# Patient Record
Sex: Female | Born: 1940 | Race: White | Hispanic: No | Marital: Married | Smoking: Former smoker
Health system: Southern US, Community
[De-identification: ages and names within clinical notes are randomized; demographics above are authoritative.]

## PROBLEM LIST (undated history)

## (undated) DIAGNOSIS — I509 Heart failure, unspecified: Secondary | ICD-10-CM

## (undated) DIAGNOSIS — M791 Myalgia, unspecified site: Secondary | ICD-10-CM

## (undated) DIAGNOSIS — D649 Anemia, unspecified: Secondary | ICD-10-CM

## (undated) DIAGNOSIS — I499 Cardiac arrhythmia, unspecified: Secondary | ICD-10-CM

## (undated) DIAGNOSIS — F329 Major depressive disorder, single episode, unspecified: Secondary | ICD-10-CM

## (undated) DIAGNOSIS — R609 Edema, unspecified: Secondary | ICD-10-CM

## (undated) DIAGNOSIS — N189 Chronic kidney disease, unspecified: Secondary | ICD-10-CM

## (undated) DIAGNOSIS — F32A Depression, unspecified: Secondary | ICD-10-CM

## (undated) DIAGNOSIS — Z87448 Personal history of other diseases of urinary system: Secondary | ICD-10-CM

## (undated) DIAGNOSIS — IMO0001 Reserved for inherently not codable concepts without codable children: Secondary | ICD-10-CM

## (undated) DIAGNOSIS — M810 Age-related osteoporosis without current pathological fracture: Secondary | ICD-10-CM

## (undated) DIAGNOSIS — K219 Gastro-esophageal reflux disease without esophagitis: Secondary | ICD-10-CM

## (undated) DIAGNOSIS — I10 Essential (primary) hypertension: Secondary | ICD-10-CM

## (undated) DIAGNOSIS — R413 Other amnesia: Secondary | ICD-10-CM

## (undated) DIAGNOSIS — R0981 Nasal congestion: Secondary | ICD-10-CM

## (undated) HISTORY — DX: Anemia, unspecified: D64.9

## (undated) HISTORY — DX: Nasal congestion: R09.81

## (undated) HISTORY — DX: Cardiac arrhythmia, unspecified: I49.9

## (undated) HISTORY — PX: OTHER SURGICAL HISTORY: SHX169

## (undated) HISTORY — DX: Depression, unspecified: F32.A

## (undated) HISTORY — DX: Major depressive disorder, single episode, unspecified: F32.9

## (undated) HISTORY — DX: Reserved for inherently not codable concepts without codable children: IMO0001

## (undated) HISTORY — DX: Personal history of other diseases of urinary system: Z87.448

## (undated) HISTORY — DX: Gastro-esophageal reflux disease without esophagitis: K21.9

## (undated) HISTORY — DX: Heart failure, unspecified: I50.9

## (undated) HISTORY — DX: Age-related osteoporosis without current pathological fracture: M81.0

## (undated) HISTORY — DX: Essential (primary) hypertension: I10

## (undated) HISTORY — DX: Myalgia, unspecified site: M79.10

## (undated) HISTORY — DX: Chronic kidney disease, unspecified: N18.9

## (undated) HISTORY — DX: Other amnesia: R41.3

## (undated) HISTORY — DX: Edema, unspecified: R60.9

## (undated) HISTORY — PX: KNEE SURGERY: SHX244

---

## 2008-05-29 ENCOUNTER — Encounter: Admission: RE | Admit: 2008-05-29 | Discharge: 2008-05-29 | Payer: Self-pay | Admitting: Otolaryngology

## 2008-05-30 ENCOUNTER — Other Ambulatory Visit: Admission: RE | Admit: 2008-05-30 | Discharge: 2008-05-30 | Payer: Self-pay | Admitting: Otolaryngology

## 2008-08-08 ENCOUNTER — Encounter (INDEPENDENT_AMBULATORY_CARE_PROVIDER_SITE_OTHER): Payer: Self-pay | Admitting: Otolaryngology

## 2008-08-08 ENCOUNTER — Ambulatory Visit (HOSPITAL_COMMUNITY): Admission: RE | Admit: 2008-08-08 | Discharge: 2008-08-09 | Payer: Self-pay | Admitting: Otolaryngology

## 2010-06-17 LAB — APTT: aPTT: 41 seconds — ABNORMAL HIGH (ref 24–37)

## 2010-06-17 LAB — PROTIME-INR: Prothrombin Time: 12.9 seconds (ref 11.6–15.2)

## 2010-06-17 LAB — CBC
Platelets: 250 10*3/uL (ref 150–400)
RDW: 13.2 % (ref 11.5–15.5)
WBC: 8.1 10*3/uL (ref 4.0–10.5)

## 2010-06-17 LAB — BASIC METABOLIC PANEL
BUN: 14 mg/dL (ref 6–23)
Calcium: 9.7 mg/dL (ref 8.4–10.5)
Creatinine, Ser: 0.77 mg/dL (ref 0.4–1.2)
GFR calc non Af Amer: >60 mL/min (ref >60)

## 2010-07-22 NOTE — Op Note (Signed)
NAME:  Rebecca Gilmore, Rebecca Gilmore NO.:  [id number]   MEDICAL RECORD NO.:  [id number]          PATIENT TYPE:  OIB   LOCATION:  4128                         FACILITY:  [facility]   PHYSICIAN:  Berneice Heinrich, M.D., F.A.C.S.DATE OF BIRTH:  [birth year]   DATE OF PROCEDURE:  08/08/2008  DATE OF DISCHARGE:  08/09/2008                               OPERATIVE REPORT   PREOPERATIVE DIAGNOSIS:  Right parotid mass, ICD-9 is 784.2.   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   PROCEDURE:  Right superficial parotidectomy with facial nerve dissection  and nerve integrity monitoring, 424.15.   SURGEON:  Silvestre Moment, MD   ANESTHESIA:  General endotracheal anesthesia.   ESTIMATED BLOOD LOSS:  Less than 50 mL.   SPECIMENS:  Right superficial parotid gland with frozen section.   COMPLICATIONS:  None.   INDICATIONS:  This patient is a 70 year old female with a 17-year  history of a progressively enlarging right parotid tail mass.  Fine  needle aspiration performed on May 30, 2008, was consistent with a  pleomorphic adenoma.  The patient also underwent a CT scan with contrast  on May 29, 2008.  This revealed findings of a large right parotid  tumor with primarily superficial by involvement, but some possible deep  lobe involvement as well.  The mass was approximately 3 cm.  No  lymphadenopathy is associated.  No signs of malignancy.  Facial nerve  function is intact.  Moderate pain is associated and there is some  facial enlargement which is causing asymmetry which concerns the patient  as well.  For these reasons, right parotidectomy is performed today.   FINDINGS:  The patient had friable large tumor overlying primarily the  inferior trunk of the facial nerve.  The facial nerve did split the  tumor.  There was some dissection on the inferior surface of the facial  nerve.  There was a small amount of the parotid that required dissection  of superior trunk as  well.  All branches stimulated at the end of the  case was 0.5 mA of current.  The frozen section was returned as  pleomorphic adenoma.   PROCEDURE:  The patient was taken to the operating room and placed on  the table in the supine position.  She was then placed under general  endotracheal anesthesia and the table rotated counterclockwise 90  degrees.  The nerve integrity monitor was applied as per standard  fashion.  Resistances were within expected levels.  The right side of  face was prepped and draped in the usual sterile fashion.  Lidocaine 1%  with 1:100,000 epinephrine was then used to inject the planned area of  incision.  After allowing time for vasoconstrictive effect, right  modified Blair incision was made with a small 2-cm limb following  natural skin crease approximately 2 fingerbreadths inferior to the lower  border of the mandible.  At this point, #15 blade was then used to  follow the planned incision.  SMAS flap was then elevated using Bovie  cautery and thyroid scissors.  The anterior flap was then secured in a  simple interrupted fashion using 0 silk suture.  Parotid gland was then  dissected free off of the tragal and ear cartilage using Bovie cautery  until approaching the facial nerve.  The ear and ear lobe and skin was  then again retracted posteriorly and sutured with 0 silk suture.  The  facial nerve was then carefully identified at the tympanomastoid suture  line using this as a landmark.  Bipolar cautery was used to cauterize  vessels in this area.  The McCabe dissecting forceps were then used to  elevate all of the facial nerve and carefully tract the inferior and  superior main trunk with bipolar cautery used to divide the parotid  gland superficial to this.  Successive division of parotid gland was  performed with all branches of the facial nerve being elevated in this  manner and protected in entirety.  There were a few fiber branches of  the nerve that  could not be dissected without damaging.  All other nerve  branches were protected in entirety.  The Stensen duct was identified  and tied off using 0 silk suture.  The majority of the tumor was along  the marginal mandibular nerve which did split the tumor.  It was  carefully elevated from an inferior to superior manner and dissected off  and taken off in its entirety from the lower deep lobe of the parotid  gland.  The main trunk was then stimulated using 0.5 mA of current with  all branches stimulating equally.  Please note that during the  dissection prior to the identification of the main trunk, the  sternocleidomastoid muscle was divided from the attachments of the  inferior border of the parotid gland, then continuing in that manner,  the digastric muscle was identified and dissected from the inferior  border of the parotid gland as well.  This then opened up a large plane  of dissection for safely dissecting out the facial nerve trunk.  The  nerve integrity monitor was used to ensure careful dissection in the  small branches particularly in the marginal mandibular nerve region.  Otherwise, the main trunk was easily visible and the stimulator was used  at the final prior to closure of the skin flap.  A #7 drain was placed  deep in the parotid bed and brought out through a separate stab incision  in the posterior skin.  This was tied using a pursestring fashion with 3-  0 silk suture.  The subcutaneous tissues were reapproximated in a simple  interrupted buried fashion using 3-0 Vicryl.  The skin was closed in a  running subcuticular fashion using 4-0 Vicryl.  Bacitracin ointment was  applied.  The drain was connected to suction.  The table was rotated  clockwise 90 degrees to its original position.  The patient was awakened  from anesthesia and taken to the postanesthesia care unit in stable  condition.  There were no complications.   Note, prior to closure of the wound, frozen  section was returned as  pleomorphic adenoma, and for this reason, no dissection of the deep lobe  was required and the wound was then closed.  Please note the copious  irrigation of the dissected bed was performed prior to closure.      Berneice Heinrich, M.D., F.A.C.S.  Electronically Signed     SJ/MEDQ  D:  08/19/2008  T:  08/20/2008  Job:  [id number]   cc:   Daiva Eves, MD  Berneice Heinrich, M.D., F.A.C.S.

## 2012-05-11 ENCOUNTER — Ambulatory Visit: Payer: Self-pay | Admitting: Ophthalmology

## 2012-05-11 LAB — POTASSIUM: Potassium: 3.8 mmol/L (ref 3.5–5.1)

## 2012-05-17 ENCOUNTER — Ambulatory Visit: Payer: Self-pay | Admitting: Ophthalmology

## 2012-06-06 ENCOUNTER — Ambulatory Visit: Payer: Self-pay | Admitting: Ophthalmology

## 2012-06-06 LAB — POTASSIUM: Potassium: 3.8 mmol/L (ref 3.5–5.1)

## 2012-06-15 ENCOUNTER — Ambulatory Visit: Payer: Self-pay | Admitting: Ophthalmology

## 2012-12-06 ENCOUNTER — Encounter: Payer: Self-pay | Admitting: *Deleted

## 2012-12-08 ENCOUNTER — Encounter: Payer: Self-pay | Admitting: *Deleted

## 2012-12-12 ENCOUNTER — Encounter: Payer: Self-pay | Admitting: Podiatry

## 2012-12-12 ENCOUNTER — Ambulatory Visit (INDEPENDENT_AMBULATORY_CARE_PROVIDER_SITE_OTHER): Payer: Medicare Other | Admitting: Podiatry

## 2012-12-12 VITALS — BP 115/58 | HR 66 | Temp 98.0°F | Resp 16 | Ht 64.0 in | Wt 239.2 lb

## 2012-12-12 DIAGNOSIS — M722 Plantar fascial fibromatosis: Secondary | ICD-10-CM | POA: Insufficient documentation

## 2012-12-12 MED ORDER — DEXAMETHASONE SOD PHOSPHATE PF 10 MG/ML IJ SOLN: 2.0000 mg | Freq: Once | INTRAMUSCULAR | Status: DC

## 2012-12-12 MED ORDER — TRIAMCINOLONE ACETONIDE 40 MG/ML IJ SUSP: 20.0000 mg | Freq: Once | INTRAMUSCULAR | Status: DC

## 2012-12-12 NOTE — Patient Instructions (Addendum)
Plantar Fasciitis (Heel Spur Syndrome) with Rehab The plantar fascia is a fibrous, ligament-like, soft-tissue structure that spans the bottom of the foot. Plantar fasciitis is a condition that causes pain in the foot due to inflammation of the tissue. SYMPTOMS   Pain and tenderness on the underneath side of the foot.  Pain that worsens with standing or walking. CAUSES  Plantar fasciitis is caused by irritation and injury to the plantar fascia on the underneath side of the foot. Common mechanisms of injury include:  Direct trauma to bottom of the foot.  Damage to a small nerve that runs under the foot where the main fascia attaches to the heel bone.  Stress placed on the plantar fascia due to bone spurs. RISK INCREASES WITH:   Activities that place stress on the plantar fascia (running, jumping, pivoting, or cutting).  Poor strength and flexibility.  Improperly fitted shoes.  Tight calf muscles.  Flat feet.  Failure to warm-up properly before activity.  Obesity. PREVENTION  Warm up and stretch properly before activity.  Allow for adequate recovery between workouts.  Maintain physical fitness:  Strength, flexibility, and endurance.  Cardiovascular fitness.  Maintain a health body weight.  Avoid stress on the plantar fascia.  Wear properly fitted shoes, including arch supports for individuals who have flat feet. PROGNOSIS  If treated properly, then the symptoms of plantar fasciitis usually resolve without surgery. However, occasionally surgery is necessary. RELATED COMPLICATIONS   Recurrent symptoms that may result in a chronic condition.  Problems of the lower back that are caused by compensating for the injury, such as limping.  Pain or weakness of the foot during push-off following surgery.  Chronic inflammation, scarring, and partial or complete fascia tear, occurring more often from repeated injections. TREATMENT  Treatment initially involves the use of  ice and medication to help reduce pain and inflammation. The use of strengthening and stretching exercises may help reduce pain with activity, especially stretches of the Achilles tendon. These exercises may be performed at home or with a therapist. Your caregiver may recommend that you use heel cups of arch supports to help reduce stress on the plantar fascia. Occasionally, corticosteroid injections are given to reduce inflammation. If symptoms persist for greater than 6 months despite non-surgical (conservative), then surgery may be recommended.  MEDICATION   If pain medication is necessary, then nonsteroidal anti-inflammatory medications, such as aspirin and ibuprofen, or other minor pain relievers, such as acetaminophen, are often recommended.  Do not take pain medication within 7 days before surgery.  Prescription pain relievers may be given if deemed necessary by your caregiver. Use only as directed and only as much as you need.  Corticosteroid injections may be given by your caregiver. These injections should be reserved for the most serious cases, because they may only be given a certain number of times. HEAT AND COLD  Cold treatment (icing) relieves pain and reduces inflammation. Cold treatment should be applied for 10 to 15 minutes every 2 to 3 hours for inflammation and pain and immediately after any activity that aggravates your symptoms. Use ice packs or massage the area with a piece of ice (ice massage).  Heat treatment may be used prior to performing the stretching and strengthening activities prescribed by your caregiver, physical therapist, or athletic trainer. Use a heat pack or soak the injury in warm water. SEEK IMMEDIATE MEDICAL CARE IF:  Treatment seems to offer no benefit, or the condition worsens.  Any medications produce adverse side effects. EXERCISES RANGE  OF MOTION (ROM) AND STRETCHING EXERCISES - Plantar Fasciitis (Heel Spur Syndrome) These exercises may help you  when beginning to rehabilitate your injury. Your symptoms may resolve with or without further involvement from your physician, physical therapist or athletic trainer. While completing these exercises, remember:   Restoring tissue flexibility helps normal motion to return to the joints. This allows healthier, less painful movement and activity.  An effective stretch should be held for at least 30 seconds.  A stretch should never be painful. You should only feel a gentle lengthening or release in the stretched tissue. RANGE OF MOTION - Toe Extension, Flexion  Sit with your right / left leg crossed over your opposite knee.  Grasp your toes and gently pull them back toward the top of your foot. You should feel a stretch on the bottom of your toes and/or foot.  Hold this stretch for __________ seconds.  Now, gently pull your toes toward the bottom of your foot. You should feel a stretch on the top of your toes and or foot.  Hold this stretch for __________ seconds. Repeat __________ times. Complete this stretch __________ times per day.  RANGE OF MOTION - Ankle Dorsiflexion, Active Assisted  Remove shoes and sit on a chair that is preferably not on a carpeted surface.  Place right / left foot under knee. Extend your opposite leg for support.  Keeping your heel down, slide your right / left foot back toward the chair until you feel a stretch at your ankle or calf. If you do not feel a stretch, slide your bottom forward to the edge of the chair, while still keeping your heel down.  Hold this stretch for __________ seconds. Repeat __________ times. Complete this stretch __________ times per day.  STRETCH  Gastroc, Standing  Place hands on wall.  Extend right / left leg, keeping the front knee somewhat bent.  Slightly point your toes inward on your back foot.  Keeping your right / left heel on the floor and your knee straight, shift your weight toward the wall, not allowing your back to  arch.  You should feel a gentle stretch in the right / left calf. Hold this position for __________ seconds. Repeat __________ times. Complete this stretch __________ times per day. STRETCH  Soleus, Standing  Place hands on wall.  Extend right / left leg, keeping the other knee somewhat bent.  Slightly point your toes inward on your back foot.  Keep your right / left heel on the floor, bend your back knee, and slightly shift your weight over the back leg so that you feel a gentle stretch deep in your back calf.  Hold this position for __________ seconds. Repeat __________ times. Complete this stretch __________ times per day. STRETCH  Gastrocsoleus, Standing  Note: This exercise can place a lot of stress on your foot and ankle. Please complete this exercise only if specifically instructed by your caregiver.   Place the ball of your right / left foot on a step, keeping your other foot firmly on the same step.  Hold on to the wall or a rail for balance.  Slowly lift your other foot, allowing your body weight to press your heel down over the edge of the step.  You should feel a stretch in your right / left calf.  Hold this position for __________ seconds.  Repeat this exercise with a slight bend in your right / left knee. Repeat __________ times. Complete this stretch __________ times per day.  STRENGTHENING EXERCISES - Plantar Fasciitis (Heel Spur Syndrome)  These exercises may help you when beginning to rehabilitate your injury. They may resolve your symptoms with or without further involvement from your physician, physical therapist or athletic trainer. While completing these exercises, remember:   Muscles can gain both the endurance and the strength needed for everyday activities through controlled exercises.  Complete these exercises as instructed by your physician, physical therapist or athletic trainer. Progress the resistance and repetitions only as guided. STRENGTH - Towel  Curls  Sit in a chair positioned on a non-carpeted surface.  Place your foot on a towel, keeping your heel on the floor.  Pull the towel toward your heel by only curling your toes. Keep your heel on the floor.  If instructed by your physician, physical therapist or athletic trainer, add ____________________ at the end of the towel. Repeat __________ times. Complete this exercise __________ times per day. STRENGTH - Ankle Inversion  Secure one end of a rubber exercise band/tubing to a fixed object (table, pole). Loop the other end around your foot just before your toes.  Place your fists between your knees. This will focus your strengthening at your ankle.  Slowly, pull your big toe up and in, making sure the band/tubing is positioned to resist the entire motion.  Hold this position for __________ seconds.  Have your muscles resist the band/tubing as it slowly pulls your foot back to the starting position. Repeat __________ times. Complete this exercises __________ times per day.  Document Released: 02/23/2005 Document Revised: 05/18/2011 Document Reviewed: 06/07/2008 [facility] Patient Information 2014 [place], [place]. Plantar Fasciitis Plantar fasciitis is a common condition that causes foot pain. It is soreness (inflammation) of the band of tough fibrous tissue on the bottom of the foot that runs from the heel bone (calcaneus) to the ball of the foot. The cause of this soreness may be from excessive standing, poor fitting shoes, running on hard surfaces, being overweight, having an abnormal walk, or overuse (this is common in runners) of the painful foot or feet. It is also common in aerobic exercise dancers and ballet dancers. SYMPTOMS  Most people with plantar fasciitis complain of:  Severe pain in the morning on the bottom of their foot especially when taking the first steps out of bed. This pain recedes after a few minutes of walking.  Severe pain is experienced also during walking  following a long period of inactivity.  Pain is worse when walking barefoot or up stairs DIAGNOSIS   Your caregiver will diagnose this condition by examining and feeling your foot.  Special tests such as X-rays of your foot, are usually not needed. PREVENTION   Consult a sports medicine professional before beginning a new exercise program.  Walking programs offer a good workout. With walking there is a lower chance of overuse injuries common to runners. There is less impact and less jarring of the joints.  Begin all new exercise programs slowly. If problems or pain develop, decrease the amount of time or distance until you are at a comfortable level.  Wear good shoes and replace them regularly.  Stretch your foot and the heel cords at the back of the ankle (Achilles tendon) both before and after exercise.  Run or exercise on even surfaces that are not hard. For example, asphalt is better than pavement.  Do not run barefoot on hard surfaces.  If using a treadmill, vary the incline.  Do not continue to workout if you have foot or joint  problems. Seek professional help if they do not improve. HOME CARE INSTRUCTIONS   Avoid activities that cause you pain until you recover.  Use ice or cold packs on the problem or painful areas after working out.  Only take over-the-counter or prescription medicines for pain, discomfort, or fever as directed by your caregiver.  Soft shoe inserts or athletic shoes with air or gel sole cushions may be helpful.  If problems continue or become more severe, consult a sports medicine caregiver or your own health care provider. Cortisone is a potent anti-inflammatory medication that may be injected into the painful area. You can discuss this treatment with your caregiver. MAKE SURE YOU:   Understand these instructions.  Will watch your condition.  Will get help right away if you are not doing well or get worse. Document Released: 11/18/2000 Document  Revised: 05/18/2011 Document Reviewed: 01/18/2008 [facility] Patient Information 2014 [place], [place].

## 2012-12-12 NOTE — Progress Notes (Signed)
Rebecca Gilmore presents today with a chief complaint of pain to her forefoot right and her lateral foot right. States that she is also recently noted some pain in her right heel. Care recall how long it's been going on. States it is painful when she stands up first thing in the mornings. She is also complaining of painful area beneath the fourth metatarsal of her right foot.  Objective: I reviewed her past history medications allergies. Her pulses are strongly palpable to the bilateral lower extremity with mild edema. She has a plantar flexed fourth metatarsal of the right foot resulting in her metatarsalgia. A lateral metatarsalgia is more than likely compensatory to plantar fasciitis. He does have pain on palpation to the medial calcaneal tubercle of the right heel.  Assessment: Plantar fasciitis with lateral compensatory syndrome. She also has a fourth plantarflexed metatarsal.  Plan: At this point I injected her right heel with me milligrams of Kenalog and 2 mg of dexamethasone to the point of maximal tenderness right heel. I will followup with her in 2 months.

## 2013-02-13 ENCOUNTER — Ambulatory Visit (INDEPENDENT_AMBULATORY_CARE_PROVIDER_SITE_OTHER): Payer: Medicare Other | Admitting: Podiatry

## 2013-02-13 ENCOUNTER — Encounter: Payer: Self-pay | Admitting: Podiatry

## 2013-02-13 VITALS — BP 134/51 | HR 61 | Resp 16

## 2013-02-13 DIAGNOSIS — M7750 Other enthesopathy of unspecified foot: Secondary | ICD-10-CM

## 2013-02-13 NOTE — Progress Notes (Signed)
Rebecca Gilmore presents today with a chief complaint of a painful subtalar joint of the right foot she states this been hurting everyday until today. She states has been staying swollen and painful but she refuses to wear her compression hose.  Objective: Vital signs are stable she is alert and oriented x3. Pulses are strong bilateral. She has pain on range of motion of the subtalar joint of the right foot. Pain on palpation of the sinus tarsi right foot.  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  Plan: Injected the subtalar joint today with 20 mg of local anesthetic and Kenalog.

## 2013-04-17 ENCOUNTER — Ambulatory Visit: Payer: Medicare Other | Admitting: Podiatry

## 2014-01-24 ENCOUNTER — Ambulatory Visit (INDEPENDENT_AMBULATORY_CARE_PROVIDER_SITE_OTHER): Payer: Medicare Other | Admitting: Podiatry

## 2014-01-24 VITALS — BP 134/66 | HR 71 | Resp 16

## 2014-01-24 DIAGNOSIS — M7752 Other enthesopathy of left foot: Secondary | ICD-10-CM

## 2014-01-24 DIAGNOSIS — M779 Enthesopathy, unspecified: Principal | ICD-10-CM

## 2014-01-24 DIAGNOSIS — M7751 Other enthesopathy of right foot: Secondary | ICD-10-CM

## 2014-01-24 DIAGNOSIS — M778 Other enthesopathies, not elsewhere classified: Secondary | ICD-10-CM

## 2014-01-24 NOTE — Progress Notes (Signed)
She presents today complaining of painful subtalar joints bilateral.  Objective: She has pain on palpation sinus tarsi and subtalar joint range of motion bilateral foot.  Assessment: Subtalar joint capsulitis bilateral.  Plan: Injected Kenalog and local anesthetic to the bilateral subtalar joints.

## 2014-02-05 ENCOUNTER — Ambulatory Visit: Payer: Medicare Other | Admitting: Podiatry

## 2014-05-08 DIAGNOSIS — M25551 Pain in right hip: Secondary | ICD-10-CM | POA: Diagnosis not present

## 2014-05-08 DIAGNOSIS — M1711 Unilateral primary osteoarthritis, right knee: Secondary | ICD-10-CM | POA: Diagnosis not present

## 2014-06-29 NOTE — Op Note (Signed)
PATIENT NAME:  Rebecca Gilmore, SAVANNAH MR#:  [id number] DATE OF BIRTH:  [birth year]  DATE OF PROCEDURE:  05/17/2012  PREOPERATIVE DIAGNOSIS: Visually significant cataract of the right eye.   POSTOPERATIVE DIAGNOSIS: Visually significant cataract of the right eye.   OPERATIVE PROCEDURE: Cataract extraction by phacoemulsification with implant of intraocular lens to right eye.   SURGEON: Birder Robson, MD.   ANESTHESIA:  1. Managed anesthesia care.  2. Topical tetracaine drops followed by 2% Xylocaine jelly applied in the preoperative holding area.   COMPLICATIONS: None.   TECHNIQUE:  Stop and chop.   DESCRIPTION OF PROCEDURE: The patient was examined and consented in the preoperative holding area where the aforementioned topical anesthesia was applied to the right eye and then brought back to the Operating Room where the right eye was prepped and draped in the usual sterile ophthalmic fashion and a lid speculum was placed. A paracentesis was created with the side port blade and the anterior chamber was filled with viscoelastic. A near clear corneal incision was performed with the steel keratome. A continuous curvilinear capsulorrhexis was performed with a cystotome followed by the capsulorrhexis forceps. Hydrodissection and hydrodelineation were carried out with BSS on a blunt cannula. The lens was removed in a stop and chop technique and the remaining cortical material was removed with the irrigation-aspiration handpiece. The capsular bag was inflated with viscoelastic and the Tecnis ZCB00 23.5 -diopter lens, serial #[id number] was placed in the capsular bag without complication. The remaining viscoelastic was removed from the eye with the irrigation-aspiration handpiece. The wounds were hydrated. The anterior chamber was flushed with Miostat and the eye was inflated to physiologic pressure. 0.1 mL of cefuroxime concentration 10 mg/mL was placed in the anterior chamber. The wounds were found to be water  tight. The eye was dressed with Vigamox. The patient was given protective glasses to wear throughout the day and a shield with which to sleep tonight. The patient was also given drops with which to begin a drop regimen today and will follow-up with me in one day.      ____________________________ Livingston Diones. Porfilio, MD wlp:cc D: 05/17/2012 21:53:06 ET T: 05/17/2012 23:24:02 ET JOB#: [id number]  cc: William L. Porfilio, MD, <Dictator> [name] MD ELECTRONICALLY SIGNED 05/20/2012 17:10

## 2014-06-29 NOTE — Op Note (Signed)
PATIENT NAME:  Rebecca Gilmore, Rebecca Gilmore MR#:  [id number] DATE OF BIRTH:  April 06, 1940  DATE OF PROCEDURE:  06/15/2012  PREOPERATIVE DIAGNOSIS: Visually significant cataract of the left eye.   POSTOPERATIVE DIAGNOSIS: Visually significant cataract of the left eye.   OPERATIVE PROCEDURE: Cataract extraction by phacoemulsification with implant of intraocular lens to left eye.   SURGEON: Birder Robson, MD.   ANESTHESIA:  1. Managed anesthesia care.  2. Topical tetracaine drops followed by 2% Xylocaine jelly applied in the preoperative holding area.   COMPLICATIONS: None.   TECHNIQUE:  Stop and chop.   DESCRIPTION OF PROCEDURE: The patient was examined and consented in the preoperative holding area where the aforementioned topical anesthesia was applied to the left eye and then brought back to the Operating Room where the left eye was prepped and draped in the usual sterile ophthalmic fashion and a lid speculum was placed. A paracentesis was created with the side port blade and the anterior chamber was filled with viscoelastic. A near clear corneal incision was performed with the steel keratome. A continuous curvilinear capsulorrhexis was performed with a cystotome followed by the capsulorrhexis forceps. Hydrodissection and hydrodelineation were carried out with BSS on a blunt cannula. The lens was removed in a stop and chop technique and the remaining cortical material was removed with the irrigation-aspiration handpiece. The capsular bag was inflated with viscoelastic and the Tecnis ZCB00 24.5-diopter lens, serial number [id number] was placed in the capsular bag without complication. The remaining viscoelastic was removed from the eye with the irrigation-aspiration handpiece. The wounds were hydrated. The anterior chamber was flushed with Miostat and the eye was inflated to physiologic pressure. 0.1 mL of cefuroxime concentration 10 mg/mL was placed in the anterior chamber. The wounds were found to be water  tight. The eye was dressed with Vigamox and Combigan. The patient was given protective glasses to wear throughout the day and a shield with which to sleep tonight. The patient was also given drops with which to begin a drop regimen today and will follow-up with me in one day.    ____________________________ Livingston Diones. Kwesi Sangha, MD wlp:sb D: 06/15/2012 11:10:27 ET T: 06/15/2012 11:20:37 ET JOB#: [id number]  cc: Blanche Gallien L. Graclynn Vanantwerp, MD, <Dictator> Livingston Diones Dennies Coate MD ELECTRONICALLY SIGNED 06/21/2012 12:51

## 2014-07-10 DIAGNOSIS — Z1389 Encounter for screening for other disorder: Secondary | ICD-10-CM | POA: Diagnosis not present

## 2014-07-10 DIAGNOSIS — E782 Mixed hyperlipidemia: Secondary | ICD-10-CM | POA: Diagnosis not present

## 2014-07-10 DIAGNOSIS — I10 Essential (primary) hypertension: Secondary | ICD-10-CM | POA: Diagnosis not present

## 2014-07-10 DIAGNOSIS — E559 Vitamin D deficiency, unspecified: Secondary | ICD-10-CM | POA: Diagnosis not present

## 2014-07-10 DIAGNOSIS — N183 Chronic kidney disease, stage 3 (moderate): Secondary | ICD-10-CM | POA: Diagnosis not present

## 2014-07-10 DIAGNOSIS — R7309 Other abnormal glucose: Secondary | ICD-10-CM | POA: Diagnosis not present

## 2014-07-10 DIAGNOSIS — Z9181 History of falling: Secondary | ICD-10-CM | POA: Diagnosis not present

## 2014-08-14 DIAGNOSIS — S83206A Unspecified tear of unspecified meniscus, current injury, right knee, initial encounter: Secondary | ICD-10-CM | POA: Diagnosis not present

## 2014-08-14 DIAGNOSIS — M25561 Pain in right knee: Secondary | ICD-10-CM | POA: Diagnosis not present

## 2014-08-15 ENCOUNTER — Other Ambulatory Visit: Payer: Self-pay | Admitting: Orthopedic Surgery

## 2014-08-15 DIAGNOSIS — M25561 Pain in right knee: Secondary | ICD-10-CM

## 2014-08-26 ENCOUNTER — Ambulatory Visit
Admission: RE | Admit: 2014-08-26 | Discharge: 2014-08-26 | Disposition: A | Discharge status: Home / Self Care | Payer: Medicare Other | Source: Ambulatory Visit | Attending: Orthopedic Surgery | Admitting: Orthopedic Surgery

## 2014-08-26 DIAGNOSIS — M25561 Pain in right knee: Secondary | ICD-10-CM

## 2014-08-26 DIAGNOSIS — M1711 Unilateral primary osteoarthritis, right knee: Secondary | ICD-10-CM | POA: Diagnosis not present

## 2014-08-26 DIAGNOSIS — M23341 Other meniscus derangements, anterior horn of lateral meniscus, right knee: Secondary | ICD-10-CM | POA: Diagnosis not present

## 2014-08-26 DIAGNOSIS — M23321 Other meniscus derangements, posterior horn of medial meniscus, right knee: Secondary | ICD-10-CM | POA: Diagnosis not present

## 2014-09-26 DIAGNOSIS — S83231A Complex tear of medial meniscus, current injury, right knee, initial encounter: Secondary | ICD-10-CM | POA: Diagnosis not present

## 2014-09-26 DIAGNOSIS — M23341 Other meniscus derangements, anterior horn of lateral meniscus, right knee: Secondary | ICD-10-CM | POA: Diagnosis not present

## 2014-09-26 DIAGNOSIS — G8918 Other acute postprocedural pain: Secondary | ICD-10-CM | POA: Diagnosis not present

## 2014-09-26 DIAGNOSIS — M94261 Chondromalacia, right knee: Secondary | ICD-10-CM | POA: Diagnosis not present

## 2014-09-26 DIAGNOSIS — M6751 Plica syndrome, right knee: Secondary | ICD-10-CM | POA: Diagnosis not present

## 2014-09-26 DIAGNOSIS — M2241 Chondromalacia patellae, right knee: Secondary | ICD-10-CM | POA: Diagnosis not present

## 2014-09-26 DIAGNOSIS — S83251A Bucket-handle tear of lateral meniscus, current injury, right knee, initial encounter: Secondary | ICD-10-CM | POA: Diagnosis not present

## 2014-09-26 DIAGNOSIS — Y929 Unspecified place or not applicable: Secondary | ICD-10-CM | POA: Diagnosis not present

## 2014-09-26 DIAGNOSIS — M23321 Other meniscus derangements, posterior horn of medial meniscus, right knee: Secondary | ICD-10-CM | POA: Diagnosis not present

## 2014-09-26 DIAGNOSIS — X58XXXA Exposure to other specified factors, initial encounter: Secondary | ICD-10-CM | POA: Diagnosis not present

## 2014-10-02 DIAGNOSIS — R262 Difficulty in walking, not elsewhere classified: Secondary | ICD-10-CM | POA: Diagnosis not present

## 2014-10-02 DIAGNOSIS — M25661 Stiffness of right knee, not elsewhere classified: Secondary | ICD-10-CM | POA: Diagnosis not present

## 2014-10-02 DIAGNOSIS — M25561 Pain in right knee: Secondary | ICD-10-CM | POA: Diagnosis not present

## 2014-10-15 DIAGNOSIS — Z9889 Other specified postprocedural states: Secondary | ICD-10-CM | POA: Diagnosis not present

## 2014-11-01 DIAGNOSIS — M1711 Unilateral primary osteoarthritis, right knee: Secondary | ICD-10-CM | POA: Diagnosis not present

## 2014-11-22 DIAGNOSIS — M1711 Unilateral primary osteoarthritis, right knee: Secondary | ICD-10-CM | POA: Diagnosis not present

## 2015-01-08 DIAGNOSIS — N183 Chronic kidney disease, stage 3 (moderate): Secondary | ICD-10-CM | POA: Diagnosis not present

## 2015-01-08 DIAGNOSIS — Z1389 Encounter for screening for other disorder: Secondary | ICD-10-CM | POA: Diagnosis not present

## 2015-01-08 DIAGNOSIS — E559 Vitamin D deficiency, unspecified: Secondary | ICD-10-CM | POA: Diagnosis not present

## 2015-01-08 DIAGNOSIS — E782 Mixed hyperlipidemia: Secondary | ICD-10-CM | POA: Diagnosis not present

## 2015-01-08 DIAGNOSIS — I10 Essential (primary) hypertension: Secondary | ICD-10-CM | POA: Diagnosis not present

## 2015-01-08 DIAGNOSIS — R7303 Prediabetes: Secondary | ICD-10-CM | POA: Diagnosis not present

## 2015-02-14 DIAGNOSIS — M1711 Unilateral primary osteoarthritis, right knee: Secondary | ICD-10-CM | POA: Diagnosis not present

## 2015-02-14 DIAGNOSIS — M7521 Bicipital tendinitis, right shoulder: Secondary | ICD-10-CM | POA: Diagnosis not present

## 2015-05-06 DIAGNOSIS — Z0000 Encounter for general adult medical examination without abnormal findings: Secondary | ICD-10-CM | POA: Diagnosis not present

## 2015-05-06 DIAGNOSIS — K5909 Other constipation: Secondary | ICD-10-CM | POA: Diagnosis not present

## 2015-05-06 DIAGNOSIS — E669 Obesity, unspecified: Secondary | ICD-10-CM | POA: Diagnosis not present

## 2015-05-06 DIAGNOSIS — Z9181 History of falling: Secondary | ICD-10-CM | POA: Diagnosis not present

## 2015-05-14 DIAGNOSIS — M25561 Pain in right knee: Secondary | ICD-10-CM | POA: Diagnosis not present

## 2015-05-21 DIAGNOSIS — I83893 Varicose veins of bilateral lower extremities with other complications: Secondary | ICD-10-CM | POA: Diagnosis not present

## 2015-05-21 DIAGNOSIS — I8311 Varicose veins of right lower extremity with inflammation: Secondary | ICD-10-CM | POA: Diagnosis not present

## 2015-05-21 DIAGNOSIS — I8312 Varicose veins of left lower extremity with inflammation: Secondary | ICD-10-CM | POA: Diagnosis not present

## 2015-05-21 DIAGNOSIS — I83813 Varicose veins of bilateral lower extremities with pain: Secondary | ICD-10-CM | POA: Diagnosis not present

## 2015-05-27 DIAGNOSIS — I83813 Varicose veins of bilateral lower extremities with pain: Secondary | ICD-10-CM | POA: Diagnosis not present

## 2015-05-27 DIAGNOSIS — I8311 Varicose veins of right lower extremity with inflammation: Secondary | ICD-10-CM | POA: Diagnosis not present

## 2015-05-27 DIAGNOSIS — I8312 Varicose veins of left lower extremity with inflammation: Secondary | ICD-10-CM | POA: Diagnosis not present

## 2015-05-27 DIAGNOSIS — I83893 Varicose veins of bilateral lower extremities with other complications: Secondary | ICD-10-CM | POA: Diagnosis not present

## 2015-05-28 DIAGNOSIS — I8312 Varicose veins of left lower extremity with inflammation: Secondary | ICD-10-CM | POA: Diagnosis not present

## 2015-05-28 DIAGNOSIS — I83893 Varicose veins of bilateral lower extremities with other complications: Secondary | ICD-10-CM | POA: Diagnosis not present

## 2015-05-28 DIAGNOSIS — I8311 Varicose veins of right lower extremity with inflammation: Secondary | ICD-10-CM | POA: Diagnosis not present

## 2015-07-01 ENCOUNTER — Ambulatory Visit (INDEPENDENT_AMBULATORY_CARE_PROVIDER_SITE_OTHER): Payer: Medicare Other | Admitting: Podiatry

## 2015-07-01 ENCOUNTER — Ambulatory Visit (INDEPENDENT_AMBULATORY_CARE_PROVIDER_SITE_OTHER): Payer: Medicare Other

## 2015-07-01 ENCOUNTER — Encounter: Payer: Self-pay | Admitting: Podiatry

## 2015-07-01 VITALS — BP 119/68 | HR 68 | Resp 16

## 2015-07-01 DIAGNOSIS — M7750 Other enthesopathy of unspecified foot: Secondary | ICD-10-CM | POA: Diagnosis not present

## 2015-07-02 NOTE — Progress Notes (Signed)
She presents today with chief complaint of painful subtalar joints bilateral. She states that she has had a knee problem recently and has been unable to come for injections.  Objective: Vital signs are stable alert and oriented 3. Pulses are strongly palpable bilateral. She has pain on range of motion of subtalar joint with osteoarthritic changes.  Assessment: Capsulitis of subtalar joint and osteoarthritis.  Plan: Intra-articular injections of subtalar joint today with Kenalog and local anesthetic a total 20 mg was injected.

## 2015-07-10 DIAGNOSIS — Z79899 Other long term (current) drug therapy: Secondary | ICD-10-CM | POA: Diagnosis not present

## 2015-07-10 DIAGNOSIS — N183 Chronic kidney disease, stage 3 (moderate): Secondary | ICD-10-CM | POA: Diagnosis not present

## 2015-07-10 DIAGNOSIS — E782 Mixed hyperlipidemia: Secondary | ICD-10-CM | POA: Diagnosis not present

## 2015-07-10 DIAGNOSIS — I10 Essential (primary) hypertension: Secondary | ICD-10-CM | POA: Diagnosis not present

## 2015-07-10 DIAGNOSIS — Z139 Encounter for screening, unspecified: Secondary | ICD-10-CM | POA: Diagnosis not present

## 2015-07-10 DIAGNOSIS — R7303 Prediabetes: Secondary | ICD-10-CM | POA: Diagnosis not present

## 2015-07-10 DIAGNOSIS — E559 Vitamin D deficiency, unspecified: Secondary | ICD-10-CM | POA: Diagnosis not present

## 2015-09-03 DIAGNOSIS — M25512 Pain in left shoulder: Secondary | ICD-10-CM | POA: Diagnosis not present

## 2015-10-14 ENCOUNTER — Encounter: Payer: Self-pay | Admitting: Podiatry

## 2015-10-14 ENCOUNTER — Ambulatory Visit (INDEPENDENT_AMBULATORY_CARE_PROVIDER_SITE_OTHER): Payer: Medicare Other | Admitting: Podiatry

## 2015-10-14 VITALS — BP 111/68 | HR 72 | Resp 16

## 2015-10-14 DIAGNOSIS — M778 Other enthesopathies, not elsewhere classified: Secondary | ICD-10-CM

## 2015-10-14 DIAGNOSIS — M7751 Other enthesopathy of right foot: Secondary | ICD-10-CM

## 2015-10-14 DIAGNOSIS — M7752 Other enthesopathy of left foot: Secondary | ICD-10-CM | POA: Diagnosis not present

## 2015-10-14 DIAGNOSIS — M7750 Other enthesopathy of unspecified foot: Secondary | ICD-10-CM | POA: Diagnosis not present

## 2015-10-14 DIAGNOSIS — M779 Enthesopathy, unspecified: Secondary | ICD-10-CM

## 2015-10-14 NOTE — Progress Notes (Signed)
She presents today for a chief complaint of painful subtalar joint capsulitis. She like another injection.  Objective: Vital signs are stable she is alert and oriented 3. Pulses are palpable. There is no erythema cellulitis drainage or odor. She has pain on palpation and on an range of motion of the sinus tarsi and subtalar joints bilaterally.  Assessment: Capsulitis subtalar joint left foot. Subtalar joint capsulitis right foot.  Plan: Injected Kenalog 20 mg each sinus tarsi today with local anesthetic after sterile Betadine skin prep was achieved. Follow up with her in 3-4 months.

## 2015-10-16 ENCOUNTER — Ambulatory Visit: Payer: Medicare Other | Admitting: Podiatry

## 2016-01-20 ENCOUNTER — Encounter: Payer: Medicare Other | Admitting: Podiatry

## 2016-01-20 NOTE — Progress Notes (Signed)
   This encounter was created in error - please disregard.

## 2016-01-22 ENCOUNTER — Encounter: Payer: Self-pay | Admitting: Podiatry

## 2016-01-22 ENCOUNTER — Ambulatory Visit (INDEPENDENT_AMBULATORY_CARE_PROVIDER_SITE_OTHER): Payer: Medicare Other | Admitting: Podiatry

## 2016-01-22 DIAGNOSIS — M7750 Other enthesopathy of unspecified foot: Secondary | ICD-10-CM | POA: Diagnosis not present

## 2016-01-22 NOTE — Progress Notes (Signed)
She presents today for follow-up of pain to the sinus tarsi of the bilateral foot.  Objective: Vital signs are stable similar 3 simvastatin palpation sinus tarsi and on an range of motion of the subtalar joint bilaterally right limb.  Assessment: Subtalar joint abscess capsulitis and osteoarthritis.  Plan: Inject these areas today with 10 mg of Celestone Soluspan and local anesthetic.

## 2016-01-28 ENCOUNTER — Telehealth: Payer: Self-pay | Admitting: *Deleted

## 2016-01-28 MED ORDER — CELECOXIB 200 MG PO CAPS: 200.0000 mg | ORAL_CAPSULE | Freq: Two times a day (BID) | ORAL | 0 refills | Status: DC

## 2016-01-28 NOTE — Telephone Encounter (Addendum)
Pt states the medication Dr. Milinda Pointer changed her to last is not working as good as the other medication. I spoke to pt and she states the last injection didn't work as well and she is hurting in other places as well. Informed pt of Dr. Stephenie Acres orders.

## 2016-01-28 NOTE — Telephone Encounter (Signed)
Put on celebrex 254m bid #60

## 2016-01-29 ENCOUNTER — Telehealth: Payer: Self-pay | Admitting: Podiatry

## 2016-01-29 NOTE — Telephone Encounter (Signed)
Pt states she feels like jello inside, back and kidney pain and take all over pain. Pt states she still feels the new medicine was injected with is causing this. I told pt she may have a illness, and needs to go to her PCP. Pt states she didn't get the Celebrex she's had it before and it didn't help. Pt states she has nausea too. I told pt it sounded like she needed to be seen by her PCP she may have a problem not related to the injection or other medications. Pt agreed.

## 2016-01-29 NOTE — Telephone Encounter (Signed)
Pt called stating she can not take the med. That Dr. Milinda Pointer gave her.

## 2016-02-24 ENCOUNTER — Other Ambulatory Visit: Payer: Self-pay | Admitting: Podiatry

## 2016-02-25 NOTE — Telephone Encounter (Signed)
Pt needs an appt prior to future refills if continuing to have problems.

## 2016-03-25 ENCOUNTER — Ambulatory Visit: Payer: Medicare Other | Admitting: Podiatry

## 2016-04-06 ENCOUNTER — Ambulatory Visit (INDEPENDENT_AMBULATORY_CARE_PROVIDER_SITE_OTHER): Payer: Medicare Other | Admitting: Podiatry

## 2016-04-06 DIAGNOSIS — B351 Tinea unguium: Secondary | ICD-10-CM | POA: Diagnosis not present

## 2016-04-06 DIAGNOSIS — M7750 Other enthesopathy of unspecified foot: Secondary | ICD-10-CM | POA: Diagnosis not present

## 2016-04-06 DIAGNOSIS — M79676 Pain in unspecified toe(s): Secondary | ICD-10-CM | POA: Diagnosis not present

## 2016-04-06 MED ORDER — DICLOFENAC SODIUM 1 % TD GEL: 4.0000 g | Freq: Four times a day (QID) | TRANSDERMAL | 3 refills | Status: DC

## 2016-04-06 NOTE — Progress Notes (Signed)
She presents today with chief complaint of pain to the sinus tarsi of the bilateral foot. She states the last injection really did help at all. She is also complaining about a painful elongated toenails.  Objective: Vital signs are stable she is alert and oriented 3. Pulses are palpable. Still has severe pain in range of motion of subtalar joint bilateral. Toenails are long thick yellow dystrophic with mycotic and painful palpation as well as debridement.  Assessment: Sinus tarsitis subtalar joint capsulitis bilateral. Pain lives in her onychomycosis.  Plan: I started triamcinolone injections once again. Also wrote a prescription for diclofenac gel. Debridement toenails 1 through 5 bilateral.

## 2016-04-08 ENCOUNTER — Telehealth: Payer: Self-pay | Admitting: *Deleted

## 2016-04-08 NOTE — Telephone Encounter (Addendum)
Pt states the medication Dr. Milinda Pointer prescribed is a tier III and her insurance will not cover, will need to order tier I or II. 04/13/2016-Informed pt of Dr. Stephenie Acres orders and explained [place] [phone] process. Orders faxed to [facility].

## 2016-04-13 ENCOUNTER — Telehealth: Payer: Self-pay | Admitting: *Deleted

## 2016-04-13 MED ORDER — NONFORMULARY OR COMPOUNDED ITEM: 2 refills | Status: DC

## 2016-04-13 NOTE — Telephone Encounter (Signed)
Val please write antiinflammatory cream from [facility] for her.

## 2016-04-27 ENCOUNTER — Ambulatory Visit: Payer: Medicare Other | Admitting: Podiatry

## 2016-07-06 ENCOUNTER — Encounter: Payer: Self-pay | Admitting: Podiatry

## 2016-07-06 ENCOUNTER — Ambulatory Visit (INDEPENDENT_AMBULATORY_CARE_PROVIDER_SITE_OTHER): Payer: Medicare Other | Admitting: Podiatry

## 2016-07-06 DIAGNOSIS — M7750 Other enthesopathy of unspecified foot: Secondary | ICD-10-CM

## 2016-07-06 NOTE — Progress Notes (Signed)
She presents today with a chief complaint of painful capsulitis subtalar joints bilaterally. She states the last 2 injections really didn't do too much to help with the pain. She states that she's had to suffer through until she can see [place] today.  Objective: Vital signs are stable alert and oriented 3. Pulses are palpable. She has pain on end range of motion of the subtalar joints bilateral with edema to the right foot and leg. No cellulitis no open lesions or wounds are noted.  Assessment: Capsulitis of the subtalar joints bilateral.  Plan: Injected the subtalar joint today with Kenalog and local anesthetic 20 mg is injected into the subtalar joint. I will follow-up with her in a couple of months

## 2016-10-05 ENCOUNTER — Encounter: Payer: Self-pay | Admitting: Podiatry

## 2016-10-05 ENCOUNTER — Ambulatory Visit (INDEPENDENT_AMBULATORY_CARE_PROVIDER_SITE_OTHER): Payer: Medicare Other | Admitting: Podiatry

## 2016-10-05 DIAGNOSIS — M7750 Other enthesopathy of unspecified foot: Secondary | ICD-10-CM

## 2016-10-05 NOTE — Progress Notes (Signed)
She presents today to complaint of painful subtalar joint capsulitis.  Objective: Pulses are palpable. She has pain on end range of motion subtalar joint bilateral. She has pain on direct palpation of the sinus tarsi.  Assessment: Capsulitis subtalar joint bilateral.  Plan: Injected the area today with Kenalog and local anesthetic.

## 2016-11-23 ENCOUNTER — Other Ambulatory Visit (HOSPITAL_COMMUNITY): Payer: Self-pay | Admitting: Family Medicine

## 2016-11-23 ENCOUNTER — Other Ambulatory Visit: Payer: Self-pay | Admitting: Obstetrics and Gynecology

## 2016-11-23 ENCOUNTER — Other Ambulatory Visit: Payer: Self-pay | Admitting: Family Medicine

## 2016-11-23 DIAGNOSIS — G4453 Primary thunderclap headache: Secondary | ICD-10-CM

## 2016-11-24 ENCOUNTER — Other Ambulatory Visit: Payer: Self-pay | Admitting: Family Medicine

## 2016-11-24 DIAGNOSIS — G4453 Primary thunderclap headache: Secondary | ICD-10-CM

## 2016-11-26 ENCOUNTER — Encounter: Payer: Self-pay | Admitting: Vascular Surgery

## 2016-11-27 ENCOUNTER — Other Ambulatory Visit: Payer: Self-pay | Admitting: *Deleted

## 2016-11-27 ENCOUNTER — Encounter: Payer: Self-pay | Admitting: Vascular Surgery

## 2016-11-27 ENCOUNTER — Ambulatory Visit (INDEPENDENT_AMBULATORY_CARE_PROVIDER_SITE_OTHER): Payer: Medicare Other | Admitting: Vascular Surgery

## 2016-11-27 ENCOUNTER — Encounter: Payer: Self-pay | Admitting: *Deleted

## 2016-11-27 VITALS — BP 180/100 | HR 62 | Temp 97.1°F | Resp 18 | Ht 64.0 in | Wt 242.0 lb

## 2016-11-27 DIAGNOSIS — G441 Vascular headache, not elsewhere classified: Secondary | ICD-10-CM

## 2016-11-27 NOTE — Telephone Encounter (Signed)
Entered in error

## 2016-11-27 NOTE — Progress Notes (Signed)
  Requested by:  Hamrick, Maura L, MD [address] [place], [place] [address]  Reason for consultation: temporal arteritis    History of Present Illness   Rebecca Gilmore is a 76 y.o. (02/20/1941) female who presents with cc: headache.  Reportedly woke with "whole head" headache with severe temporal pain on 11/17/16.  She was seen in the ED and management as a migraine.  The patient denies any visual sx during this episode.  She denies any constitutional sx during this episode.  Her BP reportedly was in the 200s.  Her sx did not resolve and went back to the ED for further mgmt.  This time she was treated with steroids.  Reported her PCP has increased her steroid regimen already.  Reportedly this has reduced her sx 90%.   Past Medical History:  Diagnosis Date  . Depression   . HBP (high blood pressure)   . History of bladder problems   . Memory loss   . Muscle pain   . Osteoporosis   . Reflux   . Sinus congestion   . Swelling    B/L FEET AND LEGS    Past Surgical History:  Procedure Laterality Date  . CATARACT SURGERY    . EYELID SURGERY Bilateral   . GROWTH ON FACE    . KNEE SURGERY Right      Social History   Social History  . Marital status: Married    Spouse name: N/A  . Number of children: N/A  . Years of education: N/A   Occupational History  . Not on file.   Social History Main Topics  . Smoking status: Former Smoker    Types: Cigarettes    Quit date: 01/12/1993  . Smokeless tobacco: Former User    Types: Snuff, Chew     Comment: DATE QUIT UNKNOWN  . Alcohol use No  . Drug use: No  . Sexual activity: Not on file   Other Topics Concern  . Not on file   Social History Narrative  . No narrative on file    Family History  Problem Relation Age of Onset  . Heart disease Mother   . AAA (abdominal aortic aneurysm) Mother   . Heart disease Father     Current Outpatient Prescriptions  Medication Sig Dispense Refill  . Cholecalciferol (VITAMIN D  PO) Take by mouth daily.    . clonazePAM (KLONOPIN) 1 MG tablet Take 1 mg by mouth. 1/2 tab at bedtime    . fluocinonide gel (LIDEX) 0.05 % APPLY GEL TO ULCERS OR AFFECTED AREA 3 TIMES DAILY DURING FLARES  2  . FUROSEMIDE PO Take by mouth.    . losartan (COZAAR) 50 MG tablet Take 50 mg by mouth daily.    . meloxicam (MOBIC) 7.5 MG tablet     . vitamin B-12 (CYANOCOBALAMIN) 1000 MCG tablet Take 1,000 mcg by mouth as needed.    . predniSONE (DELTASONE) 20 MG tablet Take 60 mg by mouth daily. Take 3 Tablets Daily  0   Current Facility-Administered Medications  Medication Dose Route Frequency Provider Last Rate Last Dose  . dexamethasone Sod Phosphate PF SOLN 2 mg  2 mg Injection Once Hyatt, Max T, DPM      . triamcinolone acetonide (KENALOG-40) injection 20 mg  20 mg Intramuscular Once Hyatt, Max T, DPM        Allergies  Allergen Reactions  . Kenalog [Triamcinolone Acetonide] Other (See Comments)    FLUSH IN FACE  .   Penicillins     REVIEW OF SYSTEMS (negative unless checked):   Cardiac:  [x] Chest pain or chest pressure? [x] Shortness of breath upon activity? [] Shortness of breath when lying flat? [] Irregular heart rhythm?  Vascular:  [x] Pain in calf, thigh, or hip brought on by walking? [x] Pain in feet at night that wakes you up from your sleep? [] Blood clot in your veins? [x] Leg swelling?  Pulmonary:  [] Oxygen at home? [] Productive cough? [] Wheezing?  Neurologic:  [x] Sudden weakness in arms or legs? [x] Sudden numbness in arms or legs? [x] Sudden onset of difficult speaking or slurred speech? [] Temporary loss of vision in one eye? [] Problems with dizziness?  Gastrointestinal:  [] Blood in stool? [] Vomited blood?  Genitourinary:  [] Burning when urinating? [] Blood in urine?  Psychiatric:  [] Major depression  Hematologic:  [] Bleeding problems? [] Problems with blood clotting?  Dermatologic:  [x] Rashes or ulcers?  Constitutional:  [x]  Fever or chills?  Ear/Nose/Throat:  [] Change in hearing? [] Nose bleeds? [] Sore throat?  Musculoskeletal:  [] Back pain? [] Joint pain? [] Muscle pain?   For VQI Use Only   PRE-ADM LIVING Home  AMB STATUS Ambulatory  CAD Sx None  PRIOR CHF None  STRESS TEST No    Physical Examination     Vitals:   11/27/16 0913  BP: (!) 180/100  Pulse: 62  Resp: 18  Temp: (!) 97.1 F (36.2 C)  SpO2: 96%  Weight: 242 lb (109.8 kg)  Height: 5' 4" (1.626 m)   Body mass index is 41.54 kg/m.  General Alert, O x 3, Obese, NAD  Head [place]/AT,  no prominent pulse, no tenderness to palpation of either temporal region, somewhat asx facies  Ear/Nose/ Throat Hearing grossly intact, nares without erythema or drainage, oropharynx with  Erythema without Exudate, Mallampati score: 3,   Eyes PERRLA, EOMI,    Neck Supple, mid-line trachea,    Pulmonary Sym exp, good B air movt, CTA B  Cardiac RRR, Nl S1, S2, no Murmurs, No rubs, No S3,S4  Vascular Vessel Right Left  Radial Palpable Palpable  Brachial Palpable Palpable  Carotid Palpable, No Bruit Palpable, No Bruit  Aorta Not palpable N/A  Femoral Palpable Palpable  Popliteal Not palpable Not palpable  PT Not palpable Not palpable  DP Faintly palpable Faintly palpable    Gastro- intestinal soft, non-distended, non-tender to palpation, No guarding or rebound, no HSM, no masses, no CVAT B, No palpable prominent aortic pulse,    Musculo- skeletal M/S 5/5 throughout  , Extremities without ischemic changes  , Non-pitting edema present: 1+, Varicosities present: B, Lipodermatosclerosis present: mild B  Neurologic Cranial nerves 2-12 intact , Pain and light touch intact in extremities , Motor exam as listed above  Psychiatric Judgement intact, Mood & affect appropriate for pt's clinical situation, somewhat easily exciteable, +theatric sign  Dermatologic See M/S exam for extremity exam, No rashes otherwise noted  Lymphatic  Palpable lymph nodes:  None    Outside Studies/Documentation   6 pages of outside documents were reviewed including: PCP chart.   Medical Decision Making   Rebecca Ann Bi is a 76 y.o. female who presents with: headache, poorly controlled HTN   I doubt this patient has temporal arteritis, as she has a atypical course and sx.  However, she is having attenuation of sx with steroids and I suspect the PCP will be unwilling to discontinue steroids   without definitive tissue dx.  I discussed with the patient the procedural details of B temporal artery biopsy.  The patient wants general anesthesia for the procedure.  The patient is aware risk include but are not limited to: bleeding, infection, nerve damage. stroke, myocardial infarction, and death.  She elects to proceed.  Will schedule her for 25 SEP 18.  Thank you for allowing us to participate in this patient's care.   Reola Buckles, MD, FACS [facility] Office: [phone] Pager: [phone]  11/27/2016, 10:43 AM   

## 2016-11-30 MED ORDER — VANCOMYCIN HCL 10 G IV SOLR
1500.0000 mg | INTRAVENOUS | Status: AC
  Administered 2016-12-01: 1500 mg via INTRAVENOUS
  Filled 2016-11-30: qty 1500

## 2016-11-30 NOTE — Progress Notes (Signed)
Patient was in public and not going to be home for at least another 1 to 1.5 hours. Instructions only provided over the phone.

## 2016-12-01 ENCOUNTER — Encounter (HOSPITAL_COMMUNITY)
Admission: RE | Disposition: A | Discharge status: Home / Self Care | Payer: Self-pay | Source: Ambulatory Visit | Attending: Vascular Surgery

## 2016-12-01 ENCOUNTER — Ambulatory Visit (HOSPITAL_COMMUNITY)
Admission: RE | Admit: 2016-12-01 | Discharge: 2016-12-01 | Disposition: A | Discharge status: Home / Self Care | Payer: Medicare Other | Source: Ambulatory Visit | Attending: Vascular Surgery | Admitting: Vascular Surgery

## 2016-12-01 ENCOUNTER — Ambulatory Visit (HOSPITAL_COMMUNITY): Payer: Medicare Other | Admitting: Certified Registered"

## 2016-12-01 ENCOUNTER — Encounter (HOSPITAL_COMMUNITY): Payer: Self-pay

## 2016-12-01 ENCOUNTER — Telehealth: Payer: Self-pay | Admitting: Vascular Surgery

## 2016-12-01 DIAGNOSIS — I10 Essential (primary) hypertension: Secondary | ICD-10-CM | POA: Diagnosis not present

## 2016-12-01 DIAGNOSIS — M316 Other giant cell arteritis: Secondary | ICD-10-CM | POA: Diagnosis not present

## 2016-12-01 DIAGNOSIS — R51 Headache: Secondary | ICD-10-CM | POA: Diagnosis not present

## 2016-12-01 DIAGNOSIS — F329 Major depressive disorder, single episode, unspecified: Secondary | ICD-10-CM | POA: Diagnosis not present

## 2016-12-01 DIAGNOSIS — Z880 Allergy status to penicillin: Secondary | ICD-10-CM | POA: Insufficient documentation

## 2016-12-01 DIAGNOSIS — M810 Age-related osteoporosis without current pathological fracture: Secondary | ICD-10-CM | POA: Insufficient documentation

## 2016-12-01 DIAGNOSIS — Z87891 Personal history of nicotine dependence: Secondary | ICD-10-CM | POA: Insufficient documentation

## 2016-12-01 DIAGNOSIS — Z79899 Other long term (current) drug therapy: Secondary | ICD-10-CM | POA: Diagnosis not present

## 2016-12-01 HISTORY — PX: ARTERY BIOPSY: SHX891

## 2016-12-01 LAB — POCT I-STAT 4, (NA,K, GLUC, HGB,HCT)
Glucose, Bld: 84 mg/dL (ref 65–99)
HCT: 37 % (ref 36.0–46.0)
Hemoglobin: 12.6 g/dL (ref 12.0–15.0)
POTASSIUM: 4.2 mmol/L (ref 3.5–5.1)
SODIUM: 140 mmol/L (ref 135–145)

## 2016-12-01 SURGERY — BIOPSY TEMPORAL ARTERY
Anesthesia: Monitor Anesthesia Care | Laterality: Bilateral

## 2016-12-01 MED ORDER — OXYCODONE-ACETAMINOPHEN 5-325 MG PO TABS: 1.0000 | ORAL_TABLET | Freq: Four times a day (QID) | ORAL | 0 refills | Status: DC | PRN

## 2016-12-01 MED ORDER — PROPOFOL 10 MG/ML IV BOLUS
INTRAVENOUS | Status: AC
  Filled 2016-12-01: qty 20

## 2016-12-01 MED ORDER — SUCCINYLCHOLINE CHLORIDE 20 MG/ML IJ SOLN
INTRAMUSCULAR | Status: DC | PRN
  Administered 2016-12-01: 100 mg via INTRAVENOUS

## 2016-12-01 MED ORDER — PHENYLEPHRINE 40 MCG/ML (10ML) SYRINGE FOR IV PUSH (FOR BLOOD PRESSURE SUPPORT)
PREFILLED_SYRINGE | INTRAVENOUS | Status: AC
  Filled 2016-12-01: qty 10

## 2016-12-01 MED ORDER — PROPOFOL 10 MG/ML IV BOLUS
INTRAVENOUS | Status: DC | PRN
  Administered 2016-12-01: 120 mg via INTRAVENOUS

## 2016-12-01 MED ORDER — OXYCODONE-ACETAMINOPHEN 5-325 MG PO TABS
1.0000 | ORAL_TABLET | Freq: Once | ORAL | Status: AC
  Administered 2016-12-01: 1 via ORAL

## 2016-12-01 MED ORDER — PHENYLEPHRINE HCL 10 MG/ML IJ SOLN
INTRAVENOUS | Status: DC | PRN
  Administered 2016-12-01: 50 ug/min via INTRAVENOUS

## 2016-12-01 MED ORDER — ONDANSETRON HCL 4 MG/2ML IJ SOLN
INTRAMUSCULAR | Status: DC | PRN
  Administered 2016-12-01: 4 mg via INTRAVENOUS

## 2016-12-01 MED ORDER — DEXAMETHASONE SODIUM PHOSPHATE 10 MG/ML IJ SOLN
INTRAMUSCULAR | Status: AC
  Filled 2016-12-01: qty 1

## 2016-12-01 MED ORDER — EPHEDRINE SULFATE 50 MG/ML IJ SOLN
INTRAMUSCULAR | Status: DC | PRN
  Administered 2016-12-01: 10 mg via INTRAVENOUS

## 2016-12-01 MED ORDER — FENTANYL CITRATE (PF) 100 MCG/2ML IJ SOLN
25.0000 ug | INTRAMUSCULAR | Status: DC | PRN
  Administered 2016-12-01 (×2): 50 ug via INTRAVENOUS

## 2016-12-01 MED ORDER — FENTANYL CITRATE (PF) 100 MCG/2ML IJ SOLN
INTRAMUSCULAR | Status: AC
  Administered 2016-12-01: 50 ug via INTRAVENOUS
  Filled 2016-12-01: qty 2

## 2016-12-01 MED ORDER — FENTANYL CITRATE (PF) 250 MCG/5ML IJ SOLN
INTRAMUSCULAR | Status: AC
  Filled 2016-12-01: qty 5

## 2016-12-01 MED ORDER — SODIUM CHLORIDE 0.9 % IV SOLN
INTRAVENOUS | Status: DC
  Administered 2016-12-01 (×2): via INTRAVENOUS

## 2016-12-01 MED ORDER — FENTANYL CITRATE (PF) 100 MCG/2ML IJ SOLN
INTRAMUSCULAR | Status: DC | PRN
  Administered 2016-12-01: 75 ug via INTRAVENOUS
  Administered 2016-12-01: 50 ug via INTRAVENOUS
  Administered 2016-12-01: 25 ug via INTRAVENOUS
  Administered 2016-12-01: 50 ug via INTRAVENOUS

## 2016-12-01 MED ORDER — OXYCODONE-ACETAMINOPHEN 5-325 MG PO TABS
ORAL_TABLET | ORAL | Status: AC
  Filled 2016-12-01: qty 2

## 2016-12-01 MED ORDER — DEXAMETHASONE SODIUM PHOSPHATE 10 MG/ML IJ SOLN
10.0000 mg | Freq: Once | INTRAMUSCULAR | Status: AC
  Administered 2016-12-01: 10 mg via INTRAVENOUS

## 2016-12-01 MED ORDER — LIDOCAINE HCL (CARDIAC) 20 MG/ML IV SOLN
INTRAVENOUS | Status: DC | PRN
  Administered 2016-12-01: 40 mg via INTRAVENOUS

## 2016-12-01 SURGICAL SUPPLY — 41 items
ADH SKN CLS APL DERMABOND .7 (GAUZE/BANDAGES/DRESSINGS) ×1
ADH SKN CLS LQ APL DERMABOND (GAUZE/BANDAGES/DRESSINGS) ×1
BALL CTTN LRG ABS STRL LF (GAUZE/BANDAGES/DRESSINGS) ×1
CANISTER SUCT 3000ML PPV (MISCELLANEOUS) ×3 IMPLANT
CONT SPEC 4OZ CLIKSEAL STRL BL (MISCELLANEOUS) ×5 IMPLANT
COTTONBALL LRG STERILE PKG (GAUZE/BANDAGES/DRESSINGS) ×3 IMPLANT
COVER SURGICAL LIGHT HANDLE (MISCELLANEOUS) ×2 IMPLANT
DECANTER SPIKE VIAL GLASS SM (MISCELLANEOUS) ×1 IMPLANT
DERMABOND ADHESIVE PROPEN (GAUZE/BANDAGES/DRESSINGS) ×2
DERMABOND ADVANCED (GAUZE/BANDAGES/DRESSINGS) ×2
DERMABOND ADVANCED .7 DNX12 (GAUZE/BANDAGES/DRESSINGS) ×1 IMPLANT
DERMABOND ADVANCED .7 DNX6 (GAUZE/BANDAGES/DRESSINGS) IMPLANT
DRAPE FEM ANGIO 86.5X124 2WIN (DRAPES) ×2 IMPLANT
DRAPE LAPAROTOMY T 102X78X121 (DRAPES) ×1 IMPLANT
ELECT NDL TIP 2.8 STRL (NEEDLE) ×1 IMPLANT
ELECT NEEDLE TIP 2.8 STRL (NEEDLE) ×3 IMPLANT
ELECT REM PT RETURN 9FT ADLT (ELECTROSURGICAL) ×3
ELECTRODE REM PT RTRN 9FT ADLT (ELECTROSURGICAL) ×1 IMPLANT
GAUZE SPONGE 2X2 8PLY STRL LF (GAUZE/BANDAGES/DRESSINGS) ×1 IMPLANT
GAUZE SPONGE 4X4 16PLY XRAY LF (GAUZE/BANDAGES/DRESSINGS) ×2 IMPLANT
GLOVE BIO SURGEON STRL SZ7 (GLOVE) ×5 IMPLANT
GLOVE BIOGEL PI IND STRL 7.5 (GLOVE) ×1 IMPLANT
GLOVE BIOGEL PI INDICATOR 7.5 (GLOVE) ×2
GOWN STRL REUS W/ TWL LRG LVL3 (GOWN DISPOSABLE) ×3 IMPLANT
GOWN STRL REUS W/TWL LRG LVL3 (GOWN DISPOSABLE) ×6
KIT BASIN OR (CUSTOM PROCEDURE TRAY) ×3 IMPLANT
KIT ROOM TURNOVER OR (KITS) ×3 IMPLANT
NDL HYPO 25GX1X1/2 BEV (NEEDLE) ×1 IMPLANT
NEEDLE HYPO 25GX1X1/2 BEV (NEEDLE) IMPLANT
NS IRRIG 1000ML POUR BTL (IV SOLUTION) ×3 IMPLANT
PACK GENERAL/GYN (CUSTOM PROCEDURE TRAY) ×3 IMPLANT
PAD ARMBOARD 7.5X6 YLW CONV (MISCELLANEOUS) ×4 IMPLANT
SPONGE GAUZE 2X2 STER 10/PKG (GAUZE/BANDAGES/DRESSINGS)
SUT PROLENE 6 0 BV (SUTURE) IMPLANT
SUT SILK 3 0 (SUTURE)
SUT SILK 3-0 18XBRD TIE 12 (SUTURE) ×1 IMPLANT
SUT VIC AB 3-0 SH 27 (SUTURE)
SUT VIC AB 3-0 SH 27X BRD (SUTURE) ×1 IMPLANT
SUT VICRYL 4-0 PS2 18IN ABS (SUTURE) ×3 IMPLANT
SYR CONTROL 10ML LL (SYRINGE) ×1 IMPLANT
WATER STERILE IRR 1000ML POUR (IV SOLUTION) ×1 IMPLANT

## 2016-12-01 NOTE — Telephone Encounter (Signed)
Sched appt 12/25/16 at 3:00. Lm on hm#.

## 2016-12-01 NOTE — Transfer of Care (Signed)
Immediate Anesthesia Transfer of Care Note  Patient: Rebecca Gilmore  Procedure(s) Performed: Procedure(s): BIOPSY TEMPORAL ARTERY (Bilateral)  Patient Location: PACU  Anesthesia Type:General  Level of Consciousness: awake, alert  and oriented  Airway & Oxygen Therapy: Patient connected to face mask oxygen  Post-op Assessment: Post -op Vital signs reviewed and stable  Post vital signs: stable  Last Vitals:  Vitals:   12/01/16 0747  BP: (!) 196/55  Pulse: 60  Resp: 20  SpO2: 100%    Last Pain:  Vitals:   12/01/16 0756  TempSrc:   PainSc: 0-No pain      Patients Stated Pain Goal: 3 (46/43/14 2767)  Complications: No apparent anesthesia complications

## 2016-12-01 NOTE — Anesthesia Preprocedure Evaluation (Addendum)
Anesthesia Evaluation  Patient identified by MRN, date of birth, ID band Patient awake    Reviewed: Allergy & Precautions, NPO status , Patient's Chart, lab work & pertinent test results  Airway Mallampati: II   Neck ROM: Full    Dental  (+) Edentulous Upper   Pulmonary former smoker,    breath sounds clear to auscultation       Cardiovascular hypertension,  Rhythm:Regular Rate:Normal     Neuro/Psych    GI/Hepatic negative GI ROS, Neg liver ROS,   Endo/Other  negative endocrine ROS  Renal/GU negative Renal ROS     Musculoskeletal negative musculoskeletal ROS (+)   Abdominal (+) + obese,   Peds  Hematology negative hematology ROS (+)   Anesthesia Other Findings   Reproductive/Obstetrics                            Anesthesia Physical Anesthesia Plan  ASA: III  Anesthesia Plan: MAC   Post-op Pain Management:    Induction: Intravenous  PONV Risk Score and Plan: 3 and Ondansetron, Dexamethasone, Midazolam and Propofol infusion  Airway Management Planned: Natural Airway and Simple Face Mask  Additional Equipment:   Intra-op Plan:   Post-operative Plan:   Informed Consent: I have reviewed the patients History and Physical, chart, labs and discussed the procedure including the risks, benefits and alternatives for the proposed anesthesia with the patient or authorized representative who has indicated his/her understanding and acceptance.   Dental advisory given  Plan Discussed with:   Anesthesia Plan Comments:        Anesthesia Quick Evaluation

## 2016-12-01 NOTE — Telephone Encounter (Signed)
-----  Message from Mena Goes, RN sent at 12/01/2016 11:31 AM EDT ----- Regarding: 2-4 weeks    ----- Message ----- From: Conrad [place], MD Sent: 12/01/2016  11:08 AM To: [facility] [address]  Rebecca Gilmore [id number] 1940-09-16  PROCEDURE: Bilateral temporal artery biopsy  Follow-up: 2-4 weeks

## 2016-12-01 NOTE — Interval H&P Note (Signed)
History and Physical Interval Note:  12/01/2016 9:02 AM  [name]  has presented today for surgery, with the diagnosis of giant cell arteritis  The various methods of treatment have been discussed with the patient and family. After consideration of risks, benefits and other options for treatment, the patient has consented to  Procedure(s): BIOPSY TEMPORAL ARTERY (Bilateral) as a surgical intervention .  The patient's history has been reviewed, patient examined, no change in status, stable for surgery.  I have reviewed the patient's chart and labs.  Questions were answered to the patient's satisfaction.     Rebecca Gilmore

## 2016-12-01 NOTE — H&P (View-Only) (Signed)
Requested by:  Leonides Sake, [place], [place] [address]  Reason for consultation: temporal arteritis    History of Present Illness   Rebecca Gilmore is a 76 y.o. (12-17-1940) female who presents with cc: headache.  Reportedly woke with "whole head" headache with severe temporal pain on 11/17/16.  She was seen in the ED and management as a migraine.  The patient denies any visual sx during this episode.  She denies any constitutional sx during this episode.  Her BP reportedly was in the 200s.  Her sx did not resolve and went back to the ED for further mgmt.  This time she was treated with steroids.  Reported her PCP has increased her steroid regimen already.  Reportedly this has reduced her sx 90%.   Past Medical History:  Diagnosis Date  . Depression   . HBP (high blood pressure)   . History of bladder problems   . Memory loss   . Muscle pain   . Osteoporosis   . Reflux   . Sinus congestion   . Swelling    B/L FEET AND LEGS    Past Surgical History:  Procedure Laterality Date  . CATARACT SURGERY    . EYELID SURGERY Bilateral   . GROWTH ON FACE    . KNEE SURGERY Right      Social History   Social History  . Marital status: Married    Spouse name: N/A  . Number of children: N/A  . Years of education: N/A   Occupational History  . Not on file.   Social History Main Topics  . Smoking status: Former Smoker    Types: Cigarettes    Quit date: 01/12/1993  . Smokeless tobacco: Former Systems developer    Types: Snuff, Chew     Comment: DATE QUIT UNKNOWN  . Alcohol use No  . Drug use: No  . Sexual activity: Not on file   Other Topics Concern  . Not on file   Social History Narrative  . No narrative on file    Family History  Problem Relation Age of Onset  . Heart disease Mother   . AAA (abdominal aortic aneurysm) Mother   . Heart disease Father     Current Outpatient Prescriptions  Medication Sig Dispense Refill  . Cholecalciferol (VITAMIN D  PO) Take by mouth daily.    . clonazePAM (KLONOPIN) 1 MG tablet Take 1 mg by mouth. 1/2 tab at bedtime    . fluocinonide gel (LIDEX) 0.05 % APPLY GEL TO ULCERS OR AFFECTED AREA 3 TIMES DAILY DURING FLARES  2  . FUROSEMIDE PO Take by mouth.    . losartan (COZAAR) 50 MG tablet Take 50 mg by mouth daily.    . meloxicam (MOBIC) 7.5 MG tablet     . vitamin B-12 (CYANOCOBALAMIN) 1000 MCG tablet Take 1,000 mcg by mouth as needed.    . predniSONE (DELTASONE) 20 MG tablet Take 60 mg by mouth daily. Take 3 Tablets Daily  0   Current Facility-Administered Medications  Medication Dose Route Frequency Provider Last Rate Last Dose  . dexamethasone Sod Phosphate PF SOLN 2 mg  2 mg Injection Once Hyatt, Max T, DPM      . triamcinolone acetonide (KENALOG-40) injection 20 mg  20 mg Intramuscular Once Keswick, Max T, DPM        Allergies  Allergen Reactions  . Kenalog [Triamcinolone Acetonide] Other (See Comments)    FLUSH IN FACE  .  Penicillins     REVIEW OF SYSTEMS (negative unless checked):   Cardiac:  _0  Chest pain or chest pressure? _1  Shortness of breath upon activity? _2  Shortness of breath when lying flat? _3  Irregular heart rhythm?  Vascular:  _4  Pain in calf, thigh, or hip brought on by walking? _5  Pain in feet at night that wakes you up from your sleep? _6  Blood clot in your veins? _7  Leg swelling?  Pulmonary:  _8  Oxygen at home? _9  Productive cough? _10  Wheezing?  Neurologic:  _11  Sudden weakness in arms or legs? _12  Sudden numbness in arms or legs? _13  Sudden onset of difficult speaking or slurred speech? _14  Temporary loss of vision in one eye? _15  Problems with dizziness?  Gastrointestinal:  _16  Blood in stool? _17  Vomited blood?  Genitourinary:  _18  Burning when urinating? _19  Blood in urine?  Psychiatric:  _20  Major depression  Hematologic:  _21  Bleeding problems? _22  Problems with blood clotting?  Dermatologic:  _23  Rashes or ulcers?  Constitutional:  _24   Fever or chills?  Ear/Nose/Throat:  _25  Change in hearing? _26  Nose bleeds? _27  Sore throat?  Musculoskeletal:  _28  Back pain? _29  Joint pain? _30  Muscle pain?   For VQI Use Only   PRE-ADM LIVING Home  AMB STATUS Ambulatory  CAD Sx None  PRIOR CHF None  STRESS TEST No    Physical Examination     Vitals:   11/27/16 0913  BP: (!) 180/100  Pulse: 62  Resp: 18  Temp: (!) 97.1 F (36.2 C)  SpO2: 96%  Weight: 242 lb (109.8 kg)  Height: _31  (1.626 m)   Body mass index is 41.54 kg/m.  General Alert, O x 3, Obese, NAD  Head Malo/AT,  no prominent pulse, no tenderness to palpation of either temporal region, somewhat asx facies  Ear/Nose/ Throat Hearing grossly intact, nares without erythema or drainage, oropharynx with  Erythema without Exudate, Mallampati score: 3,   Eyes PERRLA, EOMI,    Neck Supple, mid-line trachea,    Pulmonary Sym exp, good B air movt, CTA B  Cardiac RRR, Nl S1, S2, no Murmurs, No rubs, No S3,S4  Vascular Vessel Right Left  Radial Palpable Palpable  Brachial Palpable Palpable  Carotid Palpable, No Bruit Palpable, No Bruit  Aorta Not palpable N/A  Femoral Palpable Palpable  Popliteal Not palpable Not palpable  PT Not palpable Not palpable  DP Faintly palpable Faintly palpable    Gastro- intestinal soft, non-distended, non-tender to palpation, No guarding or rebound, no HSM, no masses, no CVAT B, No palpable prominent aortic pulse,    Musculo- skeletal M/S 5/5 throughout  , Extremities without ischemic changes  , Non-pitting edema present: 1+, Varicosities present: B, Lipodermatosclerosis present: mild B  Neurologic Cranial nerves 2-12 intact , Pain and light touch intact in extremities , Motor exam as listed above  Psychiatric Judgement intact, Mood & affect appropriate for pt's clinical situation, somewhat easily exciteable, +theatric sign  Dermatologic See M/S exam for extremity exam, No rashes otherwise noted  Lymphatic  Palpable lymph nodes:  None    Outside Studies/Documentation   6 pages of outside documents were reviewed including: PCP chart.   Medical Decision Making   Laylee Schooley is a 76 y.o. female who presents with: headache, poorly controlled HTN   I doubt this patient has temporal arteritis, as she has a atypical course and sx.  However, she is having attenuation of sx with steroids and I suspect the PCP will be unwilling to discontinue steroids  without definitive tissue dx.  I discussed with the patient the procedural details of B temporal artery biopsy.  The patient wants general anesthesia for the procedure.  The patient is aware risk include but are not limited to: bleeding, infection, nerve damage. stroke, myocardial infarction, and death.  She elects to proceed.  Will schedule her for 25 SEP 18.  Thank you for allowing [place] to participate in this patient's care.   Adele Barthel, MD, FACS [facility] Office: [phone] Pager: [phone]  11/27/2016, 10:43 AM

## 2016-12-01 NOTE — Op Note (Signed)
    OPERATIVE NOTE   PROCEDURE: 1. Bilateral temporal artery biopsy  PRE-OPERATIVE DIAGNOSIS: temporal arteritis  POST-OPERATIVE DIAGNOSIS: same as above   SURGEON: Adele Barthel, MD  ANESTHESIA: general  ESTIMATED BLOOD LOSS: 50 cc  FINDING(S): 1.  Normal appearing small temporal arteries   SPECIMEN(S):  Bilateral temporal arteries  INDICATIONS:   Rebecca Gilmore is a 76 y.o. female who presents with possible temporal arteritis.  The patient presented with atypical headache for temporal arteritis but response to steroid.  Her primary care physician requested temporal artery biopsy to guide her steroid therapy.  The patient is aware risk include but are not limited to: bleeding, infection, nerve damage. stroke, myocardial infarction, and death.   DESCRIPTION: After obtaining full informed written consent, the patient was brought back to the operating room and placed supine upon the operating table.  The patient received IV antibiotics prior to induction.  A procedure time out was completed and the correct surgical site was verified.  After obtaining adequate anesthesia, the patient was prepped and draped in the standard fashion for: bilateral temporal artery biopsy.  I turned my attention to the left temple.  I identified the location of the left temporal artery and marked them on the skin.  I made an incision over the artery and dissected out the artery with electrocautery.  I tied off a >2 cm segment of temporal artery with two 4-0 silk ties.  I transected the tied off segment and passed it off the field as a specimen.  I then washed out the surgical field.  All bleeding points were controlled with electrocautery.  I repaired the subcutaneous tissue with a running stitch of 3-0 Vicryl.  The skin was then reapproximated with a 4-0 Monocryl subcuticular stitch.  The skin was cleaned, dried, and reinforced with Dermabond.  I then repeated these same steps on the right side, harvesting a >2 cm  of temporal artery.   COMPLICATIONS: none  CONDITION: stable   Adele Barthel, MD, [facility] [place] Office: [phone] Pager: [phone]  12/01/2016, 11:02 AM

## 2016-12-01 NOTE — Anesthesia Procedure Notes (Addendum)
Procedure Name: Intubation Date/Time: 12/01/2016 9:59 AM Performed by: Lavell Luster Pre-anesthesia Checklist: Patient identified, Emergency Drugs available, Suction available, Patient being monitored and Timeout performed Patient Re-evaluated:Patient Re-evaluated prior to induction Oxygen Delivery Method: Circle system utilized Preoxygenation: Pre-oxygenation with 100% oxygen Induction Type: IV induction Ventilation: Mask ventilation without difficulty Laryngoscope Size: Mac and 3 Grade View: Grade I Tube type: Oral Tube size: 7.0 mm Number of attempts: 1 Airway Equipment and Method: Stylet Placement Confirmation: ETT inserted through vocal cords under direct vision,  positive ETCO2 and breath sounds checked- equal and bilateral Secured at: 21 cm Tube secured with: Tape Dental Injury: Teeth and Oropharynx as per pre-operative assessment

## 2016-12-02 ENCOUNTER — Encounter (HOSPITAL_COMMUNITY): Payer: Self-pay | Admitting: Vascular Surgery

## 2016-12-02 NOTE — Anesthesia Postprocedure Evaluation (Signed)
Anesthesia Post Note  Patient: Rebecca Gilmore  Procedure(s) Performed: Procedure(s) (LRB): BIOPSY TEMPORAL ARTERY (Bilateral)     Patient location during evaluation: PACU Anesthesia Type: General Level of consciousness: awake and alert Pain management: pain level controlled Vital Signs Assessment: post-procedure vital signs reviewed and stable Respiratory status: spontaneous breathing, nonlabored ventilation, respiratory function stable and patient connected to nasal cannula oxygen Cardiovascular status: blood pressure returned to baseline and stable Postop Assessment: no apparent nausea or vomiting Anesthetic complications: no    Last Vitals:  Vitals:   12/01/16 1209 12/01/16 1210  BP: 126/67   Pulse: (!) 59   Resp: 15   Temp:  36.7 C  SpO2: 98%     Last Pain:  Vitals:   12/01/16 1210  TempSrc:   PainSc: 3                  Tonea Leiphart,JAMES TERRILL

## 2016-12-03 ENCOUNTER — Telehealth: Payer: Self-pay | Admitting: Vascular Surgery

## 2016-12-03 NOTE — Telephone Encounter (Signed)
   Telephone Note   Results of B temporal artery biopsies were discussed with the patient.  No evidence of arteritis was found in either temporal artery.  Adele Barthel, MD, FACS [facility] [place] Office: [phone] Pager: [phone]  12/03/2016, 12:11 PM

## 2016-12-22 NOTE — Progress Notes (Signed)
    Postoperative Visit   History of Present Illness   Rebecca Gilmore is a 76 y.o. year old female who presents for postoperative follow-up for: B temporal artery biopsies.  Her biopsies were: NEGATIVE.  The patient's wounds are healing.  Pt continues to have headaches  Physical Examination   Vitals:   12/25/16 1514  BP: (!) 149/71  Pulse: (!) 59  Resp: 20  Temp: (!) 97.4 F (36.3 C)  TempSrc: Oral  SpO2: 97%  Weight: 239 lb (108.4 kg)  Height: _0  (1.626 m)    R temporal incision: healing, inc c/d/i L temporal incision: healing, inc c/d/i  Medical Decision Making   [name] is a 76 y.o. year old female who presents s/p bilateral temporal artery biopsies   Thank you for allowing [place] to participate in this patient's care.   Adele Barthel, MD, FACS [facility] [place] Office: [phone] Pager: [phone]

## 2016-12-25 ENCOUNTER — Encounter: Payer: Self-pay | Admitting: Vascular Surgery

## 2016-12-25 ENCOUNTER — Ambulatory Visit (INDEPENDENT_AMBULATORY_CARE_PROVIDER_SITE_OTHER): Payer: Self-pay | Admitting: Vascular Surgery

## 2016-12-25 VITALS — BP 149/71 | HR 59 | Temp 97.4°F | Resp 20 | Ht 64.0 in | Wt 239.0 lb

## 2016-12-25 DIAGNOSIS — R51 Headache: Secondary | ICD-10-CM

## 2016-12-25 DIAGNOSIS — G8929 Other chronic pain: Secondary | ICD-10-CM

## 2017-01-06 ENCOUNTER — Ambulatory Visit: Payer: Medicare Other | Admitting: Podiatry

## 2017-01-13 ENCOUNTER — Other Ambulatory Visit: Payer: Self-pay | Admitting: Family Medicine

## 2017-01-13 DIAGNOSIS — R0609 Other forms of dyspnea: Principal | ICD-10-CM

## 2017-01-19 ENCOUNTER — Ambulatory Visit (HOSPITAL_COMMUNITY): Payer: Medicare Other | Attending: Cardiology

## 2017-01-19 ENCOUNTER — Other Ambulatory Visit: Payer: Self-pay

## 2017-01-19 DIAGNOSIS — R0609 Other forms of dyspnea: Secondary | ICD-10-CM | POA: Diagnosis present

## 2017-02-24 ENCOUNTER — Encounter: Payer: Self-pay | Admitting: Podiatry

## 2017-02-24 ENCOUNTER — Ambulatory Visit: Payer: Medicare Other | Admitting: Podiatry

## 2017-02-24 DIAGNOSIS — M7750 Other enthesopathy of unspecified foot: Secondary | ICD-10-CM | POA: Diagnosis not present

## 2017-02-24 NOTE — Progress Notes (Signed)
She presents today with her husband with a new diagnosis of temporal arteritis over the past several months.  She states that she is feeling pretty good though.  She states that her right foot is starting to hurt her again and she points to the sinus tarsi of the right foot.  Objective: Pulses are palpable.  No calf pain.  She has pain on palpation of the sinus tarsi and on an range of motion of the subtalar joints bilaterally right greater than left.  Assessment: Osteoarthritis subtalar joint right.  Capsulitis subtalar joint bilateral.  Plan: Injected the joint today after sterile Betadine skin prep both were injected with 20 mg Kenalog 5 mg of Marcaine to the point of maximal tenderness after verbal consent.  She tolerated procedure well will follow-up with me in the next few months.  She will call with questions or concerns.

## 2017-09-21 DIAGNOSIS — I350 Nonrheumatic aortic (valve) stenosis: Secondary | ICD-10-CM | POA: Insufficient documentation

## 2017-09-21 DIAGNOSIS — R0602 Shortness of breath: Secondary | ICD-10-CM | POA: Insufficient documentation

## 2017-09-22 ENCOUNTER — Encounter: Payer: Self-pay | Admitting: Podiatry

## 2017-09-22 ENCOUNTER — Ambulatory Visit: Payer: Medicare Other | Admitting: Podiatry

## 2017-09-22 DIAGNOSIS — M7752 Other enthesopathy of left foot: Secondary | ICD-10-CM

## 2017-09-22 DIAGNOSIS — M7750 Other enthesopathy of unspecified foot: Secondary | ICD-10-CM

## 2017-09-22 DIAGNOSIS — M7751 Other enthesopathy of right foot: Secondary | ICD-10-CM | POA: Diagnosis not present

## 2017-09-22 NOTE — Progress Notes (Signed)
She presents today chief complaint of pain right in here she points to the sinus tarsi of the right foot.  She states that the left one is doing much better and at this point she is trying to come off of all of her prednisone and does not want to take any more medications.  Objective: Vital signs are stable she is alert and oriented x3.  There is no erythema and moderate edema no cellulitis drainage or odor has pain on palpation of the sinus tarsi and subtalar joint range of motion of the right foot.  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  Plan: Discussed etiology pathology conservative versus surgical therapies at this point after sterile Betadine skin prep I injected 20 mg Kenalog 5 mg Marcaine to the sinus tarsi of the right foot into the subtalar joint.  Tolerated procedure well without complications follow-up with her in 6 months if necessary.

## 2017-10-25 ENCOUNTER — Encounter

## 2017-10-25 ENCOUNTER — Ambulatory Visit: Payer: Medicare Other | Admitting: Cardiovascular Disease

## 2017-11-17 ENCOUNTER — Ambulatory Visit (INDEPENDENT_AMBULATORY_CARE_PROVIDER_SITE_OTHER): Payer: Medicare Other | Admitting: Podiatry

## 2017-11-17 ENCOUNTER — Encounter: Payer: Self-pay | Admitting: Podiatry

## 2017-11-17 DIAGNOSIS — M7752 Other enthesopathy of left foot: Secondary | ICD-10-CM | POA: Diagnosis not present

## 2017-11-17 DIAGNOSIS — M7751 Other enthesopathy of right foot: Secondary | ICD-10-CM

## 2017-11-17 DIAGNOSIS — M7750 Other enthesopathy of unspecified foot: Secondary | ICD-10-CM

## 2017-11-17 NOTE — Progress Notes (Signed)
She presents today chief complaint of painful area right here she points to the sinus tarsi of the right foot pulp.  Objective: Vital signs are stable she is alert and oriented x3 she knows she has osteoarthritis of the sinus tarsi and subtalar joint.  Assessment: Osteoarthritis subtalar joint right.  Plan: Sterile Betadine skin prep injected 20 mg Kenalog 5 mg Marcaine follow-up with her as needed.

## 2017-12-15 ENCOUNTER — Ambulatory Visit: Payer: Medicare Other | Admitting: Podiatry

## 2018-01-18 DIAGNOSIS — R609 Edema, unspecified: Secondary | ICD-10-CM | POA: Insufficient documentation

## 2018-01-18 DIAGNOSIS — R600 Localized edema: Secondary | ICD-10-CM | POA: Insufficient documentation

## 2018-01-26 ENCOUNTER — Ambulatory Visit: Payer: Medicare Other | Admitting: Podiatry

## 2018-02-08 ENCOUNTER — Encounter: Payer: Self-pay | Admitting: Podiatry

## 2018-02-08 ENCOUNTER — Ambulatory Visit: Payer: Medicare Other | Admitting: Podiatry

## 2018-02-08 DIAGNOSIS — M7751 Other enthesopathy of right foot: Secondary | ICD-10-CM

## 2018-02-08 DIAGNOSIS — M7752 Other enthesopathy of left foot: Secondary | ICD-10-CM | POA: Diagnosis not present

## 2018-02-08 NOTE — Progress Notes (Signed)
She presents today chief complaint of painful subtalar joints bilaterally.  She states the last couple of shots really have not helped that much in this right one is just really killing me.  Objective: Vital signs are stable alert and oriented x3.  Pulses are palpable.  Neurologic sensorium is intact.  Degenerative flexors are intact severe pain on inversion range of motion of the subtalar joint particularly of the right foot.  She has pain on direct palpation of the sinus tarsi.  Assessment: Sinus tarsitis subtalar joint osteoarthritis right greater than left.  Plan: Still being followed by rheumatology.  I injected 20 mg Kenalog 5 mg Marcaine and sterile Betadine skin prep with 4 mg of dexamethasone into the subtalar joints bilaterally.  Tolerated procedure well I will follow-up with her in 3 months at the [place] office.

## 2018-05-11 ENCOUNTER — Ambulatory Visit: Payer: Medicare Other | Admitting: Podiatry

## 2018-05-11 ENCOUNTER — Encounter: Payer: Self-pay | Admitting: Podiatry

## 2018-05-11 DIAGNOSIS — M7751 Other enthesopathy of right foot: Secondary | ICD-10-CM

## 2018-05-11 DIAGNOSIS — M7752 Other enthesopathy of left foot: Secondary | ICD-10-CM | POA: Diagnosis not present

## 2018-05-11 NOTE — Progress Notes (Signed)
She presents today for follow-up of capsulitis to the bilateral ankles.  States that they have been hurting really badly.    Objective:  POP of STJ right none left.  Assessment: STJ capsulitis foot right.  Plan:  Injected cortisone to right STJ only.  FU with her in three months.  Husband just had heart attack.

## 2018-08-10 ENCOUNTER — Encounter: Payer: Self-pay | Admitting: Podiatry

## 2018-08-10 ENCOUNTER — Ambulatory Visit: Payer: Medicare Other | Admitting: Podiatry

## 2018-08-10 ENCOUNTER — Other Ambulatory Visit: Payer: Self-pay

## 2018-08-10 VITALS — Temp 97.7°F

## 2018-08-10 DIAGNOSIS — M7751 Other enthesopathy of right foot: Secondary | ICD-10-CM

## 2018-08-10 NOTE — Progress Notes (Signed)
She presents today complaining of pain to the right ankle.  She states that was doing fine for a while and now started to bother her once again as she points to the subtalar joint of the right foot.  Objective: Vital signs are stable she alert oriented x3 pulses are palpable right foot no open lesions or wounds she has pain on inversion and end range of motion and eversion at end range of motion of the subtalar joint.  Assessment: Subtalar joint capsulitis probable osteoarthritic changes of the subtalar joint.  Plan: Discussed etiology pathology and surgical therapies at this point went ahead and reinjected the area today with 20 mg Kenalog 5 mg Marcaine point maximal tenderness of the sinus tarsi making sure to get into the subtalar joint itself is well.  I will follow-up with her in 3 months if necessary.

## 2018-11-09 ENCOUNTER — Ambulatory Visit (INDEPENDENT_AMBULATORY_CARE_PROVIDER_SITE_OTHER): Payer: Medicare Other | Admitting: Podiatry

## 2018-11-09 ENCOUNTER — Ambulatory Visit: Payer: Medicare Other | Admitting: Podiatry

## 2018-11-09 ENCOUNTER — Encounter: Payer: Self-pay | Admitting: Podiatry

## 2018-11-09 ENCOUNTER — Other Ambulatory Visit: Payer: Self-pay

## 2018-11-09 DIAGNOSIS — M778 Other enthesopathies, not elsewhere classified: Secondary | ICD-10-CM

## 2018-11-09 DIAGNOSIS — M7751 Other enthesopathy of right foot: Secondary | ICD-10-CM

## 2018-11-09 DIAGNOSIS — M779 Enthesopathy, unspecified: Secondary | ICD-10-CM | POA: Diagnosis not present

## 2018-11-09 NOTE — Progress Notes (Signed)
She presents today for follow-up of capsulitis of the right ankle states that is been hurting and the shots really have not been helping recently.  And after the injections I get such bad cramps at nighttime.  I actually fell the other night while I was trying to walk off a cramp injuring my right knee my left cheek my left arm.  Objective: Vital signs are stable she is alert and oriented x3 pulses are palpable.  She has pain on palpation of the sinus tarsi and in range of motion of the subtalar joint right foot.  Assessment: Chronic subtalar joint capsulitis osteoarthritis right.  Plan: At this point I am requesting an MRI of the right subtalar joint for surgical consideration.  This patient may not qualify for any type of surgical intervention due to health issues.  However if there is something the reasonably easy that we could do to alleviate her symptoms an MRI would be necessary for this.  I did place her in a Tri-Lock brace.

## 2018-11-18 ENCOUNTER — Other Ambulatory Visit: Payer: Self-pay

## 2018-11-18 DIAGNOSIS — M19079 Primary osteoarthritis, unspecified ankle and foot: Secondary | ICD-10-CM

## 2018-11-18 NOTE — Progress Notes (Unsigned)
No prior auth required per [facility] website

## 2018-12-01 ENCOUNTER — Ambulatory Visit
Admission: RE | Admit: 2018-12-01 | Discharge: 2018-12-01 | Disposition: A | Discharge status: Home / Self Care | Payer: Medicare Other | Source: Ambulatory Visit | Attending: Podiatry | Admitting: Podiatry

## 2018-12-01 ENCOUNTER — Other Ambulatory Visit: Payer: Self-pay

## 2018-12-01 DIAGNOSIS — M19079 Primary osteoarthritis, unspecified ankle and foot: Secondary | ICD-10-CM | POA: Insufficient documentation

## 2018-12-02 ENCOUNTER — Telehealth: Payer: Self-pay

## 2018-12-02 NOTE — Telephone Encounter (Signed)
Patient called asking if we had received her MRI that she had done yesterday, she also needs to schedule an appointment discussed MRI results with Dr. Milinda Pointer.

## 2018-12-05 ENCOUNTER — Telehealth: Payer: Self-pay | Admitting: *Deleted

## 2018-12-05 NOTE — Telephone Encounter (Signed)
-----  Message from Garrel Ridgel, [place] sent at 12/05/2018  8:17 AM EDT ----- I would like for Dr. Amalia Hailey to see her for surgical consideration.  He can also go over the results with her.  I'll see her back if she doesn't want surgery.

## 2018-12-05 NOTE — Telephone Encounter (Signed)
I informed pt of Dr. Stephenie Acres request for pt to see Dr. Amalia Hailey, and transferred to the schedulers.

## 2018-12-20 ENCOUNTER — Telehealth: Payer: Self-pay | Admitting: Podiatry

## 2018-12-20 ENCOUNTER — Other Ambulatory Visit: Payer: Self-pay

## 2018-12-20 ENCOUNTER — Ambulatory Visit: Payer: Medicare Other | Admitting: Podiatry

## 2018-12-20 ENCOUNTER — Encounter: Payer: Self-pay | Admitting: Podiatry

## 2018-12-20 DIAGNOSIS — M722 Plantar fascial fibromatosis: Secondary | ICD-10-CM | POA: Diagnosis not present

## 2018-12-20 DIAGNOSIS — M778 Other enthesopathies, not elsewhere classified: Secondary | ICD-10-CM | POA: Diagnosis not present

## 2018-12-20 NOTE — Telephone Encounter (Signed)
Pt called requesting a call back from the nurse regarding some question she has about medications.  Please give pt a call.

## 2018-12-27 ENCOUNTER — Other Ambulatory Visit: Payer: Self-pay | Admitting: Podiatry

## 2018-12-27 MED ORDER — PREGABALIN 75 MG PO CAPS: 75.0000 mg | ORAL_CAPSULE | Freq: Two times a day (BID) | ORAL | 1 refills | Status: DC

## 2018-12-27 NOTE — Progress Notes (Signed)
PRN pain

## 2018-12-28 NOTE — Progress Notes (Signed)
   HPI: 78 y.o. female presenting today for follow-up evaluation and treatment regarding right foot pain.  Patient had an MRI performed on 12/01/2018 ordered by Dr. Milinda Pointer.  She presents today to review the MRI results and discuss possible surgical intervention.  She has been having ongoing pain for several months now.  There is been no alleviation of symptoms despite multiple conservative modalities.  Past Medical History:  Diagnosis Date  . Depression   . HBP (high blood pressure)   . History of bladder problems   . Memory loss   . Muscle pain   . Osteoporosis   . Reflux   . Sinus congestion   . Swelling    B/L FEET AND LEGS     Physical Exam: General: The patient is alert and oriented x3 in no acute distress.  Dermatology: Skin is warm, dry and supple bilateral lower extremities. Negative for open lesions or macerations.  Vascular: Palpable pedal pulses bilaterally. No edema or erythema noted. Capillary refill within normal limits.  Neurological: Epicritic and protective threshold grossly intact bilaterally.   Musculoskeletal Exam: Range of motion within normal limits to all pedal and ankle joints bilateral. Muscle strength 5/5 in all groups bilateral.  There is diffuse pain on palpation throughout the midtarsal joints right foot.  MRI impression 12/01/2018: 1. Advanced talonavicular joint osteoarthritis with superior dorsal fragmented osteophyte 2. Hindfoot valgus deformity 3. Longitudinal split tear of the peroneal brevis tendon at the level of the hindfoot within normal distal insertion 4. Posterior tibialis, flexor digitorum, flexor hallucis longus tenosynovitis 5. Heel pad inflammation 6. Mild muscular edema involving the extensor musculature. 7. Intrasubstance degeneration of the deltoid  Assessment: 1.  Generalized foot pain right 2.  Plantar fasciitis right-mid substance   Plan of Care:  1. Patient evaluated.  MRI reviewed.  2.  A lot of the MRI findings are  noncontributory to the patient's symptoms.  At the moment the patient's plantar fasciitis appears to be more symptomatic than anything else.  We are going to continue conservative treatment at the moment and try to avoid surgery due to the patient's comorbidities and health issues. 3.  Recommend conservative treatments at the moment.  Do not recommend surgery due to patient's health and comorbidities 4.  Injection of 0.5 cc Celestone Soluspan injected along the mid substance of the plantar fascia right 5.  Return to clinic as needed      Edrick Kins, DPM [facility]  Dr. Edrick Kins, DPM    [address], [place] [address]                Office [phone]  Fax [phone]

## 2019-01-03 ENCOUNTER — Other Ambulatory Visit: Payer: Self-pay

## 2019-01-03 NOTE — Progress Notes (Signed)
Patient pre screened for office appointment, no questions or concerns today. 

## 2019-01-04 ENCOUNTER — Inpatient Hospital Stay: Payer: Medicare Other | Attending: Oncology | Admitting: Oncology

## 2019-01-04 ENCOUNTER — Encounter: Payer: Self-pay | Admitting: Oncology

## 2019-01-04 ENCOUNTER — Other Ambulatory Visit: Payer: Self-pay

## 2019-01-04 VITALS — BP 130/70 | HR 56 | Temp 97.4°F | Resp 20 | Wt 241.9 lb

## 2019-01-04 DIAGNOSIS — Z79899 Other long term (current) drug therapy: Secondary | ICD-10-CM | POA: Insufficient documentation

## 2019-01-04 DIAGNOSIS — F329 Major depressive disorder, single episode, unspecified: Secondary | ICD-10-CM | POA: Diagnosis not present

## 2019-01-04 DIAGNOSIS — K219 Gastro-esophageal reflux disease without esophagitis: Secondary | ICD-10-CM | POA: Insufficient documentation

## 2019-01-04 DIAGNOSIS — Z8249 Family history of ischemic heart disease and other diseases of the circulatory system: Secondary | ICD-10-CM | POA: Diagnosis not present

## 2019-01-04 DIAGNOSIS — R778 Other specified abnormalities of plasma proteins: Secondary | ICD-10-CM | POA: Diagnosis present

## 2019-01-04 DIAGNOSIS — N1832 Chronic kidney disease, stage 3b: Secondary | ICD-10-CM | POA: Diagnosis not present

## 2019-01-04 DIAGNOSIS — I129 Hypertensive chronic kidney disease with stage 1 through stage 4 chronic kidney disease, or unspecified chronic kidney disease: Secondary | ICD-10-CM | POA: Diagnosis not present

## 2019-01-04 DIAGNOSIS — F17210 Nicotine dependence, cigarettes, uncomplicated: Secondary | ICD-10-CM | POA: Diagnosis not present

## 2019-01-04 NOTE — Progress Notes (Signed)
Patient does not offer any problems today.

## 2019-01-07 NOTE — Progress Notes (Signed)
Hematology/Oncology Consult note [facility] Telephone:([phone] [phone]   Patient Care Team: Philmore Pali, NP as PCP - General (Nurse Practitioner)  REFERRING PROVIDER: Franciso Bend, MD  CHIEF COMPLAINTS/REASON FOR VISIT:  Evaluation of abnormal SPEP  HISTORY OF PRESENTING ILLNESS:  Rebecca Gilmore is a  78 y.o.  female with PMH listed below who was referred to me for evaluation of anemia Reviewed patient's recent labs that was done.  10/17/2018 labs revealed anemia with hemoglobin of 12.4 Free kappa light chain 5.35, lambda light chain 2.23, kappa lambda ratio 2.31. SPEP showed faint monoclonal component typed as IgG kappa.  Faint band suspicious for monoclonal components No aggravating or improving factors.  Associated signs and symptoms: Chronic fatigue.  No exacerbating or alleviating factors. . Patient has chronic kidney disease stage III follow-up with [facility] nephrology Dr. 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  Has been on chronic steroids.She reports her current prednisone regimen is 5 mg alternating with 2.5 mg. Chronic joint, neck, back pains.  Review of Systems  Constitutional: Positive for fatigue. Negative for appetite change, chills and fever.  HENT:   Negative for hearing loss and voice change.   Eyes: Negative for eye problems.  Respiratory: Negative for chest tightness and cough.   Cardiovascular: Negative for chest pain.  Gastrointestinal: Negative for abdominal distention, abdominal pain and blood in stool.  Endocrine: Negative for hot flashes.  Genitourinary: Negative for difficulty urinating and frequency.   Musculoskeletal: Positive for arthralgias, back pain and neck pain.  Skin: Negative for itching and rash.  Neurological: Negative for extremity weakness.  Hematological: Negative for adenopathy.  Psychiatric/Behavioral: Negative for confusion.    MEDICAL HISTORY:  Past Medical  History:  Diagnosis Date  . Depression   . HBP (high blood pressure)   . History of bladder problems   . Memory loss   . Muscle pain   . Osteoporosis   . Reflux   . Sinus congestion   . Swelling    B/L FEET AND LEGS    SURGICAL HISTORY: Past Surgical History:  Procedure Laterality Date  . ARTERY BIOPSY Bilateral 12/01/2016   Procedure: BIOPSY TEMPORAL ARTERY;  Surgeon: Conrad [place], MD;  Location: [place];  Service: Vascular;  Laterality: Bilateral;  . CATARACT SURGERY    . EYELID SURGERY Bilateral   . GROWTH ON FACE    . KNEE SURGERY Right     SOCIAL HISTORY: Social History   Socioeconomic History  . Marital status: Married    Spouse name: Not on file  . Number of children: Not on file  . Years of education: Not on file  . Highest education level: Not on file  Occupational History  . Not on file  Social Needs  . Financial resource strain: Not on file  . Food insecurity    Worry: Not on file    Inability: Not on file  . Transportation needs    Medical: Not on file    Non-medical: Not on file  Tobacco Use  . Smoking status: Former Smoker    Types: Cigarettes    Quit date: 01/12/1993    Years since quitting: 26.0  . Smokeless tobacco: Former Systems developer    Types: Snuff, Chew  . Tobacco comment: DATE QUIT UNKNOWN  Substance and Sexual Activity  . Alcohol use: No  . Drug use: No  . Sexual activity: Not on file  Lifestyle  . Physical activity    Days per  week: Not on file    Minutes per session: Not on file  . Stress: Not on file  Relationships  . Social Herbalist on phone: Not on file    Gets together: Not on file    Attends religious service: Not on file    Active member of club or organization: Not on file    Attends meetings of clubs or organizations: Not on file    Relationship status: Not on file  . Intimate partner violence    Fear of current or ex partner: Not on file    Emotionally abused: Not on file    Physically abused: Not on file     Forced sexual activity: Not on file  Other Topics Concern  . Not on file  Social History Narrative  . Not on file    FAMILY HISTORY: Family History  Problem Relation Age of Onset  . Heart disease Mother   . AAA (abdominal aortic aneurysm) Mother   . Heart disease Father     ALLERGIES:  is allergic to kenalog [triamcinolone acetonide] and penicillins.  MEDICATIONS:  Current Outpatient Medications  Medication Sig Dispense Refill  . cholecalciferol (VITAMIN D3) 25 MCG (1000 UT) tablet Take 1,000 Units by mouth daily.    . cimetidine (TAGAMET) 200 MG tablet Take 200 mg by mouth 2 (two) times daily as needed (for acid reflux/indigestion.).    Marland Kitchen fluocinonide gel (LIDEX) 0.05 % APPLY GEL TO ULCERS OR AFFECTED AREA 3 TIMES DAILY AS NEEDED DURING FLARES  2  . furosemide (LASIX) 40 MG tablet Take 40-80 mg by mouth daily as needed. For fluid retention (swollen ankles)    . loratadine (CLARITIN) 10 MG tablet Take by mouth.    . losartan (COZAAR) 100 MG tablet Take 100 mg by mouth daily. 1/4 tab for high blood pressure    . predniSONE (DELTASONE) 20 MG tablet Take 5 mg by mouth daily before breakfast. Alternating 5 mg with 2.5 mg  0  . pregabalin (LYRICA) 75 MG capsule Take 1 capsule (75 mg total) by mouth 2 (two) times daily. 90 capsule 1  . sertraline (ZOLOFT) 50 MG tablet Take 25 mg by mouth daily.  0  . vitamin B-12 (CYANOCOBALAMIN) 1000 MCG tablet Take 1,000 mcg by mouth daily.     Marland Kitchen telmisartan (MICARDIS) 40 MG tablet Take 40 mg by mouth daily.     Current Facility-Administered Medications  Medication Dose Route Frequency Provider Last Rate Last Dose  . triamcinolone acetonide (KENALOG-40) injection 20 mg  20 mg Intramuscular Once Parkman, Max T, DPM         PHYSICAL EXAMINATION: ECOG PERFORMANCE STATUS: 1 - Symptomatic but completely ambulatory Vitals:   01/04/19 0951  BP: 130/70  Pulse: (!) 56  Resp: 20  Temp: (!) 97.4 F (36.3 C)   Filed Weights   01/04/19 0951  Weight:  241 lb 14.4 oz (109.7 kg)    Physical Exam Constitutional:      General: She is not in acute distress. HENT:     Head: Normocephalic and atraumatic.  Eyes:     General: No scleral icterus.    Pupils: Pupils are equal, round, and reactive to light.  Neck:     Musculoskeletal: Normal range of motion and neck supple.  Cardiovascular:     Rate and Rhythm: Normal rate and regular rhythm.     Heart sounds: Normal heart sounds.  Pulmonary:     Effort: Pulmonary effort is normal. No  respiratory distress.     Breath sounds: No wheezing.  Abdominal:     General: Bowel sounds are normal. There is no distension.     Palpations: Abdomen is soft. There is no mass.     Tenderness: There is no abdominal tenderness.  Musculoskeletal: Normal range of motion.        General: No deformity.  Skin:    General: Skin is warm and dry.     Findings: No erythema or rash.  Neurological:     Mental Status: She is alert and oriented to person, place, and time.     Cranial Nerves: No cranial nerve deficit.     Coordination: Coordination normal.  Psychiatric:        Behavior: Behavior normal.        Thought Content: Thought content normal.      LABORATORY DATA:  I have reviewed the data as listed Lab Results  Component Value Date   WBC 8.1 08/02/2008   HGB 12.6 12/01/2016   HCT 37.0 12/01/2016   MCV 88.7 08/02/2008   PLT 250 08/02/2008   No results for input(s): NA, K, CL, CO2, GLUCOSE, BUN, CREATININE, CALCIUM, GFRNONAA, GFRAA, PROT, ALBUMIN, AST, ALT, ALKPHOS, BILITOT, BILIDIR, IBILI in the last 8760 hours. Iron/TIBC/Ferritin/ %Sat No results found for: IRON, TIBC, FERRITIN, IRONPCTSAT      ASSESSMENT & PLAN:  1. Abnormal SPEP   2. Stage 3b chronic kidney disease    I discussed with patient about the diagnosis of possible diagnosis of  MGUS which is an asymptomatic condition which has a small risk of progression to malignant plasma cell dyscrasia or lymphoproliferative disorder.    Recommend patient to repeat blood work Check CBC, CMP, multiple myeloma panel, kappa light chain ratio, beta microglobulin level, UPEP and urine, smear.  Immunofixation.  Orders Placed This Encounter  Procedures  . CBC with Differential/Platelet    Standing Status:   Future    Standing Expiration Date:   01/04/2020  . Technologist smear review    Standing Status:   Future    Standing Expiration Date:   01/04/2020  . Multiple Myeloma Panel (SPEP&IFE w/QIG)    Standing Status:   Future    Standing Expiration Date:   01/04/2020  . Kappa/lambda light chains    Standing Status:   Future    Standing Expiration Date:   01/04/2020  . Beta 2 microglobulin, serum    Standing Status:   Future    Standing Expiration Date:   01/04/2020  . UPEP/UIFE/Light Chains/TP, 24-Hr Ur    Standing Status:   Future    Standing Expiration Date:   01/04/2020    All questions were answered. The patient knows to call the clinic with any problems questions or concerns. Cc. Smith Mince, Raven A, MD  Return of visit: 2 weeks Thank you for this kind referral and the opportunity to participate in the care of this patient. A copy of today's note is routed to referring provider  Total face to face encounter time for this patient visit was 12mn. >50% of the time was  spent in counseling and coordination of care.    ZEarlie Server MD, PhD 01/07/2019

## 2019-01-10 ENCOUNTER — Telehealth: Payer: Self-pay | Admitting: *Deleted

## 2019-01-10 NOTE — Telephone Encounter (Signed)
Patient called and requests the orders for labs went have her scheduled for be sent to [facility] to be drawn, THe fax number to send orders to is [phone]

## 2019-01-10 NOTE — Telephone Encounter (Signed)
Is she wanting an order for her 01/20/2019 labs to be drawn at [facility]?

## 2019-01-11 NOTE — Telephone Encounter (Signed)
Discussed with patient that the labs Dr. Tasia Catchings ordered is more complicated labs and would be better if she is able/willing to come here for these labs.  She is willing but only if she can get it scheduled the same day she will will be at [facility].  I called [facility] to confirm what labs they will be drawing to make sure we will not be repeating labs.  Will get her scheduled to come here for labs same day as [facility] labs and change the MD appt to an earlier in the week, per her request.    LC, please reschedule her lab appt to 11/16 around 11:30 and the MD appt to a Monday or Tuesday mid morning appt and let patient know of appt change.

## 2019-01-11 NOTE — Telephone Encounter (Signed)
Yes, she said she gets her labs drawn there all the time and she prefers that they draw our labs for 11/13

## 2019-01-12 NOTE — Telephone Encounter (Signed)
Scheduling pool message sent.

## 2019-01-20 ENCOUNTER — Other Ambulatory Visit: Payer: Medicare Other

## 2019-01-23 ENCOUNTER — Inpatient Hospital Stay: Payer: Medicare Other | Attending: Oncology

## 2019-01-23 ENCOUNTER — Other Ambulatory Visit: Payer: Self-pay

## 2019-01-23 DIAGNOSIS — M549 Dorsalgia, unspecified: Secondary | ICD-10-CM | POA: Diagnosis not present

## 2019-01-23 DIAGNOSIS — R779 Abnormality of plasma protein, unspecified: Secondary | ICD-10-CM | POA: Insufficient documentation

## 2019-01-23 DIAGNOSIS — M2550 Pain in unspecified joint: Secondary | ICD-10-CM | POA: Diagnosis not present

## 2019-01-23 DIAGNOSIS — N1832 Chronic kidney disease, stage 3b: Secondary | ICD-10-CM | POA: Diagnosis not present

## 2019-01-23 DIAGNOSIS — M542 Cervicalgia: Secondary | ICD-10-CM | POA: Insufficient documentation

## 2019-01-23 DIAGNOSIS — R778 Other specified abnormalities of plasma proteins: Secondary | ICD-10-CM

## 2019-01-23 DIAGNOSIS — I129 Hypertensive chronic kidney disease with stage 1 through stage 4 chronic kidney disease, or unspecified chronic kidney disease: Secondary | ICD-10-CM | POA: Diagnosis not present

## 2019-01-23 DIAGNOSIS — Z7952 Long term (current) use of systemic steroids: Secondary | ICD-10-CM | POA: Insufficient documentation

## 2019-01-23 DIAGNOSIS — M316 Other giant cell arteritis: Secondary | ICD-10-CM | POA: Insufficient documentation

## 2019-01-23 DIAGNOSIS — G8929 Other chronic pain: Secondary | ICD-10-CM | POA: Insufficient documentation

## 2019-01-23 DIAGNOSIS — Z87891 Personal history of nicotine dependence: Secondary | ICD-10-CM | POA: Diagnosis not present

## 2019-01-23 LAB — CBC WITH DIFFERENTIAL/PLATELET
Abs Immature Granulocytes: 0.03 10*3/uL (ref 0.00–0.07)
Basophils Absolute: 0 10*3/uL (ref 0.0–0.1)
Basophils Relative: 0 %
Eosinophils Absolute: 0.1 10*3/uL (ref 0.0–0.5)
Eosinophils Relative: 2 %
HCT: 33.6 % — ABNORMAL LOW (ref 36.0–46.0)
Hemoglobin: 10.6 g/dL — ABNORMAL LOW (ref 12.0–15.0)
Immature Granulocytes: 0 %
Lymphocytes Relative: 32 %
Lymphs Abs: 2.3 10*3/uL (ref 0.7–4.0)
MCH: 29.2 pg (ref 26.0–34.0)
MCHC: 31.5 g/dL (ref 30.0–36.0)
MCV: 92.6 fL (ref 80.0–100.0)
Monocytes Absolute: 0.6 10*3/uL (ref 0.1–1.0)
Monocytes Relative: 8 %
Neutro Abs: 4.2 10*3/uL (ref 1.7–7.7)
Neutrophils Relative %: 58 %
Platelets: 230 10*3/uL (ref 150–400)
RBC: 3.63 MIL/uL — ABNORMAL LOW (ref 3.87–5.11)
RDW: 12.7 % (ref 11.5–15.5)
WBC: 7.3 10*3/uL (ref 4.0–10.5)
nRBC: 0 % (ref 0.0–0.2)

## 2019-01-23 LAB — COMPREHENSIVE METABOLIC PANEL
ALT: 11 U/L (ref 0–44)
AST: 20 U/L (ref 15–41)
Albumin: 3.7 g/dL (ref 3.5–5.0)
Alkaline Phosphatase: 52 U/L (ref 38–126)
Anion gap: 7 (ref 5–15)
BUN: 25 mg/dL — ABNORMAL HIGH (ref 8–23)
CO2: 27 mmol/L (ref 22–32)
Calcium: 9 mg/dL (ref 8.9–10.3)
Chloride: 102 mmol/L (ref 98–111)
Creatinine, Ser: 1.53 mg/dL — ABNORMAL HIGH (ref 0.44–1.00)
GFR calc Af Amer: 37 mL/min — ABNORMAL LOW (ref >60)
GFR calc non Af Amer: 32 mL/min — ABNORMAL LOW (ref >60)
Glucose, Bld: 113 mg/dL — ABNORMAL HIGH (ref 70–99)
Potassium: 3.7 mmol/L (ref 3.5–5.1)
Sodium: 136 mmol/L (ref 135–145)
Total Bilirubin: 0.7 mg/dL (ref 0.3–1.2)
Total Protein: 6.8 g/dL (ref 6.5–8.1)

## 2019-01-23 LAB — TECHNOLOGIST SMEAR REVIEW: Tech Review: ADEQUATE

## 2019-01-24 LAB — BETA 2 MICROGLOBULIN, SERUM: Beta-2 Microglobulin: 3.3 mg/L — ABNORMAL HIGH (ref 0.6–2.4)

## 2019-01-24 LAB — KAPPA/LAMBDA LIGHT CHAINS
Kappa free light chain: 52.8 mg/L — ABNORMAL HIGH (ref 3.3–19.4)
Kappa, lambda light chain ratio: 1.64 (ref 0.26–1.65)
Lambda free light chains: 32.2 mg/L — ABNORMAL HIGH (ref 5.7–26.3)

## 2019-01-25 LAB — MULTIPLE MYELOMA PANEL, SERUM
Albumin SerPl Elph-Mcnc: 3.5 g/dL (ref 2.9–4.4)
Albumin/Glob SerPl: 1.2 (ref 0.7–1.7)
Alpha 1: 0.3 g/dL (ref 0.0–0.4)
Alpha2 Glob SerPl Elph-Mcnc: 0.8 g/dL (ref 0.4–1.0)
B-Globulin SerPl Elph-Mcnc: 1 g/dL (ref 0.7–1.3)
Gamma Glob SerPl Elph-Mcnc: 0.8 g/dL (ref 0.4–1.8)
Globulin, Total: 3 g/dL (ref 2.2–3.9)
IgA: 261 mg/dL (ref 64–422)
IgG (Immunoglobin G), Serum: 797 mg/dL (ref 586–1602)
IgM (Immunoglobulin M), Srm: 119 mg/dL (ref 26–217)
Total Protein ELP: 6.5 g/dL (ref 6.0–8.5)

## 2019-01-27 ENCOUNTER — Ambulatory Visit: Payer: Medicare Other | Admitting: Oncology

## 2019-02-05 DIAGNOSIS — R779 Abnormality of plasma protein, unspecified: Secondary | ICD-10-CM | POA: Diagnosis not present

## 2019-02-06 ENCOUNTER — Inpatient Hospital Stay (HOSPITAL_BASED_OUTPATIENT_CLINIC_OR_DEPARTMENT_OTHER): Payer: Medicare Other | Admitting: Oncology

## 2019-02-06 ENCOUNTER — Ambulatory Visit: Payer: Medicare Other | Admitting: Oncology

## 2019-02-06 ENCOUNTER — Other Ambulatory Visit: Payer: Self-pay

## 2019-02-06 ENCOUNTER — Encounter: Payer: Self-pay | Admitting: Oncology

## 2019-02-06 VITALS — BP 137/74 | HR 66 | Temp 98.8°F | Resp 18 | Wt 242.5 lb

## 2019-02-06 DIAGNOSIS — N1832 Chronic kidney disease, stage 3b: Secondary | ICD-10-CM

## 2019-02-06 DIAGNOSIS — R779 Abnormality of plasma protein, unspecified: Secondary | ICD-10-CM | POA: Diagnosis not present

## 2019-02-06 DIAGNOSIS — R778 Other specified abnormalities of plasma proteins: Secondary | ICD-10-CM

## 2019-02-06 NOTE — Progress Notes (Signed)
Hematology/Oncology Consult note [facility] Telephone:([phone] [phone]   Patient Care Team: Philmore Pali, NP as PCP - General (Nurse Practitioner)  REFERRING PROVIDER: Philmore Pali, NP  CHIEF COMPLAINTS/REASON FOR VISIT:  Evaluation of abnormal SPEP  HISTORY OF PRESENTING ILLNESS:  Rebecca Gilmore is a  78 y.o.  female with PMH listed below who was referred to me for evaluation of anemia Reviewed patient's recent labs that was done.  10/17/2018 labs revealed anemia with hemoglobin of 12.4 Free kappa light chain 5.35, lambda light chain 2.23, kappa lambda ratio 2.31. SPEP showed faint monoclonal component typed as IgG kappa.  Faint band suspicious for monoclonal components No aggravating or improving factors.  Associated signs and symptoms: Chronic fatigue.  No exacerbating or alleviating factors. . Patient has chronic kidney disease stage III follow-up with [facility] nephrology Dr. 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  Has been on chronic steroids.She reports her current prednisone regimen is 5 mg alternating with 2.5 mg. Chronic joint, neck, back pains.  INTERVAL HISTORY Rebecca Gilmore is a 79 y.o. female who has above history reviewed by me today presents for follow up visit for management of  Problems and complaints are listed below: Patient present to discuss lab results.  She has no new complaints today.   Review of Systems  Constitutional: Positive for fatigue. Negative for appetite change, chills and fever.  HENT:   Negative for hearing loss and voice change.   Eyes: Negative for eye problems.  Respiratory: Negative for chest tightness and cough.   Cardiovascular: Negative for chest pain.  Gastrointestinal: Negative for abdominal distention, abdominal pain and blood in stool.  Endocrine: Negative for hot flashes.  Genitourinary: Negative for difficulty urinating and frequency.   Musculoskeletal: Positive  for arthralgias, back pain and neck pain.  Skin: Negative for itching and rash.  Neurological: Negative for extremity weakness.  Hematological: Negative for adenopathy.  Psychiatric/Behavioral: Negative for confusion.    MEDICAL HISTORY:  Past Medical History:  Diagnosis Date  . Depression   . HBP (high blood pressure)   . History of bladder problems   . Memory loss   . Muscle pain   . Osteoporosis   . Reflux   . Sinus congestion   . Swelling    B/L FEET AND LEGS    SURGICAL HISTORY: Past Surgical History:  Procedure Laterality Date  . ARTERY BIOPSY Bilateral 12/01/2016   Procedure: BIOPSY TEMPORAL ARTERY;  Surgeon: Conrad Florence, MD;  Location: [place];  Service: Vascular;  Laterality: Bilateral;  . CATARACT SURGERY    . EYELID SURGERY Bilateral   . GROWTH ON FACE    . KNEE SURGERY Right     SOCIAL HISTORY: Social History   Socioeconomic History  . Marital status: Married    Spouse name: Not on file  . Number of children: Not on file  . Years of education: Not on file  . Highest education level: Not on file  Occupational History  . Not on file  Social Needs  . Financial resource strain: Not on file  . Food insecurity    Worry: Not on file    Inability: Not on file  . Transportation needs    Medical: Not on file    Non-medical: Not on file  Tobacco Use  . Smoking status: Former Smoker    Types: Cigarettes    Quit date: 01/12/1993    Years since quitting: 26.0  . Smokeless tobacco:  Former Systems developer    Types: Snuff, Chew  . Tobacco comment: DATE QUIT UNKNOWN  Substance and Sexual Activity  . Alcohol use: No  . Drug use: No  . Sexual activity: Not on file  Lifestyle  . Physical activity    Days per week: Not on file    Minutes per session: Not on file  . Stress: Not on file  Relationships  . Social Herbalist on phone: Not on file    Gets together: Not on file    Attends religious service: Not on file    Active member of club or organization:  Not on file    Attends meetings of clubs or organizations: Not on file    Relationship status: Not on file  . Intimate partner violence    Fear of current or ex partner: Not on file    Emotionally abused: Not on file    Physically abused: Not on file    Forced sexual activity: Not on file  Other Topics Concern  . Not on file  Social History Narrative  . Not on file    FAMILY HISTORY: Family History  Problem Relation Age of Onset  . Heart disease Mother   . AAA (abdominal aortic aneurysm) Mother   . Heart disease Father     ALLERGIES:  is allergic to kenalog [triamcinolone acetonide] and penicillins.  MEDICATIONS:  Current Outpatient Medications  Medication Sig Dispense Refill  . cholecalciferol (VITAMIN D3) 25 MCG (1000 UT) tablet Take 1,000 Units by mouth daily.    . cimetidine (TAGAMET) 200 MG tablet Take 200 mg by mouth 2 (two) times daily as needed (for acid reflux/indigestion.).    Marland Kitchen fluocinonide gel (LIDEX) 0.05 % APPLY GEL TO ULCERS OR AFFECTED AREA 3 TIMES DAILY AS NEEDED DURING FLARES  2  . furosemide (LASIX) 40 MG tablet Take 40-80 mg by mouth daily as needed. For fluid retention (swollen ankles)    . loratadine (CLARITIN) 10 MG tablet Take by mouth.    . predniSONE (DELTASONE) 20 MG tablet Take 5 mg by mouth daily before breakfast. Alternating 5 mg with 2.5 mg  0  . sertraline (ZOLOFT) 50 MG tablet Take 25 mg by mouth daily.  0  . telmisartan (MICARDIS) 40 MG tablet Take 40 mg by mouth daily.    . vitamin B-12 (CYANOCOBALAMIN) 1000 MCG tablet Take 1,000 mcg by mouth daily.     Marland Kitchen losartan (COZAAR) 100 MG tablet Take 100 mg by mouth daily. 1/4 tab for high blood pressure    . pregabalin (LYRICA) 75 MG capsule Take 1 capsule (75 mg total) by mouth 2 (two) times daily. (Patient not taking: Reported on 02/06/2019) 90 capsule 1   Current Facility-Administered Medications  Medication Dose Route Frequency Provider Last Rate Last Dose  . triamcinolone acetonide (KENALOG-40)  injection 20 mg  20 mg Intramuscular Once Hyatt, Max T, DPM         PHYSICAL EXAMINATION: ECOG PERFORMANCE STATUS: 1 - Symptomatic but completely ambulatory Vitals:   02/06/19 1058  BP: 137/74  Pulse: 66  Resp: 18  Temp: 98.8 F (37.1 C)   Filed Weights   02/06/19 1058  Weight: 242 lb 8 oz (110 kg)    Physical Exam Constitutional:      General: She is not in acute distress. HENT:     Head: Normocephalic and atraumatic.  Eyes:     General: No scleral icterus.    Pupils: Pupils are equal, round, and  reactive to light.  Neck:     Musculoskeletal: Normal range of motion and neck supple.  Cardiovascular:     Rate and Rhythm: Normal rate and regular rhythm.     Heart sounds: Normal heart sounds.  Pulmonary:     Effort: Pulmonary effort is normal. No respiratory distress.     Breath sounds: No wheezing.  Abdominal:     General: Bowel sounds are normal. There is no distension.     Palpations: Abdomen is soft. There is no mass.     Tenderness: There is no abdominal tenderness.  Musculoskeletal: Normal range of motion.        General: No deformity.  Skin:    General: Skin is warm and dry.     Findings: No erythema or rash.  Neurological:     Mental Status: She is alert and oriented to person, place, and time.     Cranial Nerves: No cranial nerve deficit.     Coordination: Coordination normal.  Psychiatric:        Behavior: Behavior normal.        Thought Content: Thought content normal.      LABORATORY DATA:  I have reviewed the data as listed Lab Results  Component Value Date   WBC 7.3 01/23/2019   HGB 10.6 (L) 01/23/2019   HCT 33.6 (L) 01/23/2019   MCV 92.6 01/23/2019   PLT 230 01/23/2019   Recent Labs    01/23/19 1100  NA 136  K 3.7  CL 102  CO2 27  GLUCOSE 113*  BUN 25*  CREATININE 1.53*  CALCIUM 9.0  GFRNONAA 32*  GFRAA 37*  PROT 6.8  ALBUMIN 3.7  AST 20  ALT 11  ALKPHOS 52  BILITOT 0.7   Iron/TIBC/Ferritin/ %Sat No results found for:  IRON, TIBC, FERRITIN, IRONPCTSAT      ASSESSMENT & PLAN:  1. Abnormal SPEP   2. Stage 3b chronic kidney disease   Labs are reviewed and discussed with patient. Repeat multiple myeloma panel did not reveal monoclonal protein Elevated free light chain level, with normal ratio. Discussed with patient that she does not meet criteria for diagnosing MGUS.  Patient is on chronic prednisone for temporal arteritis.  Prednisone may mask her monoclonal protein levels. Urine protein electrophoresis is pending. Recommend follow up in 1 year with repeat multiple myeloma panel.  Orders Placed This Encounter  Procedures  . CBC with Differential/Platelet    Standing Status:   Future    Standing Expiration Date:   02/06/2020  . Comprehensive metabolic panel    Standing Status:   Future    Standing Expiration Date:   02/06/2020  . Multiple Myeloma Panel (SPEP&IFE w/QIG)    Standing Status:   Future    Standing Expiration Date:   02/06/2020  . Kappa/lambda light chains    Standing Status:   Future    Standing Expiration Date:   02/06/2020    All questions were answered. The patient knows to call the clinic with any problems questions or concerns. Cay Schillings, NP  Return of visit: 6 months.  [name], MD, PhD 02/06/2019

## 2019-02-06 NOTE — Progress Notes (Signed)
Patient does not offer any problems today.  

## 2019-02-07 LAB — UPEP/UIFE/LIGHT CHAINS/TP, 24-HR UR
% BETA, Urine: 0 %
ALPHA 1 URINE: 0 %
Albumin, U: 100 %
Alpha 2, Urine: 0 %
Free Kappa Lt Chains,Ur: 21.29 mg/L (ref 0.63–113.79)
Free Kappa/Lambda Ratio: 14.01 (ref 1.03–31.76)
Free Lambda Lt Chains,Ur: 1.52 mg/L (ref 0.47–11.77)
GAMMA GLOBULIN URINE: 0 %
Total Protein, Urine-Ur/day: 153 mg/24 hr — ABNORMAL HIGH (ref 30–150)
Total Protein, Urine: 10.2 mg/dL
Total Volume: 1500

## 2019-06-27 DIAGNOSIS — R001 Bradycardia, unspecified: Secondary | ICD-10-CM | POA: Insufficient documentation

## 2019-11-11 ENCOUNTER — Other Ambulatory Visit: Payer: Self-pay | Admitting: Family

## 2019-11-11 DIAGNOSIS — U071 COVID-19: Secondary | ICD-10-CM

## 2019-11-11 NOTE — Progress Notes (Signed)
I connected by phone with Rebecca Gilmore on 11/11/2019 at 11:45 AM to discuss the potential use of a new treatment for mild to moderate COVID-19 viral infection in non-hospitalized patients.  This patient is a 79 y.o. female that meets the FDA criteria for Emergency Use Authorization of COVID monoclonal antibody casirivimab/imdevimab.  Has a (+) direct SARS-CoV-2 viral test result  Has mild or moderate COVID-19   Is NOT hospitalized due to COVID-19  Is within 10 days of symptom onset  Has at least one of the high risk factor(s) for progression to severe COVID-19 and/or hospitalization as defined in EUA.  Specific high risk criteria : Older age (>/= 79 yo), Immunosuppressive Disease or Treatment   Symptoms of body aches and cough began 11/08/19.   I have spoken and communicated the following to the patient or parent/caregiver regarding COVID monoclonal antibody treatment:  1. FDA has authorized the emergency use for the treatment of mild to moderate COVID-19 in adults and pediatric patients with positive results of direct SARS-CoV-2 viral testing who are 17 years of age and older weighing at least 40 kg, and who are at high risk for progressing to severe COVID-19 and/or hospitalization.  2. The significant known and potential risks and benefits of COVID monoclonal antibody, and the extent to which such potential risks and benefits are unknown.  3. Information on available alternative treatments and the risks and benefits of those alternatives, including clinical trials.  4. Patients treated with COVID monoclonal antibody should continue to self-isolate and use infection control measures (e.g., wear mask, isolate, social distance, avoid sharing personal items, clean and disinfect "high touch" surfaces, and frequent handwashing) according to CDC guidelines.   5. The patient or parent/caregiver has the option to accept or refuse COVID monoclonal antibody treatment.  After reviewing this  information with the patient, The patient agreed to proceed with receiving casirivimab\imdevimab infusion and will be provided a copy of the Fact sheet prior to receiving the infusion. [employer] 11/11/2019 11:45 AM

## 2019-11-12 ENCOUNTER — Ambulatory Visit (HOSPITAL_COMMUNITY)
Admission: RE | Admit: 2019-11-12 | Discharge: 2019-11-12 | Disposition: A | Discharge status: Home / Self Care | Payer: Medicare Other | Source: Ambulatory Visit | Attending: Pulmonary Disease | Admitting: Pulmonary Disease

## 2019-11-12 DIAGNOSIS — U071 COVID-19: Secondary | ICD-10-CM | POA: Diagnosis present

## 2019-11-12 DIAGNOSIS — Z23 Encounter for immunization: Secondary | ICD-10-CM | POA: Diagnosis not present

## 2019-11-12 MED ORDER — EPINEPHRINE 0.3 MG/0.3ML IJ SOAJ: 0.3000 mg | Freq: Once | INTRAMUSCULAR | Status: DC | PRN

## 2019-11-12 MED ORDER — SODIUM CHLORIDE 0.9 % IV SOLN: INTRAVENOUS | Status: DC | PRN

## 2019-11-12 MED ORDER — METHYLPREDNISOLONE SODIUM SUCC 125 MG IJ SOLR: 125.0000 mg | Freq: Once | INTRAMUSCULAR | Status: DC | PRN

## 2019-11-12 MED ORDER — DIPHENHYDRAMINE HCL 50 MG/ML IJ SOLN: 50.0000 mg | Freq: Once | INTRAMUSCULAR | Status: DC | PRN

## 2019-11-12 MED ORDER — SODIUM CHLORIDE 0.9 % IV SOLN
1200.0000 mg | Freq: Once | INTRAVENOUS | Status: AC
  Administered 2019-11-12: 1200 mg via INTRAVENOUS
  Filled 2019-11-12: qty 10

## 2019-11-12 MED ORDER — ALBUTEROL SULFATE HFA 108 (90 BASE) MCG/ACT IN AERS: 2.0000 | INHALATION_SPRAY | Freq: Once | RESPIRATORY_TRACT | Status: DC | PRN

## 2019-11-12 MED ORDER — FAMOTIDINE IN NACL 20-0.9 MG/50ML-% IV SOLN: 20.0000 mg | Freq: Once | INTRAVENOUS | Status: DC | PRN

## 2019-11-12 NOTE — Progress Notes (Signed)
  Diagnosis: COVID-19  Physician: Dr. Joya Gaskins  Procedure: [facility] Infusion [facility] Med: casirivimab\imdevimab infusion - Provided patient with casirivimab\imdevimab fact sheet for patients, parents and caregivers prior to infusion.  Complications: No immediate complications noted.  Discharge: Discharged home   Rebecca Gilmore  [place] 11/12/2019

## 2019-11-12 NOTE — Discharge Instructions (Signed)
10 Things You Can Do to Manage Your COVID-19 Symptoms at Home If you have possible or confirmed COVID-19: 1. Stay home from work and school. And stay away from other public places. If you must go out, avoid using any kind of public transportation, ridesharing, or taxis. 2. Monitor your symptoms carefully. If your symptoms get worse, call your healthcare provider immediately. 3. Get rest and stay hydrated. 4. If you have a medical appointment, call the healthcare provider ahead of time and tell them that you have or may have COVID-19. 5. For medical emergencies, call 911 and notify the dispatch personnel that you have or may have COVID-19. 6. Cover your cough and sneezes with a tissue or use the inside of your elbow. 7. Wash your hands often with soap and water for at least 20 seconds or clean your hands with an alcohol-based hand sanitizer that contains at least 60% alcohol. 8. As much as possible, stay in a specific room and away from other people in your home. Also, you should use a separate bathroom, if available. If you need to be around other people in or outside of the home, wear a mask. 9. Avoid sharing personal items with other people in your household, like dishes, towels, and bedding. 10. Clean all surfaces that are touched often, like counters, tabletops, and doorknobs. Use household cleaning sprays or wipes according to the label instructions. [email or web address] 09/07/2018 This information is not intended to replace advice given to you by your health care provider. Make sure you discuss any questions you have with your health care provider. Document Revised: 02/09/2019 Document Reviewed: 02/09/2019 Elsevier Patient Education  [employer].

## 2020-01-30 ENCOUNTER — Inpatient Hospital Stay: Payer: Medicare Other | Attending: Oncology

## 2020-01-30 DIAGNOSIS — G8929 Other chronic pain: Secondary | ICD-10-CM | POA: Diagnosis not present

## 2020-01-30 DIAGNOSIS — R779 Abnormality of plasma protein, unspecified: Secondary | ICD-10-CM | POA: Diagnosis not present

## 2020-01-30 DIAGNOSIS — M79606 Pain in leg, unspecified: Secondary | ICD-10-CM | POA: Insufficient documentation

## 2020-01-30 DIAGNOSIS — I129 Hypertensive chronic kidney disease with stage 1 through stage 4 chronic kidney disease, or unspecified chronic kidney disease: Secondary | ICD-10-CM | POA: Diagnosis not present

## 2020-01-30 DIAGNOSIS — M2550 Pain in unspecified joint: Secondary | ICD-10-CM | POA: Diagnosis not present

## 2020-01-30 DIAGNOSIS — Z7952 Long term (current) use of systemic steroids: Secondary | ICD-10-CM | POA: Insufficient documentation

## 2020-01-30 DIAGNOSIS — N1832 Chronic kidney disease, stage 3b: Secondary | ICD-10-CM | POA: Diagnosis not present

## 2020-01-30 DIAGNOSIS — R778 Other specified abnormalities of plasma proteins: Secondary | ICD-10-CM

## 2020-01-30 DIAGNOSIS — Z87891 Personal history of nicotine dependence: Secondary | ICD-10-CM | POA: Insufficient documentation

## 2020-01-30 LAB — CBC WITH DIFFERENTIAL/PLATELET
Abs Immature Granulocytes: 0.02 10*3/uL (ref 0.00–0.07)
Basophils Absolute: 0 10*3/uL (ref 0.0–0.1)
Basophils Relative: 1 %
Eosinophils Absolute: 0.2 10*3/uL (ref 0.0–0.5)
Eosinophils Relative: 3 %
HCT: 36 % (ref 36.0–46.0)
Hemoglobin: 11.7 g/dL — ABNORMAL LOW (ref 12.0–15.0)
Immature Granulocytes: 0 %
Lymphocytes Relative: 37 %
Lymphs Abs: 2 10*3/uL (ref 0.7–4.0)
MCH: 28.7 pg (ref 26.0–34.0)
MCHC: 32.5 g/dL (ref 30.0–36.0)
MCV: 88.5 fL (ref 80.0–100.0)
Monocytes Absolute: 0.6 10*3/uL (ref 0.1–1.0)
Monocytes Relative: 10 %
Neutro Abs: 2.6 10*3/uL (ref 1.7–7.7)
Neutrophils Relative %: 49 %
Platelets: 228 10*3/uL (ref 150–400)
RBC: 4.07 MIL/uL (ref 3.87–5.11)
RDW: 13.5 % (ref 11.5–15.5)
WBC: 5.4 10*3/uL (ref 4.0–10.5)
nRBC: 0 % (ref 0.0–0.2)

## 2020-01-30 LAB — COMPREHENSIVE METABOLIC PANEL
ALT: 13 U/L (ref 0–44)
AST: 22 U/L (ref 15–41)
Albumin: 3.9 g/dL (ref 3.5–5.0)
Alkaline Phosphatase: 39 U/L (ref 38–126)
Anion gap: 12 (ref 5–15)
BUN: 23 mg/dL (ref 8–23)
CO2: 28 mmol/L (ref 22–32)
Calcium: 9.5 mg/dL (ref 8.9–10.3)
Chloride: 99 mmol/L (ref 98–111)
Creatinine, Ser: 1.7 mg/dL — ABNORMAL HIGH (ref 0.44–1.00)
GFR, Estimated: 30 mL/min — ABNORMAL LOW (ref >60)
Glucose, Bld: 115 mg/dL — ABNORMAL HIGH (ref 70–99)
Potassium: 3.4 mmol/L — ABNORMAL LOW (ref 3.5–5.1)
Sodium: 139 mmol/L (ref 135–145)
Total Bilirubin: 0.9 mg/dL (ref 0.3–1.2)
Total Protein: 7.1 g/dL (ref 6.5–8.1)

## 2020-01-31 LAB — KAPPA/LAMBDA LIGHT CHAINS
Kappa free light chain: 94.7 mg/L — ABNORMAL HIGH (ref 3.3–19.4)
Kappa, lambda light chain ratio: 2.84 — ABNORMAL HIGH (ref 0.26–1.65)
Lambda free light chains: 33.3 mg/L — ABNORMAL HIGH (ref 5.7–26.3)

## 2020-02-02 LAB — MULTIPLE MYELOMA PANEL, SERUM
Albumin SerPl Elph-Mcnc: 3.3 g/dL (ref 2.9–4.4)
Albumin/Glob SerPl: 1.2 (ref 0.7–1.7)
Alpha 1: 0.3 g/dL (ref 0.0–0.4)
Alpha2 Glob SerPl Elph-Mcnc: 0.8 g/dL (ref 0.4–1.0)
B-Globulin SerPl Elph-Mcnc: 0.9 g/dL (ref 0.7–1.3)
Gamma Glob SerPl Elph-Mcnc: 0.9 g/dL (ref 0.4–1.8)
Globulin, Total: 2.9 g/dL (ref 2.2–3.9)
IgA: 285 mg/dL (ref 64–422)
IgG (Immunoglobin G), Serum: 928 mg/dL (ref 586–1602)
IgM (Immunoglobulin M), Srm: 120 mg/dL (ref 26–217)
Total Protein ELP: 6.2 g/dL (ref 6.0–8.5)

## 2020-02-06 ENCOUNTER — Inpatient Hospital Stay (HOSPITAL_BASED_OUTPATIENT_CLINIC_OR_DEPARTMENT_OTHER): Payer: Medicare Other | Admitting: Oncology

## 2020-02-06 ENCOUNTER — Encounter: Payer: Self-pay | Admitting: Oncology

## 2020-02-06 VITALS — BP 122/82 | HR 62 | Temp 98.3°F | Resp 18 | Wt 244.6 lb

## 2020-02-06 DIAGNOSIS — R778 Other specified abnormalities of plasma proteins: Secondary | ICD-10-CM | POA: Diagnosis not present

## 2020-02-06 DIAGNOSIS — R779 Abnormality of plasma protein, unspecified: Secondary | ICD-10-CM | POA: Diagnosis not present

## 2020-02-06 NOTE — Progress Notes (Signed)
Patient here for follow up. No new concerns voiced.

## 2020-02-06 NOTE — Progress Notes (Signed)
Hematology/Oncology Consult note [facility] [phone] [phone]   Patient Care Team: Philmore Pali, NP as PCP - General (Nurse Practitioner)  REFERRING PROVIDER: Philmore Pali, NP  CHIEF COMPLAINTS/REASON FOR VISIT:  Evaluation of abnormal SPEP  HISTORY OF PRESENTING ILLNESS:  Rebecca Gilmore is a  79 y.o.  female with PMH listed below who was referred to me for evaluation of anemia Reviewed patient's recent labs that was done.  10/17/2018 labs revealed anemia with hemoglobin of 12.4 Free kappa light chain 5.35, lambda light chain 2.23, kappa lambda ratio 2.31. SPEP showed faint monoclonal component typed as IgG kappa.  Faint band suspicious for monoclonal components No aggravating or improving factors.  Associated signs and symptoms: Chronic fatigue.  No exacerbating or alleviating factors. . Patient has chronic kidney disease stage III follow-up with [facility] nephrology Dr. 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  Has been on chronic steroids.She reports her current prednisone regimen is 5 mg alternating with 2.5 mg. Chronic joint, neck, back pains.  INTERVAL HISTORY Rebecca Gilmore is a 79 y.o. female who has above history reviewed by me today presents for follow up visit for management of abnormal SPEP. 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 Patient continues to have chronic joint pain and lower extremity pain.   Review of Systems  Constitutional: Positive for fatigue. Negative for appetite change, chills and fever.  HENT:   Negative for hearing loss and voice change.   Eyes: Negative for eye problems.  Respiratory: Negative for chest tightness and cough.   Cardiovascular: Negative for chest pain.  Gastrointestinal: Negative for abdominal distention, abdominal pain and blood in stool.  Endocrine: Negative for hot flashes.    Genitourinary: Negative for difficulty urinating and frequency.   Musculoskeletal: Positive for arthralgias, back pain and neck pain.  Skin: Negative for itching and rash.  Neurological: Negative for extremity weakness.  Hematological: Negative for adenopathy.  Psychiatric/Behavioral: Negative for confusion.    MEDICAL HISTORY:  Past Medical History:  Diagnosis Date   Depression    HBP (high blood pressure)    History of bladder problems    Memory loss    Muscle pain    Osteoporosis    Reflux    Sinus congestion    Swelling    B/L FEET AND LEGS    SURGICAL HISTORY: Past Surgical History:  Procedure Laterality Date   ARTERY BIOPSY Bilateral 12/01/2016   Procedure: BIOPSY TEMPORAL ARTERY;  Surgeon: Conrad Woodlake, MD;  Location: [facility];  Service: Vascular;  Laterality: Bilateral;   CATARACT SURGERY     EYELID SURGERY Bilateral    GROWTH ON FACE     KNEE SURGERY Right     SOCIAL HISTORY: Social History   Socioeconomic History   Marital status: Married    Spouse name: Not on file   Number of children: Not on file   Years of education: Not on file   Highest education level: Not on file  Occupational History   Not on file  Tobacco Use   Smoking status: Former Smoker    Types: Cigarettes    Quit date: 01/12/1993    Years since quitting: 27.0   Smokeless tobacco: Former Systems developer    Types: Snuff, Chew   Tobacco comment: DATE QUIT UNKNOWN  Vaping Use   Vaping Use: Never used  Substance and Sexual Activity   Alcohol use: No   Drug use:  No   Sexual activity: Not on file  Other Topics Concern   Not on file  Social History Narrative   Not on file   Social Determinants of Health   Financial Resource Strain:    Difficulty of Paying Living Expenses: Not on file  Food Insecurity:    Worried About [place] in the Last Year: Not on file   Ran Out of Food in the Last Year: Not on file  Transportation Needs:    Lack of  Transportation (Medical): Not on file   Lack of Transportation (Non-Medical): Not on file  Physical Activity:    Days of Exercise per Week: Not on file   Minutes of Exercise per Session: Not on file  Stress:    Feeling of Stress : Not on file  Social Connections:    Frequency of Communication with Friends and Family: Not on file   Frequency of Social Gatherings with Friends and Family: Not on file   Attends Religious Services: Not on file   Active Member of Clubs or Organizations: Not on file   Attends Archivist Meetings: Not on file   Marital Status: Not on file  Intimate Partner Violence:    Fear of Current or Ex-Partner: Not on file   Emotionally Abused: Not on file   Physically Abused: Not on file   Sexually Abused: Not on file    FAMILY HISTORY: Family History  Problem Relation Age of Onset   Heart disease Mother    AAA (abdominal aortic aneurysm) Mother    Heart disease Father     ALLERGIES:  is allergic to kenalog [triamcinolone acetonide] and penicillins.  MEDICATIONS:  Current Outpatient Medications  Medication Sig Dispense Refill   cholecalciferol (VITAMIN D3) 25 MCG (1000 UT) tablet Take 1,000 Units by mouth daily.     cimetidine (TAGAMET) 200 MG tablet Take 200 mg by mouth 2 (two) times daily as needed (for acid reflux/indigestion.).     diclofenac Sodium (VOLTAREN) 1 % GEL APPLY 4 GRAMS UP TO 4 TIMES A DAY TO KNEE OR ANKLE JOINT AS NEEDED     furosemide (LASIX) 40 MG tablet Take 40-80 mg by mouth daily as needed. For fluid retention (swollen ankles)     loratadine (CLARITIN) 10 MG tablet Take by mouth.     predniSONE (DELTASONE) 20 MG tablet Take 2.5 mg by mouth every other day.   0   pregabalin (LYRICA) 75 MG capsule Take 1 capsule (75 mg total) by mouth 2 (two) times daily. 90 capsule 1   rosuvastatin (CRESTOR) 5 MG tablet TAKE 1 TABLET BY MOUTH AT BEDTIME FOR HIGH CHOLESTEROL     sertraline (ZOLOFT) 50 MG tablet Take 25 mg  by mouth daily.  0   vitamin B-12 (CYANOCOBALAMIN) 1000 MCG tablet Take 1,000 mcg by mouth daily.      fluocinonide gel (LIDEX) 0.05 % APPLY GEL TO ULCERS OR AFFECTED AREA 3 TIMES DAILY AS NEEDED DURING FLARES (Patient not taking: Reported on 02/06/2020)  2   telmisartan (MICARDIS) 40 MG tablet Take 40 mg by mouth daily. (Patient not taking: Reported on 02/06/2020)     Current Facility-Administered Medications  Medication Dose Route Frequency Provider Last Rate Last Admin   triamcinolone acetonide (KENALOG-40) injection 20 mg  20 mg Intramuscular Once Hyatt, Max T, DPM         PHYSICAL EXAMINATION: ECOG PERFORMANCE STATUS: 1 - Symptomatic but completely ambulatory Vitals:   02/06/20 1029  BP: 122/82  Pulse: 62  Resp: 18  Temp: 98.3 F (36.8 C)   Filed Weights   02/06/20 1029  Weight: 244 lb 9.6 oz (110.9 kg)    Physical Exam Constitutional:      General: She is not in acute distress. HENT:     Head: Normocephalic and atraumatic.  Eyes:     General: No scleral icterus.    Pupils: Pupils are equal, round, and reactive to light.  Cardiovascular:     Rate and Rhythm: Normal rate and regular rhythm.     Heart sounds: Normal heart sounds.  Pulmonary:     Effort: Pulmonary effort is normal. No respiratory distress.     Breath sounds: No wheezing.  Abdominal:     General: Bowel sounds are normal. There is no distension.     Palpations: Abdomen is soft. There is no mass.     Tenderness: There is no abdominal tenderness.  Musculoskeletal:        General: No deformity. Normal range of motion.     Cervical back: Normal range of motion and neck supple.  Skin:    General: Skin is warm and dry.     Findings: No erythema or rash.  Neurological:     Mental Status: She is alert and oriented to person, place, and time.     Cranial Nerves: No cranial nerve deficit.     Coordination: Coordination normal.  Psychiatric:        Behavior: Behavior normal.        Thought Content:  Thought content normal.      LABORATORY DATA:  I have reviewed the data as listed Lab Results  Component Value Date   WBC 5.4 01/30/2020   HGB 11.7 (L) 01/30/2020   HCT 36.0 01/30/2020   MCV 88.5 01/30/2020   PLT 228 01/30/2020   Recent Labs    01/30/20 1051  NA 139  K 3.4*  CL 99  CO2 28  GLUCOSE 115*  BUN 23  CREATININE 1.70*  CALCIUM 9.5  GFRNONAA 30*  PROT 7.1  ALBUMIN 3.9  AST 22  ALT 13  ALKPHOS 39  BILITOT 0.9   Iron/TIBC/Ferritin/ %Sat No results found for: IRON, TIBC, FERRITIN, IRONPCTSAT      ASSESSMENT & PLAN:  1. Abnormal SPEP   2. Stage 3b chronic kidney disease ([place])    Labs reviewed and discussed with patient. Multiple myeloma panel showed no M protein. Continues to have elevated free light chain level and now also increased ratio. This is probably secondary to chronic inflammation as well as chronic kidney disease. Patient has previously done urine protein electrophoresis which also showed no M protein. I will hold off additional work-up at this point.  She does not meet criteria for MGUS. Patient will be discharged. Should she experiences new symptoms or concerns of hematological problems, happy to see her again in the clinic.  All questions were answered. The patient knows to call the clinic with any problems questions or concerns. Cay Schillings, NP  Return of visit: 6 months.  [name], MD, PhD 02/06/2020

## 2020-07-15 DIAGNOSIS — M316 Other giant cell arteritis: Secondary | ICD-10-CM | POA: Diagnosis not present

## 2020-07-15 DIAGNOSIS — E782 Mixed hyperlipidemia: Secondary | ICD-10-CM | POA: Diagnosis not present

## 2020-07-15 DIAGNOSIS — E559 Vitamin D deficiency, unspecified: Secondary | ICD-10-CM | POA: Diagnosis not present

## 2020-07-15 DIAGNOSIS — Z139 Encounter for screening, unspecified: Secondary | ICD-10-CM | POA: Diagnosis not present

## 2020-07-15 DIAGNOSIS — N1832 Chronic kidney disease, stage 3b: Secondary | ICD-10-CM | POA: Diagnosis not present

## 2020-07-15 DIAGNOSIS — R600 Localized edema: Secondary | ICD-10-CM | POA: Diagnosis not present

## 2020-08-06 DIAGNOSIS — M316 Other giant cell arteritis: Secondary | ICD-10-CM | POA: Diagnosis not present

## 2020-08-06 DIAGNOSIS — N1832 Chronic kidney disease, stage 3b: Secondary | ICD-10-CM | POA: Diagnosis not present

## 2020-10-01 DIAGNOSIS — Z79899 Other long term (current) drug therapy: Secondary | ICD-10-CM | POA: Diagnosis not present

## 2020-10-01 DIAGNOSIS — M316 Other giant cell arteritis: Secondary | ICD-10-CM | POA: Diagnosis not present

## 2020-11-05 DIAGNOSIS — M316 Other giant cell arteritis: Secondary | ICD-10-CM | POA: Diagnosis not present

## 2021-01-21 DIAGNOSIS — M316 Other giant cell arteritis: Secondary | ICD-10-CM | POA: Diagnosis not present

## 2021-01-21 DIAGNOSIS — Z79899 Other long term (current) drug therapy: Secondary | ICD-10-CM | POA: Diagnosis not present

## 2021-01-27 DIAGNOSIS — R202 Paresthesia of skin: Secondary | ICD-10-CM | POA: Diagnosis not present

## 2021-01-27 DIAGNOSIS — M316 Other giant cell arteritis: Secondary | ICD-10-CM | POA: Diagnosis not present

## 2021-01-27 DIAGNOSIS — M810 Age-related osteoporosis without current pathological fracture: Secondary | ICD-10-CM | POA: Diagnosis not present

## 2021-01-27 DIAGNOSIS — N1832 Chronic kidney disease, stage 3b: Secondary | ICD-10-CM | POA: Diagnosis not present

## 2021-01-27 DIAGNOSIS — R600 Localized edema: Secondary | ICD-10-CM | POA: Diagnosis not present

## 2021-01-27 DIAGNOSIS — E782 Mixed hyperlipidemia: Secondary | ICD-10-CM | POA: Diagnosis not present

## 2021-02-13 DIAGNOSIS — E785 Hyperlipidemia, unspecified: Secondary | ICD-10-CM | POA: Diagnosis not present

## 2021-02-13 DIAGNOSIS — Z9181 History of falling: Secondary | ICD-10-CM | POA: Diagnosis not present

## 2021-02-13 DIAGNOSIS — Z0000 Encounter for general adult medical examination without abnormal findings: Secondary | ICD-10-CM | POA: Diagnosis not present

## 2021-02-17 IMAGING — MR MR HEEL *R* W/O CM
5 series · 40 of 40 positions shown · non-contrast
Comparison: None.

CLINICAL DATA: Chronic ankle pain

EXAM:
MR OF THE RIGHT HEEL WITHOUT CONTRAST
TECHNIQUE: Multiplanar, multisequence MR imaging of the right heel was
performed. No intravenous contrast was administered.

[Series 3: PD fat-sat · axial · right · 3.0mm · 0.50mm/px · z∈[-161,-21]mm · 11 of 36 slices shown]
[im 1/36]
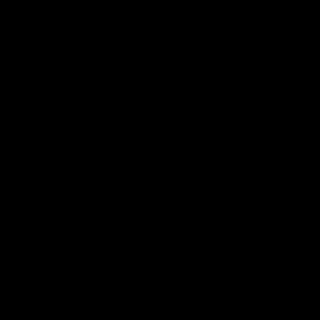
[im 4/36]
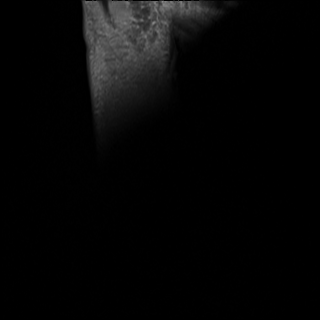
[im 8/36]
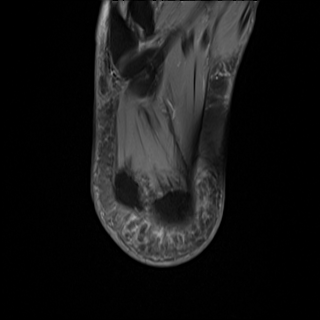
[im 11/36]
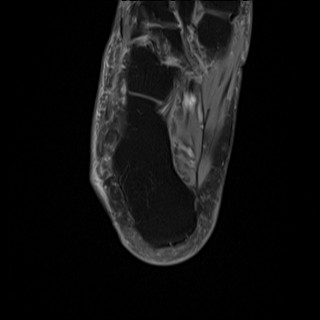
[im 15/36]
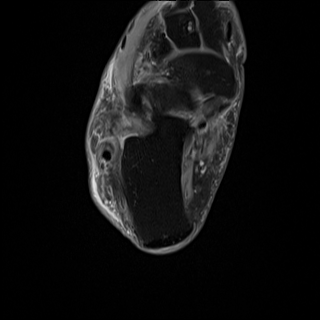
[im 18/36]
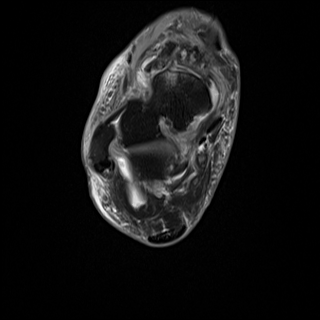
[im 22/36]
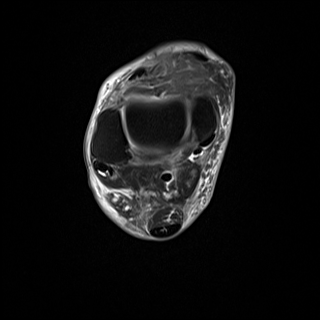
[im 25/36]
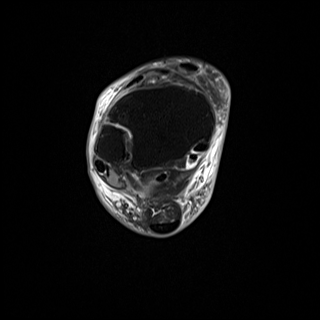
[im 29/36]
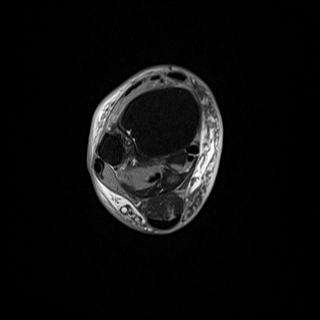
[im 32/36]
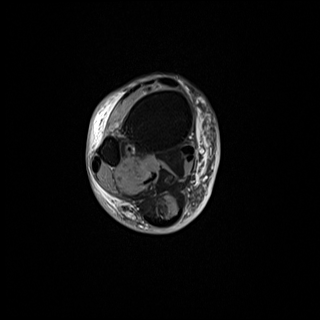
[im 36/36]
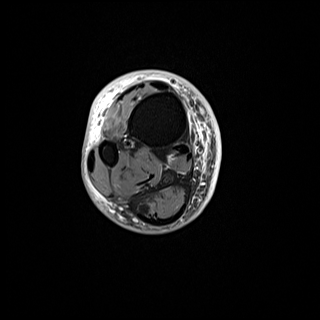

[Series 4: T2 fat-sat · axial · right · 3.0mm · 0.50mm/px · z∈[-161,-21]mm · 10 of 36 slices shown (1 of 2)]
[im 1/36]
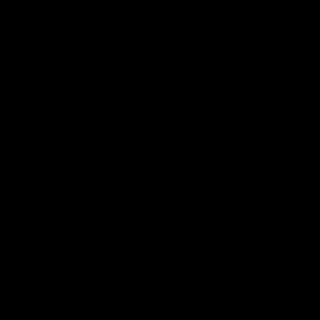
[im 4/36]
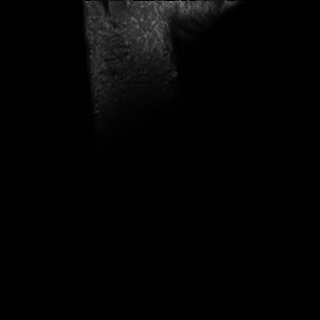
[im 8/36]
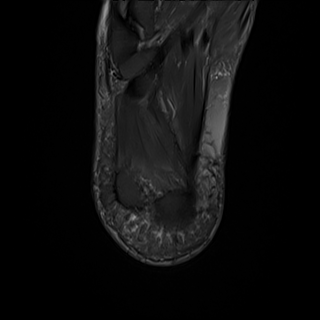
[im 12/36]
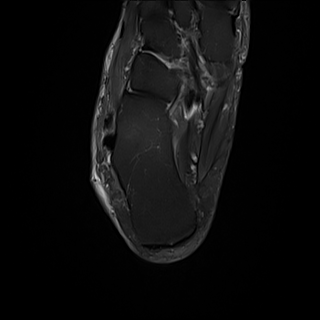
[im 16/36]
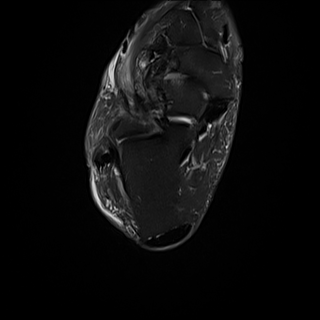
[im 20/36]
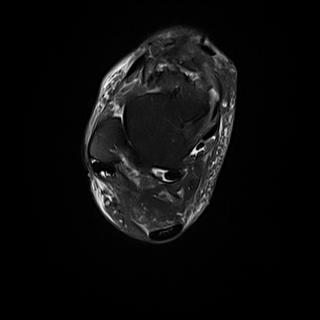
[im 24/36]
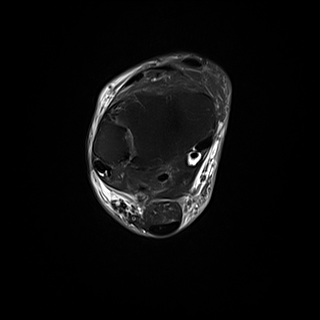
[im 28/36]
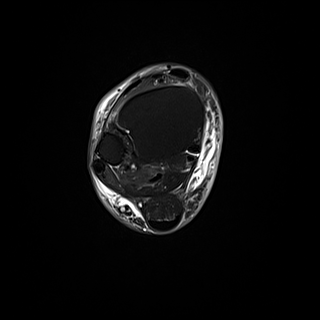
[im 32/36]
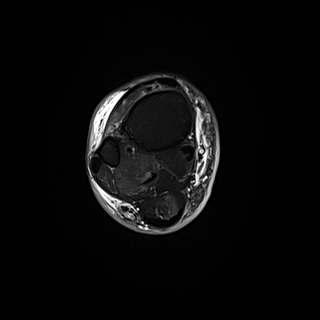
[im 36/36]
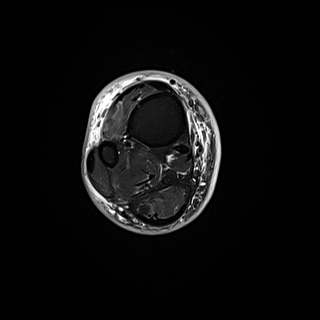

[Series 5: T2 fat-sat · coronal · right · 3.0mm · 0.62mm/px · 9 of 34 slices shown (2 of 2)]
[im 1/34]
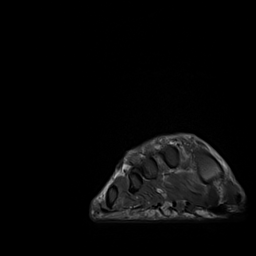
[im 5/34]
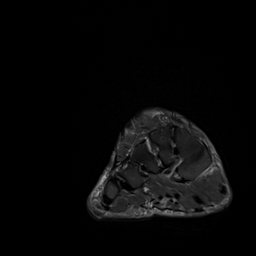
[im 9/34]
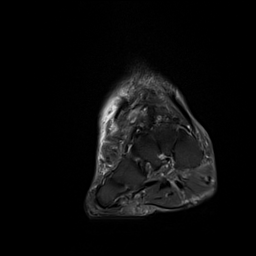
[im 13/34]
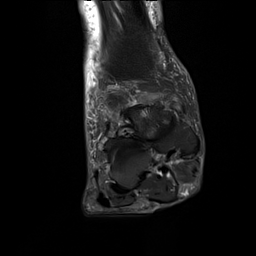
[im 17/34]
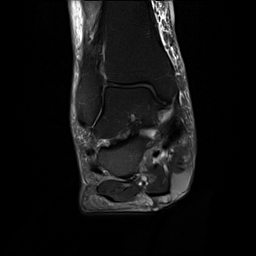
[im 21/34]
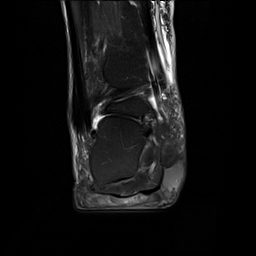
[im 25/34]
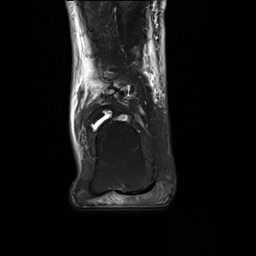
[im 29/34]
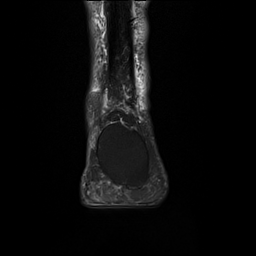
[im 34/34]
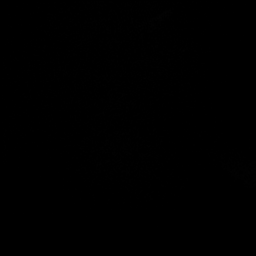

[Series 6: T1 · sagittal · right · 4.0mm · 0.70mm/px · 5 of 19 slices shown]
[im 1/19]
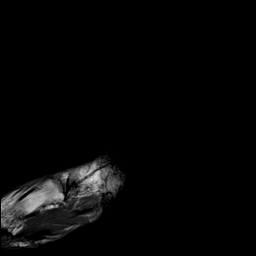
[im 5/19]
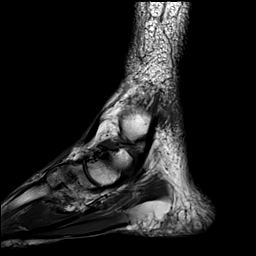
[im 10/19]
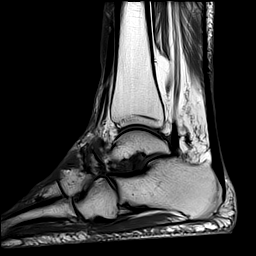
[im 14/19]
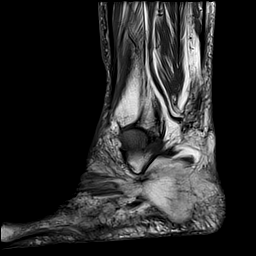
[im 19/19]
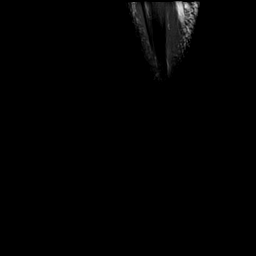

[Series 7: STIR · sagittal · right · 4.0mm · 0.35mm/px · 5 of 19 slices shown]
[im 1/19]
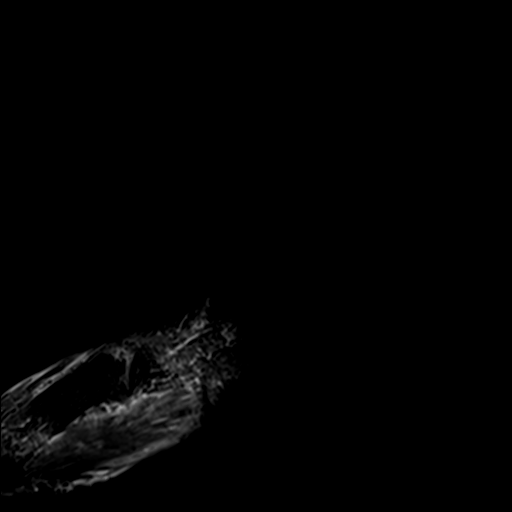
[im 5/19]
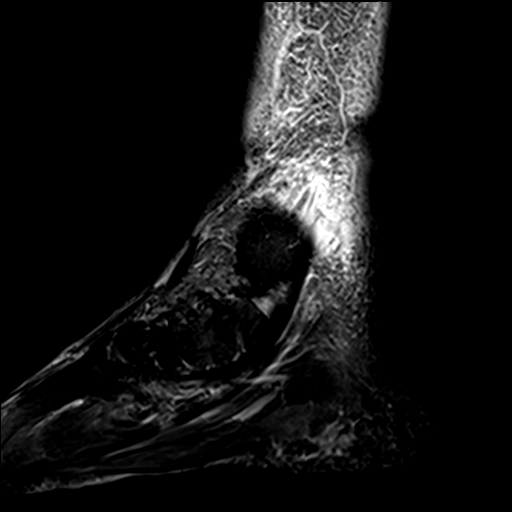
[im 10/19]
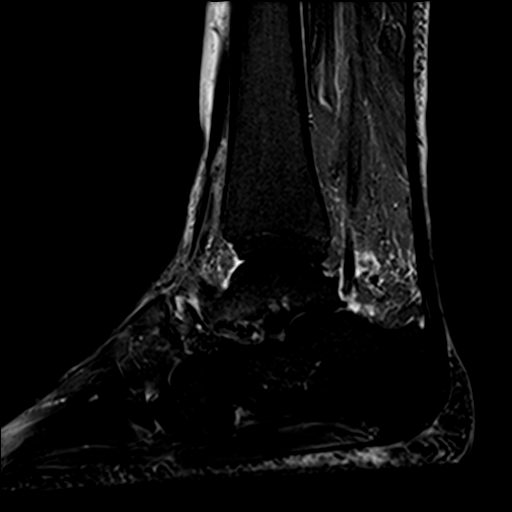
[im 14/19]
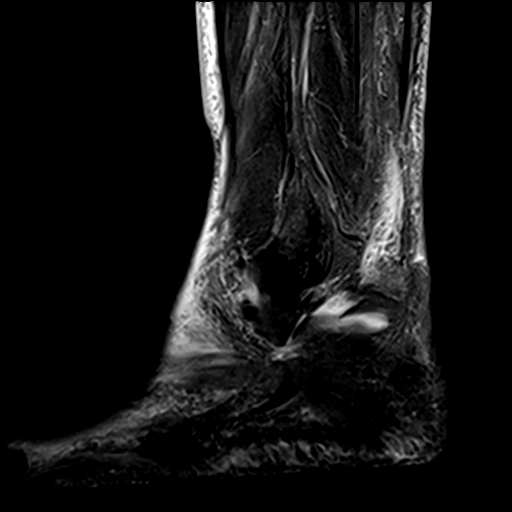
[im 19/19]
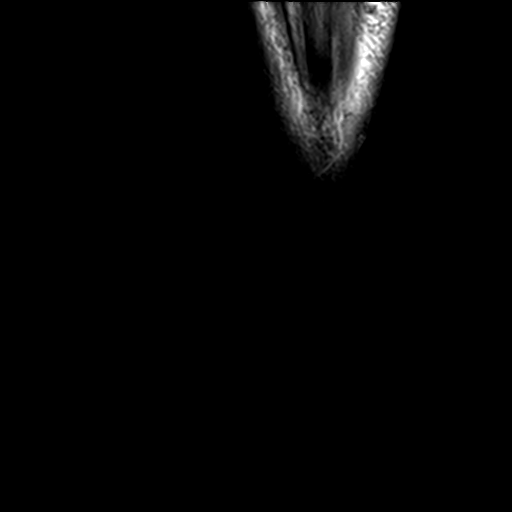

[40 of 40 positions shown; findings below may reference images not displayed]

FINDINGS: TENDONS

Peroneal: Peroneal longus tendon intact. Small fluid seen
surrounding the peroneal tendons. There is a longitudinal split tear
the peroneal brevis tendon the and with a normal distal insertion
site.

Posteromedial: Posterior tibial tendon intact. Flexor hallucis
longus tendon intact. Flexor digitorum longus tendon intact. Small
amount of fluid seen surrounding the posterior tibialis flexor
digitorum flexor hallucis longus tendons.

Anterior: Tibialis anterior tendon intact. Extensor hallucis longus
tendon intact Extensor digitorum longus tendon intact.

Achilles:  Intact.

Plantar Fascia: Intact.

LIGAMENTS

Lateral: Anterior talofibular ligament intact. Calcaneofibular
ligament intact. Posterior talofibular ligament intact. Anterior and
posterior tibiofibular ligaments intact.

Medial: There is heterogeneous signal seen the deltoid ligament,
however it is intact. Spring ligament intact.

CARTILAGE

Ankle Joint: No joint effusion. Normal ankle mortise. No chondral
defect.

Subtalar Joints/Sinus Tarsi: Normal subtalar joints. No subtalar
joint effusion. Normal sinus tarsi. Mild edema seen within the sinus
tarsi.

Bones: There is advanced talonavicular joint osteoarthritis with
diffuse joint space loss subchondral cystic changes and large dorsal
fragmented osteophyte. There is sclerosis and slight flattening of
the superior portion of the navicular. A hindfoot valgus deformity
is noted.

Soft Tissue: There is increased signal seen throughout the heel pad,
consistent with hepatic formation. Mild retrocalcaneal soft tissue
edema seen. Diffuse dorsal subcutaneous edema is noted. There is
also a mild increased STIR signal noted within the extensor
musculature surrounding the forefoot.
IMPRESSION: 1. Advanced talonavicular joint osteoarthritis with superior dorsal
fragmented osteophyte
2. Hindfoot valgus deformity
3. Longitudinal split tear of the peroneal brevis tendon at the
level of the hindfoot within normal distal insertion
4. Posterior tibialis, flexor digitorum, flexor hallucis longus
tenosynovitis
5. Heel pad inflammation
6. Mild muscular edema involving the extensor musculature.
7. Intrasubstance degeneration of the deltoid

## 2021-03-05 DIAGNOSIS — E785 Hyperlipidemia, unspecified: Secondary | ICD-10-CM | POA: Diagnosis not present

## 2021-03-05 DIAGNOSIS — I214 Non-ST elevation (NSTEMI) myocardial infarction: Secondary | ICD-10-CM | POA: Insufficient documentation

## 2021-03-05 DIAGNOSIS — I2723 Pulmonary hypertension due to lung diseases and hypoxia: Secondary | ICD-10-CM | POA: Diagnosis not present

## 2021-03-05 DIAGNOSIS — G928 Other toxic encephalopathy: Secondary | ICD-10-CM | POA: Diagnosis not present

## 2021-03-05 DIAGNOSIS — D62 Acute posthemorrhagic anemia: Secondary | ICD-10-CM | POA: Diagnosis not present

## 2021-03-05 DIAGNOSIS — R0789 Other chest pain: Secondary | ICD-10-CM | POA: Diagnosis not present

## 2021-03-05 DIAGNOSIS — E7800 Pure hypercholesterolemia, unspecified: Secondary | ICD-10-CM | POA: Diagnosis not present

## 2021-03-05 DIAGNOSIS — Z20822 Contact with and (suspected) exposure to covid-19: Secondary | ICD-10-CM | POA: Diagnosis not present

## 2021-03-05 DIAGNOSIS — A419 Sepsis, unspecified organism: Secondary | ICD-10-CM | POA: Diagnosis not present

## 2021-03-05 DIAGNOSIS — R0609 Other forms of dyspnea: Secondary | ICD-10-CM | POA: Diagnosis not present

## 2021-03-05 DIAGNOSIS — I509 Heart failure, unspecified: Secondary | ICD-10-CM | POA: Insufficient documentation

## 2021-03-05 DIAGNOSIS — I129 Hypertensive chronic kidney disease with stage 1 through stage 4 chronic kidney disease, or unspecified chronic kidney disease: Secondary | ICD-10-CM | POA: Diagnosis not present

## 2021-03-05 DIAGNOSIS — I080 Rheumatic disorders of both mitral and aortic valves: Secondary | ICD-10-CM | POA: Diagnosis not present

## 2021-03-05 DIAGNOSIS — R479 Unspecified speech disturbances: Secondary | ICD-10-CM | POA: Diagnosis not present

## 2021-03-05 DIAGNOSIS — N271 Small kidney, bilateral: Secondary | ICD-10-CM | POA: Diagnosis not present

## 2021-03-05 DIAGNOSIS — I4510 Unspecified right bundle-branch block: Secondary | ICD-10-CM | POA: Diagnosis not present

## 2021-03-05 DIAGNOSIS — Z87891 Personal history of nicotine dependence: Secondary | ICD-10-CM | POA: Diagnosis not present

## 2021-03-05 DIAGNOSIS — I4819 Other persistent atrial fibrillation: Secondary | ICD-10-CM | POA: Diagnosis not present

## 2021-03-05 DIAGNOSIS — A4150 Gram-negative sepsis, unspecified: Secondary | ICD-10-CM | POA: Diagnosis not present

## 2021-03-05 DIAGNOSIS — Z7952 Long term (current) use of systemic steroids: Secondary | ICD-10-CM | POA: Diagnosis not present

## 2021-03-05 DIAGNOSIS — I6523 Occlusion and stenosis of bilateral carotid arteries: Secondary | ICD-10-CM | POA: Diagnosis not present

## 2021-03-05 DIAGNOSIS — N189 Chronic kidney disease, unspecified: Secondary | ICD-10-CM | POA: Diagnosis not present

## 2021-03-05 DIAGNOSIS — R4789 Other speech disturbances: Secondary | ICD-10-CM | POA: Diagnosis not present

## 2021-03-05 DIAGNOSIS — Z79899 Other long term (current) drug therapy: Secondary | ICD-10-CM | POA: Diagnosis not present

## 2021-03-05 DIAGNOSIS — J101 Influenza due to other identified influenza virus with other respiratory manifestations: Secondary | ICD-10-CM | POA: Insufficient documentation

## 2021-03-05 DIAGNOSIS — Z880 Allergy status to penicillin: Secondary | ICD-10-CM | POA: Diagnosis not present

## 2021-03-05 DIAGNOSIS — R778 Other specified abnormalities of plasma proteins: Secondary | ICD-10-CM | POA: Diagnosis not present

## 2021-03-05 DIAGNOSIS — R112 Nausea with vomiting, unspecified: Secondary | ICD-10-CM | POA: Diagnosis not present

## 2021-03-05 DIAGNOSIS — R509 Fever, unspecified: Secondary | ICD-10-CM | POA: Diagnosis not present

## 2021-03-05 DIAGNOSIS — E875 Hyperkalemia: Secondary | ICD-10-CM | POA: Diagnosis not present

## 2021-03-05 DIAGNOSIS — R059 Cough, unspecified: Secondary | ICD-10-CM | POA: Diagnosis not present

## 2021-03-05 DIAGNOSIS — R4701 Aphasia: Secondary | ICD-10-CM | POA: Diagnosis not present

## 2021-03-05 DIAGNOSIS — I5033 Acute on chronic diastolic (congestive) heart failure: Secondary | ICD-10-CM | POA: Diagnosis not present

## 2021-03-05 DIAGNOSIS — K922 Gastrointestinal hemorrhage, unspecified: Secondary | ICD-10-CM | POA: Diagnosis not present

## 2021-03-05 DIAGNOSIS — I110 Hypertensive heart disease with heart failure: Secondary | ICD-10-CM | POA: Diagnosis not present

## 2021-03-05 DIAGNOSIS — Z9889 Other specified postprocedural states: Secondary | ICD-10-CM | POA: Diagnosis not present

## 2021-03-05 DIAGNOSIS — R519 Headache, unspecified: Secondary | ICD-10-CM | POA: Diagnosis not present

## 2021-03-05 DIAGNOSIS — N179 Acute kidney failure, unspecified: Secondary | ICD-10-CM | POA: Diagnosis not present

## 2021-03-05 DIAGNOSIS — I452 Bifascicular block: Secondary | ICD-10-CM | POA: Diagnosis not present

## 2021-03-05 DIAGNOSIS — J9601 Acute respiratory failure with hypoxia: Secondary | ICD-10-CM | POA: Diagnosis not present

## 2021-03-05 DIAGNOSIS — I2510 Atherosclerotic heart disease of native coronary artery without angina pectoris: Secondary | ICD-10-CM | POA: Diagnosis not present

## 2021-03-05 DIAGNOSIS — I272 Pulmonary hypertension, unspecified: Secondary | ICD-10-CM | POA: Diagnosis not present

## 2021-03-05 DIAGNOSIS — I517 Cardiomegaly: Secondary | ICD-10-CM | POA: Diagnosis not present

## 2021-03-05 DIAGNOSIS — I340 Nonrheumatic mitral (valve) insufficiency: Secondary | ICD-10-CM | POA: Diagnosis not present

## 2021-03-05 DIAGNOSIS — I4892 Unspecified atrial flutter: Secondary | ICD-10-CM | POA: Diagnosis not present

## 2021-03-05 DIAGNOSIS — Z28310 Unvaccinated for covid-19: Secondary | ICD-10-CM | POA: Diagnosis not present

## 2021-03-05 DIAGNOSIS — R0902 Hypoxemia: Secondary | ICD-10-CM | POA: Diagnosis not present

## 2021-03-05 DIAGNOSIS — R0602 Shortness of breath: Secondary | ICD-10-CM | POA: Diagnosis not present

## 2021-03-05 DIAGNOSIS — R7881 Bacteremia: Secondary | ICD-10-CM | POA: Diagnosis not present

## 2021-03-05 DIAGNOSIS — N183 Chronic kidney disease, stage 3 unspecified: Secondary | ICD-10-CM | POA: Diagnosis not present

## 2021-03-05 DIAGNOSIS — A4159 Other Gram-negative sepsis: Secondary | ICD-10-CM | POA: Diagnosis not present

## 2021-03-05 DIAGNOSIS — I4891 Unspecified atrial fibrillation: Secondary | ICD-10-CM | POA: Diagnosis not present

## 2021-03-05 DIAGNOSIS — I350 Nonrheumatic aortic (valve) stenosis: Secondary | ICD-10-CM | POA: Diagnosis not present

## 2021-03-05 DIAGNOSIS — Z888 Allergy status to other drugs, medicaments and biological substances status: Secondary | ICD-10-CM | POA: Diagnosis not present

## 2021-03-05 DIAGNOSIS — D649 Anemia, unspecified: Secondary | ICD-10-CM | POA: Diagnosis not present

## 2021-03-05 DIAGNOSIS — B9689 Other specified bacterial agents as the cause of diseases classified elsewhere: Secondary | ICD-10-CM | POA: Diagnosis not present

## 2021-03-05 DIAGNOSIS — J811 Chronic pulmonary edema: Secondary | ICD-10-CM | POA: Diagnosis not present

## 2021-03-05 DIAGNOSIS — J90 Pleural effusion, not elsewhere classified: Secondary | ICD-10-CM | POA: Diagnosis not present

## 2021-03-05 DIAGNOSIS — R710 Precipitous drop in hematocrit: Secondary | ICD-10-CM | POA: Diagnosis not present

## 2021-03-05 DIAGNOSIS — B964 Proteus (mirabilis) (morganii) as the cause of diseases classified elsewhere: Secondary | ICD-10-CM | POA: Diagnosis not present

## 2021-03-05 DIAGNOSIS — I21A1 Myocardial infarction type 2: Secondary | ICD-10-CM | POA: Diagnosis not present

## 2021-03-05 DIAGNOSIS — E2740 Unspecified adrenocortical insufficiency: Secondary | ICD-10-CM | POA: Diagnosis not present

## 2021-03-05 DIAGNOSIS — Z66 Do not resuscitate: Secondary | ICD-10-CM | POA: Diagnosis not present

## 2021-03-05 DIAGNOSIS — M316 Other giant cell arteritis: Secondary | ICD-10-CM | POA: Diagnosis not present

## 2021-03-06 DIAGNOSIS — I509 Heart failure, unspecified: Secondary | ICD-10-CM | POA: Diagnosis not present

## 2021-03-06 DIAGNOSIS — I2510 Atherosclerotic heart disease of native coronary artery without angina pectoris: Secondary | ICD-10-CM | POA: Diagnosis not present

## 2021-03-06 DIAGNOSIS — J9601 Acute respiratory failure with hypoxia: Secondary | ICD-10-CM | POA: Diagnosis not present

## 2021-03-06 DIAGNOSIS — Z87891 Personal history of nicotine dependence: Secondary | ICD-10-CM | POA: Diagnosis not present

## 2021-03-06 DIAGNOSIS — I214 Non-ST elevation (NSTEMI) myocardial infarction: Secondary | ICD-10-CM | POA: Diagnosis not present

## 2021-03-07 DIAGNOSIS — I080 Rheumatic disorders of both mitral and aortic valves: Secondary | ICD-10-CM | POA: Diagnosis not present

## 2021-03-08 DIAGNOSIS — I214 Non-ST elevation (NSTEMI) myocardial infarction: Secondary | ICD-10-CM | POA: Diagnosis not present

## 2021-03-09 DIAGNOSIS — I214 Non-ST elevation (NSTEMI) myocardial infarction: Secondary | ICD-10-CM | POA: Diagnosis not present

## 2021-03-10 DIAGNOSIS — I214 Non-ST elevation (NSTEMI) myocardial infarction: Secondary | ICD-10-CM | POA: Diagnosis not present

## 2021-03-11 DIAGNOSIS — I214 Non-ST elevation (NSTEMI) myocardial infarction: Secondary | ICD-10-CM | POA: Diagnosis not present

## 2021-03-19 DIAGNOSIS — E785 Hyperlipidemia, unspecified: Secondary | ICD-10-CM | POA: Diagnosis not present

## 2021-03-19 DIAGNOSIS — I444 Left anterior fascicular block: Secondary | ICD-10-CM | POA: Diagnosis not present

## 2021-03-19 DIAGNOSIS — I5021 Acute systolic (congestive) heart failure: Secondary | ICD-10-CM | POA: Diagnosis not present

## 2021-03-19 DIAGNOSIS — I130 Hypertensive heart and chronic kidney disease with heart failure and stage 1 through stage 4 chronic kidney disease, or unspecified chronic kidney disease: Secondary | ICD-10-CM | POA: Diagnosis not present

## 2021-03-19 DIAGNOSIS — I4510 Unspecified right bundle-branch block: Secondary | ICD-10-CM | POA: Diagnosis not present

## 2021-03-19 DIAGNOSIS — M316 Other giant cell arteritis: Secondary | ICD-10-CM | POA: Diagnosis not present

## 2021-03-19 DIAGNOSIS — I272 Pulmonary hypertension, unspecified: Secondary | ICD-10-CM | POA: Diagnosis not present

## 2021-03-19 DIAGNOSIS — I2510 Atherosclerotic heart disease of native coronary artery without angina pectoris: Secondary | ICD-10-CM | POA: Diagnosis not present

## 2021-03-19 DIAGNOSIS — J9601 Acute respiratory failure with hypoxia: Secondary | ICD-10-CM | POA: Diagnosis not present

## 2021-03-19 DIAGNOSIS — N183 Chronic kidney disease, stage 3 unspecified: Secondary | ICD-10-CM | POA: Diagnosis not present

## 2021-03-19 DIAGNOSIS — Z8719 Personal history of other diseases of the digestive system: Secondary | ICD-10-CM | POA: Diagnosis not present

## 2021-03-19 DIAGNOSIS — Z8711 Personal history of peptic ulcer disease: Secondary | ICD-10-CM | POA: Diagnosis not present

## 2021-03-19 DIAGNOSIS — D62 Acute posthemorrhagic anemia: Secondary | ICD-10-CM | POA: Diagnosis not present

## 2021-03-19 DIAGNOSIS — I214 Non-ST elevation (NSTEMI) myocardial infarction: Secondary | ICD-10-CM | POA: Diagnosis not present

## 2021-03-19 DIAGNOSIS — Z87891 Personal history of nicotine dependence: Secondary | ICD-10-CM | POA: Diagnosis not present

## 2021-03-20 DIAGNOSIS — I214 Non-ST elevation (NSTEMI) myocardial infarction: Secondary | ICD-10-CM | POA: Diagnosis not present

## 2021-03-20 DIAGNOSIS — I130 Hypertensive heart and chronic kidney disease with heart failure and stage 1 through stage 4 chronic kidney disease, or unspecified chronic kidney disease: Secondary | ICD-10-CM | POA: Diagnosis not present

## 2021-03-20 DIAGNOSIS — Z8719 Personal history of other diseases of the digestive system: Secondary | ICD-10-CM | POA: Diagnosis not present

## 2021-03-20 DIAGNOSIS — Z87891 Personal history of nicotine dependence: Secondary | ICD-10-CM | POA: Diagnosis not present

## 2021-03-20 DIAGNOSIS — I4510 Unspecified right bundle-branch block: Secondary | ICD-10-CM | POA: Diagnosis not present

## 2021-03-20 DIAGNOSIS — I5021 Acute systolic (congestive) heart failure: Secondary | ICD-10-CM | POA: Diagnosis not present

## 2021-03-20 DIAGNOSIS — I444 Left anterior fascicular block: Secondary | ICD-10-CM | POA: Diagnosis not present

## 2021-03-20 DIAGNOSIS — D62 Acute posthemorrhagic anemia: Secondary | ICD-10-CM | POA: Diagnosis not present

## 2021-03-20 DIAGNOSIS — I2510 Atherosclerotic heart disease of native coronary artery without angina pectoris: Secondary | ICD-10-CM | POA: Diagnosis not present

## 2021-03-20 DIAGNOSIS — I272 Pulmonary hypertension, unspecified: Secondary | ICD-10-CM | POA: Diagnosis not present

## 2021-03-20 DIAGNOSIS — Z8711 Personal history of peptic ulcer disease: Secondary | ICD-10-CM | POA: Diagnosis not present

## 2021-03-20 DIAGNOSIS — J9601 Acute respiratory failure with hypoxia: Secondary | ICD-10-CM | POA: Diagnosis not present

## 2021-03-20 DIAGNOSIS — N183 Chronic kidney disease, stage 3 unspecified: Secondary | ICD-10-CM | POA: Diagnosis not present

## 2021-03-20 DIAGNOSIS — E785 Hyperlipidemia, unspecified: Secondary | ICD-10-CM | POA: Diagnosis not present

## 2021-03-20 DIAGNOSIS — M316 Other giant cell arteritis: Secondary | ICD-10-CM | POA: Diagnosis not present

## 2021-03-21 DIAGNOSIS — D62 Acute posthemorrhagic anemia: Secondary | ICD-10-CM | POA: Diagnosis not present

## 2021-03-21 DIAGNOSIS — I4510 Unspecified right bundle-branch block: Secondary | ICD-10-CM | POA: Diagnosis not present

## 2021-03-21 DIAGNOSIS — N183 Chronic kidney disease, stage 3 unspecified: Secondary | ICD-10-CM | POA: Diagnosis not present

## 2021-03-21 DIAGNOSIS — I214 Non-ST elevation (NSTEMI) myocardial infarction: Secondary | ICD-10-CM | POA: Diagnosis not present

## 2021-03-21 DIAGNOSIS — Z8711 Personal history of peptic ulcer disease: Secondary | ICD-10-CM | POA: Diagnosis not present

## 2021-03-21 DIAGNOSIS — J9601 Acute respiratory failure with hypoxia: Secondary | ICD-10-CM | POA: Diagnosis not present

## 2021-03-21 DIAGNOSIS — M316 Other giant cell arteritis: Secondary | ICD-10-CM | POA: Diagnosis not present

## 2021-03-21 DIAGNOSIS — Z87891 Personal history of nicotine dependence: Secondary | ICD-10-CM | POA: Diagnosis not present

## 2021-03-21 DIAGNOSIS — I130 Hypertensive heart and chronic kidney disease with heart failure and stage 1 through stage 4 chronic kidney disease, or unspecified chronic kidney disease: Secondary | ICD-10-CM | POA: Diagnosis not present

## 2021-03-21 DIAGNOSIS — I444 Left anterior fascicular block: Secondary | ICD-10-CM | POA: Diagnosis not present

## 2021-03-21 DIAGNOSIS — I2510 Atherosclerotic heart disease of native coronary artery without angina pectoris: Secondary | ICD-10-CM | POA: Diagnosis not present

## 2021-03-21 DIAGNOSIS — E785 Hyperlipidemia, unspecified: Secondary | ICD-10-CM | POA: Diagnosis not present

## 2021-03-21 DIAGNOSIS — Z8719 Personal history of other diseases of the digestive system: Secondary | ICD-10-CM | POA: Diagnosis not present

## 2021-03-21 DIAGNOSIS — I5021 Acute systolic (congestive) heart failure: Secondary | ICD-10-CM | POA: Diagnosis not present

## 2021-03-21 DIAGNOSIS — I272 Pulmonary hypertension, unspecified: Secondary | ICD-10-CM | POA: Diagnosis not present

## 2021-03-25 DIAGNOSIS — Z79899 Other long term (current) drug therapy: Secondary | ICD-10-CM | POA: Diagnosis not present

## 2021-03-25 DIAGNOSIS — D62 Acute posthemorrhagic anemia: Secondary | ICD-10-CM | POA: Diagnosis not present

## 2021-03-25 DIAGNOSIS — I2510 Atherosclerotic heart disease of native coronary artery without angina pectoris: Secondary | ICD-10-CM | POA: Diagnosis not present

## 2021-03-25 DIAGNOSIS — Z7689 Persons encountering health services in other specified circumstances: Secondary | ICD-10-CM | POA: Diagnosis not present

## 2021-03-25 DIAGNOSIS — Z8619 Personal history of other infectious and parasitic diseases: Secondary | ICD-10-CM | POA: Diagnosis not present

## 2021-03-25 DIAGNOSIS — R5381 Other malaise: Secondary | ICD-10-CM | POA: Diagnosis not present

## 2021-03-25 DIAGNOSIS — N1832 Chronic kidney disease, stage 3b: Secondary | ICD-10-CM | POA: Diagnosis not present

## 2021-03-25 DIAGNOSIS — I5030 Unspecified diastolic (congestive) heart failure: Secondary | ICD-10-CM | POA: Diagnosis not present

## 2021-03-25 DIAGNOSIS — I498 Other specified cardiac arrhythmias: Secondary | ICD-10-CM | POA: Diagnosis not present

## 2021-03-26 DIAGNOSIS — Z8711 Personal history of peptic ulcer disease: Secondary | ICD-10-CM | POA: Diagnosis not present

## 2021-03-26 DIAGNOSIS — Z8719 Personal history of other diseases of the digestive system: Secondary | ICD-10-CM | POA: Diagnosis not present

## 2021-03-26 DIAGNOSIS — I4510 Unspecified right bundle-branch block: Secondary | ICD-10-CM | POA: Diagnosis not present

## 2021-03-26 DIAGNOSIS — M316 Other giant cell arteritis: Secondary | ICD-10-CM | POA: Diagnosis not present

## 2021-03-26 DIAGNOSIS — I5021 Acute systolic (congestive) heart failure: Secondary | ICD-10-CM | POA: Diagnosis not present

## 2021-03-26 DIAGNOSIS — I2510 Atherosclerotic heart disease of native coronary artery without angina pectoris: Secondary | ICD-10-CM | POA: Diagnosis not present

## 2021-03-26 DIAGNOSIS — E785 Hyperlipidemia, unspecified: Secondary | ICD-10-CM | POA: Diagnosis not present

## 2021-03-26 DIAGNOSIS — Z87891 Personal history of nicotine dependence: Secondary | ICD-10-CM | POA: Diagnosis not present

## 2021-03-26 DIAGNOSIS — D62 Acute posthemorrhagic anemia: Secondary | ICD-10-CM | POA: Diagnosis not present

## 2021-03-26 DIAGNOSIS — J9601 Acute respiratory failure with hypoxia: Secondary | ICD-10-CM | POA: Diagnosis not present

## 2021-03-26 DIAGNOSIS — I272 Pulmonary hypertension, unspecified: Secondary | ICD-10-CM | POA: Diagnosis not present

## 2021-03-26 DIAGNOSIS — N183 Chronic kidney disease, stage 3 unspecified: Secondary | ICD-10-CM | POA: Diagnosis not present

## 2021-03-26 DIAGNOSIS — I214 Non-ST elevation (NSTEMI) myocardial infarction: Secondary | ICD-10-CM | POA: Diagnosis not present

## 2021-03-26 DIAGNOSIS — I130 Hypertensive heart and chronic kidney disease with heart failure and stage 1 through stage 4 chronic kidney disease, or unspecified chronic kidney disease: Secondary | ICD-10-CM | POA: Diagnosis not present

## 2021-03-26 DIAGNOSIS — I444 Left anterior fascicular block: Secondary | ICD-10-CM | POA: Diagnosis not present

## 2021-03-27 DIAGNOSIS — N1832 Chronic kidney disease, stage 3b: Secondary | ICD-10-CM | POA: Diagnosis not present

## 2021-03-27 DIAGNOSIS — M316 Other giant cell arteritis: Secondary | ICD-10-CM | POA: Diagnosis not present

## 2021-03-27 DIAGNOSIS — R600 Localized edema: Secondary | ICD-10-CM | POA: Diagnosis not present

## 2021-03-27 DIAGNOSIS — I10 Essential (primary) hypertension: Secondary | ICD-10-CM | POA: Diagnosis not present

## 2021-03-27 DIAGNOSIS — Z79899 Other long term (current) drug therapy: Secondary | ICD-10-CM | POA: Diagnosis not present

## 2021-03-28 DIAGNOSIS — I272 Pulmonary hypertension, unspecified: Secondary | ICD-10-CM | POA: Diagnosis not present

## 2021-03-28 DIAGNOSIS — D62 Acute posthemorrhagic anemia: Secondary | ICD-10-CM | POA: Diagnosis not present

## 2021-03-28 DIAGNOSIS — E785 Hyperlipidemia, unspecified: Secondary | ICD-10-CM | POA: Diagnosis not present

## 2021-03-28 DIAGNOSIS — I4510 Unspecified right bundle-branch block: Secondary | ICD-10-CM | POA: Diagnosis not present

## 2021-03-28 DIAGNOSIS — I214 Non-ST elevation (NSTEMI) myocardial infarction: Secondary | ICD-10-CM | POA: Diagnosis not present

## 2021-03-28 DIAGNOSIS — I444 Left anterior fascicular block: Secondary | ICD-10-CM | POA: Diagnosis not present

## 2021-03-28 DIAGNOSIS — Z87891 Personal history of nicotine dependence: Secondary | ICD-10-CM | POA: Diagnosis not present

## 2021-03-28 DIAGNOSIS — N183 Chronic kidney disease, stage 3 unspecified: Secondary | ICD-10-CM | POA: Diagnosis not present

## 2021-03-28 DIAGNOSIS — I5021 Acute systolic (congestive) heart failure: Secondary | ICD-10-CM | POA: Diagnosis not present

## 2021-03-28 DIAGNOSIS — Z8719 Personal history of other diseases of the digestive system: Secondary | ICD-10-CM | POA: Diagnosis not present

## 2021-03-28 DIAGNOSIS — Z8711 Personal history of peptic ulcer disease: Secondary | ICD-10-CM | POA: Diagnosis not present

## 2021-03-28 DIAGNOSIS — J9601 Acute respiratory failure with hypoxia: Secondary | ICD-10-CM | POA: Diagnosis not present

## 2021-03-28 DIAGNOSIS — I2510 Atherosclerotic heart disease of native coronary artery without angina pectoris: Secondary | ICD-10-CM | POA: Diagnosis not present

## 2021-03-28 DIAGNOSIS — I130 Hypertensive heart and chronic kidney disease with heart failure and stage 1 through stage 4 chronic kidney disease, or unspecified chronic kidney disease: Secondary | ICD-10-CM | POA: Diagnosis not present

## 2021-03-28 DIAGNOSIS — M316 Other giant cell arteritis: Secondary | ICD-10-CM | POA: Diagnosis not present

## 2021-03-31 DIAGNOSIS — I214 Non-ST elevation (NSTEMI) myocardial infarction: Secondary | ICD-10-CM | POA: Diagnosis not present

## 2021-03-31 DIAGNOSIS — I2510 Atherosclerotic heart disease of native coronary artery without angina pectoris: Secondary | ICD-10-CM | POA: Diagnosis not present

## 2021-03-31 DIAGNOSIS — Z8711 Personal history of peptic ulcer disease: Secondary | ICD-10-CM | POA: Diagnosis not present

## 2021-03-31 DIAGNOSIS — E785 Hyperlipidemia, unspecified: Secondary | ICD-10-CM | POA: Diagnosis not present

## 2021-03-31 DIAGNOSIS — Z87891 Personal history of nicotine dependence: Secondary | ICD-10-CM | POA: Diagnosis not present

## 2021-03-31 DIAGNOSIS — I4510 Unspecified right bundle-branch block: Secondary | ICD-10-CM | POA: Diagnosis not present

## 2021-03-31 DIAGNOSIS — J9601 Acute respiratory failure with hypoxia: Secondary | ICD-10-CM | POA: Diagnosis not present

## 2021-03-31 DIAGNOSIS — I272 Pulmonary hypertension, unspecified: Secondary | ICD-10-CM | POA: Diagnosis not present

## 2021-03-31 DIAGNOSIS — D62 Acute posthemorrhagic anemia: Secondary | ICD-10-CM | POA: Diagnosis not present

## 2021-03-31 DIAGNOSIS — I130 Hypertensive heart and chronic kidney disease with heart failure and stage 1 through stage 4 chronic kidney disease, or unspecified chronic kidney disease: Secondary | ICD-10-CM | POA: Diagnosis not present

## 2021-03-31 DIAGNOSIS — M316 Other giant cell arteritis: Secondary | ICD-10-CM | POA: Diagnosis not present

## 2021-03-31 DIAGNOSIS — Z8719 Personal history of other diseases of the digestive system: Secondary | ICD-10-CM | POA: Diagnosis not present

## 2021-03-31 DIAGNOSIS — N183 Chronic kidney disease, stage 3 unspecified: Secondary | ICD-10-CM | POA: Diagnosis not present

## 2021-03-31 DIAGNOSIS — I444 Left anterior fascicular block: Secondary | ICD-10-CM | POA: Diagnosis not present

## 2021-03-31 DIAGNOSIS — I5021 Acute systolic (congestive) heart failure: Secondary | ICD-10-CM | POA: Diagnosis not present

## 2021-04-03 DIAGNOSIS — I4510 Unspecified right bundle-branch block: Secondary | ICD-10-CM | POA: Diagnosis not present

## 2021-04-03 DIAGNOSIS — I272 Pulmonary hypertension, unspecified: Secondary | ICD-10-CM | POA: Diagnosis not present

## 2021-04-03 DIAGNOSIS — I5021 Acute systolic (congestive) heart failure: Secondary | ICD-10-CM | POA: Diagnosis not present

## 2021-04-03 DIAGNOSIS — D62 Acute posthemorrhagic anemia: Secondary | ICD-10-CM | POA: Diagnosis not present

## 2021-04-03 DIAGNOSIS — Z8719 Personal history of other diseases of the digestive system: Secondary | ICD-10-CM | POA: Diagnosis not present

## 2021-04-03 DIAGNOSIS — M316 Other giant cell arteritis: Secondary | ICD-10-CM | POA: Diagnosis not present

## 2021-04-03 DIAGNOSIS — Z87891 Personal history of nicotine dependence: Secondary | ICD-10-CM | POA: Diagnosis not present

## 2021-04-03 DIAGNOSIS — I2510 Atherosclerotic heart disease of native coronary artery without angina pectoris: Secondary | ICD-10-CM | POA: Diagnosis not present

## 2021-04-03 DIAGNOSIS — I214 Non-ST elevation (NSTEMI) myocardial infarction: Secondary | ICD-10-CM | POA: Diagnosis not present

## 2021-04-03 DIAGNOSIS — E785 Hyperlipidemia, unspecified: Secondary | ICD-10-CM | POA: Diagnosis not present

## 2021-04-03 DIAGNOSIS — I444 Left anterior fascicular block: Secondary | ICD-10-CM | POA: Diagnosis not present

## 2021-04-03 DIAGNOSIS — J9601 Acute respiratory failure with hypoxia: Secondary | ICD-10-CM | POA: Diagnosis not present

## 2021-04-03 DIAGNOSIS — Z8711 Personal history of peptic ulcer disease: Secondary | ICD-10-CM | POA: Diagnosis not present

## 2021-04-03 DIAGNOSIS — N183 Chronic kidney disease, stage 3 unspecified: Secondary | ICD-10-CM | POA: Diagnosis not present

## 2021-04-03 DIAGNOSIS — I130 Hypertensive heart and chronic kidney disease with heart failure and stage 1 through stage 4 chronic kidney disease, or unspecified chronic kidney disease: Secondary | ICD-10-CM | POA: Diagnosis not present

## 2021-04-08 DIAGNOSIS — Z8711 Personal history of peptic ulcer disease: Secondary | ICD-10-CM | POA: Diagnosis not present

## 2021-04-08 DIAGNOSIS — Z87891 Personal history of nicotine dependence: Secondary | ICD-10-CM | POA: Diagnosis not present

## 2021-04-08 DIAGNOSIS — M316 Other giant cell arteritis: Secondary | ICD-10-CM | POA: Diagnosis not present

## 2021-04-08 DIAGNOSIS — E785 Hyperlipidemia, unspecified: Secondary | ICD-10-CM | POA: Diagnosis not present

## 2021-04-08 DIAGNOSIS — I5021 Acute systolic (congestive) heart failure: Secondary | ICD-10-CM | POA: Diagnosis not present

## 2021-04-08 DIAGNOSIS — N183 Chronic kidney disease, stage 3 unspecified: Secondary | ICD-10-CM | POA: Diagnosis not present

## 2021-04-08 DIAGNOSIS — J9601 Acute respiratory failure with hypoxia: Secondary | ICD-10-CM | POA: Diagnosis not present

## 2021-04-08 DIAGNOSIS — I130 Hypertensive heart and chronic kidney disease with heart failure and stage 1 through stage 4 chronic kidney disease, or unspecified chronic kidney disease: Secondary | ICD-10-CM | POA: Diagnosis not present

## 2021-04-08 DIAGNOSIS — Z8719 Personal history of other diseases of the digestive system: Secondary | ICD-10-CM | POA: Diagnosis not present

## 2021-04-08 DIAGNOSIS — I4510 Unspecified right bundle-branch block: Secondary | ICD-10-CM | POA: Diagnosis not present

## 2021-04-08 DIAGNOSIS — I272 Pulmonary hypertension, unspecified: Secondary | ICD-10-CM | POA: Diagnosis not present

## 2021-04-08 DIAGNOSIS — I214 Non-ST elevation (NSTEMI) myocardial infarction: Secondary | ICD-10-CM | POA: Diagnosis not present

## 2021-04-08 DIAGNOSIS — I444 Left anterior fascicular block: Secondary | ICD-10-CM | POA: Diagnosis not present

## 2021-04-08 DIAGNOSIS — D62 Acute posthemorrhagic anemia: Secondary | ICD-10-CM | POA: Diagnosis not present

## 2021-04-08 DIAGNOSIS — I2510 Atherosclerotic heart disease of native coronary artery without angina pectoris: Secondary | ICD-10-CM | POA: Diagnosis not present

## 2021-04-10 DIAGNOSIS — I130 Hypertensive heart and chronic kidney disease with heart failure and stage 1 through stage 4 chronic kidney disease, or unspecified chronic kidney disease: Secondary | ICD-10-CM | POA: Diagnosis not present

## 2021-04-10 DIAGNOSIS — I444 Left anterior fascicular block: Secondary | ICD-10-CM | POA: Diagnosis not present

## 2021-04-10 DIAGNOSIS — N183 Chronic kidney disease, stage 3 unspecified: Secondary | ICD-10-CM | POA: Diagnosis not present

## 2021-04-10 DIAGNOSIS — Z87891 Personal history of nicotine dependence: Secondary | ICD-10-CM | POA: Diagnosis not present

## 2021-04-10 DIAGNOSIS — M316 Other giant cell arteritis: Secondary | ICD-10-CM | POA: Diagnosis not present

## 2021-04-10 DIAGNOSIS — I5021 Acute systolic (congestive) heart failure: Secondary | ICD-10-CM | POA: Diagnosis not present

## 2021-04-10 DIAGNOSIS — E785 Hyperlipidemia, unspecified: Secondary | ICD-10-CM | POA: Diagnosis not present

## 2021-04-10 DIAGNOSIS — J9601 Acute respiratory failure with hypoxia: Secondary | ICD-10-CM | POA: Diagnosis not present

## 2021-04-10 DIAGNOSIS — Z8711 Personal history of peptic ulcer disease: Secondary | ICD-10-CM | POA: Diagnosis not present

## 2021-04-10 DIAGNOSIS — D62 Acute posthemorrhagic anemia: Secondary | ICD-10-CM | POA: Diagnosis not present

## 2021-04-10 DIAGNOSIS — I272 Pulmonary hypertension, unspecified: Secondary | ICD-10-CM | POA: Diagnosis not present

## 2021-04-10 DIAGNOSIS — Z8719 Personal history of other diseases of the digestive system: Secondary | ICD-10-CM | POA: Diagnosis not present

## 2021-04-10 DIAGNOSIS — I4510 Unspecified right bundle-branch block: Secondary | ICD-10-CM | POA: Diagnosis not present

## 2021-04-10 DIAGNOSIS — I2510 Atherosclerotic heart disease of native coronary artery without angina pectoris: Secondary | ICD-10-CM | POA: Diagnosis not present

## 2021-04-10 DIAGNOSIS — I214 Non-ST elevation (NSTEMI) myocardial infarction: Secondary | ICD-10-CM | POA: Diagnosis not present

## 2021-04-16 DIAGNOSIS — I350 Nonrheumatic aortic (valve) stenosis: Secondary | ICD-10-CM | POA: Diagnosis not present

## 2021-04-16 DIAGNOSIS — Z23 Encounter for immunization: Secondary | ICD-10-CM | POA: Diagnosis not present

## 2021-04-16 DIAGNOSIS — R609 Edema, unspecified: Secondary | ICD-10-CM | POA: Diagnosis not present

## 2021-04-16 DIAGNOSIS — R0602 Shortness of breath: Secondary | ICD-10-CM | POA: Diagnosis not present

## 2021-04-16 DIAGNOSIS — I214 Non-ST elevation (NSTEMI) myocardial infarction: Secondary | ICD-10-CM | POA: Diagnosis not present

## 2021-04-16 DIAGNOSIS — K922 Gastrointestinal hemorrhage, unspecified: Secondary | ICD-10-CM | POA: Diagnosis not present

## 2021-04-16 DIAGNOSIS — I5021 Acute systolic (congestive) heart failure: Secondary | ICD-10-CM | POA: Diagnosis not present

## 2021-04-17 DIAGNOSIS — E785 Hyperlipidemia, unspecified: Secondary | ICD-10-CM | POA: Diagnosis not present

## 2021-04-17 DIAGNOSIS — Z87891 Personal history of nicotine dependence: Secondary | ICD-10-CM | POA: Diagnosis not present

## 2021-04-17 DIAGNOSIS — I4510 Unspecified right bundle-branch block: Secondary | ICD-10-CM | POA: Diagnosis not present

## 2021-04-17 DIAGNOSIS — N183 Chronic kidney disease, stage 3 unspecified: Secondary | ICD-10-CM | POA: Diagnosis not present

## 2021-04-17 DIAGNOSIS — M316 Other giant cell arteritis: Secondary | ICD-10-CM | POA: Diagnosis not present

## 2021-04-17 DIAGNOSIS — Z8719 Personal history of other diseases of the digestive system: Secondary | ICD-10-CM | POA: Diagnosis not present

## 2021-04-17 DIAGNOSIS — I444 Left anterior fascicular block: Secondary | ICD-10-CM | POA: Diagnosis not present

## 2021-04-17 DIAGNOSIS — Z8711 Personal history of peptic ulcer disease: Secondary | ICD-10-CM | POA: Diagnosis not present

## 2021-04-17 DIAGNOSIS — I2510 Atherosclerotic heart disease of native coronary artery without angina pectoris: Secondary | ICD-10-CM | POA: Diagnosis not present

## 2021-04-17 DIAGNOSIS — D62 Acute posthemorrhagic anemia: Secondary | ICD-10-CM | POA: Diagnosis not present

## 2021-04-17 DIAGNOSIS — I272 Pulmonary hypertension, unspecified: Secondary | ICD-10-CM | POA: Diagnosis not present

## 2021-04-17 DIAGNOSIS — J9601 Acute respiratory failure with hypoxia: Secondary | ICD-10-CM | POA: Diagnosis not present

## 2021-04-17 DIAGNOSIS — I214 Non-ST elevation (NSTEMI) myocardial infarction: Secondary | ICD-10-CM | POA: Diagnosis not present

## 2021-04-17 DIAGNOSIS — I5021 Acute systolic (congestive) heart failure: Secondary | ICD-10-CM | POA: Diagnosis not present

## 2021-04-17 DIAGNOSIS — I130 Hypertensive heart and chronic kidney disease with heart failure and stage 1 through stage 4 chronic kidney disease, or unspecified chronic kidney disease: Secondary | ICD-10-CM | POA: Diagnosis not present

## 2021-04-18 DIAGNOSIS — I4891 Unspecified atrial fibrillation: Secondary | ICD-10-CM | POA: Insufficient documentation

## 2021-04-23 DIAGNOSIS — I252 Old myocardial infarction: Secondary | ICD-10-CM | POA: Diagnosis not present

## 2021-04-23 DIAGNOSIS — M316 Other giant cell arteritis: Secondary | ICD-10-CM | POA: Diagnosis not present

## 2021-05-06 DIAGNOSIS — M316 Other giant cell arteritis: Secondary | ICD-10-CM | POA: Diagnosis not present

## 2021-05-06 DIAGNOSIS — N1832 Chronic kidney disease, stage 3b: Secondary | ICD-10-CM | POA: Diagnosis not present

## 2021-05-06 DIAGNOSIS — R600 Localized edema: Secondary | ICD-10-CM | POA: Diagnosis not present

## 2021-05-06 DIAGNOSIS — I10 Essential (primary) hypertension: Secondary | ICD-10-CM | POA: Diagnosis not present

## 2021-05-21 DIAGNOSIS — I6522 Occlusion and stenosis of left carotid artery: Secondary | ICD-10-CM | POA: Diagnosis not present

## 2021-05-21 DIAGNOSIS — M316 Other giant cell arteritis: Secondary | ICD-10-CM | POA: Diagnosis not present

## 2021-05-21 DIAGNOSIS — M542 Cervicalgia: Secondary | ICD-10-CM | POA: Diagnosis not present

## 2021-05-21 DIAGNOSIS — M4802 Spinal stenosis, cervical region: Secondary | ICD-10-CM | POA: Diagnosis not present

## 2021-05-21 DIAGNOSIS — G8929 Other chronic pain: Secondary | ICD-10-CM | POA: Diagnosis not present

## 2021-05-21 DIAGNOSIS — Z7952 Long term (current) use of systemic steroids: Secondary | ICD-10-CM | POA: Diagnosis not present

## 2021-05-27 DIAGNOSIS — I4892 Unspecified atrial flutter: Secondary | ICD-10-CM | POA: Diagnosis not present

## 2021-05-27 DIAGNOSIS — N1832 Chronic kidney disease, stage 3b: Secondary | ICD-10-CM | POA: Diagnosis not present

## 2021-05-27 DIAGNOSIS — I350 Nonrheumatic aortic (valve) stenosis: Secondary | ICD-10-CM | POA: Diagnosis not present

## 2021-05-27 DIAGNOSIS — D649 Anemia, unspecified: Secondary | ICD-10-CM | POA: Diagnosis not present

## 2021-05-27 DIAGNOSIS — I4891 Unspecified atrial fibrillation: Secondary | ICD-10-CM | POA: Diagnosis not present

## 2021-05-27 DIAGNOSIS — I483 Typical atrial flutter: Secondary | ICD-10-CM | POA: Diagnosis not present

## 2021-05-27 DIAGNOSIS — I480 Paroxysmal atrial fibrillation: Secondary | ICD-10-CM | POA: Diagnosis not present

## 2021-05-27 DIAGNOSIS — K922 Gastrointestinal hemorrhage, unspecified: Secondary | ICD-10-CM | POA: Diagnosis not present

## 2021-06-16 DIAGNOSIS — R001 Bradycardia, unspecified: Secondary | ICD-10-CM | POA: Diagnosis not present

## 2021-06-19 DIAGNOSIS — I4891 Unspecified atrial fibrillation: Secondary | ICD-10-CM | POA: Diagnosis not present

## 2021-06-19 DIAGNOSIS — I4892 Unspecified atrial flutter: Secondary | ICD-10-CM | POA: Diagnosis not present

## 2021-06-19 DIAGNOSIS — I350 Nonrheumatic aortic (valve) stenosis: Secondary | ICD-10-CM | POA: Diagnosis not present

## 2021-06-19 DIAGNOSIS — N1832 Chronic kidney disease, stage 3b: Secondary | ICD-10-CM | POA: Diagnosis not present

## 2021-06-19 DIAGNOSIS — R609 Edema, unspecified: Secondary | ICD-10-CM | POA: Diagnosis not present

## 2021-07-23 DIAGNOSIS — Z8719 Personal history of other diseases of the digestive system: Secondary | ICD-10-CM | POA: Diagnosis not present

## 2021-07-23 DIAGNOSIS — D649 Anemia, unspecified: Secondary | ICD-10-CM | POA: Diagnosis not present

## 2021-07-29 DIAGNOSIS — M810 Age-related osteoporosis without current pathological fracture: Secondary | ICD-10-CM | POA: Diagnosis not present

## 2021-07-29 DIAGNOSIS — R600 Localized edema: Secondary | ICD-10-CM | POA: Diagnosis not present

## 2021-07-29 DIAGNOSIS — M7062 Trochanteric bursitis, left hip: Secondary | ICD-10-CM | POA: Diagnosis not present

## 2021-07-29 DIAGNOSIS — E782 Mixed hyperlipidemia: Secondary | ICD-10-CM | POA: Diagnosis not present

## 2021-07-29 DIAGNOSIS — R202 Paresthesia of skin: Secondary | ICD-10-CM | POA: Diagnosis not present

## 2021-07-29 DIAGNOSIS — D649 Anemia, unspecified: Secondary | ICD-10-CM | POA: Diagnosis not present

## 2021-07-29 DIAGNOSIS — M316 Other giant cell arteritis: Secondary | ICD-10-CM | POA: Diagnosis not present

## 2021-07-29 DIAGNOSIS — N1832 Chronic kidney disease, stage 3b: Secondary | ICD-10-CM | POA: Diagnosis not present

## 2021-08-06 DIAGNOSIS — M316 Other giant cell arteritis: Secondary | ICD-10-CM | POA: Diagnosis not present

## 2021-08-06 DIAGNOSIS — M5416 Radiculopathy, lumbar region: Secondary | ICD-10-CM | POA: Diagnosis not present

## 2021-08-06 DIAGNOSIS — Z7952 Long term (current) use of systemic steroids: Secondary | ICD-10-CM | POA: Diagnosis not present

## 2021-10-28 DIAGNOSIS — I4891 Unspecified atrial fibrillation: Secondary | ICD-10-CM | POA: Diagnosis not present

## 2021-10-28 DIAGNOSIS — I350 Nonrheumatic aortic (valve) stenosis: Secondary | ICD-10-CM | POA: Diagnosis not present

## 2021-10-28 DIAGNOSIS — R609 Edema, unspecified: Secondary | ICD-10-CM | POA: Diagnosis not present

## 2021-10-28 DIAGNOSIS — R001 Bradycardia, unspecified: Secondary | ICD-10-CM | POA: Diagnosis not present

## 2021-10-28 DIAGNOSIS — R0602 Shortness of breath: Secondary | ICD-10-CM | POA: Diagnosis not present

## 2021-10-28 DIAGNOSIS — I4892 Unspecified atrial flutter: Secondary | ICD-10-CM | POA: Diagnosis not present

## 2021-11-16 ENCOUNTER — Other Ambulatory Visit: Payer: Self-pay

## 2021-11-16 ENCOUNTER — Emergency Department: Payer: Medicare Other | Admitting: Certified Registered"

## 2021-11-16 ENCOUNTER — Encounter
Admission: EM | Disposition: A | Discharge status: Home Health | Payer: Self-pay | Source: Home / Self Care | Attending: Vascular Surgery

## 2021-11-16 ENCOUNTER — Emergency Department: Payer: Medicare Other

## 2021-11-16 ENCOUNTER — Inpatient Hospital Stay
Admission: EM | Admit: 2021-11-16 | Discharge: 2021-11-25 | DRG: 271 | Disposition: A | Discharge status: Home Health | Payer: Medicare Other | Attending: Vascular Surgery | Admitting: Vascular Surgery

## 2021-11-16 DIAGNOSIS — I129 Hypertensive chronic kidney disease with stage 1 through stage 4 chronic kidney disease, or unspecified chronic kidney disease: Secondary | ICD-10-CM | POA: Diagnosis not present

## 2021-11-16 DIAGNOSIS — M316 Other giant cell arteritis: Secondary | ICD-10-CM | POA: Diagnosis not present

## 2021-11-16 DIAGNOSIS — Z79899 Other long term (current) drug therapy: Secondary | ICD-10-CM | POA: Diagnosis not present

## 2021-11-16 DIAGNOSIS — N189 Chronic kidney disease, unspecified: Secondary | ICD-10-CM | POA: Diagnosis not present

## 2021-11-16 DIAGNOSIS — Z885 Allergy status to narcotic agent status: Secondary | ICD-10-CM

## 2021-11-16 DIAGNOSIS — I70202 Unspecified atherosclerosis of native arteries of extremities, left leg: Secondary | ICD-10-CM | POA: Diagnosis not present

## 2021-11-16 DIAGNOSIS — I350 Nonrheumatic aortic (valve) stenosis: Secondary | ICD-10-CM | POA: Diagnosis not present

## 2021-11-16 DIAGNOSIS — M62269 Nontraumatic ischemic infarction of muscle, unspecified lower leg: Secondary | ICD-10-CM | POA: Diagnosis not present

## 2021-11-16 DIAGNOSIS — I998 Other disorder of circulatory system: Secondary | ICD-10-CM | POA: Diagnosis not present

## 2021-11-16 DIAGNOSIS — F32A Depression, unspecified: Secondary | ICD-10-CM | POA: Diagnosis present

## 2021-11-16 DIAGNOSIS — Z7952 Long term (current) use of systemic steroids: Secondary | ICD-10-CM

## 2021-11-16 DIAGNOSIS — N1832 Chronic kidney disease, stage 3b: Secondary | ICD-10-CM | POA: Diagnosis present

## 2021-11-16 DIAGNOSIS — Z87891 Personal history of nicotine dependence: Secondary | ICD-10-CM

## 2021-11-16 DIAGNOSIS — I70222 Atherosclerosis of native arteries of extremities with rest pain, left leg: Secondary | ICD-10-CM | POA: Diagnosis not present

## 2021-11-16 DIAGNOSIS — R030 Elevated blood-pressure reading, without diagnosis of hypertension: Secondary | ICD-10-CM | POA: Diagnosis present

## 2021-11-16 DIAGNOSIS — S31114A Laceration without foreign body of abdominal wall, left lower quadrant without penetration into peritoneal cavity, initial encounter: Secondary | ICD-10-CM | POA: Diagnosis not present

## 2021-11-16 DIAGNOSIS — E669 Obesity, unspecified: Secondary | ICD-10-CM | POA: Diagnosis present

## 2021-11-16 DIAGNOSIS — I4891 Unspecified atrial fibrillation: Secondary | ICD-10-CM | POA: Diagnosis not present

## 2021-11-16 DIAGNOSIS — R410 Disorientation, unspecified: Secondary | ICD-10-CM | POA: Diagnosis not present

## 2021-11-16 DIAGNOSIS — E785 Hyperlipidemia, unspecified: Secondary | ICD-10-CM | POA: Diagnosis not present

## 2021-11-16 DIAGNOSIS — Z6841 Body Mass Index (BMI) 40.0 and over, adult: Secondary | ICD-10-CM

## 2021-11-16 DIAGNOSIS — I2510 Atherosclerotic heart disease of native coronary artery without angina pectoris: Secondary | ICD-10-CM | POA: Diagnosis present

## 2021-11-16 DIAGNOSIS — D649 Anemia, unspecified: Secondary | ICD-10-CM | POA: Diagnosis not present

## 2021-11-16 DIAGNOSIS — N390 Urinary tract infection, site not specified: Secondary | ICD-10-CM | POA: Diagnosis not present

## 2021-11-16 DIAGNOSIS — I743 Embolism and thrombosis of arteries of the lower extremities: Secondary | ICD-10-CM | POA: Diagnosis not present

## 2021-11-16 DIAGNOSIS — Z20822 Contact with and (suspected) exposure to covid-19: Secondary | ICD-10-CM | POA: Diagnosis not present

## 2021-11-16 DIAGNOSIS — Z7901 Long term (current) use of anticoagulants: Secondary | ICD-10-CM | POA: Diagnosis not present

## 2021-11-16 DIAGNOSIS — Z9981 Dependence on supplemental oxygen: Secondary | ICD-10-CM

## 2021-11-16 DIAGNOSIS — K219 Gastro-esophageal reflux disease without esophagitis: Secondary | ICD-10-CM | POA: Diagnosis present

## 2021-11-16 DIAGNOSIS — Z8249 Family history of ischemic heart disease and other diseases of the circulatory system: Secondary | ICD-10-CM

## 2021-11-16 DIAGNOSIS — J90 Pleural effusion, not elsewhere classified: Secondary | ICD-10-CM | POA: Diagnosis not present

## 2021-11-16 DIAGNOSIS — Z743 Need for continuous supervision: Secondary | ICD-10-CM | POA: Diagnosis not present

## 2021-11-16 DIAGNOSIS — Z888 Allergy status to other drugs, medicaments and biological substances status: Secondary | ICD-10-CM | POA: Diagnosis not present

## 2021-11-16 DIAGNOSIS — X58XXXA Exposure to other specified factors, initial encounter: Secondary | ICD-10-CM | POA: Diagnosis present

## 2021-11-16 DIAGNOSIS — K8689 Other specified diseases of pancreas: Secondary | ICD-10-CM | POA: Diagnosis not present

## 2021-11-16 DIAGNOSIS — R0902 Hypoxemia: Secondary | ICD-10-CM | POA: Diagnosis not present

## 2021-11-16 DIAGNOSIS — J449 Chronic obstructive pulmonary disease, unspecified: Secondary | ICD-10-CM | POA: Diagnosis not present

## 2021-11-16 DIAGNOSIS — I7419 Embolism and thrombosis of other parts of aorta: Secondary | ICD-10-CM | POA: Diagnosis not present

## 2021-11-16 DIAGNOSIS — M810 Age-related osteoporosis without current pathological fracture: Secondary | ICD-10-CM | POA: Diagnosis not present

## 2021-11-16 DIAGNOSIS — I70213 Atherosclerosis of native arteries of extremities with intermittent claudication, bilateral legs: Secondary | ICD-10-CM | POA: Diagnosis not present

## 2021-11-16 DIAGNOSIS — Z86718 Personal history of other venous thrombosis and embolism: Secondary | ICD-10-CM

## 2021-11-16 DIAGNOSIS — M25552 Pain in left hip: Secondary | ICD-10-CM | POA: Diagnosis present

## 2021-11-16 DIAGNOSIS — Z880 Allergy status to penicillin: Secondary | ICD-10-CM | POA: Diagnosis not present

## 2021-11-16 DIAGNOSIS — I10 Essential (primary) hypertension: Secondary | ICD-10-CM | POA: Diagnosis not present

## 2021-11-16 HISTORY — PX: THROMBECTOMY FEMORAL ARTERY: SHX6406

## 2021-11-16 LAB — COMPREHENSIVE METABOLIC PANEL
ALT: 11 U/L (ref 0–44)
AST: 18 U/L (ref 15–41)
Albumin: 3.7 g/dL (ref 3.5–5.0)
Alkaline Phosphatase: 47 U/L (ref 38–126)
Anion gap: 8 (ref 5–15)
BUN: 35 mg/dL — ABNORMAL HIGH (ref 8–23)
CO2: 29 mmol/L (ref 22–32)
Calcium: 9.4 mg/dL (ref 8.9–10.3)
Chloride: 106 mmol/L (ref 98–111)
Creatinine, Ser: 1.68 mg/dL — ABNORMAL HIGH (ref 0.44–1.00)
GFR, Estimated: 30 mL/min — ABNORMAL LOW (ref >60)
Glucose, Bld: 122 mg/dL — ABNORMAL HIGH (ref 70–99)
Potassium: 3.6 mmol/L (ref 3.5–5.1)
Sodium: 143 mmol/L (ref 135–145)
Total Bilirubin: 0.6 mg/dL (ref 0.3–1.2)
Total Protein: 7.2 g/dL (ref 6.5–8.1)

## 2021-11-16 LAB — CBC WITH DIFFERENTIAL/PLATELET
Abs Immature Granulocytes: 0.01 10*3/uL (ref 0.00–0.07)
Abs Immature Granulocytes: 0.03 10*3/uL (ref 0.00–0.07)
Basophils Absolute: 0 10*3/uL (ref 0.0–0.1)
Basophils Absolute: 0 10*3/uL (ref 0.0–0.1)
Basophils Relative: 1 %
Basophils Relative: 0 %
Eosinophils Absolute: 0.1 10*3/uL (ref 0.0–0.5)
Eosinophils Absolute: 0.1 10*3/uL (ref 0.0–0.5)
Eosinophils Relative: 2 %
Eosinophils Relative: 1 %
HCT: 34.1 % — ABNORMAL LOW (ref 36.0–46.0)
HCT: 32.7 % — ABNORMAL LOW (ref 36.0–46.0)
Hemoglobin: 10.7 g/dL — ABNORMAL LOW (ref 12.0–15.0)
Hemoglobin: 10.1 g/dL — ABNORMAL LOW (ref 12.0–15.0)
Immature Granulocytes: 0 %
Immature Granulocytes: 0 %
Lymphocytes Relative: 30 %
Lymphocytes Relative: 18 %
Lymphs Abs: 1.8 10*3/uL (ref 0.7–4.0)
Lymphs Abs: 1.8 10*3/uL (ref 0.7–4.0)
MCH: 29.5 pg (ref 26.0–34.0)
MCH: 29.2 pg (ref 26.0–34.0)
MCHC: 31.4 g/dL (ref 30.0–36.0)
MCHC: 30.9 g/dL (ref 30.0–36.0)
MCV: 93.9 fL (ref 80.0–100.0)
MCV: 94.5 fL (ref 80.0–100.0)
Monocytes Absolute: 0.4 10*3/uL (ref 0.1–1.0)
Monocytes Absolute: 0.8 10*3/uL (ref 0.1–1.0)
Monocytes Relative: 7 %
Monocytes Relative: 8 %
Neutro Abs: 3.6 10*3/uL (ref 1.7–7.7)
Neutro Abs: 7.3 10*3/uL (ref 1.7–7.7)
Neutrophils Relative %: 60 %
Neutrophils Relative %: 73 %
Platelets: 176 10*3/uL (ref 150–400)
Platelets: 165 10*3/uL (ref 150–400)
RBC: 3.63 MIL/uL — ABNORMAL LOW (ref 3.87–5.11)
RBC: 3.46 MIL/uL — ABNORMAL LOW (ref 3.87–5.11)
RDW: 13.6 % (ref 11.5–15.5)
RDW: 13.8 % (ref 11.5–15.5)
WBC: 6 10*3/uL (ref 4.0–10.5)
WBC: 10 10*3/uL (ref 4.0–10.5)
nRBC: 0 % (ref 0.0–0.2)
nRBC: 0 % (ref 0.0–0.2)

## 2021-11-16 LAB — MRSA NEXT GEN BY PCR, NASAL: MRSA by PCR Next Gen: NOT DETECTED

## 2021-11-16 LAB — PROTIME-INR
INR: 1 (ref 0.8–1.2)
Prothrombin Time: 13.4 seconds (ref 11.4–15.2)

## 2021-11-16 LAB — HEPARIN LEVEL (UNFRACTIONATED): Heparin Unfractionated: >1.1 IU/mL — ABNORMAL HIGH (ref 0.30–0.70)

## 2021-11-16 LAB — TROPONIN I (HIGH SENSITIVITY)
Troponin I (High Sensitivity): 17 ng/L (ref <18)
Troponin I (High Sensitivity): 28 ng/L — ABNORMAL HIGH (ref <18)

## 2021-11-16 LAB — SARS CORONAVIRUS 2 BY RT PCR: SARS Coronavirus 2 by RT PCR: NEGATIVE

## 2021-11-16 LAB — APTT: aPTT: 35 seconds (ref 24–36)

## 2021-11-16 LAB — GLUCOSE, CAPILLARY: Glucose-Capillary: 87 mg/dL (ref 70–99)

## 2021-11-16 SURGERY — THROMBECTOMY, ARTERY, FEMORAL
Anesthesia: General | Laterality: Left

## 2021-11-16 MED ORDER — ONDANSETRON HCL 4 MG/2ML IJ SOLN: 4.0000 mg | Freq: Four times a day (QID) | INTRAMUSCULAR | Status: DC | PRN

## 2021-11-16 MED ORDER — SUCCINYLCHOLINE CHLORIDE 200 MG/10ML IV SOSY
PREFILLED_SYRINGE | INTRAVENOUS | Status: DC | PRN
  Administered 2021-11-16: 100 mg via INTRAVENOUS

## 2021-11-16 MED ORDER — PHENYLEPHRINE HCL (PRESSORS) 10 MG/ML IV SOLN
INTRAVENOUS | Status: DC | PRN
  Administered 2021-11-16 (×2): 120 ug via INTRAVENOUS

## 2021-11-16 MED ORDER — PHENYLEPHRINE HCL-NACL 20-0.9 MG/250ML-% IV SOLN
INTRAVENOUS | Status: AC
  Filled 2021-11-16: qty 250

## 2021-11-16 MED ORDER — SUGAMMADEX SODIUM 200 MG/2ML IV SOLN
INTRAVENOUS | Status: DC | PRN
  Administered 2021-11-16: 200 mg via INTRAVENOUS

## 2021-11-16 MED ORDER — HEPARIN SODIUM (PORCINE) 5000 UNIT/ML IJ SOLN
INTRAMUSCULAR | Status: AC
  Filled 2021-11-16: qty 1

## 2021-11-16 MED ORDER — HYDRALAZINE HCL 20 MG/ML IJ SOLN
5.0000 mg | INTRAMUSCULAR | Status: DC | PRN
  Administered 2021-11-20: 5 mg via INTRAVENOUS
  Filled 2021-11-16: qty 1

## 2021-11-16 MED ORDER — HEPARIN SODIUM (PORCINE) 1000 UNIT/ML IJ SOLN
INTRAMUSCULAR | Status: AC
  Filled 2021-11-16: qty 10

## 2021-11-16 MED ORDER — SODIUM CHLORIDE 0.9 % IV SOLN
500.0000 mL | Freq: Once | INTRAVENOUS | Status: AC | PRN
  Administered 2021-11-17: 500 mL via INTRAVENOUS

## 2021-11-16 MED ORDER — VANCOMYCIN HCL 1000 MG IV SOLR
INTRAVENOUS | Status: DC | PRN
  Administered 2021-11-16: 1000 mg via INTRAVENOUS

## 2021-11-16 MED ORDER — ALUM & MAG HYDROXIDE-SIMETH 200-200-20 MG/5ML PO SUSP: 15.0000 mL | ORAL | Status: DC | PRN

## 2021-11-16 MED ORDER — GUAIFENESIN-DM 100-10 MG/5ML PO SYRP: 15.0000 mL | ORAL_SOLUTION | ORAL | Status: DC | PRN

## 2021-11-16 MED ORDER — DOCUSATE SODIUM 100 MG PO CAPS
100.0000 mg | ORAL_CAPSULE | Freq: Every day | ORAL | Status: DC
Start: 2021-11-17 — End: 2021-11-25
  Administered 2021-11-17 – 2021-11-25 (×8): 100 mg via ORAL
  Filled 2021-11-16 (×9): qty 1

## 2021-11-16 MED ORDER — LIDOCAINE HCL (CARDIAC) PF 100 MG/5ML IV SOSY
PREFILLED_SYRINGE | INTRAVENOUS | Status: DC | PRN
  Administered 2021-11-16: 100 mg via INTRAVENOUS

## 2021-11-16 MED ORDER — POTASSIUM CHLORIDE CRYS ER 20 MEQ PO TBCR: 20.0000 meq | EXTENDED_RELEASE_TABLET | Freq: Every day | ORAL | Status: DC | PRN

## 2021-11-16 MED ORDER — 0.9 % SODIUM CHLORIDE (POUR BTL) OPTIME
TOPICAL | Status: DC | PRN
  Administered 2021-11-16: 100 mL

## 2021-11-16 MED ORDER — IOHEXOL 350 MG/ML SOLN
125.0000 mL | Freq: Once | INTRAVENOUS | Status: AC | PRN
Start: 2021-11-16 — End: 2021-11-16
  Administered 2021-11-16: 125 mL via INTRAVENOUS

## 2021-11-16 MED ORDER — PHENYLEPHRINE HCL-NACL 20-0.9 MG/250ML-% IV SOLN
INTRAVENOUS | Status: DC | PRN
  Administered 2021-11-16: 20 ug/min via INTRAVENOUS

## 2021-11-16 MED ORDER — POLYETHYLENE GLYCOL 3350 17 G PO PACK: 17.0000 g | PACK | Freq: Every day | ORAL | Status: DC | PRN

## 2021-11-16 MED ORDER — HEPARIN BOLUS VIA INFUSION
5000.0000 [IU] | Freq: Once | INTRAVENOUS | Status: AC
  Administered 2021-11-16: 5000 [IU] via INTRAVENOUS
  Filled 2021-11-16: qty 5000

## 2021-11-16 MED ORDER — BISACODYL 10 MG RE SUPP: 10.0000 mg | Freq: Every day | RECTAL | Status: DC | PRN

## 2021-11-16 MED ORDER — METOPROLOL TARTRATE 5 MG/5ML IV SOLN: 2.0000 mg | INTRAVENOUS | Status: DC | PRN

## 2021-11-16 MED ORDER — PROPOFOL 10 MG/ML IV BOLUS
INTRAVENOUS | Status: AC
  Filled 2021-11-16: qty 20

## 2021-11-16 MED ORDER — ONDANSETRON HCL 4 MG/2ML IJ SOLN
INTRAMUSCULAR | Status: AC
  Filled 2021-11-16: qty 2

## 2021-11-16 MED ORDER — OXYCODONE-ACETAMINOPHEN 5-325 MG PO TABS
1.0000 | ORAL_TABLET | ORAL | Status: DC | PRN
  Administered 2021-11-16 – 2021-11-21 (×10): 1 via ORAL
  Filled 2021-11-16 (×2): qty 1
  Filled 2021-11-16: qty 2
  Filled 2021-11-16 (×9): qty 1

## 2021-11-16 MED ORDER — LABETALOL HCL 5 MG/ML IV SOLN
10.0000 mg | INTRAVENOUS | Status: DC | PRN
  Administered 2021-11-18 – 2021-11-21 (×2): 10 mg via INTRAVENOUS
  Filled 2021-11-16 (×2): qty 4

## 2021-11-16 MED ORDER — PHENOL 1.4 % MT LIQD: 1.0000 | OROMUCOSAL | Status: DC | PRN

## 2021-11-16 MED ORDER — ASPIRIN 81 MG PO TBEC
81.0000 mg | DELAYED_RELEASE_TABLET | Freq: Every day | ORAL | Status: DC
  Administered 2021-11-17 – 2021-11-25 (×6): 81 mg via ORAL
  Filled 2021-11-16 (×7): qty 1

## 2021-11-16 MED ORDER — FENTANYL CITRATE (PF) 100 MCG/2ML IJ SOLN
INTRAMUSCULAR | Status: DC | PRN
  Administered 2021-11-16: 50 ug via INTRAVENOUS
  Administered 2021-11-16 (×2): 25 ug via INTRAVENOUS

## 2021-11-16 MED ORDER — LACTATED RINGERS IV SOLN: INTRAVENOUS | Status: DC | PRN

## 2021-11-16 MED ORDER — PROPOFOL 10 MG/ML IV BOLUS
INTRAVENOUS | Status: DC | PRN
  Administered 2021-11-16: 100 mg via INTRAVENOUS

## 2021-11-16 MED ORDER — CHLORHEXIDINE GLUCONATE CLOTH 2 % EX PADS
6.0000 | MEDICATED_PAD | Freq: Every day | CUTANEOUS | Status: DC
  Administered 2021-11-16 – 2021-11-22 (×6): 6 via TOPICAL

## 2021-11-16 MED ORDER — MAGNESIUM SULFATE 2 GM/50ML IV SOLN: 2.0000 g | Freq: Every day | INTRAVENOUS | Status: DC | PRN

## 2021-11-16 MED ORDER — VANCOMYCIN HCL 1000 MG IV SOLR
INTRAVENOUS | Status: AC
  Filled 2021-11-16: qty 20

## 2021-11-16 MED ORDER — HEPARIN (PORCINE) 25000 UT/250ML-% IV SOLN
1350.0000 [IU]/h | INTRAVENOUS | Status: DC
  Administered 2021-11-16 (×2): 1350 [IU]/h via INTRAVENOUS
  Filled 2021-11-16 (×2): qty 250

## 2021-11-16 MED ORDER — ONDANSETRON HCL 4 MG/2ML IJ SOLN
INTRAMUSCULAR | Status: DC | PRN
  Administered 2021-11-16: 4 mg via INTRAVENOUS

## 2021-11-16 MED ORDER — MORPHINE SULFATE (PF) 2 MG/ML IV SOLN
2.0000 mg | INTRAVENOUS | Status: DC | PRN
  Administered 2021-11-18: 2 mg via INTRAVENOUS
  Administered 2021-11-19: 4 mg via INTRAVENOUS
  Administered 2021-11-19: 2 mg via INTRAVENOUS
  Filled 2021-11-16 (×2): qty 1
  Filled 2021-11-16: qty 2

## 2021-11-16 MED ORDER — OXYCODONE HCL 5 MG PO TABS
5.0000 mg | ORAL_TABLET | Freq: Once | ORAL | Status: AC | PRN
  Administered 2021-11-16: 5 mg via ORAL

## 2021-11-16 MED ORDER — SODIUM CHLORIDE 0.9 % IV SOLN: INTRAVENOUS | Status: DC

## 2021-11-16 MED ORDER — ACETAMINOPHEN 325 MG PO TABS
325.0000 mg | ORAL_TABLET | ORAL | Status: DC | PRN
  Administered 2021-11-21: 650 mg via ORAL
  Filled 2021-11-16 (×2): qty 2

## 2021-11-16 MED ORDER — OXYCODONE HCL 5 MG/5ML PO SOLN: 5.0000 mg | Freq: Once | ORAL | Status: AC | PRN

## 2021-11-16 MED ORDER — FENTANYL CITRATE PF 50 MCG/ML IJ SOSY
50.0000 ug | PREFILLED_SYRINGE | Freq: Once | INTRAMUSCULAR | Status: AC
  Administered 2021-11-16: 50 ug via INTRAVENOUS
  Filled 2021-11-16: qty 1

## 2021-11-16 MED ORDER — HEPARIN (PORCINE) 25000 UT/250ML-% IV SOLN
1550.0000 [IU]/h | INTRAVENOUS | Status: DC
  Administered 2021-11-17: 1200 [IU]/h via INTRAVENOUS
  Administered 2021-11-18: 1400 [IU]/h via INTRAVENOUS
  Administered 2021-11-20: 1550 [IU]/h via INTRAVENOUS
  Filled 2021-11-16 (×4): qty 250

## 2021-11-16 MED ORDER — EPHEDRINE SULFATE (PRESSORS) 50 MG/ML IJ SOLN
INTRAMUSCULAR | Status: DC | PRN
  Administered 2021-11-16 (×2): 10 mg via INTRAVENOUS

## 2021-11-16 MED ORDER — FLEET ENEMA 7-19 GM/118ML RE ENEM: 1.0000 | ENEMA | Freq: Once | RECTAL | Status: DC | PRN

## 2021-11-16 MED ORDER — FENTANYL CITRATE (PF) 100 MCG/2ML IJ SOLN
INTRAMUSCULAR | Status: AC
  Filled 2021-11-16: qty 2

## 2021-11-16 MED ORDER — ONDANSETRON HCL 4 MG/2ML IJ SOLN
4.0000 mg | Freq: Once | INTRAMUSCULAR | Status: AC
Start: 2021-11-16 — End: 2021-11-16
  Administered 2021-11-16: 4 mg via INTRAVENOUS
  Filled 2021-11-16: qty 2

## 2021-11-16 MED ORDER — ORAL CARE MOUTH RINSE: 15.0000 mL | OROMUCOSAL | Status: DC | PRN

## 2021-11-16 MED ORDER — EPHEDRINE 5 MG/ML INJ
INTRAVENOUS | Status: AC
  Filled 2021-11-16: qty 10

## 2021-11-16 MED ORDER — FENTANYL CITRATE (PF) 100 MCG/2ML IJ SOLN: 25.0000 ug | INTRAMUSCULAR | Status: DC | PRN

## 2021-11-16 MED ORDER — OXYCODONE HCL 5 MG PO TABS
ORAL_TABLET | ORAL | Status: AC
  Filled 2021-11-16: qty 1

## 2021-11-16 MED ORDER — SODIUM CHLORIDE 0.9 % IV BOLUS (SEPSIS): 1000.0000 mL | Freq: Once | INTRAVENOUS | Status: DC

## 2021-11-16 MED ORDER — ACETAMINOPHEN 650 MG RE SUPP: 325.0000 mg | RECTAL | Status: DC | PRN

## 2021-11-16 MED ORDER — ROCURONIUM BROMIDE 100 MG/10ML IV SOLN
INTRAVENOUS | Status: DC | PRN
  Administered 2021-11-16: 50 mg via INTRAVENOUS

## 2021-11-16 MED ORDER — PANTOPRAZOLE SODIUM 40 MG PO TBEC
40.0000 mg | DELAYED_RELEASE_TABLET | Freq: Every day | ORAL | Status: DC
Start: 2021-11-16 — End: 2021-11-25
  Administered 2021-11-16 – 2021-11-25 (×9): 40 mg via ORAL
  Filled 2021-11-16 (×10): qty 1

## 2021-11-16 MED ORDER — HEPARIN SODIUM (PORCINE) 1000 UNIT/ML IJ SOLN
INTRAMUSCULAR | Status: DC | PRN
  Administered 2021-11-16: 10000 [IU] via INTRAVENOUS

## 2021-11-16 MED ORDER — SODIUM CHLORIDE 0.9 % IV SOLN
INTRAVENOUS | Status: DC | PRN
  Administered 2021-11-16: 501 mL

## 2021-11-16 SURGICAL SUPPLY — 69 items
ADH SKN CLS APL DERMABOND .7 (GAUZE/BANDAGES/DRESSINGS) ×1
APL PRP STRL LF DISP 70% ISPRP (MISCELLANEOUS) ×2
APPLIER CLIP 11 MED OPEN (CLIP) ×1
APR CLP MED 11 20 MLT OPN (CLIP) ×1
BASIN GRAD PLASTIC 32OZ STRL (MISCELLANEOUS) IMPLANT
BLADE SURG SZ11 CARB STEEL (BLADE) ×2 IMPLANT
BOOT SUTURE AID YELLOW STND (SUTURE) ×2 IMPLANT
BULB RESERV EVAC DRAIN JP 100C (MISCELLANEOUS) IMPLANT
CATH EMBOLECTOMY 2X60 (CATHETERS) IMPLANT
CATH EMBOLECTOMY 3X80 (CATHETERS) IMPLANT
CHLORAPREP W/TINT 26 (MISCELLANEOUS) ×4 IMPLANT
CLIP APPLIE 11 MED OPEN (CLIP) IMPLANT
DERMABOND ADVANCED (GAUZE/BANDAGES/DRESSINGS) ×1
DERMABOND ADVANCED .7 DNX12 (GAUZE/BANDAGES/DRESSINGS) ×2 IMPLANT
DRAIN JP 15F RND TROCAR (DRAIN) IMPLANT
DRAPE 3/4 80X56 (DRAPES) ×2 IMPLANT
DRSG AQUACEL ADVANTAGE 4X5 (GAUZE/BANDAGES/DRESSINGS) IMPLANT
DRSG OPSITE POSTOP 4X6 (GAUZE/BANDAGES/DRESSINGS) IMPLANT
DRSG TEGADERM 4X4.75 (GAUZE/BANDAGES/DRESSINGS) IMPLANT
ELECT CAUTERY BLADE 6.4 (BLADE) ×2 IMPLANT
ELECT REM PT RETURN 9FT ADLT (ELECTROSURGICAL) ×1
ELECTRODE REM PT RTRN 9FT ADLT (ELECTROSURGICAL) ×2 IMPLANT
GAUZE 4X4 16PLY ~~LOC~~+RFID DBL (SPONGE) ×2 IMPLANT
GLOVE SURG SYN 8.0 (GLOVE) ×1 IMPLANT
GLOVE SURG SYN 8.0 PF PI (GLOVE) ×2 IMPLANT
GOWN STRL REUS W/ TWL LRG LVL3 (GOWN DISPOSABLE) ×2 IMPLANT
GOWN STRL REUS W/TWL LRG LVL3 (GOWN DISPOSABLE) ×2
HEMOSTAT SURGICEL 2X3 (HEMOSTASIS) IMPLANT
IV NS 500ML (IV SOLUTION) ×1
IV NS 500ML BAXH (IV SOLUTION) ×2 IMPLANT
KIT TURNOVER KIT A (KITS) ×2 IMPLANT
LOOP RED MAXI  1X406MM (MISCELLANEOUS) ×2
LOOP VESSEL MAXI  1X406 RED (MISCELLANEOUS) ×2
LOOP VESSEL MAXI 1X406 RED (MISCELLANEOUS) ×6 IMPLANT
LOOP VESSEL MINI 0.8X406 BLUE (MISCELLANEOUS) ×4 IMPLANT
LOOPS BLUE MINI 0.8X406MM (MISCELLANEOUS) ×2
MANIFOLD NEPTUNE II (INSTRUMENTS) ×2 IMPLANT
NDL HYPO 18GX1.5 BLUNT FILL (NEEDLE) ×2 IMPLANT
NEEDLE HYPO 18GX1.5 BLUNT FILL (NEEDLE) ×1 IMPLANT
NS IRRIG 500ML POUR BTL (IV SOLUTION) ×2 IMPLANT
PACK BASIN MAJOR ARMC (MISCELLANEOUS) ×2 IMPLANT
PATCH CAROTID ECM VASC 1X10 (Prosthesis & Implant Heart) IMPLANT
RETRACTOR TRAXI PANNICULUS (MISCELLANEOUS) IMPLANT
SPONGE DRAIN TRACH 4X4 STRL 2S (GAUZE/BANDAGES/DRESSINGS) IMPLANT
SPONGE KITTNER 5P (MISCELLANEOUS) IMPLANT
SPONGE T-LAP 18X18 ~~LOC~~+RFID (SPONGE) ×4 IMPLANT
STAPLER SKIN PROX 35W (STAPLE) IMPLANT
STOCKINETTE M/LG 89821 (MISCELLANEOUS) IMPLANT
SUT ETHILON 3-0 FS-10 30 BLK (SUTURE) ×1
SUT MNCRL+ 5-0 UNDYED PC-3 (SUTURE) ×2 IMPLANT
SUT MONOCRYL 5-0 (SUTURE) ×1
SUT PROLENE 5 0 RB 1 DA (SUTURE) IMPLANT
SUT PROLENE 6 0 BV (SUTURE) IMPLANT
SUT SILK 2 0 (SUTURE) ×1
SUT SILK 2-0 18XBRD TIE 12 (SUTURE) ×2 IMPLANT
SUT SILK 3 0 (SUTURE) ×1
SUT SILK 3-0 18XBRD TIE 12 (SUTURE) ×2 IMPLANT
SUT SILK 4 0 (SUTURE) ×1
SUT SILK 4-0 18XBRD TIE 12 (SUTURE) ×2 IMPLANT
SUT VIC AB 2-0 CT1 27 (SUTURE) ×1
SUT VIC AB 2-0 CT1 TAPERPNT 27 (SUTURE) ×4 IMPLANT
SUT VIC AB 3-0 SH 27 (SUTURE) ×1
SUT VIC AB 3-0 SH 27X BRD (SUTURE) IMPLANT
SUTURE EHLN 3-0 FS-10 30 BLK (SUTURE) IMPLANT
SYR 20ML LL LF (SYRINGE) ×2 IMPLANT
SYR 3ML LL SCALE MARK (SYRINGE) ×2 IMPLANT
TRAP FLUID SMOKE EVACUATOR (MISCELLANEOUS) ×2 IMPLANT
TRAXI PANNICULUS RETRACTOR (MISCELLANEOUS) ×1
WATER STERILE IRR 500ML POUR (IV SOLUTION) ×2 IMPLANT

## 2021-11-16 NOTE — Op Note (Signed)
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  Disarticulated calcified proximal LEFT external iliac artery (EIA) posterior plaque adjacent to calcified iliofemoral posterior plaque 3.  Prior to intervention:  Palpable LEFT EIA pulse and no palpable pulse in CFA, proximal SFA, and proximal PFA 4.  Sub-acute thrombus in LEFT distal common femoral artery (CFA) 5.  Sub-acute thrombus in LEFT superficial femoral artery (SFA) 6.  Sub-acute thrombus in LEFT profunda femoral artery (PFA) 7.  Sub-acute thrombus in LEFT popliteal artery (POP)  SPECIMENS:  LEFT posterior iliofemoral plaque, thrombus from LEFT femoropopliteal artery  INDICATIONS:   Rebecca Gilmore is a 81 y.o. female who presents with acute on chronic PAD with LEFT femoropopliteal artery occlusion.  I recommended: emergent thrombectomy, other indicated procedures including possible fasciotomy and VAC placement.  Risks include but are not limited to: bleeding, infection, nerve damage, wound complications, inability to revascularize the LEFT leg leading to limb loss, need for additional procedure, myocardial infarction, stroke, and death.  The patient is aware of increased risk of wound complications due to her obesity.  The patient is also aware that the window for limb salvage may have already passed.  The patient is aware of the risks and elects to proceed.   DESCRIPTION: After obtaining full informed written consent, the patient was brought back to the operating room and placed supine upon the operating table.  The patient received IV antibiotics prior to induction.  A procedure time out was completed and the  correct surgical site was verified.  After obtaining adequate anesthesia, the patient was prepped and draped in the standard fashion for: LEFT leg thrombectomy.    The patient's pannus was retracted proximally with adhesive retraction device.  I interrogated the LEFT groin with a Sonosite.  I made a longitudinal incision over the LEFT CFA.  I dissected down to the CFA.  The artery was >5 cm deep.  I dissected out the CFA from the distal EIA down to the femoral bifurcation.  There was obvious thrombus in the distal CFA, proximal SFA and proximal PFA.  I place vessel loops around all of this these artery.    I dissected more proximal on the CFA.  There was a calcified plaque extending from the distal CFA into EIA.  Upon examination, there was two different plaques: one extending from the distal CFA into the EIA and another calcified posterior plaque in the distal EIA just proximal to the iliofemoral plaque.  I felt I could clamp the distal EIA between the two plaques.  I gave the patient 10000 units of Heparin to obtain therapeutic bolus given the patient was already on Heparin drip from the Emergency Department.  I clamped the distal EIA and then put the SFA and PFA under tension.  I made an incision in the distal CFA.  Immediately, sub-acute thrombus ejected from the distal CFA.  I extended the arteriotomy proximally into the proximal CFA and distally into the SFA.  There was some difficulty controlling the bleeding, so I had to adjust the position of the distal EIA clamp.  I also had to clamp the PFA and SFA.  I washed out the arteriotomy.  There was an obvious  calcified posterior plaque that was loose.   I completed the endarterectomy with minimal dissection, removing the posterior plaque in this CFA extending slightly into the distal EIA.  I hand to extend the endarterectomy into the proximal SFA and PFA, getting the plaque to feather out in both.    At this point, I passed a 3-Fr Fogarty catheter down  the SFA, all the way down to 70 cm, essential at the ankle level.  I retrieved sub-acute thrombus on two passes.  I passed another two passes without any thrombus.  I passed a 3-Fr Fogarty catheter down the PFA, getting a small amount of sub-acute thrombus.  I made another two negative passes.  I then tried to passed the 3-Fr Fogarty catheter proximally, but the artery was bleeding under high pressure and the catheter was catching on the more proximal EIA posterior plaque.  There was a strong pulse in the distal EIA.  I bled the distal EIA: pulsatile pulse free bleeding.  I reclamped the distal EIA.  I washed out the arteriotomy.  Residual intimal flaps were removed.  I fashioned a CorMatrix patch for the geometry of the arteriotomy.  I sewed the patch onto the CFA and SFA with two running stitches of 6-0 Prolene.  Prior to completion, I backbled all arteries: no clot was noted.  I completed the patch angioplasty in the usual fashion.  I repaired one area of the suture lines with a stitch of 6-0 Prolene.  I released all clamps.  Immediately, there was a pulse in the CFA, SFA, and PFA.  Multiphasic doppler signals were present in all arteries.  Distally, I could doppler a PTA and peroneal signal.  I packed the LEFT groin with Surgicel.  I packed the LEFT groin with a dry lap pad.  After holding pressure for a few minutes, I washed out the LEFT groin.  No active bleeding was noted.  I repacked the LEFT groin with some Surgicel.  I repacked the LEFT groin with a dry lap pad.  After waiting a few minutes, I washed out the LEFT groin again.  No active bleeding was noted.    At this point, I routed a 15-Fr round drain through the subcutaneous tissue and through the proximal anterior thigh.  I cut off the trocar.  I pulled the drain into position and secured it with a 3-0 Nylon stitch tied to the drain.  I cut the drain to length, putting the drain adjacent to the distal EIA and CFA.    I reapproximated the deep  tissue immediately over the iliofemoral artery with a double layer of 2-0 Vicryl.  I then reapproximated the superficial subcutaneous tissue with a double layer of 3-0 Vicryl.  The skin was reapproximated with a running stitch of 4-0 Monocryl.  The skin was cleaned, dried, and reinforced with Dermabond.  I also tried to reinforce the pre-existing skin tear with Dermabond.  An Aquacel Ag was placed over the incision and tear.  A sterile honeycomb dressing was applied on top the Aquacel Ag.  A drain sponge was applied to the exit site of the drain.  A suction bulb was applied to the drain and charged.   COMPLICATIONS: none  CONDITION: stable   Adele Barthel, MD, FACS, FSVS Covering for [facility]: [phone]  11/16/2021, 10:14 AM

## 2021-11-16 NOTE — H&P (Signed)
   History and Physical Update  The patient was interviewed and examined.  The patient's previous Vascular Consult has been reviewed and is unchanged.  There is no change in the plan of care.  Adele Barthel, MD, FACS, FSVS Covering for [facility]: [phone]  11/16/2021, 5:50 AM

## 2021-11-16 NOTE — Transfer of Care (Signed)
Immediate Anesthesia Transfer of Care Note  Patient: Rebecca Gilmore  Procedure(s) Performed: THROMBECTOMY FEMORAL ARTERY (Left)  Patient Location: PACU  Anesthesia Type:General  Level of Consciousness: awake  Airway & Oxygen Therapy: Patient connected to nasal cannula oxygen  Post-op Assessment: Report given to RN  Post vital signs: stable  Last Vitals:  Vitals Value Taken Time  BP 108/67 11/16/21 0930  Temp 36.3 C 11/16/21 0930  Pulse 59 11/16/21 0939  Resp 20 11/16/21 0939  SpO2 94 % 11/16/21 0939  Vitals shown include unvalidated device data.  Last Pain:  Vitals:   11/16/21 0412  TempSrc: Oral  PainSc: 0-No pain         Complications: No notable events documented.

## 2021-11-16 NOTE — Consult Note (Addendum)
VASCULAR SURGERY CONSULTATION   Requested by:  [facility]  Reason for consultation: Left leg pain    History of Present Illness   Rebecca Gilmore is a 81 y.o. (January 19, 1941) female who presents with cc: acute onset of LEFT leg pain.  Pt woke up at 2 am to go to the bathroom.  She noted severe pain in left leg of vague character while walking.  Pt also noticed loss of sensation in LEFT foot.  With some further discussion, pt baseline has some LEFT hip pain with intermittent cramping in LEFT lower leg with ambulation.  Patient's risk factors for atherosclerosis include: HTN, prior smoker.  Pt quit smoking in 1994.  Pt also notes some SOB since coming back from CT scan.  Past Medical History:  Diagnosis Date   Depression    HBP (high blood pressure)    History of bladder problems    Memory loss    Muscle pain    Osteoporosis    Reflux    Sinus congestion    Swelling    B/L FEET AND LEGS    Past Surgical History:  Procedure Laterality Date   ARTERY BIOPSY Bilateral 12/01/2016   Procedure: BIOPSY TEMPORAL ARTERY;  Surgeon: Conrad Suring, MD;  Location: [place];  Service: Vascular;  Laterality: Bilateral;   CATARACT SURGERY     EYELID SURGERY Bilateral    GROWTH ON FACE     KNEE SURGERY Right      Social History   Socioeconomic History   Marital status: Married    Spouse name: Not on file   Number of children: Not on file   Years of education: Not on file   Highest education level: Not on file  Occupational History   Not on file  Tobacco Use   Smoking status: Former    Types: Cigarettes    Quit date: 01/12/1993    Years since quitting: 28.8   Smokeless tobacco: Former    Types: Snuff, Chew   Tobacco comments:    DATE QUIT UNKNOWN  Vaping Use   Vaping Use: Never used  Substance and Sexual Activity   Alcohol use: No   Drug use: No   Sexual activity: Not on file  Other Topics Concern   Not on file  Social History Narrative   Not on file   Social Determinants  of Health   Financial Resource Strain: Not on file  Food Insecurity: Not on file  Transportation Needs: Not on file  Physical Activity: Not on file  Stress: Not on file  Social Connections: Not on file  Intimate Partner Violence: Not on file   Family History  Problem Relation Age of Onset   Heart disease Mother    AAA (abdominal aortic aneurysm) Mother    Heart disease Father     Current Facility-Administered Medications  Medication Dose Route Frequency Provider Last Rate Last Admin   heparin ADULT infusion 100 units/mL (25000 units/268m)  1,350 Units/hr Intravenous Continuous BRenda Rolls RPH 13.5 mL/hr at 11/16/21 0523 1,350 Units/hr at 11/16/21 0523   sodium chloride 0.9 % bolus 1,000 mL  1,000 mL Intravenous Once Ward, Kristen N, DO       triamcinolone acetonide (KENALOG-40) injection 20 mg  20 mg Intramuscular Once Hyatt, Max T, DPM       Current Outpatient Medications  Medication Sig Dispense Refill   amitriptyline (ELAVIL) 25 MG tablet Take 25 mg by mouth at bedtime.  cholecalciferol (VITAMIN D3) 25 MCG (1000 UT) tablet Take 1,000 Units by mouth daily.     cyanocobalamin (VITAMIN B12) 1000 MCG tablet Take 1,000 mcg by mouth.     Ferrous Sulfate (IRON) 325 (65 Fe) MG TABS Take 1 tablet by mouth daily.     furosemide (LASIX) 40 MG tablet Take 40 mg by mouth daily. For fluid retention (swollen ankles)     loratadine (CLARITIN) 10 MG tablet Take 10 mg by mouth daily.     metoprolol succinate (TOPROL-XL) 25 MG 24 hr tablet Take 12.5 mg by mouth daily.     pantoprazole (PROTONIX) 40 MG tablet Take 40 mg by mouth every morning.     predniSONE (DELTASONE) 1 MG tablet Take 2 mg by mouth daily.     pregabalin (LYRICA) 75 MG capsule Take 1 capsule (75 mg total) by mouth 2 (two) times daily. (Patient taking differently: Take 75 mg by mouth daily.) 90 capsule 1   rosuvastatin (CRESTOR) 5 MG tablet TAKE 1 TABLET BY MOUTH AT BEDTIME FOR HIGH CHOLESTEROL     sertraline (ZOLOFT) 50  MG tablet Take 25 mg by mouth daily.  0    Allergies  Allergen Reactions   Kenalog [Triamcinolone Acetonide] Other (See Comments)    FLUSH IN FACE   Penicillins Other (See Comments)    Has patient had a PCN reaction causing immediate rash, facial/tongue/throat swelling, SOB or lightheadedness with hypotension: Unknown Has patient had a PCN reaction causing severe rash involving mucus membranes or skin necrosis: Unknown Has patient had a PCN reaction that required hospitalization:No Has patient had a PCN reaction occurring within the last 10 years: No If all of the above answers are "NO", then may proceed with Cephalosporin use.     REVIEW OF SYSTEMS (negative unless checked):   Cardiac:  _0  Chest pain or chest pressure? _1  Shortness of breath upon activity? _2  Shortness of breath when lying flat? _3  Irregular heart rhythm?  Vascular:  _4  Pain in calf, thigh, or hip brought on by walking? _5  Pain in feet at night that wakes you up from your sleep? _6  Blood clot in your veins? _7  Leg swelling?  Pulmonary:  _8  Oxygen at home? _9  Productive cough? _10  Wheezing?  Neurologic:  _11  Sudden weakness in arms or legs? _12  Sudden numbness in arms or legs? _13  Sudden onset of difficult speaking or slurred speech? _14  Temporary loss of vision in one eye? _15  Problems with dizziness?  Gastrointestinal:  _16  Blood in stool? _17  Vomited blood?  Genitourinary:  _18  Burning when urinating? _19  Blood in urine?  Psychiatric:  _20  Major depression  Hematologic:  _21  Bleeding problems? _22  Problems with blood clotting?  Dermatologic:  _23  Rashes or ulcers?  Constitutional:  _24  Fever or chills?  Ear/Nose/Throat:  _25  Change in hearing? _26  Nose bleeds? _27  Sore throat?  Musculoskeletal:  _28  Back pain? _29  Joint pain? _30  Muscle pain?   Physical Examination     Vitals:   11/16/21 0412 11/16/21 0413  BP: (!) 180/59   Pulse: (!) 59   Resp: 18   Temp: 98.3 F (36.8 C)   TempSrc:  Oral   SpO2: 97%   Weight:  107.5 kg  Height:  _31  (1.626 m)   Body mass index is 40.68 kg/m.  General Alert, O x 3, Obese, Ill appearing  Head [place]/AT,    Ear/Nose/ Throat Hearing grossly intact, nares without erythema or drainage, oropharynx without Erythema or Exudate,  Eyes PERRLA, EOMI,   Neck  Supple, mid-line trachea,   Pulmonary Sym exp, good B air movt, CTA B, some dyspnea, no stridor  Cardiac RRR, Nl S1, S2, no Murmurs, No rubs, No S3,S4  Vascular Vessel Right Left  Radial Palpable Palpable  Brachial Palpable Palpable  Carotid Palpable, No Bruit Palpable, No Bruit  Aorta Not palpable N/A  Femoral Faintly palpable Not palpable  Popliteal Not palpable Not palpable  PT Not palpable Not palpable  DP Not palpable Not palpable    Gastro- intestinal soft, non-distended, non-tender to palpation, No guarding or rebound, no HSM, no masses, no CVAT B, No palpable prominent aortic pulse, large pannus overlapping groins  Musculo- skeletal M/S 5/5 throughout including L DF/PF , Extremities without ischemic changes, L leg colder to touch than R  Neurologic CN grossly intact, Pain and light touch intact in extremities intact arm and R leg, decreased sensation in L foot: insensate, Motor exam as listed above  Psychiatric Judgement intact, Mood & affect appropriate for pt's clinical situation  Dermatologic See M/S exam for extremity exam,   Lymphatic  Palpable lymph nodes: None   Laboratory   CBC    Latest Ref Rng & Units 11/16/2021    4:30 AM 01/30/2020   10:51 AM 01/23/2019   11:00 AM  CBC  WBC 4.0 - 10.5 K/uL 6.0  5.4  7.3   Hemoglobin 12.0 - 15.0 g/dL 10.7  11.7  10.6   Hematocrit 36.0 - 46.0 % 34.1  36.0  33.6   Platelets 150 - 400 K/uL 176  228  230     BMP    Latest Ref Rng & Units 11/16/2021    4:30 AM 01/30/2020   10:51 AM 01/23/2019   11:00 AM  BMP  Glucose 70 - 99 mg/dL 122  115  113   BUN 8 - 23 mg/dL 35  23  25   Creatinine 0.44 - 1.00 mg/dL 1.68  1.70   1.53   Sodium 135 - 145 mmol/L 143  139  136   Potassium 3.5 - 5.1 mmol/L 3.6  3.4  3.7   Chloride 98 - 111 mmol/L 106  99  102   CO2 22 - 32 mmol/L _0 Calcium 8.9 - 10.3 mg/dL 9.4  9.5  9.0     Coagulation Lab Results  Component Value Date   INR 1.0 11/16/2021   INR 1.0 08/02/2008    Radiology     CTA abd/pelvis with B leg runoff (11/16/21): no official read Intermittent calcific atherosclerosis through arterial system Patent aortoiliac segments Occluded LEFT CFA with poor visualization of other distal arteries On presumed venous phase imaging, some reconstitution of BK pop with peroneal runoff Possible mid-calf peroneal reocclusion vs high grade stenosis   Medical Decision Making   Rebecca Gilmore is a 81 y.o. 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 Hx also suggests acute change this AM c/w examination and CTA I recommend: emergent thrombectomy, other indicated procedures including possible fasciotomy and VAC placement Risks include but are not limited to: bleeding, infection, nerve damage, wound complications, inability to revascularize the LEFT leg leading to limb loss, need for additional procedure, myocardial infarction, stroke, and death. The patient is aware of increased risk of wound complications due to her obesity. The patient is also aware that the window for limb salvage may have already passed, as while she became aware of the leg pain at 2 am,  the arterial occlusion likely occurred earlier.   The patient was seen roughly at 5 am.   The patient is aware of the risks and elects to proceed.   Adele Barthel, MD, FACS, FSVS Covering for [facility]: [phone]  11/16/2021, 5:35 AM

## 2021-11-16 NOTE — Anesthesia Procedure Notes (Signed)
Procedure Name: Intubation Date/Time: 11/16/2021 6:51 AM  Performed by: Sherrine Maples, CRNAPre-anesthesia Checklist: Patient identified, Patient being monitored, Timeout performed, Emergency Drugs available and Suction available Patient Re-evaluated:Patient Re-evaluated prior to induction Oxygen Delivery Method: Circle system utilized Preoxygenation: Pre-oxygenation with 100% oxygen Induction Type: IV induction Laryngoscope Size: 3 and McGraph Grade View: Grade I Tube type: Oral Tube size: 7.0 mm Number of attempts: 1 Airway Equipment and Method: Stylet Placement Confirmation: ETT inserted through vocal cords under direct vision, positive ETCO2 and breath sounds checked- equal and bilateral Secured at: 19 cm Tube secured with: Tape Dental Injury: Teeth and Oropharynx as per pre-operative assessment

## 2021-11-16 NOTE — Progress Notes (Signed)
[place] for heparin infusion Indication: DVT  Allergies  Allergen Reactions   Kenalog [Triamcinolone Acetonide] Other (See Comments)    FLUSH IN FACE   Penicillins Other (See Comments)    Has patient had a PCN reaction causing immediate rash, facial/tongue/throat swelling, SOB or lightheadedness with hypotension: Unknown Has patient had a PCN reaction causing severe rash involving mucus membranes or skin necrosis: Unknown Has patient had a PCN reaction that required hospitalization:No Has patient had a PCN reaction occurring within the last 10 years: No If all of the above answers are "NO", then may proceed with Cephalosporin use.     Patient Measurements: Height: _0  (162.6 cm) Weight: 107.5 kg (237 lb) IBW/kg (Calculated) : 54.7 Heparin Dosing Weight: 80.1 kg  Vital Signs: Temp: 98.3 F (36.8 C) (09/10 0412) Temp Source: Oral (09/10 0412) BP: 180/59 (09/10 0412) Pulse Rate: 59 (09/10 0412)  Labs: Recent Labs    11/16/21 0430  HGB 10.7*  HCT 34.1*  PLT 176  LABPROT 13.4  INR 1.0    CrCl cannot be calculated (Patient's most recent lab result is older than the maximum 21 days allowed.).   Medical History: Past Medical History:  Diagnosis Date   Depression    HBP (high blood pressure)    History of bladder problems    Memory loss    Muscle pain    Osteoporosis    Reflux    Sinus congestion    Swelling    B/L FEET AND LEGS    Assessment: Pt is a 81 yo female presenting to ED c/o LLE pain, discoloration, numbness, & cold to touch.  Goal of Therapy:  Heparin level 0.3-0.7 units/ml Monitor platelets by anticoagulation protocol: Yes   Plan:  Bolus 5000 units x 1 Start heparin infusion at 1350 units/hr Will check HL in 8 hr after start of infusion CBC daily while on heparin  Renda Rolls, PharmD, [facility] 11/16/2021 4:48 AM

## 2021-11-16 NOTE — Progress Notes (Signed)
Daily Progress Note   Assessment/Planning:   POD #0 s/p L iliofemoral TE w/ patch angioplasty  No evidence of compartment syndrome Stable H/H Stable BP   Subjective  - Day of Surgery   Pain better in L leg   Objective   Vitals:   11/16/21 1200 11/16/21 1300 11/16/21 1402 11/16/21 1600  BP: (!) 104/58 (!) 121/104 (!) 138/110 108/64  Pulse: (!) 50 (!) 58 (!) 53 (!) 51  Resp: 17 (!) _0 Temp:    98.5 F (36.9 C)  TempSrc:    Oral  SpO2: 100% 100% 100% 100%  Weight:      Height:         Intake/Output Summary (Last 24 hours) at 11/16/2021 1746 Last data filed at 11/16/2021 1400 Gross per 24 hour  Intake 1080.27 ml  Output 1325 ml  Net -244.73 ml    VASC L lower leg cyanosis resolved, dopplerable signals, soft L calf    Laboratory   CBC    Latest Ref Rng & Units 11/16/2021    1:25 PM 11/16/2021    4:30 AM 01/30/2020   10:51 AM  CBC  WBC 4.0 - 10.5 K/uL 10.0  6.0  5.4   Hemoglobin 12.0 - 15.0 g/dL 10.1  10.7  11.7   Hematocrit 36.0 - 46.0 % 32.7  34.1  36.0   Platelets 150 - 400 K/uL 165  176  228     BMET    Component Value Date/Time   NA 143 11/16/2021 0430   K 3.6 11/16/2021 0430   K 3.8 06/06/2012 1632   CL 106 11/16/2021 0430   CO2 29 11/16/2021 0430   GLUCOSE 122 (H) 11/16/2021 0430   BUN 35 (H) 11/16/2021 0430   CREATININE 1.68 (H) 11/16/2021 0430   CALCIUM 9.4 11/16/2021 0430   GFRNONAA 30 (L) 11/16/2021 0430   GFRAA 37 (L) 01/23/2019 1100     Adele Barthel, MD, FACS, FSVS Covering for [facility]: [phone]  11/16/2021, 5:46 PM

## 2021-11-16 NOTE — ED Provider Notes (Signed)
[facility] Provider Note    Event Date/Time   First MD Initiated Contact with Patient 11/16/21 0430     (approximate)   History   Circulatory Problem (Pt arrived via EMS from home for complaints of LLE pain, discoloration, numbness, cold to touch. Pt denies any previous hx, denies any recent falls/injury. )   HPI  Rebecca Gilmore is a 81 y.o. 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  States she went to bed last night in her normal state of health and woke up at 2:30 AM to go to the bathroom and tried to walk but had severe pain in her left leg and felt like she could not move it normally and it was cold, numb.  She denies any injury to the leg.  No chest pain or shortness of breath.  She is not on any antiplatelets or anticoagulants.  She denies history of peripheral vascular disease or stents.  History of DVT.   History provided by patient.    Past Medical History:  Diagnosis Date   Depression    HBP (high blood pressure)    History of bladder problems    Memory loss    Muscle pain    Osteoporosis    Reflux    Sinus congestion    Swelling    B/L FEET AND LEGS    Past Surgical History:  Procedure Laterality Date   ARTERY BIOPSY Bilateral 12/01/2016   Procedure: BIOPSY TEMPORAL ARTERY;  Surgeon: Conrad [place], MD;  Location: [place];  Service: Vascular;  Laterality: Bilateral;   CATARACT SURGERY     EYELID SURGERY Bilateral    GROWTH ON FACE     KNEE SURGERY Right     MEDICATIONS:  Prior to Admission medications   Medication Sig Start Date End Date Taking? Authorizing Provider  cholecalciferol (VITAMIN D3) 25 MCG (1000 UT) tablet Take 1,000 Units by mouth daily.    [provider]  cimetidine (TAGAMET) 200 MG tablet Take 200 mg by mouth 2 (two)  times daily as needed (for acid reflux/indigestion.).    [provider]  diclofenac Sodium (VOLTAREN) 1 % GEL APPLY 4 GRAMS UP TO 4 TIMES A DAY TO KNEE OR ANKLE JOINT AS NEEDED 11/29/19   [provider]  fluocinonide gel (LIDEX) 0.05 % APPLY GEL TO ULCERS OR AFFECTED AREA 3 TIMES DAILY AS NEEDED DURING FLARES Patient not taking: Reported on 02/06/2020 05/02/15   [provider]  furosemide (LASIX) 40 MG tablet Take 40-80 mg by mouth daily as needed. For fluid retention (swollen ankles) 10/14/16   [provider]  loratadine (CLARITIN) 10 MG tablet Take by mouth.    [provider]  predniSONE (DELTASONE) 20 MG tablet Take 2.5 mg by mouth every other day.  11/23/16   [provider]  pregabalin (LYRICA) 75 MG capsule Take 1 capsule (75 mg total) by mouth 2 (two) times daily. 12/27/18   Edrick Kins, DPM  rosuvastatin (CRESTOR) 5 MG tablet TAKE 1 TABLET BY MOUTH AT BEDTIME FOR HIGH CHOLESTEROL 04/06/19   [provider]  sertraline (ZOLOFT) 50 MG tablet Take 25 mg by mouth daily. 10/14/17   [provider]  telmisartan (MICARDIS) 40 MG tablet Take 40 mg by mouth daily. Patient not taking: Reported on 02/06/2020 09/05/18   [provider]  vitamin B-12 (CYANOCOBALAMIN) 1000 MCG tablet Take 1,000 mcg by mouth daily.     [provider]    Physical Exam   Triage Vital Signs: ED Triage Vitals  Enc Vitals Group     BP 11/16/21 0412 (!) 180/59     Pulse Rate 11/16/21 0412 (!) 59     Resp 11/16/21 0412 18     Temp 11/16/21 0412 98.3 F (36.8 C)     Temp Source 11/16/21 0412 Oral     SpO2 11/16/21 0412 97 %     Weight 11/16/21 0413 237 lb (107.5 kg)     Height 11/16/21 0413 _0  (1.626 m)     Head Circumference --      Peak Flow --      Pain Score 11/16/21 0412 0     Pain Loc --      Pain Edu? --      Excl. in [place]? --     Most recent vital signs: Vitals:   11/16/21 0412  BP: (!) 180/59  Pulse: (!) 59   Resp: 18  Temp: 98.3 F (36.8 C)  SpO2: 97%    CONSTITUTIONAL: Alert and oriented and responds appropriately to questions.  Elderly, chronically ill-appearing, appears uncomfortable HEAD: Normocephalic, atraumatic EYES: Conjunctivae clear, pupils appear equal, sclera nonicteric ENT: normal nose; moist mucous membranes NECK: Supple, normal ROM CARD: RRR; S1 and S2 appreciated; no murmurs, no clicks, no rubs, no gallops RESP: Normal chest excursion without splinting or tachypnea; breath sounds clear and equal bilaterally; no wheezes, no rhonchi, no rales, no hypoxia or respiratory distress, speaking full sentences ABD/GI: Normal bowel sounds; non-distended; soft, non-tender, no rebound, no guarding, no peritoneal signs BACK: The back appears normal EXT: Left leg is mottled in appearance compared to the right and cool to touch.  Skin changes go all the way to the proximal thigh.  I am not able to palpate or Doppler a DP, PT or popliteal pulse.  I think that I may be able to get a faint femoral pulse with Doppler but this may also be venous in nature as it is monophasic and seems slower than her actual heart rate. SKIN: Normal color for age and race; warm; no rash on exposed skin NEURO: Moves all extremities equally, normal speech PSYCH: The patient's mood and manner are appropriate.    Patient gave verbal permission to utilize photo for medical documentation only. The image was not stored on any personal device.   ED Results / Procedures / Treatments   LABS: (all labs ordered are listed, but only abnormal results are displayed) Labs Reviewed  CBC WITH DIFFERENTIAL/PLATELET - Abnormal; Notable for the following components:      Result Value   RBC 3.63 (*)    Hemoglobin 10.7 (*)    HCT 34.1 (*)    All other components within normal limits  COMPREHENSIVE METABOLIC PANEL - Abnormal; Notable for the following components:   Glucose, Bld 122 (*)    BUN 35 (*)    Creatinine, Ser 1.68  (*)    GFR, Estimated 30 (*)    All other components within normal limits  SARS CORONAVIRUS 2 BY RT PCR  PROTIME-INR  APTT  HEPARIN LEVEL (UNFRACTIONATED)  TYPE AND SCREEN  TROPONIN I (HIGH SENSITIVITY)  TROPONIN I (HIGH SENSITIVITY)     EKG:  EKG Interpretation  Date/Time:  Sunday November 16 2021 04:32:51 EDT Ventricular Rate:  57 PR Interval:  255 QRS Duration: 140 QT Interval:  478 QTC Calculation: 466 R Axis:   -38 Text Interpretation: Sinus rhythm Prolonged PR interval Right bundle branch block Confirmed by Pryor Curia [phone]) on 11/16/2021 5:08:11 AM         RADIOLOGY: My personal review and interpretation of imaging: CT shows occlusion of the left femoral artery.  I have personally reviewed all radiology reports.   CT Angio Aortobifemoral W and/or Wo Contrast  Result Date: 11/16/2021 CLINICAL DATA:  Claudication or leg ischemia on the left with leg discoloration. EXAM: CT ANGIOGRAPHY OF ABDOMINAL AORTA WITH ILIOFEMORAL RUNOFF TECHNIQUE: Multidetector CT imaging of the abdomen, pelvis and lower extremities was performed using the standard protocol during bolus administration of intravenous contrast. Multiplanar CT image reconstructions and MIPs were obtained to evaluate the vascular anatomy. RADIATION DOSE REDUCTION: This exam was performed according to the departmental dose-optimization program which includes automated exposure control, adjustment of the mA and/or kV according to patient size and/or use of iterative reconstruction technique. CONTRAST:  126m OMNIPAQUE IOHEXOL 350 MG/ML SOLN COMPARISON:  None Available. FINDINGS: VASCULAR Aorta: Confluent atheromatous plaque. No aneurysm, ulceration, or dissection. Celiac: Separate origin of the common hepatic and splenic arteries. Ostial plaque without flow reducing stenosis. No acute finding SMA: Calcified plaque at the ostium.  No acute finding. Renals: Symmetric renal artery enhancement without detected beading or  aneurysm. IMA: Small vessel that is patent RIGHT Lower Extremity Inflow: Atheromatous plaque without flow reducing stenosis or ulceration. Outflow: Calcified plaque along the posterior wall of the common femoral artery causes 50% stenosis. Runoff: Robust 2 vessel runoff at the ankle. Distally the peroneal artery reconstitutes the posterior tibial artery continuing into the plantar foot. LEFT Lower Extremity Inflow: Atheromatous plaque without flow limiting stenosis, ulceration, or aneurysm. Outflow: Abrupt occlusion of the left common femoral artery where there is calcified plaque. No meaningful flow within left thigh arteries, only tiny muscular branches are faintly enhancing. Runoff: Absent Veins: Not assessed Review of the MIP images confirms the above findings. NON-VASCULAR Lower chest: Small bilateral pleural effusion. Hepatobiliary: Limited by motion artifact but there appears to be surface lobulation and the caudate lobe is large compared to the right lobe.No evidence of biliary obstruction or stone. Pancreas: Generalized atrophy Spleen: Unremarkable. Adrenals/Urinary Tract: Negative adrenals. No hydronephrosis or stone. Unremarkable bladder. Stomach/Bowel: No obstruction. No visible bowel inflammation or ischemia. Lymphatic: No mass or adenopathy. Reproductive:No pathologic findings. Other: No ascites or pneumoperitoneum. Musculoskeletal: Generalized lumbar spine degeneration which is advanced. IMPRESSION: 1. 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 2. Widespread atherosclerosis. 50% narrowing at the right common femoral artery and 2 vessel runoff at the right ankle. 3. Layering pleural effusions that are small. 4. Possible cirrhosis, please correlate for risk factors. Electronically Signed   By: JJorje GuildM.D.   On: 11/16/2021 05:52     PROCEDURES:  Critical Care performed: Yes, see critical care procedure note(s)   CRITICAL CARE Performed by: KCyril MourningWard   Total  critical care time: 45 minutes  Critical care time was exclusive of separately billable procedures and treating other patients.  Critical care was necessary to treat or prevent imminent or life-threatening deterioration.  Critical care was time spent personally by me on the following activities: development of treatment plan with patient and/or surrogate as well as nursing, discussions with consultants, evaluation of patient's response to treatment, examination of patient, obtaining history from patient or surrogate, ordering and performing treatments and interventions, ordering and review of laboratory studies, ordering and review of radiographic studies, pulse  oximetry and re-evaluation of patient's condition.   Marland Kitchen1-3 Lead EKG Interpretation  Performed by: Rosealynn Mateus, Delice Bison, DO Authorized by: Jazmen Lindenbaum, Delice Bison, DO     Interpretation: normal     ECG rate:  59   ECG rate assessment: normal     Rhythm: sinus rhythm     Ectopy: none     Conduction: normal       IMPRESSION / MDM / ASSESSMENT AND PLAN / ED COURSE  I reviewed the triage vital signs and the nursing notes.    Patient here with left lower limb critical ischemia.  The patient is on the cardiac monitor to evaluate for evidence of arrhythmia and/or significant heart rate changes.   DIFFERENTIAL DIAGNOSIS (includes but not limited to):   Ischemic leg.  No sign of cellulitis, septic arthritis, gout, compartment syndrome, cerulea dolens.   Patient's presentation is most consistent with acute presentation with potential threat to life or bodily function.   PLAN: Patient here with ischemic left leg.  Discussed with Dr. Bridgett Larsson on-call for vascular surgery.  Appreciate his help.  He recommends obtaining a CT runoff for further evaluation and he is on his way to the hospital emergently.  We will give heparin.  We will keep n.p.o.  We will give pain medication.   MEDICATIONS GIVEN IN ED: Medications  heparin ADULT infusion 100  units/mL (25000 units/275m) (1,350 Units/hr Intravenous New Bag/Given 11/16/21 0523)  sodium chloride 0.9 % bolus 1,000 mL (has no administration in time range)  fentaNYL (SUBLIMAZE) injection 50 mcg (50 mcg Intravenous Given 11/16/21 0519)  ondansetron (ZOFRAN) injection 4 mg (4 mg Intravenous Given 11/16/21 0519)  iohexol (OMNIPAQUE) 350 MG/ML injection 125 mL (125 mLs Intravenous Contrast Given 11/16/21 0449)  heparin bolus via infusion 5,000 Units (5,000 Units Intravenous Bolus from Bag 11/16/21 0524)     ED COURSE: Patient's labs show mild anemia which is chronic for patient.  Stable chronic kidney disease.  Coags normal.  Troponin negative.  CT scan reviewed and interpreted by myself and the radiologist and shows an acute appearing occlusion of the left common femoral artery.  Patient will go emergently for thrombectomy.  Daughter has been updated at bedside.   CONSULTS: Vascular surgery consulted.   OUTSIDE RECORDS REVIEWED: Reviewed patient's last cardiology note on 10/28/2021.       FINAL CLINICAL IMPRESSION(S) / ED DIAGNOSES   Final diagnoses:  Critical limb ischemia of left lower extremity (HDane     Rx / DC Orders   ED Discharge Orders     None        Note:  This document was prepared using Dragon voice recognition software and may include unintentional dictation errors.   Nhan Qualley, KDelice Bison DO 11/16/21 [phone]

## 2021-11-16 NOTE — Progress Notes (Signed)
[place] for heparin infusion Indication: DVT  Allergies  Allergen Reactions   Kenalog [Triamcinolone Acetonide] Other (See Comments)    FLUSH IN FACE   Penicillins Other (See Comments)    Has patient had a PCN reaction causing immediate rash, facial/tongue/throat swelling, SOB or lightheadedness with hypotension: Unknown Has patient had a PCN reaction causing severe rash involving mucus membranes or skin necrosis: Unknown Has patient had a PCN reaction that required hospitalization:No Has patient had a PCN reaction occurring within the last 10 years: No If all of the above answers are "NO", then may proceed with Cephalosporin use.     Patient Measurements: Height: _0  (162.6 cm) Weight: 111.1 kg (244 lb 14.9 oz) IBW/kg (Calculated) : 54.7 Heparin Dosing Weight: 80.1 kg  Vital Signs: Temp: 98.7 F (37.1 C) (09/10 1937) Temp Source: Oral (09/10 1937) BP: 107/83 (09/10 2100) Pulse Rate: 59 (09/10 2025)  Labs: Recent Labs    11/16/21 0430 11/16/21 0911 11/16/21 1325 11/16/21 1655  HGB 10.7*  --  10.1*  --   HCT 34.1*  --  32.7*  --   PLT 176  --  165  --   APTT 35  --   --   --   LABPROT 13.4  --   --   --   INR 1.0  --   --   --   HEPARINUNFRC  --   --   --  >1.10*  CREATININE 1.68*  --   --   --   TROPONINIHS 17 28*  --   --      Estimated Creatinine Clearance: 32 mL/min (A) (by C-G formula based on SCr of 1.68 mg/dL (H)).   Medical History: Past Medical History:  Diagnosis Date   Depression    HBP (high blood pressure)    History of bladder problems    Memory loss    Muscle pain    Osteoporosis    Reflux    Sinus congestion    Swelling    B/L FEET AND LEGS    Assessment: Pt is a 81 yo female presenting to ED c/o LLE pain, discoloration, numbness, & cold to touch.  Goal of Therapy:  Heparin level 0.3-0.7 units/ml Monitor platelets by anticoagulation protocol: Yes   Plan:  Hold heparin infusion x1 hour  and decrease rate to 1050 units/hr Will check HL in 8 hr after start of infusion CBC daily while on heparin  [place] Pharmacist 11/16/2021 9:11 PM

## 2021-11-16 NOTE — Anesthesia Preprocedure Evaluation (Addendum)
Anesthesia Evaluation  Patient identified by MRN, date of birth, ID band Patient awake    Reviewed: Allergy & Precautions, NPO status , Patient's Chart, lab work & pertinent test results  History of Anesthesia Complications Negative for: history of anesthetic complications  Airway Mallampati: III  TM Distance: <3 FB Neck ROM: full    Dental  (+) Missing   Pulmonary shortness of breath, with exertion and Long-Term Oxygen Therapy, COPD, former smoker,    Pulmonary exam normal        Cardiovascular hypertension, (-) angina+ CAD  Normal cardiovascular exam+ dysrhythmias Atrial Fibrillation + Valvular Problems/Murmurs AS      Neuro/Psych PSYCHIATRIC DISORDERS negative neurological ROS     GI/Hepatic negative GI ROS, Neg liver ROS, neg GERD  ,  Endo/Other  negative endocrine ROS  Renal/GU ARF and CRF     Musculoskeletal   Abdominal   Peds  Hematology negative hematology ROS (+)   Anesthesia Other Findings Past Medical History: No date: Depression No date: HBP (high blood pressure) No date: History of bladder problems No date: Memory loss No date: Muscle pain No date: Osteoporosis No date: Reflux No date: Sinus congestion No date: Swelling     Comment:  B/L FEET AND LEGS  Past Surgical History: 12/01/2016: ARTERY BIOPSY; Bilateral     Comment:  Procedure: BIOPSY TEMPORAL ARTERY;  Surgeon: Conrad [place]oa, MD;  Location: [facility] OR;  Service: Vascular;  Laterality:              Bilateral; No date: CATARACT SURGERY No date: EYELID SURGERY; Bilateral No date: GROWTH ON FACE No date: KNEE SURGERY; Right  BMI    Body Mass Index: 40.68 kg/m      Reproductive/Obstetrics negative OB ROS                          Anesthesia Physical Anesthesia Plan  ASA: 4 and emergent  Anesthesia Plan: General ETT   Post-op Pain Management:    Induction: Intravenous  PONV Risk Score and  Plan: Ondansetron, Dexamethasone, Midazolam and Treatment may vary due to age or medical condition  Airway Management Planned: Oral ETT  Additional Equipment:   Intra-op Plan:   Post-operative Plan: Extubation in OR and Possible Post-op intubation/ventilation  Informed Consent: I have reviewed the patients History and Physical, chart, labs and discussed the procedure including the risks, benefits and alternatives for the proposed anesthesia with the patient or authorized representative who has indicated his/her understanding and acceptance.     Dental Advisory Given  Plan Discussed with: Anesthesiologist, CRNA and Surgeon  Anesthesia Plan Comments: (Patient and daughter consented for risks of anesthesia including but not limited to:  - adverse reactions to medications - damage to eyes, teeth, lips or other oral mucosa - nerve damage due to positioning  - sore throat or hoarseness - Damage to heart, brain, nerves, lungs, other parts of body or loss of life  They voiced understanding.)        Anesthesia Quick Evaluation

## 2021-11-17 ENCOUNTER — Inpatient Hospital Stay: Admit: 2021-11-17 | Discharge: 2021-11-17 | Disposition: A | Discharge status: Home / Self Care | Payer: Medicare Other

## 2021-11-17 ENCOUNTER — Encounter: Payer: Self-pay | Admitting: Vascular Surgery

## 2021-11-17 LAB — BASIC METABOLIC PANEL
Anion gap: 8 (ref 5–15)
BUN: 27 mg/dL — ABNORMAL HIGH (ref 8–23)
CO2: 25 mmol/L (ref 22–32)
Calcium: 8.7 mg/dL — ABNORMAL LOW (ref 8.9–10.3)
Chloride: 108 mmol/L (ref 98–111)
Creatinine, Ser: 1.62 mg/dL — ABNORMAL HIGH (ref 0.44–1.00)
GFR, Estimated: 32 mL/min — ABNORMAL LOW (ref >60)
Glucose, Bld: 104 mg/dL — ABNORMAL HIGH (ref 70–99)
Potassium: 4.5 mmol/L (ref 3.5–5.1)
Sodium: 141 mmol/L (ref 135–145)

## 2021-11-17 LAB — ECHOCARDIOGRAM COMPLETE
AR max vel: 0.98 cm2
AV Area VTI: 1.21 cm2
AV Area mean vel: 1.01 cm2
AV Mean grad: 26.5 mmHg
AV Peak grad: 52.8 mmHg
Ao pk vel: 3.63 m/s
Area-P 1/2: 1.65 cm2
Height: 64 in
MV VTI: 1.33 cm2
S' Lateral: 2.7 cm
Weight: 3918.9 oz

## 2021-11-17 LAB — CBC
HCT: 32.4 % — ABNORMAL LOW (ref 36.0–46.0)
Hemoglobin: 9.9 g/dL — ABNORMAL LOW (ref 12.0–15.0)
MCH: 29.6 pg (ref 26.0–34.0)
MCHC: 30.6 g/dL (ref 30.0–36.0)
MCV: 97 fL (ref 80.0–100.0)
Platelets: 165 10*3/uL (ref 150–400)
RBC: 3.34 MIL/uL — ABNORMAL LOW (ref 3.87–5.11)
RDW: 14 % (ref 11.5–15.5)
WBC: 9.8 10*3/uL (ref 4.0–10.5)
nRBC: 0 % (ref 0.0–0.2)

## 2021-11-17 LAB — HEPARIN LEVEL (UNFRACTIONATED)
Heparin Unfractionated: 0.54 IU/mL (ref 0.30–0.70)
Heparin Unfractionated: 0.27 IU/mL — ABNORMAL LOW (ref 0.30–0.70)

## 2021-11-17 MED ORDER — HEPARIN BOLUS VIA INFUSION
1200.0000 [IU] | Freq: Once | INTRAVENOUS | Status: AC
Start: 2021-11-17 — End: 2021-11-17
  Administered 2021-11-17: 1200 [IU] via INTRAVENOUS
  Filled 2021-11-17: qty 1200

## 2021-11-17 MED ORDER — SODIUM CHLORIDE 0.9 % IV BOLUS
500.0000 mL | Freq: Once | INTRAVENOUS | Status: AC
  Administered 2021-11-17: 500 mL via INTRAVENOUS

## 2021-11-17 NOTE — TOC Initial Note (Signed)
Transition of Care [facility]) - Initial/Assessment Note    Patient Details  Name: Rebecca Gilmore MRN: [id number] Date of Birth: 09-17-1940  [facility] CM/SW Contact:    Shelbie Hutching, RN Phone Number: 11/17/2021, 3:33 PM  Clinical Narrative:                 Patient admitted to the hospital for femoral artery occlusion, left leg s/p thrombectomy.  RNCM met with patient at the bedside.  Patient reports that she is from home with her husband, she is independent and walks with a cane.  She says she has walker and and a rollator but does not like to use either of them, prefers her cane.  Patient does not want to go to SNF but would agree to home health PT and OT.  Referral for [facility] PT and OT given to [facility] with [facility].   TOC continues to follow.  Expected Discharge Plan: [place] Barriers to Discharge: Continued Medical Work up   Patient Goals and CMS Choice Patient states their goals for this hospitalization and ongoing recovery are:: patient wants to go home does not want to go to SNF CMS Medicare.gov Compare Post Acute Care list provided to:: Patient Choice offered to / list presented to : Patient  Expected Discharge Plan and Services Expected Discharge Plan: [place]   Discharge Planning Services: CM Consult Post Acute Care Choice: [place] arrangements for the past 2 months: Single Family Home                 DME Arranged: N/A DME Agency: NA       HH Arranged: PT, OT [place] Agency: [place] ([facility]) Date HH Agency Contacted: 11/17/21 Time [place]: 1532 Representative spoke with at [place]: Corene Cornea  Prior Living Arrangements/Services Living arrangements for the past 2 months: [place] Lives with:: Spouse Patient language and need for interpreter reviewed:: Yes Do you feel safe going back to the place where you live?: Yes      Need for Family Participation in Patient Care: Yes  (Comment) Care giver support system in place?: Yes (comment) Current home services: DME (cane, walker, rollator) Criminal Activity/Legal Involvement Pertinent to Current Situation/Hospitalization: No - Comment as needed  Activities of Daily Living Home Assistive Devices/Equipment: Cane (specify quad or straight) ADL Screening (condition at time of admission) Patient's cognitive ability adequate to safely complete daily activities?: Yes Is the patient deaf or have difficulty hearing?: No Does the patient have difficulty seeing, even when wearing glasses/contacts?: No Does the patient have difficulty concentrating, remembering, or making decisions?: No Patient able to express need for assistance with ADLs?: Yes Does the patient have difficulty dressing or bathing?: No Independently performs ADLs?: Yes (appropriate for developmental age) Does the patient have difficulty walking or climbing stairs?: No Weakness of Legs: Left Weakness of Arms/Hands: None  Permission Sought/Granted Permission sought to share information with : Case Manager, Family Supports, Other (comment) Permission granted to share information with : Yes, Verbal Permission Granted  Share Information with NAME: Derya Dettmann  Permission granted to share info w AGENCY: home health  Permission granted to share info w Relationship: husband  Permission granted to share info w Contact Information: [phone]  Emotional Assessment Appearance:: Appears stated age Attitude/Demeanor/Rapport: Engaged Affect (typically observed): Accepting Orientation: : Oriented to Self, Oriented to Place, Oriented to  Time, Oriented to Situation Alcohol / Substance Use: Not Applicable Psych  Involvement: No (comment)  Admission diagnosis:  Critical limb ischemia of left lower extremity (HCC) [I70.222] Femoral artery occlusion, left [facility]) [I70.202] Patient Active Problem List   Diagnosis Date Noted   Femoral artery occlusion, left ([place])  11/16/2021   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

## 2021-11-17 NOTE — Anesthesia Postprocedure Evaluation (Signed)
Anesthesia Post Note  Patient: Rebecca Gilmore  Procedure(s) Performed: THROMBECTOMY FEMORAL ARTERY (Left)  Patient location during evaluation: ICU Anesthesia Type: General Level of consciousness: patient cooperative and awake and alert Pain management: pain level controlled Vital Signs Assessment: post-procedure vital signs reviewed and stable Respiratory status: spontaneous breathing and nonlabored ventilation Cardiovascular status: stable Postop Assessment: no apparent nausea or vomiting Anesthetic complications: no   No notable events documented.   Last Vitals:  Vitals:   11/16/21 2200 11/17/21 0711  BP: (!) 107/57 (!) 118/44  Pulse: 65 63  Resp: (!) 33 20  Temp:    SpO2: 100% 100%    Last Pain:  Vitals:   11/17/21 0711  TempSrc:   PainSc: 0-No pain                 Lia Foyer

## 2021-11-17 NOTE — Progress Notes (Signed)
[place] for heparin infusion Indication: DVT  Allergies  Allergen Reactions   Kenalog [Triamcinolone Acetonide] Other (See Comments)    FLUSH IN FACE   Penicillins Other (See Comments)    Has patient had a PCN reaction causing immediate rash, facial/tongue/throat swelling, SOB or lightheadedness with hypotension: Unknown Has patient had a PCN reaction causing severe rash involving mucus membranes or skin necrosis: Unknown Has patient had a PCN reaction that required hospitalization:No Has patient had a PCN reaction occurring within the last 10 years: No If all of the above answers are "NO", then may proceed with Cephalosporin use.     Patient Measurements: Height: _0  (162.6 cm) Weight: 111.1 kg (244 lb 14.9 oz) IBW/kg (Calculated) : 54.7 Heparin Dosing Weight: 80.1 kg  Vital Signs: Temp: 98.7 F (37.1 C) (09/10 1937) Temp Source: Oral (09/10 1937) BP: 107/57 (09/10 2200) Pulse Rate: 65 (09/10 2200)  Labs: Recent Labs    11/16/21 0430 11/16/21 0911 11/16/21 1325 11/16/21 1655 11/17/21 0627  HGB 10.7*  --  10.1*  --  9.9*  HCT 34.1*  --  32.7*  --  32.4*  PLT 176  --  165  --  165  APTT 35  --   --   --   --   LABPROT 13.4  --   --   --   --   INR 1.0  --   --   --   --   HEPARINUNFRC  --   --   --  >1.10* 0.54  CREATININE 1.68*  --   --   --  1.62*  TROPONINIHS 17 28*  --   --   --      Estimated Creatinine Clearance: 33.2 mL/min (A) (by C-G formula based on SCr of 1.62 mg/dL (H)).   Medical History: Past Medical History:  Diagnosis Date   Depression    HBP (high blood pressure)    History of bladder problems    Memory loss    Muscle pain    Osteoporosis    Reflux    Sinus congestion    Swelling    B/L FEET AND LEGS    Assessment: Pt is a 81 yo female presenting to ED c/o LLE pain, discoloration, numbness, & cold to touch.  Goal of Therapy:  Heparin level 0.3-0.7 units/ml Monitor platelets by  anticoagulation protocol: Yes   9/11 0627 HL 0.54, therapeutic x 1 Plan:  Continue heparin infusion rate at 1050 units/hr Will check HL in 8 hr to confirm CBC daily while on heparin  Renda Rolls, PharmD, [facility] 11/17/2021 6:54 AM

## 2021-11-17 NOTE — Evaluation (Signed)
Occupational Therapy Evaluation Patient Details Name: Rebecca Gilmore MRN: [id number] DOB: 03-15-1940 Today's Date: 11/17/2021   History of Present Illness Rebecca Gilmore is a 81 y.o. 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 Now s/p L iliofemoral TE w/ patch angioplasty on 11/16/21.   Clinical Impression   Rebecca Gilmore was seen for OT evaluation this date. Prior to hospital admission, pt was MOD I for mobility using walking stick. Pt lives with husband in home c 2 STE, grandson available PRN. Pt presents to acute OT demonstrating impaired ADL performance and functional mobility 2/2 decreased activity tolerance and functional strength/ROM/balance deficits. Pt currently requires increased time + rails exit R side of bed, reports L groin pain with mobility. MIN A sit<>stand with and without RW x4 trials - assist for initiation, poor standing tolerance. SBA scooting along EOB and MOD A to return to bed. Pt would benefit from skilled OT to address noted impairments and functional limitations (see below for any additional details). Upon hospital discharge, recommend STR to maximize pt safety and return to PLOF.    Recommendations for follow up therapy are one component of a multi-disciplinary discharge planning process, led by the attending physician.  Recommendations may be updated based on patient status, additional functional criteria and insurance authorization.   Follow Up Recommendations  Skilled nursing-short term rehab (<3 hours/day)    Assistance Recommended at Discharge Frequent or constant Supervision/Assistance  Patient can return home with the following A lot of help with walking and/or transfers;A lot of help with bathing/dressing/bathroom    Functional Status Assessment  Patient has had a recent  decline in their functional status and demonstrates the ability to make significant improvements in function in a reasonable and predictable amount of time.  Equipment Recommendations  None recommended by OT    Recommendations for Other Services       Precautions / Restrictions Precautions Precautions: Fall Restrictions Weight Bearing Restrictions: No      Mobility Bed Mobility Overal bed mobility: Needs Assistance Bed Mobility: Supine to Sit, Sit to Supine     Supine to sit: Min guard, HOB elevated Sit to supine: Mod assist        Transfers Overall transfer level: Needs assistance Equipment used: Rolling walker (2 wheels) Transfers: Sit to/from Stand Sit to Stand: Min assist           General transfer comment: assist for initiation      Balance Overall balance assessment: Needs assistance Sitting-balance support: Bilateral upper extremity supported, Feet supported Sitting balance-Leahy Scale: Fair     Standing balance support: Bilateral upper extremity supported Standing balance-Leahy Scale: Poor                             ADL either performed or assessed with clinical judgement   ADL Overall ADL's : Needs assistance/impaired                                       General ADL Comments: MAX A don B socks seated EOB. MAX A + RW periaccess in standing      Pertinent Vitals/Pain Pain Assessment Pain Assessment: Faces Faces Pain Scale: Hurts even more Pain Location: L groin incision site Pain Descriptors /  Indicators: Burning, Grimacing, Operative site guarding Pain Intervention(s): Limited activity within patient's tolerance, Repositioned     Hand Dominance     Extremity/Trunk Assessment Upper Extremity Assessment Upper Extremity Assessment: Overall WFL for tasks assessed   Lower Extremity Assessment Lower Extremity Assessment: Generalized weakness       Communication Communication Communication: No  difficulties   Cognition Arousal/Alertness: Awake/alert Behavior During Therapy: WFL for tasks assessed/performed Overall Cognitive Status: Within Functional Limits for tasks assessed                                                  Home Living Family/patient expects to be discharged to:: Private residence Living Arrangements: Spouse/significant other Available Help at Discharge: Family;Available 24 hours/day;Available PRN/intermittently (husband, grandson) Type of Home: House Home Access: Stairs to enter [employer] of Steps: 2 Entrance Stairs-Rails:  (bar) Home Layout: One level               Home Equipment: Conservation officer, nature (2 wheels);Rollator (4 wheels);BSC/3in1 (equipment in attic)          Prior Functioning/Environment Prior Level of Function : Independent/Modified Independent             Mobility Comments: uses walking stick ADLs Comments: completes IADLs for husband except walking the dog        OT Problem List: Decreased strength;Decreased range of motion;Decreased activity tolerance;Impaired balance (sitting and/or standing);Decreased safety awareness      OT Treatment/Interventions: Self-care/ADL training;Therapeutic exercise;Energy conservation;DME and/or AE instruction;Therapeutic activities;Patient/family education;Balance training    OT Goals(Current goals can be found in the care plan section) Acute Rehab OT Goals Patient Stated Goal: to go home OT Goal Formulation: With patient Time For Goal Achievement: 12/01/21 Potential to Achieve Goals: Good ADL Goals Pt Will Perform Grooming: with modified independence;standing Pt Will Perform Lower Body Dressing: with min assist;with caregiver independent in assisting;sit to/from stand Pt Will Transfer to Toilet: with modified independence;ambulating;regular height toilet  OT Frequency: Min 2X/week    Co-evaluation              AM-PAC OT "6 Clicks" Daily Activity      Outcome Measure Help from another person eating meals?: None Help from another person taking care of personal grooming?: A Little Help from another person toileting, which includes using toliet, bedpan, or urinal?: A Lot Help from another person bathing (including washing, rinsing, drying)?: A Lot Help from another person to put on and taking off regular upper body clothing?: A Little Help from another person to put on and taking off regular lower body clothing?: A Lot 6 Click Score: 16   End of Session Equipment Utilized During Treatment: Rolling walker (2 wheels) Nurse Communication: Mobility status  Activity Tolerance: Patient tolerated treatment well Patient left: in bed;with call bell/phone within reach  OT Visit Diagnosis: Muscle weakness (generalized) (M62.81)                Time: 1470-9295 OT Time Calculation (min): 27 min Charges:  OT General Charges $OT Visit: 1 Visit OT Evaluation $OT Eval Moderate Complexity: 1 Mod OT Treatments $Self Care/Home Management : 8-22 mins  Dessie Coma, M.S. OTR/L  11/17/21, 10:39 AM  ascom [phone]

## 2021-11-17 NOTE — Evaluation (Signed)
Physical Therapy Evaluation Patient Details Name: Rebecca Gilmore MRN: [id number] DOB: 07-06-1940 Today's Date: 11/17/2021  History of Present Illness  Rebecca Gilmore is a 81 y.o. 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 Now s/p L iliofemoral TE w/ patch angioplasty on 11/16/21.  Clinical Impression  Pt presents to PT in bed and agreeable to participate in therapy services. Pt was pleasant and motivated to participate during the session and put forth good effort throughout. Pt requires some physical assistance to perform all aspects of functional mobility. Primarily limited by pain with movement at surgical site at this time, as well as functional weakness. Pt fatigues quickly w activity, displaying decr activity tolerance. Would benefit from skilled PT to address above deficits in strength, functional mobility, activity tolerance, and balance at SNF and promote optimal return to PLOF.         Recommendations for follow up therapy are one component of a multi-disciplinary discharge planning process, led by the attending physician.  Recommendations may be updated based on patient status, additional functional criteria and insurance authorization.  Follow Up Recommendations Skilled nursing-short term rehab (<3 hours/day) Can patient physically be transported by private vehicle: No    Assistance Recommended at Discharge Frequent or constant Supervision/Assistance  Patient can return home with the following  A little help with walking and/or transfers;Assistance with cooking/housework;Assist for transportation;Help with stairs or ramp for entrance;A little help with bathing/dressing/bathroom    Equipment Recommendations None recommended by PT  Recommendations for Other Services       Functional Status  Assessment Patient has had a recent decline in their functional status and demonstrates the ability to make significant improvements in function in a reasonable and predictable amount of time.     Precautions / Restrictions Precautions Precautions: Fall Restrictions Weight Bearing Restrictions: No      Mobility  Bed Mobility Overal bed mobility: Needs Assistance Bed Mobility: Supine to Sit     Supine to sit: Min guard, HOB elevated Sit to supine: Mod assist        Transfers Overall transfer level: Needs assistance Equipment used: Rolling walker (2 wheels) Transfers: Sit to/from Stand Sit to Stand: Min assist           General transfer comment: assist for initiation    Ambulation/Gait Ambulation/Gait assistance: Min assist Gait Distance (Feet): 3 Feet Assistive device: Rolling walker (2 wheels) Gait Pattern/deviations: Antalgic, Decreased stance time - left, Decreased step length - right Gait velocity: decr     General Gait Details: slow w incr effort  Stairs            Wheelchair Mobility    Modified Rankin (Stroke Patients Only)       Balance Overall balance assessment: Needs assistance Sitting-balance support: Bilateral upper extremity supported, Feet supported Sitting balance-Leahy Scale: Good     Standing balance support: Bilateral upper extremity supported Standing balance-Leahy Scale: Fair                               Pertinent Vitals/Pain Pain Assessment Pain Assessment: No/denies pain Pain Location: L groin incision site Pain Descriptors / Indicators: Burning, Grimacing, Operative site guarding Pain Intervention(s): Limited activity within patient's tolerance, Monitored during session, Repositioned    Home Living Family/patient expects to be discharged to:: Private residence Living  Arrangements: Spouse/significant other Available Help at Discharge: Family;Available 24 hours/day;Available PRN/intermittently Type of  Home: House Home Access: Stairs to enter Entrance Stairs-Rails:  (bar) Entrance Stairs-Number of Steps: 2   Home Layout: One level Home Equipment: Conservation officer, nature (2 wheels);Rollator (4 wheels);BSC/3in1;Cane - single point      Prior Function Prior Level of Function : Independent/Modified Independent             Mobility Comments: full community amb w AD, uses SPC at baseline ADLs Comments: completes IADLs for husband except walking the dog Husband unable to provide significant physical assistance    Hand Dominance        Extremity/Trunk Assessment   Upper Extremity Assessment Upper Extremity Assessment: Generalized weakness    Lower Extremity Assessment Lower Extremity Assessment: Generalized weakness    Cervical / Trunk Assessment Cervical / Trunk Assessment: Kyphotic  Communication   Communication: No difficulties  Cognition Arousal/Alertness: Awake/alert Behavior During Therapy: WFL for tasks assessed/performed Overall Cognitive Status: Within Functional Limits for tasks assessed                                          General Comments      Exercises Total Joint Exercises Ankle Circles/Pumps: AROM, Both, 20 reps Quad Sets: Strengthening, Both, 10 reps   Assessment/Plan    PT Assessment Patient needs continued PT services  PT Problem List Decreased strength;Decreased activity tolerance;Decreased balance;Decreased mobility;Cardiopulmonary status limiting activity       PT Treatment Interventions DME instruction;Therapeutic activities;Gait training;Therapeutic exercise;Patient/family education;Stair training;Balance training;Functional mobility training;Neuromuscular re-education    PT Goals (Current goals can be found in the Care Plan section)  Acute Rehab PT Goals Patient Stated Goal: to go home PT Goal Formulation: With patient Time For Goal Achievement: 11/30/21 Potential to Achieve Goals: Good    Frequency Min 2X/week      Co-evaluation               AM-PAC PT "6 Clicks" Mobility  Outcome Measure Help needed turning from your back to your side while in a flat bed without using bedrails?: A Little Help needed moving from lying on your back to sitting on the side of a flat bed without using bedrails?: A Little Help needed moving to and from a bed to a chair (including a wheelchair)?: A Little Help needed standing up from a chair using your arms (e.g., wheelchair or bedside chair)?: A Little Help needed to walk in hospital room?: A Lot Help needed climbing 3-5 steps with a railing? : A Lot 6 Click Score: 16    End of Session Equipment Utilized During Treatment: Gait belt;Oxygen Activity Tolerance: Patient tolerated treatment well Patient left: in bed;with call bell/phone within reach Nurse Communication: Mobility status PT Visit Diagnosis: Unsteadiness on feet (R26.81);Muscle weakness (generalized) (M62.81)    Time: 1005-1050 PT Time Calculation (min) (ACUTE ONLY): 45 min   Charges:             Glenice Laine MPH, SPT 11/17/21, 1:38 PM

## 2021-11-17 NOTE — Progress Notes (Signed)
ANTICOAGULATION CONSULT NOTE  Pharmacy Consult for Heparin Infusion Indication: DVT  Patient Measurements: Height: _0  (162.6 cm) Weight: 111.1 kg (244 lb 14.9 oz) IBW/kg (Calculated) : 54.7 Heparin Dosing Weight: 80.1 kg  Labs: Recent Labs    11/16/21 0430 11/16/21 0911 11/16/21 1325 11/16/21 1655 11/17/21 0627 11/17/21 1434  HGB 10.7*  --  10.1*  --  9.9*  --   HCT 34.1*  --  32.7*  --  32.4*  --   PLT 176  --  165  --  165  --   APTT 35  --   --   --   --   --   LABPROT 13.4  --   --   --   --   --   INR 1.0  --   --   --   --   --   HEPARINUNFRC  --   --   --  >1.10* 0.54 0.27*  CREATININE 1.68*  --   --   --  1.62*  --   TROPONINIHS 17 28*  --   --   --   --      Estimated Creatinine Clearance: 33.2 mL/min (A) (by C-G formula based on SCr of 1.62 mg/dL (H)).   Medical History: Past Medical History:  Diagnosis Date   Depression    HBP (high blood pressure)    History of bladder problems    Memory loss    Muscle pain    Osteoporosis    Reflux    Sinus congestion    Swelling    B/L FEET AND LEGS    Assessment: Pt is a 81 yo female presenting to ED c/o LLE pain, discoloration, numbness, & cold to touch.  Goal of Therapy:  Heparin level 0.3-0.7 units/ml Monitor platelets by anticoagulation protocol: Yes   9/11 0627 HL 0.54, therapeutic x 1 9/11 1434 HL 0.27, subtherapeutic  Plan:  --Heparin level is subtherapeutic --Heparin 1200 unit IV bolus and increase infusion rate to 1200 units/hr --Re-check HL 8 hours after rate change --Daily CBC per protocol while on IV heparin  Benita Gutter  11/17/2021 3:04 PM

## 2021-11-17 NOTE — Progress Notes (Signed)
An USGPIV (ultrasound guided PIV) 20G x 1.88" on LFA has been placed for short-term vasopressor infusion. A correctly placed ivWatch must be used when administering Vasopressors. Should this treatment be needed beyond 72 hours, central line access should be obtained.  It will be the responsibility of the bedside nurse to follow best practice to prevent extravasations.

## 2021-11-17 NOTE — Progress Notes (Signed)
*  PRELIMINARY RESULTS* Echocardiogram 2D Echocardiogram has been performed.  Sherrie Sport 11/17/2021, 11:59 AM

## 2021-11-17 NOTE — Progress Notes (Signed)
[facility]  Daily Progress Note   Subjective  -   Complains of incisional Gilmore.  Left foot not hurting any more. BP a little low earlier, but responded to NS bolus.    Objective Vitals:   11/17/21 0711 11/17/21 0800 11/17/21 0900 11/17/21 1000  BP: (!) 118/44 (!) 126/49 (!) 140/51 (!) 130/46  Pulse: 63 63 66 64  Resp: 20 (!) 23 17 (!) 28  Temp:  98.9 F (37.2 C)    TempSrc:      SpO2: 100% 99% 97% 98%  Weight:      Height:        Intake/Output Summary (Last 24 hours) at 11/17/2021 1049 Last data filed at 11/17/2021 0500 Gross per 24 hour  Intake 2026.78 ml  Output 1175 ml  Net 851.78 ml    PULM  CTAB CV  RRR VASC  Drain with serosanguinous fluid. Dressing C/D/I. Palpable pedal pulses bilaterally.  Laboratory CBC    Component Value Date/Time   WBC 9.8 11/17/2021 0627   HGB 9.9 (L) 11/17/2021 0627   HCT 32.4 (L) 11/17/2021 0627   PLT 165 11/17/2021 0627    BMET    Component Value Date/Time   NA 141 11/17/2021 0627   K 4.5 11/17/2021 0627   K 3.8 06/06/2012 1632   CL 108 11/17/2021 0627   CO2 25 11/17/2021 0627   GLUCOSE 104 (H) 11/17/2021 0627   BUN 27 (H) 11/17/2021 0627   CREATININE 1.62 (H) 11/17/2021 0627   CALCIUM 8.7 (L) 11/17/2021 0627   GFRNONAA 32 (L) 11/17/2021 0627   GFRAA 37 (L) 01/23/2019 1100    Assessment/Planning: POD #1 s/p left femoral thromboembolectomy for ischemic leg  Doing well Drain output still a little high.  Will remove tomorrow Can be stepdown status Increase activity   Rebecca Gilmore  11/17/2021, 10:49 AM

## 2021-11-18 LAB — HEPARIN LEVEL (UNFRACTIONATED)
Heparin Unfractionated: 0.26 IU/mL — ABNORMAL LOW (ref 0.30–0.70)
Heparin Unfractionated: 0.37 IU/mL (ref 0.30–0.70)
Heparin Unfractionated: 0.41 IU/mL (ref 0.30–0.70)

## 2021-11-18 LAB — CBC
HCT: 30.7 % — ABNORMAL LOW (ref 36.0–46.0)
Hemoglobin: 9.4 g/dL — ABNORMAL LOW (ref 12.0–15.0)
MCH: 29.4 pg (ref 26.0–34.0)
MCHC: 30.6 g/dL (ref 30.0–36.0)
MCV: 95.9 fL (ref 80.0–100.0)
Platelets: 146 10*3/uL — ABNORMAL LOW (ref 150–400)
RBC: 3.2 MIL/uL — ABNORMAL LOW (ref 3.87–5.11)
RDW: 14 % (ref 11.5–15.5)
WBC: 8.1 10*3/uL (ref 4.0–10.5)
nRBC: 0 % (ref 0.0–0.2)

## 2021-11-18 LAB — SURGICAL PATHOLOGY

## 2021-11-18 MED ORDER — HEPARIN BOLUS VIA INFUSION
1200.0000 [IU] | Freq: Once | INTRAVENOUS | Status: AC
  Administered 2021-11-18: 1200 [IU] via INTRAVENOUS
  Filled 2021-11-18: qty 1200

## 2021-11-18 NOTE — Progress Notes (Signed)
Occupational Therapy Treatment Patient Details Name: Rebecca Gilmore MRN: [id number] DOB: [birth year] Today's Date: 11/18/2021   History of present illness Rebecca Gilmore is a 81 y.o. 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 Now s/p L iliofemoral TE w/ patch angioplasty on 11/16/21.   OT comments  Pt seen for OT tx. Pt reports LLE groin pain and RN notified. Pt had difficulty with 10-point pain scale, able to endorse "small" amount of pain when presented with 3 choices, that worsens with AAROM attempts. Pt alert and oriented, however, somewhat confused and demo's decreased STM and problem solving. Pt completed grooming tasks with set up in bed. Pt instructed in dynamic reaching activity to strengthen trunk rotation and BUE completed x10, more difficult to L side 2/2 L groin pain. Pt completed AAROM ROM BLE knee flexion and hip flexion x5 each, limited by pain on LLE, limited by decreased ROM on RLE (pt endorses multiple miniscus tears). Pt/family educated in pain mgt and encouraged her to complete active pain assessments attempting to move her LLE and then notify nursing so they can properly address. Pt/dtr verbalized understanding. Pt continues to benefit from skilled OT services. Continue to recommend SNF at this time.    Recommendations for follow up therapy are one component of a multi-disciplinary discharge planning process, led by the attending physician.  Recommendations may be updated based on patient status, additional functional criteria and insurance authorization.    Follow Up Recommendations  Skilled nursing-short term rehab (<3 hours/day)    Assistance Recommended at Discharge Frequent or constant Supervision/Assistance  Patient can return home with the following  A lot of help with walking and/or  transfers;A lot of help with bathing/dressing/bathroom   Equipment Recommendations  None recommended by OT    Recommendations for Other Services      Precautions / Restrictions Precautions Precautions: Fall Restrictions Weight Bearing Restrictions: No       Mobility Bed Mobility               General bed mobility comments: pt declined 2/2 pain    Transfers                         Balance                                           ADL either performed or assessed with clinical judgement   ADL                                         General ADL Comments: Pt set up to complete grooming tasks, declined EOB ADL    Extremity/Trunk Assessment              Vision       Perception     Praxis      Cognition Arousal/Alertness: Awake/alert Behavior During Therapy: WFL for tasks assessed/performed Overall Cognitive Status: Within Functional Limits for tasks assessed  General Comments: alert and oriented, follows commands with VC, but demo's slightly decreased problem solving and decreased STM        Exercises Other Exercises Other Exercises: Pt instructed in dynamic reaching activity to strengthen trunk rotation and BUE completed x10, more difficult to L side 2/2 L groin pain Other Exercises: Pt completed AAROM ROM BLE knee flexion and hip flexion x5 each, limited by pain on LLE, limited by decreased ROM on RLE (pt endorses multiple miniscus tears)    Shoulder Instructions       General Comments      Pertinent Vitals/ Pain       Pain Assessment Pain Assessment: Faces Faces Pain Scale: Hurts even more Pain Location: L groin incision site Pain Descriptors / Indicators: Burning, Grimacing Pain Intervention(s): Limited activity within patient's tolerance, Monitored during session, Patient requesting pain meds-RN notified, Repositioned  Home Living                                           Prior Functioning/Environment              Frequency  Min 2X/week        Progress Toward Goals  OT Goals(current goals can now be found in the care plan section)  Progress towards OT goals: Progressing toward goals  Acute Rehab OT Goals Patient Stated Goal: to go home OT Goal Formulation: With patient Time For Goal Achievement: 12/01/21 Potential to Achieve Goals: Good  Plan Discharge plan remains appropriate;Frequency remains appropriate    Co-evaluation                 AM-PAC OT "6 Clicks" Daily Activity     Outcome Measure   Help from another person eating meals?: None Help from another person taking care of personal grooming?: A Little Help from another person toileting, which includes using toliet, bedpan, or urinal?: A Lot Help from another person bathing (including washing, rinsing, drying)?: A Lot Help from another person to put on and taking off regular upper body clothing?: A Little Help from another person to put on and taking off regular lower body clothing?: A Lot 6 Click Score: 16    End of Session    OT Visit Diagnosis: Muscle weakness (generalized) (M62.81)   Activity Tolerance Patient limited by pain   Patient Left in bed;with call bell/phone within reach;with bed alarm set;with nursing/sitter in room;with family/visitor present   Nurse Communication Patient requests pain meds        Time: 1456-1520 OT Time Calculation (min): 24 min  Charges: OT General Charges $OT Visit: 1 Visit OT Treatments $Self Care/Home Management : 8-22 mins $Therapeutic Exercise: 8-22 mins  Ardeth Perfect., MPH, MS, OTR/L ascom [phone] 11/18/21, 3:28 PM

## 2021-11-18 NOTE — Progress Notes (Signed)
ANTICOAGULATION CONSULT NOTE  Pharmacy Consult for Heparin Infusion Indication: DVT  Patient Measurements: Height: _0  (162.6 cm) Weight: 111.1 kg (244 lb 14.9 oz) IBW/kg (Calculated) : 54.7 Heparin Dosing Weight: 80.1 kg  Labs: Recent Labs    11/16/21 0430 11/16/21 0911 11/16/21 1325 11/16/21 1655 11/17/21 0627 11/17/21 1434 11/17/21 2358  HGB 10.7*  --  10.1*  --  9.9*  --   --   HCT 34.1*  --  32.7*  --  32.4*  --   --   PLT 176  --  165  --  165  --   --   APTT 35  --   --   --   --   --   --   LABPROT 13.4  --   --   --   --   --   --   INR 1.0  --   --   --   --   --   --   HEPARINUNFRC  --   --   --    < > 0.54 0.27* 0.37  CREATININE 1.68*  --   --   --  1.62*  --   --   TROPONINIHS 17 28*  --   --   --   --   --    < > = values in this interval not displayed.     Estimated Creatinine Clearance: 33.2 mL/min (A) (by C-G formula based on SCr of 1.62 mg/dL (H)).   Medical History: Past Medical History:  Diagnosis Date   Depression    HBP (high blood pressure)    History of bladder problems    Memory loss    Muscle pain    Osteoporosis    Reflux    Sinus congestion    Swelling    B/L FEET AND LEGS    Assessment: Pt is a 81 yo female presenting to ED c/o LLE pain, discoloration, numbness, & cold to touch.  Goal of Therapy:  Heparin level 0.3-0.7 units/ml Monitor platelets by anticoagulation protocol: Yes   9/11 0627 HL 0.54, therapeutic x 1 9/11 1434 HL 0.27, subtherapeutic 9/11 2358 HL 0.37, therapeutic X 1  Plan:  9/11:  HL @ 2358 0.37, therapeutic X 1 Will recheck HL in 8 hrs on 9/12 @ 0800.   Tuyet Bader D  11/18/2021 12:32 AM

## 2021-11-18 NOTE — Progress Notes (Signed)
ANTICOAGULATION CONSULT NOTE  Pharmacy Consult for Heparin Infusion Indication: DVT  Patient Measurements: Height: _0  (162.6 cm) Weight: 111.1 kg (244 lb 14.9 oz) IBW/kg (Calculated) : 54.7 Heparin Dosing Weight: 80.1 kg  Labs: Recent Labs    11/16/21 0430 11/16/21 0911 11/16/21 1325 11/16/21 1655 11/17/21 0627 11/17/21 1434 11/17/21 2358 11/18/21 1058  HGB 10.7*  --  10.1*  --  9.9*  --   --  9.4*  HCT 34.1*  --  32.7*  --  32.4*  --   --  30.7*  PLT 176  --  165  --  165  --   --  146*  APTT 35  --   --   --   --   --   --   --   LABPROT 13.4  --   --   --   --   --   --   --   INR 1.0  --   --   --   --   --   --   --   HEPARINUNFRC  --   --   --    < > 0.54 0.27* 0.37 0.26*  CREATININE 1.68*  --   --   --  1.62*  --   --   --   TROPONINIHS 17 28*  --   --   --   --   --   --    < > = values in this interval not displayed.     Estimated Creatinine Clearance: 33.2 mL/min (A) (by C-G formula based on SCr of 1.62 mg/dL (H)).   Medical History: Past Medical History:  Diagnosis Date   Depression    HBP (high blood pressure)    History of bladder problems    Memory loss    Muscle pain    Osteoporosis    Reflux    Sinus congestion    Swelling    B/L FEET AND LEGS    Assessment: Pt is a 81 yo female presenting to ED c/o LLE pain, discoloration, numbness, & cold to touch.  Goal of Therapy:  Heparin level 0.3-0.7 units/ml Monitor platelets by anticoagulation protocol: Yes   9/11 0627 HL 0.54, therapeutic x 1 9/11 1434 HL 0.27, subtherapeutic 9/11 2358 HL 0.37, therapeutic x 1 9/12 1058 HL 0.26, subtherapeutic  Plan: HL subtherapeutic Bolus 1200 units and increase heparin to 1400 units/hr. Will recheck HL in 8 hrs  CBC daily  Glean Salvo, PharmD Clinical Pharmacist  11/18/2021 12:53 PM

## 2021-11-18 NOTE — Progress Notes (Signed)
Physical Therapy Treatment Patient Details Name: Rebecca Gilmore MRN: [id number] DOB: 06/01/1940 Today's Date: 11/18/2021   History of Present Illness Terra Aveni is a 81 y.o. 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 Now s/p L iliofemoral TE w/ patch angioplasty on 11/16/21.    PT Comments    Pt presents to PT in bed and requires encouragement to participate in therapy services.  Pt requires physical assistance for all aspects of functional mobility during today's tx session. Appears confused today, nursing notified. Struggles to answer questions and specify intensity or location of her pain. Would benefit from skilled PT at SNF to address above deficits in strength, functional mobility, balance, and activity tolerance to promote optimal return to PLOF.   Recommendations for follow up therapy are one component of a multi-disciplinary discharge planning process, led by the attending physician.  Recommendations may be updated based on patient status, additional functional criteria and insurance authorization.  Follow Up Recommendations  Skilled nursing-short term rehab (<3 hours/day) Can patient physically be transported by private vehicle: No   Assistance Recommended at Discharge Frequent or constant Supervision/Assistance  Patient can return home with the following A little help with walking and/or transfers;Assistance with cooking/housework;Assist for transportation;Help with stairs or ramp for entrance;A little help with bathing/dressing/bathroom   Equipment Recommendations  None recommended by PT    Recommendations for Other Services       Precautions / Restrictions Precautions Precautions: Fall Restrictions Weight Bearing Restrictions: No     Mobility  Bed Mobility Overal bed mobility:  Needs Assistance Bed Mobility: Supine to Sit     Supine to sit: Mod assist, +2 for physical assistance Sit to supine: Max assist, +2 for physical assistance        Transfers Overall transfer level: Needs assistance Equipment used: Rolling walker (2 wheels) Transfers: Sit to/from Stand Sit to Stand: Max assist                Ambulation/Gait Ambulation/Gait assistance: Max Web designer (Feet): 3 Feet, shufflling Assistive device: Rolling walker (2 wheels) Gait Pattern/deviations: Antalgic, Decreased stance time - left, Decreased step length - right Gait velocity: decr     General Gait Details: slow w incr effort   Stairs             Wheelchair Mobility    Modified Rankin (Stroke Patients Only)       Balance Overall balance assessment: Needs assistance Sitting-balance support: Bilateral upper extremity supported, Feet supported Sitting balance-Leahy Scale: Fair     Standing balance support: Bilateral upper extremity supported, During functional activity, Reliant on assistive device for balance Standing balance-Leahy Scale: Poor                              Cognition Arousal/Alertness: Awake/alert Behavior During Therapy: Restless, Agitated Overall Cognitive Status: Impaired/Different from baseline                                 General Comments: needs significantly extended time to follow instructions and unable to answer questions        Exercises Total Joint Exercises Ankle Circles/Pumps: AROM, Both, 20 reps Quad Sets: Strengthening, Both, 10 reps Glute Sets: Strengthening, Both, 10 reps, Supine  General Comments        Pertinent Vitals/Pain Pain Assessment Pain Assessment: PAINAD Breathing: occasional labored breathing, short period of hyperventilation Negative Vocalization: repeated troubled calling out, loud moaning/groaning, crying Facial Expression: facial grimacing Body Language: tense,  distressed pacing, fidgeting Consolability: distracted or reassured by voice/touch PAINAD Score: 7 Pain Location: unable to state location or intensity of pain Pain Descriptors / Indicators: Burning, Grimacing Pain Intervention(s): Limited activity within patient's tolerance, Monitored during session    Home Living                          Prior Function            PT Goals (current goals can now be found in the care plan section) Acute Rehab PT Goals Patient Stated Goal: to go home PT Goal Formulation: With patient Time For Goal Achievement: 11/30/21 Potential to Achieve Goals: Good Progress towards PT goals: Not progressing toward goals - comment (pt appears to be confused today)    Frequency    Min 2X/week      PT Plan Current plan remains appropriate    Co-evaluation              AM-PAC PT "6 Clicks" Mobility   Outcome Measure  Help needed turning from your back to your side while in a flat bed without using bedrails?: A Lot Help needed moving from lying on your back to sitting on the side of a flat bed without using bedrails?: A Lot Help needed moving to and from a bed to a chair (including a wheelchair)?: A Lot Help needed standing up from a chair using your arms (e.g., wheelchair or bedside chair)?: A Lot Help needed to walk in hospital room?: A Lot Help needed climbing 3-5 steps with a railing? : Total 6 Click Score: 11    End of Session Equipment Utilized During Treatment: Gait belt;Oxygen Activity Tolerance: Patient limited by pain Patient left: in bed;with call bell/phone within reach;with nursing/sitter in room Nurse Communication: Mobility status PT Visit Diagnosis: Unsteadiness on feet (R26.81);Muscle weakness (generalized) (M62.81)     Time: 4497-5300 PT Time Calculation (min) (ACUTE ONLY): 58 min  Charges:                       Glenice Laine MPH, SPT 11/18/21, 4:01 PM

## 2021-11-18 NOTE — Progress Notes (Signed)
ANTICOAGULATION CONSULT NOTE  Pharmacy Consult for Heparin Infusion Indication: DVT  Patient Measurements: Height: 5' 4" (162.6 cm) Weight: 111.1 kg (244 lb 14.9 oz) IBW/kg (Calculated) : 54.7 Heparin Dosing Weight: 80.1 kg  Labs: Recent Labs    11/16/21 0430 11/16/21 0911 11/16/21 1325 11/16/21 1655 11/17/21 0627 11/17/21 1434 11/17/21 2358 11/18/21 1058 11/18/21 2220  HGB 10.7*  --  10.1*  --  9.9*  --   --  9.4*  --   HCT 34.1*  --  32.7*  --  32.4*  --   --  30.7*  --   PLT 176  --  165  --  165  --   --  146*  --   APTT 35  --   --   --   --   --   --   --   --   LABPROT 13.4  --   --   --   --   --   --   --   --   INR 1.0  --   --   --   --   --   --   --   --   HEPARINUNFRC  --   --   --    < > 0.54   < > 0.37 0.26* 0.41  CREATININE 1.68*  --   --   --  1.62*  --   --   --   --   TROPONINIHS 17 28*  --   --   --   --   --   --   --    < > = values in this interval not displayed.     Estimated Creatinine Clearance: 33.2 mL/min (A) (by C-G formula based on SCr of 1.62 mg/dL (H)).   Medical History: Past Medical History:  Diagnosis Date   Depression    HBP (high blood pressure)    History of bladder problems    Memory loss    Muscle pain    Osteoporosis    Reflux    Sinus congestion    Swelling    B/L FEET AND LEGS    Assessment: Pt is a 81 yo female presenting to ED c/o LLE pain, discoloration, numbness, & cold to touch.  Goal of Therapy:  Heparin level 0.3-0.7 units/ml Monitor platelets by anticoagulation protocol: Yes   9/11 0627 HL 0.54, therapeutic x 1 9/11 1434 HL 0.27, subtherapeutic 9/11 2358 HL 0.37, therapeutic x 1 9/12 1058 HL 0.26, subtherapeutic 9/12 2220 HL 0.41, therapeutic X 1    Plan:  9/12 @ 2220 = 0.41, therapeutic X 1  Will continue pt on current rate and recheck HL on 9/13 @ 0600.  Ellia Knowlton D CBC daily   Clinical Pharmacist  11/18/2021 11:01 PM

## 2021-11-18 NOTE — Plan of Care (Signed)
  Problem: Education: Goal: Knowledge of General Education information will improve Description: Including pain rating scale, medication(s)/side effects and non-pharmacologic comfort measures Outcome: Progressing   Problem: Clinical Measurements: Goal: Ability to maintain clinical measurements within normal limits will improve Outcome: Progressing Goal: Will remain free from infection Outcome: Progressing Goal: Diagnostic test results will improve Outcome: Progressing Goal: Respiratory complications will improve Outcome: Progressing Goal: Cardiovascular complication will be avoided Outcome: Progressing   Problem: Coping: Goal: Level of anxiety will decrease Outcome: Progressing   Problem: Pain Managment: Goal: General experience of comfort will improve Outcome: Progressing   Problem: Safety: Goal: Ability to remain free from injury will improve Outcome: Progressing   Problem: Skin Integrity: Goal: Risk for impaired skin integrity will decrease Outcome: Progressing   Problem: Education: Goal: Knowledge of prescribed regimen will improve Outcome: Progressing   Problem: Activity: Goal: Ability to tolerate increased activity will improve Outcome: Progressing   Problem: Bowel/Gastric: Goal: Gastrointestinal status for postoperative course will improve Outcome: Progressing   Problem: Clinical Measurements: Goal: Postoperative complications will be avoided or minimized Outcome: Progressing Goal: Signs and symptoms of graft occlusion will improve Outcome: Progressing   Problem: Skin Integrity: Goal: Demonstration of wound healing without infection will improve Outcome: Progressing   Problem: Health Behavior/Discharge Planning: Goal: Ability to manage health-related needs will improve Outcome: Not Progressing   Problem: Activity: Goal: Risk for activity intolerance will decrease Outcome: Not Progressing   Problem: Nutrition: Goal: Adequate nutrition will be  maintained Outcome: Not Progressing   Problem: Elimination: Goal: Will not experience complications related to bowel motility Outcome: Not Progressing Goal: Will not experience complications related to urinary retention Outcome: Not Progressing

## 2021-11-18 NOTE — Progress Notes (Addendum)
[facility]  Daily Progress Note   Subjective  -   Continues to have incisional pain.  Alert and oriented but seems slightly confused  Objective Vitals:   11/18/21 1600 11/18/21 1700 11/18/21 1705 11/18/21 1745  BP: (!) 137/55     Pulse: 61 66 63 62  Resp: _0 Temp:   98.8 F (37.1 C) 98.6 F (37 C)  TempSrc:   Oral Oral  SpO2: 100% 99% 93% 95%  Weight:      Height:        Intake/Output Summary (Last 24 hours) at 11/18/2021 2052 Last data filed at 11/18/2021 1705 Gross per 24 hour  Intake 1558.73 ml  Output 1020 ml  Net 538.73 ml    PULM  CTAB CV  RRR VASC  warm left lower extremity.  Palpable pedal pulses bilaterally.  Serosanguineous JP drain  Laboratory CBC    Component Value Date/Time   WBC 8.1 11/18/2021 1058   HGB 9.4 (L) 11/18/2021 1058   HCT 30.7 (L) 11/18/2021 1058   PLT 146 (L) 11/18/2021 1058    BMET    Component Value Date/Time   NA 141 11/17/2021 0627   K 4.5 11/17/2021 0627   K 3.8 06/06/2012 1632   CL 108 11/17/2021 0627   CO2 25 11/17/2021 0627   GLUCOSE 104 (H) 11/17/2021 0627   BUN 27 (H) 11/17/2021 0627   CREATININE 1.62 (H) 11/17/2021 0627   CALCIUM 8.7 (L) 11/17/2021 0627   GFRNONAA 32 (L) 11/17/2021 0627   GFRAA 37 (L) 01/23/2019 1100    Assessment/Planning: POD #2 s/p left femoral thromboembolectomy for ischemic leg    Postoperative dressings changed JP drain removed and Prevena VAC applied Patient answers questions appropriately but does exhibit some confusion.  May be related to being in ICU.  We will transfer the floor to see if this helps mentation.  Kris Hartmann  11/18/2021, 8:52 PM

## 2021-11-19 ENCOUNTER — Other Ambulatory Visit (INDEPENDENT_AMBULATORY_CARE_PROVIDER_SITE_OTHER): Payer: Self-pay | Admitting: Nurse Practitioner

## 2021-11-19 ENCOUNTER — Inpatient Hospital Stay: Payer: Medicare Other

## 2021-11-19 LAB — CBC
HCT: 26.5 % — ABNORMAL LOW (ref 36.0–46.0)
Hemoglobin: 8.1 g/dL — ABNORMAL LOW (ref 12.0–15.0)
MCH: 29.1 pg (ref 26.0–34.0)
MCHC: 30.6 g/dL (ref 30.0–36.0)
MCV: 95.3 fL (ref 80.0–100.0)
Platelets: 160 10*3/uL (ref 150–400)
RBC: 2.78 MIL/uL — ABNORMAL LOW (ref 3.87–5.11)
RDW: 13.6 % (ref 11.5–15.5)
WBC: 6.4 10*3/uL (ref 4.0–10.5)
nRBC: 0 % (ref 0.0–0.2)

## 2021-11-19 LAB — BASIC METABOLIC PANEL
Anion gap: 4 — ABNORMAL LOW (ref 5–15)
BUN: 22 mg/dL (ref 8–23)
CO2: 24 mmol/L (ref 22–32)
Calcium: 8.6 mg/dL — ABNORMAL LOW (ref 8.9–10.3)
Chloride: 110 mmol/L (ref 98–111)
Creatinine, Ser: 1.41 mg/dL — ABNORMAL HIGH (ref 0.44–1.00)
GFR, Estimated: 37 mL/min — ABNORMAL LOW (ref >60)
Glucose, Bld: 103 mg/dL — ABNORMAL HIGH (ref 70–99)
Potassium: 4.4 mmol/L (ref 3.5–5.1)
Sodium: 138 mmol/L (ref 135–145)

## 2021-11-19 LAB — HEPARIN LEVEL (UNFRACTIONATED)
Heparin Unfractionated: 0.26 IU/mL — ABNORMAL LOW (ref 0.30–0.70)
Heparin Unfractionated: 0.43 IU/mL (ref 0.30–0.70)

## 2021-11-19 MED ORDER — HEPARIN BOLUS VIA INFUSION
2100.0000 [IU] | Freq: Once | INTRAVENOUS | Status: AC
  Administered 2021-11-19: 2100 [IU] via INTRAVENOUS
  Filled 2021-11-19: qty 2100

## 2021-11-19 MED ORDER — AQUAPHOR EX OINT
TOPICAL_OINTMENT | Freq: Every day | CUTANEOUS | Status: DC | PRN
  Filled 2021-11-19: qty 50

## 2021-11-19 MED ORDER — LORATADINE 10 MG PO TABS
10.0000 mg | ORAL_TABLET | Freq: Every day | ORAL | Status: DC
  Administered 2021-11-19 – 2021-11-25 (×7): 10 mg via ORAL
  Filled 2021-11-19 (×7): qty 1

## 2021-11-19 NOTE — Plan of Care (Signed)

## 2021-11-19 NOTE — Progress Notes (Signed)
Occupational Therapy Treatment Patient Details Name: Rebecca Gilmore MRN: [id number] DOB: [birth year] Today's Date: 11/19/2021   History of present illness Rebecca Gilmore is a 81 y.o. 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 Now s/p L iliofemoral TE w/ patch angioplasty on 11/16/21.   OT comments  Upon entering the room, pt supine in bed and family present in room. Pt is agreeable to OT intervention with family encouragement. Pt needing mod A +2 for supine >sit. Sitting balance with min A and pt leaning towards the R side trying to take weight off of L for pain management. Pt needing max encouragement to stand x 2 reps. First attempt is without use of AD and second is with RW both requiring max A of 2 to stand from standard bed height. On second attempt, pt does stand to hug friend in room and takes 1 small lateral step to the L before sitting down. Pt is very limited functionally this session. Once she is seated, pt then closes eyes and needs additional cuing for positioning. Pt reporting she wants to go home and has limited insight/awareness to deficits at this time. Recommendation remains for short term rehab to address functional deficits.     Recommendations for follow up therapy are one component of a multi-disciplinary discharge planning process, led by the attending physician.  Recommendations may be updated based on patient status, additional functional criteria and insurance authorization.    Follow Up Recommendations  Skilled nursing-short term rehab (<3 hours/day)    Assistance Recommended at Discharge Frequent or constant Supervision/Assistance  Patient can return home with the following  Two people to help with walking and/or transfers;Two people to help with bathing/dressing/bathroom;Help with  stairs or ramp for entrance;Assist for transportation;Direct supervision/assist for financial management;Direct supervision/assist for medications management   Equipment Recommendations  Other (comment) (defer to next venue of care)       Precautions / Restrictions Precautions Precautions: Fall Restrictions Weight Bearing Restrictions: No       Mobility Bed Mobility Overal bed mobility: Needs Assistance Bed Mobility: Supine to Sit, Sit to Supine     Supine to sit: Mod assist, +2 for physical assistance Sit to supine: Max assist, +2 for physical assistance        Transfers Overall transfer level: Needs assistance Equipment used: Rolling walker (2 wheels) Transfers: Sit to/from Stand Sit to Stand: Max assist, +2 physical assistance                 Balance Overall balance assessment: Needs assistance Sitting-balance support: Bilateral upper extremity supported, Feet supported Sitting balance-Leahy Scale: Fair     Standing balance support: Bilateral upper extremity supported, During functional activity, Reliant on assistive device for balance Standing balance-Leahy Scale: Zero                             ADL either performed or assessed with clinical judgement   ADL                                         General ADL Comments: anticipate total A from bed level for LB self care    Extremity/Trunk Assessment Upper Extremity Assessment Upper Extremity Assessment: Generalized weakness  Lower Extremity Assessment Lower Extremity Assessment: Generalized weakness        Vision Patient Visual Report: No change from baseline            Cognition Arousal/Alertness: Awake/alert Behavior During Therapy: Anxious Overall Cognitive Status: Impaired/Different from baseline                                 General Comments: increased time to process and follow directions to initiate.                   Pertinent  Vitals/ Pain       Pain Assessment Pain Assessment: Faces Faces Pain Scale: Hurts even more Pain Location: unable to verbalized Pain Descriptors / Indicators: Grimacing, Discomfort Pain Intervention(s): Limited activity within patient's tolerance, Monitored during session, Repositioned         Frequency  Min 2X/week        Progress Toward Goals  OT Goals(current goals can now be found in the care plan section)  Progress towards OT goals: Progressing toward goals  Acute Rehab OT Goals Patient Stated Goal: to go home OT Goal Formulation: With patient Time For Goal Achievement: 12/01/21 Potential to Achieve Goals: Good  Plan Discharge plan remains appropriate;Frequency remains appropriate       AM-PAC OT "6 Clicks" Daily Activity     Outcome Measure   Help from another person eating meals?: None Help from another person taking care of personal grooming?: A Little Help from another person toileting, which includes using toliet, bedpan, or urinal?: A Lot Help from another person bathing (including washing, rinsing, drying)?: A Lot Help from another person to put on and taking off regular upper body clothing?: A Little Help from another person to put on and taking off regular lower body clothing?: A Lot 6 Click Score: 16    End of Session Equipment Utilized During Treatment: Rolling walker (2 wheels)  OT Visit Diagnosis: Muscle weakness (generalized) (M62.81)   Activity Tolerance Patient limited by pain;Other (comment) (self limiting)   Patient Left in bed;with call bell/phone within reach;with bed alarm set;with family/visitor present   Nurse Communication Mobility status        Time: 3794-4461 OT Time Calculation (min): 46 min  Charges: OT General Charges $OT Visit: 1 Visit OT Treatments $Therapeutic Activity: 38-52 mins  Darleen Crocker, MS, OTR/L , CBIS ascom [phone]  11/19/21, 3:23 PM

## 2021-11-19 NOTE — Progress Notes (Signed)
ANTICOAGULATION CONSULT NOTE  Pharmacy Consult for Heparin Infusion Indication: DVT  Patient Measurements: Height: 5' 4" (162.6 cm) Weight: 111.1 kg (244 lb 14.9 oz) IBW/kg (Calculated) : 54.7 Heparin Dosing Weight: 80.1 kg  Labs: Recent Labs     0000 11/16/21 0911 11/16/21 1325 11/16/21 1655 11/17/21 0627 11/17/21 1434 11/18/21 1058 11/18/21 2220 11/19/21 0609  HGB   < >  --  10.1*  --  9.9*  --  9.4*  --   --   HCT  --   --  32.7*  --  32.4*  --  30.7*  --   --   PLT  --   --  165  --  165  --  146*  --   --   HEPARINUNFRC  --   --   --    < > 0.54   < > 0.26* 0.41 0.26*  CREATININE  --   --   --   --  1.62*  --   --   --   --   TROPONINIHS  --  28*  --   --   --   --   --   --   --    < > = values in this interval not displayed.     Estimated Creatinine Clearance: 33.2 mL/min (A) (by C-G formula based on SCr of 1.62 mg/dL (H)).   Medical History: Past Medical History:  Diagnosis Date   Depression    HBP (high blood pressure)    History of bladder problems    Memory loss    Muscle pain    Osteoporosis    Reflux    Sinus congestion    Swelling    B/L FEET AND LEGS    Assessment: Pt is a 81 yo female presenting to ED c/o LLE pain, discoloration, numbness, & cold to touch.  Goal of Therapy:  Heparin level 0.3-0.7 units/ml Monitor platelets by anticoagulation protocol: Yes   9/11 0627 HL 0.54, therapeutic x 1 9/11 1434 HL 0.27, subtherapeutic 9/11 2358 HL 0.37, therapeutic x 1 9/12 1058 HL 0.26, subtherapeutic 9/12 2220 HL 0.41, therapeutic X 1  9/13 0609 HL 0.26, SUBtherapeutic X 1   Plan:   9/13:  HL @ 0609 = 0.26, SUBtherapeutic X 1 Will order heparin 1200 units IV X 1 bolus and increase drip rate to 1550 units/hr.  Will recheck HL 8 hrs after rate change.   Shirlena Brinegar D CBC daily   Clinical Pharmacist  11/19/2021 7:13 AM

## 2021-11-19 NOTE — Progress Notes (Signed)
[facility]  Daily Progress Note   Subjective  -   Patient is acutely confused, un-oriented to place, time or situation.  Objective Vitals:   11/18/21 1705 11/18/21 1745 11/18/21 2320 11/19/21 0800  BP:   100/80 105/64  Pulse: 63 62 70 78  Resp: _0 Temp: 98.8 F (37.1 C) 98.6 F (37 C) 98.7 F (37.1 C) 98.5 F (36.9 C)  TempSrc: Oral Oral  Oral  SpO2: 93% 95% 100% 96%  Weight:      Height:        Intake/Output Summary (Last 24 hours) at 11/19/2021 1224 Last data filed at 11/19/2021 1010 Gross per 24 hour  Intake 1952.5 ml  Output 960 ml  Net 992.5 ml    PULM  CTAB CV  RRR VASC  2+ palpable LLE pulse, wound vac functional with good suction   Laboratory CBC    Component Value Date/Time   WBC 8.1 11/18/2021 1058   HGB 9.4 (L) 11/18/2021 1058   HCT 30.7 (L) 11/18/2021 1058   PLT 146 (L) 11/18/2021 1058    BMET    Component Value Date/Time   NA 141 11/17/2021 0627   K 4.5 11/17/2021 0627   K 3.8 06/06/2012 1632   CL 108 11/17/2021 0627   CO2 25 11/17/2021 0627   GLUCOSE 104 (H) 11/17/2021 0627   BUN 27 (H) 11/17/2021 0627   CREATININE 1.62 (H) 11/17/2021 0627   CALCIUM 8.7 (L) 11/17/2021 0627   GFRNONAA 32 (L) 11/17/2021 0627   GFRAA 37 (L) 01/23/2019 1100    Assessment/Planning: POD #3 s/p left femoral thromboembolectomy for ischemic leg  Patient appears to be acutely confused, not oriented to place, time or situation Patient has been on heparin drip so we will obtain a head CT to ensure there is no bleed prior to transitioning her to Eliquis.  If negative for bleed, can stop heparin and transition to Eliquis 5 mg twice daily Patient continues to have Foley.  We will d/c and obtain UA Will obtain chest x-ray Spoke with daughter to update, she is concerned that morphine may be causing acute delirium, will d/c and continue with oral pain medications   Kris Hartmann  11/19/2021, 12:24 PM

## 2021-11-19 NOTE — Progress Notes (Addendum)
ANTICOAGULATION CONSULT NOTE  Pharmacy Consult for Heparin Infusion Indication: DVT  Patient Measurements: Height: _0  (162.6 cm) Weight: 111.1 kg (244 lb 14.9 oz) IBW/kg (Calculated) : 54.7 Heparin Dosing Weight: 80.1 kg  Labs: Recent Labs    11/17/21 0627 11/17/21 1434 11/18/21 1058 11/18/21 2220 11/19/21 0609 11/19/21 1526  HGB 9.9*  --  9.4*  --   --  8.1*  HCT 32.4*  --  30.7*  --   --  26.5*  PLT 165  --  146*  --   --  160  HEPARINUNFRC 0.54   < > 0.26* 0.41 0.26* 0.43  CREATININE 1.62*  --   --   --   --  1.41*   < > = values in this interval not displayed.     Estimated Creatinine Clearance: 38.2 mL/min (A) (by C-G formula based on SCr of 1.41 mg/dL (H)).   Medical History: Past Medical History:  Diagnosis Date   Depression    HBP (high blood pressure)    History of bladder problems    Memory loss    Muscle pain    Osteoporosis    Reflux    Sinus congestion    Swelling    B/L FEET AND LEGS   Medications:  Scheduled:   aspirin EC  81 mg Oral Q0600   Chlorhexidine Gluconate Cloth  6 each Topical Q0600   docusate sodium  100 mg Oral Daily   loratadine  10 mg Oral Daily   pantoprazole  40 mg Oral Daily   Infusions:   sodium chloride 75 mL/hr at 11/19/21 0933   heparin 1,550 Units/hr (11/19/21 0722)   magnesium sulfate bolus IVPB     PRN: acetaminophen **OR** acetaminophen, alum & mag hydroxide-simeth, bisacodyl, guaiFENesin-dextromethorphan, hydrALAZINE, labetalol, magnesium sulfate bolus IVPB, metoprolol tartrate, mineral oil-hydrophilic petrolatum, ondansetron, mouth rinse, oxyCODONE-acetaminophen, phenol, polyethylene glycol, potassium chloride, sodium phosphate  Assessment: Topacio Cella is a 81 y.o. female presenting with LLE pain, discoloration, numbness, & cold to touch. 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  Patient was not on [facility] PTA per chart review. Pharmacy has been consulted to initiate and manage heparin infusion.   Baseline Labs: aPTT 35, PT 13.4, INR 1.0, Hgb 10.1, Hct 32.7, Plt 165  Goal of Therapy:  Heparin level 0.3-0.7 units/ml Monitor platelets by anticoagulation protocol: Yes   Date Time HL Rate/Comment  9/11  0627 0.54 Therapeutic x1 9/11  1434 0.27 SUBtherapeutic 9/11 2358 0.37 Therapeutic x1 9/12 1058 0.26 SUBtherapeutic 9/12 2220 0.41 Therapeutic x1  9/13 0609 0.26 SUBtherapeutic 9/13 1526 0.43 Therapeutic x1  Plan:  Continue heparin infusion at 1550 units/hr Check anti-Xa level in 8 hours and daily while on heparin Continue to monitor H&H and platelets daily while on heparin  Thank you for allowing pharmacy to be a part of this patient's care.  Gretel Acre, PharmD PGY1 Pharmacy Resident 11/19/2021 4:23 PM

## 2021-11-19 NOTE — Progress Notes (Signed)
Physical Therapy Treatment Patient Details Name: [name] MRN: [id number] DOB: May 01, 1940 Today's Date: 11/19/2021   History of Present Illness Rebecca Gilmore is a 81 y.o. 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 Now s/p L iliofemoral TE w/ patch angioplasty on 11/16/21.    PT Comments    Pt presents to PT in bed and agreeable to participate in therapy services. Pt appears to be confused and lethargic today. Able to participate in supine therapeutic exercises with encouragement, physical assistance, and heavy multimodal cueing, further mobility deferred secondary to pt's level of alertness.  Would benefit from skilled PT at SNF to address above deficits in strength, functional mobility, balance, and activity tolerance to promote optimal return to PLOF.   Recommendations for follow up therapy are one component of a multi-disciplinary discharge planning process, led by the attending physician.  Recommendations may be updated based on patient status, additional functional criteria and insurance authorization.  Follow Up Recommendations  Skilled nursing-short term rehab (<3 hours/day) Can patient physically be transported by private vehicle: No   Assistance Recommended at Discharge Frequent or constant Supervision/Assistance  Patient can return home with the following A little help with walking and/or transfers;Assistance with cooking/housework;Assist for transportation;Help with stairs or ramp for entrance;A little help with bathing/dressing/bathroom   Equipment Recommendations  None recommended by PT    Recommendations for Other Services       Precautions / Restrictions Precautions Precautions: Fall Restrictions Weight Bearing Restrictions: No     Mobility  Bed Mobility                General bed mobility comments: not attempted    Transfers                   General transfer comment: not attempted    Ambulation/Gait               General Gait Details: not attempted   Stairs             Wheelchair Mobility    Modified Rankin (Stroke Patients Only)       Balance Overall balance assessment:  (not attempted)                                          Cognition Arousal/Alertness: Suspect due to medications Behavior During Therapy: Anxious Overall Cognitive Status: Impaired/Different from baseline Area of Impairment: Attention, Memory, Following commands                       Following Commands: Follows one step commands inconsistently, Follows one step commands with increased time       General Comments: increased time to process and follow directions to initiate.        Exercises Total Joint Exercises Ankle Circles/Pumps: AROM, Both, 20 reps Quad Sets: Strengthening, Both, 10 reps, Supine Gluteal Sets: Strengthening, Both, 15 reps, Supine Towel Squeeze: Strengthening, Both, 10 reps, Supine Heel Slides: Strengthening, Both, 15 reps, Supine Hip ABduction/ADduction: AAROM, Both, 10 reps, Supine    General Comments        Pertinent Vitals/Pain Pain Assessment Breathing: normal Negative Vocalization: occasional moan/groan, low speech, negative/disapproving quality Facial Expression: sad, frightened, frown Body Language: relaxed Consolability: no need  to console PAINAD Score: 2 Pain Descriptors / Indicators: Grimacing, Discomfort Pain Intervention(s): Limited activity within patient's tolerance, Monitored during session, Repositioned    Home Living                          Prior Function            PT Goals (current goals can now be found in the care plan section) Acute Rehab PT Goals Patient Stated Goal: to go home PT Goal Formulation: With patient Time For Goal  Achievement: 11/30/21 Potential to Achieve Goals: Fair Progress towards PT goals: Not progressing toward goals - comment (pt presents w AMS today)    Frequency    Min 2X/week      PT Plan Current plan remains appropriate    Co-evaluation              AM-PAC PT "6 Clicks" Mobility   Outcome Measure  Help needed turning from your back to your side while in a flat bed without using bedrails?: A Lot Help needed moving from lying on your back to sitting on the side of a flat bed without using bedrails?: A Lot Help needed moving to and from a bed to a chair (including a wheelchair)?: A Lot Help needed standing up from a chair using your arms (e.g., wheelchair or bedside chair)?: A Lot Help needed to walk in hospital room?: A Lot Help needed climbing 3-5 steps with a railing? : Total 6 Click Score: 11    End of Session Equipment Utilized During Treatment: Gait belt;Oxygen Activity Tolerance: Patient limited by lethargy Patient left: in bed;with call bell/phone within reach;with nursing/sitter in room;with bed alarm set;with family/visitor present Nurse Communication: Mobility status PT Visit Diagnosis: Unsteadiness on feet (R26.81);Muscle weakness (generalized) (M62.81)     Time: 0148-4039 PT Time Calculation (min) (ACUTE ONLY): 25 min  Charges:                       Glenice Laine MPH, SPT 11/19/21, 4:24 PM

## 2021-11-19 NOTE — Care Management Important Message (Signed)
Important Message  Patient Details  Name: Rebecca Gilmore MRN: [id number] Date of Birth: April 09, 1940   Medicare Important Message Given:  Yes     Zeriyah Wain, [place] 11/19/2021, 1:31 PM

## 2021-11-19 NOTE — Progress Notes (Signed)
Heparin bag replaced at 1045, was cosigned by [employer] but when order was sent to IV and started at the pump, MAR would not pick up signal that it had been started.  Could not get this to show up on [facility] so note is being written.

## 2021-11-20 LAB — URINALYSIS, ROUTINE W REFLEX MICROSCOPIC
Bilirubin Urine: NEGATIVE
Glucose, UA: NEGATIVE mg/dL
Ketones, ur: NEGATIVE mg/dL
Nitrite: NEGATIVE
Protein, ur: 30 mg/dL — AB
Specific Gravity, Urine: 1.015 (ref 1.005–1.030)
pH: 5 (ref 5.0–8.0)

## 2021-11-20 LAB — CBC
HCT: 29.8 % — ABNORMAL LOW (ref 36.0–46.0)
Hemoglobin: 9.7 g/dL — ABNORMAL LOW (ref 12.0–15.0)
MCH: 30 pg (ref 26.0–34.0)
MCHC: 32.6 g/dL (ref 30.0–36.0)
MCV: 92.3 fL (ref 80.0–100.0)
Platelets: 180 10*3/uL (ref 150–400)
RBC: 3.23 MIL/uL — ABNORMAL LOW (ref 3.87–5.11)
RDW: 13.5 % (ref 11.5–15.5)
WBC: 6.5 10*3/uL (ref 4.0–10.5)
nRBC: 0 % (ref 0.0–0.2)

## 2021-11-20 LAB — HEPARIN LEVEL (UNFRACTIONATED)
Heparin Unfractionated: 0.28 IU/mL — ABNORMAL LOW (ref 0.30–0.70)
Heparin Unfractionated: 0.4 IU/mL (ref 0.30–0.70)

## 2021-11-20 LAB — TYPE AND SCREEN
ABO/RH(D): A NEG
Antibody Screen: NEGATIVE

## 2021-11-20 MED ORDER — AMITRIPTYLINE HCL 25 MG PO TABS
25.0000 mg | ORAL_TABLET | Freq: Every day | ORAL | Status: DC
  Administered 2021-11-20 – 2021-11-23 (×4): 25 mg via ORAL
  Filled 2021-11-20 (×5): qty 1

## 2021-11-20 MED ORDER — CIPROFLOXACIN HCL 500 MG PO TABS
500.0000 mg | ORAL_TABLET | Freq: Two times a day (BID) | ORAL | Status: AC
  Administered 2021-11-20 – 2021-11-22 (×6): 500 mg via ORAL
  Filled 2021-11-20 (×6): qty 1

## 2021-11-20 MED ORDER — APIXABAN 5 MG PO TABS
10.0000 mg | ORAL_TABLET | Freq: Two times a day (BID) | ORAL | Status: DC
  Administered 2021-11-20 – 2021-11-25 (×11): 10 mg via ORAL
  Filled 2021-11-20 (×11): qty 2

## 2021-11-20 MED ORDER — ROSUVASTATIN CALCIUM 5 MG PO TABS
5.0000 mg | ORAL_TABLET | Freq: Every day | ORAL | Status: DC
  Administered 2021-11-20 – 2021-11-24 (×5): 5 mg via ORAL
  Filled 2021-11-20 (×5): qty 1

## 2021-11-20 MED ORDER — SERTRALINE HCL 50 MG PO TABS
25.0000 mg | ORAL_TABLET | Freq: Every day | ORAL | Status: DC
  Administered 2021-11-20 – 2021-11-25 (×6): 25 mg via ORAL
  Filled 2021-11-20 (×6): qty 1

## 2021-11-20 MED ORDER — APIXABAN 5 MG PO TABS: 5.0000 mg | ORAL_TABLET | Freq: Two times a day (BID) | ORAL | Status: DC

## 2021-11-20 MED ORDER — PREGABALIN 75 MG PO CAPS
75.0000 mg | ORAL_CAPSULE | Freq: Every day | ORAL | Status: DC
  Administered 2021-11-20 – 2021-11-25 (×6): 75 mg via ORAL
  Filled 2021-11-20 (×6): qty 1

## 2021-11-20 MED ORDER — METOPROLOL SUCCINATE ER 25 MG PO TB24
25.0000 mg | ORAL_TABLET | Freq: Every day | ORAL | Status: DC
  Administered 2021-11-20 – 2021-11-25 (×5): 25 mg via ORAL
  Filled 2021-11-20 (×6): qty 1

## 2021-11-20 NOTE — Progress Notes (Signed)
[facility]  Daily Progress Note   Subjective  -   Patient denies any pain in her foot area however patient continues to be fairly and oriented.  She is more alert than previously however  Objective Vitals:   11/19/21 1551 11/19/21 2336 11/20/21 0500 11/20/21 0824  BP: (!) 129/48 (!) 171/54 (!) 181/75 (!) 173/56  Pulse: 67 66 73 (!) 51  Resp: _0 Temp: 98.1 F (36.7 C) 98.5 F (36.9 C)  98 F (36.7 C)  TempSrc:      SpO2: 98% 93%  93%  Weight:      Height:        Intake/Output Summary (Last 24 hours) at 11/20/2021 1110 Last data filed at 11/20/2021 0545 Gross per 24 hour  Intake --  Output 800 ml  Net -800 ml    PULM  CTAB CV  RRR VASC  wound VAC still with good suction, minimal output.  Foot warm  Laboratory CBC    Component Value Date/Time   WBC 6.5 11/20/2021 0646   HGB 9.7 (L) 11/20/2021 0646   HCT 29.8 (L) 11/20/2021 0646   PLT 180 11/20/2021 0646    BMET    Component Value Date/Time   NA 138 11/19/2021 1526   K 4.4 11/19/2021 1526   K 3.8 06/06/2012 1632   CL 110 11/19/2021 1526   CO2 24 11/19/2021 1526   GLUCOSE 103 (H) 11/19/2021 1526   BUN 22 11/19/2021 1526   CREATININE 1.41 (H) 11/19/2021 1526   CALCIUM 8.6 (L) 11/19/2021 1526   GFRNONAA 37 (L) 11/19/2021 1526   GFRAA 37 (L) 01/23/2019 1100    Assessment/Planning: POD #3 s/p left femoral thromboembolectomy for ischemic leg  Patient still not oriented but more alert Urinalysis shows evidence of UTI, will begin Cipro No evidence of bleed on CT scan we will stop heparin drip and transition to Eliquis.  We will begin with loading dose of 10 mg Eliquis twice daily, will transition to 5 mg in a week Noted that patient has not received many of her home medications.  We will reintroduce these. Chest x-ray negative for any acute changes May consider neurology referral if confusion continues following recent medication changes. We will continue to work with PT and OT  despite confusion  Kris Hartmann  11/20/2021, 11:10 AM

## 2021-11-20 NOTE — Plan of Care (Signed)
  Problem: Education: Goal: Knowledge of General Education information will improve Description: Including pain rating scale, medication(s)/side effects and non-pharmacologic comfort measures Outcome: Progressing   Problem: Health Behavior/Discharge Planning: Goal: Ability to manage health-related needs will improve Outcome: Progressing   Problem: Clinical Measurements: Goal: Ability to maintain clinical measurements within normal limits will improve Outcome: Progressing Goal: Will remain free from infection Outcome: Progressing Goal: Diagnostic test results will improve Outcome: Progressing Goal: Respiratory complications will improve Outcome: Progressing Goal: Cardiovascular complication will be avoided Outcome: Progressing   Problem: Activity: Goal: Risk for activity intolerance will decrease Outcome: Progressing   Problem: Nutrition: Goal: Adequate nutrition will be maintained Outcome: Progressing   Problem: Coping: Goal: Level of anxiety will decrease Outcome: Progressing   Problem: Elimination: Goal: Will not experience complications related to bowel motility Outcome: Progressing Goal: Will not experience complications related to urinary retention Outcome: Progressing   Problem: Pain Managment: Goal: General experience of comfort will improve Outcome: Progressing   Problem: Safety: Goal: Ability to remain free from injury will improve Outcome: Progressing   Problem: Skin Integrity: Goal: Risk for impaired skin integrity will decrease Outcome: Progressing   Problem: Education: Goal: Knowledge of prescribed regimen will improve Outcome: Progressing   Problem: Activity: Goal: Ability to tolerate increased activity will improve Outcome: Progressing   Problem: Bowel/Gastric: Goal: Gastrointestinal status for postoperative course will improve Outcome: Progressing   Problem: Clinical Measurements: Goal: Postoperative complications will be avoided or  minimized Outcome: Progressing Goal: Signs and symptoms of graft occlusion will improve Outcome: Progressing   Problem: Skin Integrity: Goal: Demonstration of wound healing without infection will improve Outcome: Progressing

## 2021-11-20 NOTE — TOC Progression Note (Signed)
Transition of Care [facility]) - Progression Note    Patient Details  Name: Rebecca Gilmore MRN: [id number] Date of Birth: [birth year]  Transition [facility]) CM/SW [place], RN Phone Number: 11/20/2021, 12:41 PM  Clinical Narrative:     Corene Cornea confirmed that they would accept the patient for [facility] PT  Expected Discharge Plan: [place] Barriers to Discharge: Continued Medical Work up  Expected Discharge Plan and Services Patient does not want to go to SNF but would agree to home health PT and OT.  [facility] is se tup with for [facility]   Discharge Planning Services: CM Consult Post Acute Care Choice: [place] arrangements for the past 2 months: Single Family Home                 DME Arranged: N/A DME Agency: NA       HH Arranged: PT, OT [place] Agency: [place] ([place]) Date [place]: 11/17/21 Time [address] [place]: 1532 Representative spoke with at [place]: Corene Cornea   Social Determinants of Health ([place]) Interventions    Readmission Risk Interventions     No data to display

## 2021-11-20 NOTE — Progress Notes (Signed)
ANTICOAGULATION CONSULT NOTE  Pharmacy Consult for Heparin Infusion Indication: DVT  Patient Measurements: Height: _0  (162.6 cm) Weight: 111.1 kg (244 lb 14.9 oz) IBW/kg (Calculated) : 54.7 Heparin Dosing Weight: 80.1 kg  Labs: Recent Labs    11/17/21 0627 11/17/21 1434 11/18/21 1058 11/18/21 2220 11/19/21 0609 11/19/21 1526 11/19/21 2355  HGB 9.9*  --  9.4*  --   --  8.1*  --   HCT 32.4*  --  30.7*  --   --  26.5*  --   PLT 165  --  146*  --   --  160  --   HEPARINUNFRC 0.54   < > 0.26*   < > 0.26* 0.43 0.40  CREATININE 1.62*  --   --   --   --  1.41*  --    < > = values in this interval not displayed.     Estimated Creatinine Clearance: 38.2 mL/min (A) (by C-G formula based on SCr of 1.41 mg/dL (H)).   Medical History: Past Medical History:  Diagnosis Date   Depression    HBP (high blood pressure)    History of bladder problems    Memory loss    Muscle pain    Osteoporosis    Reflux    Sinus congestion    Swelling    B/L FEET AND LEGS   Medications:  Scheduled:   aspirin EC  81 mg Oral Q0600   Chlorhexidine Gluconate Cloth  6 each Topical Q0600   docusate sodium  100 mg Oral Daily   loratadine  10 mg Oral Daily   pantoprazole  40 mg Oral Daily   Infusions:   sodium chloride 75 mL/hr at 11/19/21 2258   heparin 1,550 Units/hr (11/19/21 0722)   magnesium sulfate bolus IVPB     PRN: acetaminophen **OR** acetaminophen, alum & mag hydroxide-simeth, bisacodyl, guaiFENesin-dextromethorphan, hydrALAZINE, labetalol, magnesium sulfate bolus IVPB, metoprolol tartrate, mineral oil-hydrophilic petrolatum, ondansetron, mouth rinse, oxyCODONE-acetaminophen, phenol, polyethylene glycol, potassium chloride, sodium phosphate  Assessment: Rebecca Gilmore is a 81 y.o. female presenting with LLE pain, discoloration, numbness, & cold to touch. 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 Patient was not on [facility] PTA per chart review. Pharmacy has been consulted to initiate and manage heparin infusion.   Baseline Labs: aPTT 35, PT 13.4, INR 1.0, Hgb 10.1, Hct 32.7, Plt 165  Goal of Therapy:  Heparin level 0.3-0.7 units/ml Monitor platelets by anticoagulation protocol: Yes   Date Time HL Rate/Comment  9/11  0627 0.54 Therapeutic x1 9/11  1434 0.27 SUBtherapeutic 9/11 2358 0.37 Therapeutic x1 9/12 1058 0.26 SUBtherapeutic 9/12 2220 0.41 Therapeutic x1  9/13 0609 0.26 SUBtherapeutic 9/13 1526 0.43 Therapeutic x1 9/13     2355    0.40    Therapeutic X 2   Plan:  9/13:  HL @ 1610 = 0.40, therapeutic X 2 Will continue pt on current rate and recheck HL on 9/14 with AM labs.   [name] D 11/20/2021 1:06 AM

## 2021-11-20 NOTE — Progress Notes (Signed)
ANTICOAGULATION CONSULT NOTE  Pharmacy Consult for Apixaban Indication: DVT  Patient Measurements: Height: _0  (162.6 cm) Weight: 111.1 kg (244 lb 14.9 oz) IBW/kg (Calculated) : 54.7 Heparin Dosing Weight: 80.1 kg  Labs: Recent Labs    11/18/21 1058 11/18/21 2220 11/19/21 1526 11/19/21 2355 11/20/21 0646  HGB 9.4*  --  8.1*  --  9.7*  HCT 30.7*  --  26.5*  --  29.8*  PLT 146*  --  160  --  180  HEPARINUNFRC 0.26*   < > 0.43 0.40 0.28*  CREATININE  --   --  1.41*  --   --    < > = values in this interval not displayed.     Estimated Creatinine Clearance: 38.2 mL/min (A) (by C-G formula based on SCr of 1.41 mg/dL (H)).   Medical History: Past Medical History:  Diagnosis Date   Depression    HBP (high blood pressure)    History of bladder problems    Memory loss    Muscle pain    Osteoporosis    Reflux    Sinus congestion    Swelling    B/L FEET AND LEGS   Medications:  Scheduled:   amitriptyline  25 mg Oral QHS   aspirin EC  81 mg Oral Q0600   Chlorhexidine Gluconate Cloth  6 each Topical Q0600   ciprofloxacin  500 mg Oral BID   docusate sodium  100 mg Oral Daily   loratadine  10 mg Oral Daily   metoprolol succinate  25 mg Oral Daily   pantoprazole  40 mg Oral Daily   pregabalin  75 mg Oral Daily   rosuvastatin  5 mg Oral Daily   sertraline  25 mg Oral Daily   Infusions:   sodium chloride 75 mL/hr at 11/19/21 2258   heparin 1,550 Units/hr (11/20/21 0318)   magnesium sulfate bolus IVPB     PRN: acetaminophen **OR** acetaminophen, alum & mag hydroxide-simeth, bisacodyl, guaiFENesin-dextromethorphan, hydrALAZINE, labetalol, magnesium sulfate bolus IVPB, metoprolol tartrate, mineral oil-hydrophilic petrolatum, ondansetron, mouth rinse, oxyCODONE-acetaminophen, phenol, polyethylene glycol, potassium chloride, sodium phosphate  Assessment: Rebecca Gilmore is a 81 y.o. female presenting with LLE pain, discoloration, numbness, & cold to touch. 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 Patient was not on [facility] PTA per chart review. Pharmacy has been consulted to initiate and manage heparin infusion.   Baseline Labs: aPTT 35, PT 13.4, INR 1.0, Hgb 10.1, Hct 32.7, Plt 165  Goal of Therapy:  Heparin level 0.3-0.7 units/ml Monitor platelets by anticoagulation protocol: Yes   Date Time HL Rate/Comment  9/11  0627 0.54 Therapeutic x1 9/11  1434 0.27 SUBtherapeutic 9/11 2358 0.37 Therapeutic x1 9/12 1058 0.26 SUBtherapeutic 9/12 2220 0.41 Therapeutic x1  9/13 0609 0.26 SUBtherapeutic 9/13 1526 0.43 Therapeutic x1 9/13     2355    0.40    Therapeutic x2  9/14 0646 0.28 SUBtherapeutic   Plan:  Switch to Apixaban 10 mg BID x7 days followed by 5 mg BID thereafter per discussion with NP via secure chat Pharmacy will sign off  Thank you for allowing pharmacy to be a part of this patient's care.   Gretel Acre, PharmD PGY1 Pharmacy Resident 11/20/2021 10:37 AM

## 2021-11-21 LAB — BASIC METABOLIC PANEL
Anion gap: 7 (ref 5–15)
BUN: 24 mg/dL — ABNORMAL HIGH (ref 8–23)
CO2: 20 mmol/L — ABNORMAL LOW (ref 22–32)
Calcium: 8.9 mg/dL (ref 8.9–10.3)
Chloride: 112 mmol/L — ABNORMAL HIGH (ref 98–111)
Creatinine, Ser: 1.26 mg/dL — ABNORMAL HIGH (ref 0.44–1.00)
GFR, Estimated: 43 mL/min — ABNORMAL LOW (ref >60)
Glucose, Bld: 88 mg/dL (ref 70–99)
Potassium: 4.6 mmol/L (ref 3.5–5.1)
Sodium: 139 mmol/L (ref 135–145)

## 2021-11-21 LAB — CBC
HCT: 25.6 % — ABNORMAL LOW (ref 36.0–46.0)
Hemoglobin: 8 g/dL — ABNORMAL LOW (ref 12.0–15.0)
MCH: 30.2 pg (ref 26.0–34.0)
MCHC: 31.3 g/dL (ref 30.0–36.0)
MCV: 96.6 fL (ref 80.0–100.0)
Platelets: 173 10*3/uL (ref 150–400)
RBC: 2.65 MIL/uL — ABNORMAL LOW (ref 3.87–5.11)
RDW: 13.6 % (ref 11.5–15.5)
WBC: 4.5 10*3/uL (ref 4.0–10.5)
nRBC: 0 % (ref 0.0–0.2)

## 2021-11-21 MED ORDER — PREDNISONE 1 MG PO TABS
2.0000 mg | ORAL_TABLET | Freq: Every day | ORAL | Status: DC
  Administered 2021-11-22 – 2021-11-25 (×4): 2 mg via ORAL
  Filled 2021-11-21 (×4): qty 2

## 2021-11-21 MED ORDER — FUROSEMIDE 10 MG/ML IJ SOLN
40.0000 mg | Freq: Once | INTRAMUSCULAR | Status: AC
  Administered 2021-11-21: 40 mg via INTRAVENOUS
  Filled 2021-11-21: qty 4

## 2021-11-21 NOTE — Plan of Care (Signed)
  Problem: Education: Goal: Knowledge of General Education information will improve Description: Including pain rating scale, medication(s)/side effects and non-pharmacologic comfort measures Outcome: Progressing   Problem: Health Behavior/Discharge Planning: Goal: Ability to manage health-related needs will improve Outcome: Progressing   Problem: Clinical Measurements: Goal: Ability to maintain clinical measurements within normal limits will improve Outcome: Progressing Goal: Will remain free from infection Outcome: Progressing Goal: Diagnostic test results will improve Outcome: Progressing Goal: Respiratory complications will improve Outcome: Progressing Goal: Cardiovascular complication will be avoided Outcome: Progressing   Problem: Activity: Goal: Risk for activity intolerance will decrease Outcome: Progressing   Problem: Nutrition: Goal: Adequate nutrition will be maintained Outcome: Progressing   Problem: Coping: Goal: Level of anxiety will decrease Outcome: Progressing   Problem: Elimination: Goal: Will not experience complications related to bowel motility Outcome: Progressing Goal: Will not experience complications related to urinary retention Outcome: Progressing   Problem: Pain Managment: Goal: General experience of comfort will improve Outcome: Progressing   Problem: Safety: Goal: Ability to remain free from injury will improve Outcome: Progressing   Problem: Skin Integrity: Goal: Risk for impaired skin integrity will decrease Outcome: Progressing   Problem: Education: Goal: Knowledge of prescribed regimen will improve Outcome: Progressing   Problem: Activity: Goal: Ability to tolerate increased activity will improve Outcome: Progressing   Problem: Bowel/Gastric: Goal: Gastrointestinal status for postoperative course will improve Outcome: Progressing   Problem: Clinical Measurements: Goal: Postoperative complications will be avoided or  minimized Outcome: Progressing Goal: Signs and symptoms of graft occlusion will improve Outcome: Progressing   Problem: Skin Integrity: Goal: Demonstration of wound healing without infection will improve Outcome: Progressing

## 2021-11-21 NOTE — Progress Notes (Signed)
Physical Therapy Treatment Patient Details Name: Rebecca Gilmore MRN: [id number] DOB: 06-10-1940 Today's Date: 11/21/2021   History of Present Illness Rebecca Gilmore is a 81 y.o. 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 Now s/p L iliofemoral TE w/ patch angioplasty on 11/16/21.    PT Comments    Author was contacted by RN staff with pt requesting to get on [facility] for BM. Pt's was alert and motivated however cognition is a concern throughout. She was very anxious but once motivated and relaxed, was able to squat pivot with mod assist (+1 one with 2nd person for safety) to [facility]. After unsuccessful BM, pt stood to RW and took several steps back to EOB with +1 only. Fear of falling/lack of insight of her abilities/cognition limited progress more so than strength or physical abilities. She will benefit from continued skilled PT to maximize independence prior to returning home.     Recommendations for follow up therapy are one component of a multi-disciplinary discharge planning process, led by the attending physician.  Recommendations may be updated based on patient status, additional functional criteria and insurance authorization.  Follow Up Recommendations  Skilled nursing-short term rehab (<3 hours/day)     Assistance Recommended at Discharge Frequent or constant Supervision/Assistance  Patient can return home with the following A little help with walking and/or transfers;Assistance with cooking/housework;Assist for transportation;Help with stairs or ramp for entrance;A little help with bathing/dressing/bathroom   Equipment Recommendations  None recommended by PT       Precautions / Restrictions Precautions Precautions: Fall Restrictions Weight Bearing Restrictions: No     Mobility  Bed  Mobility Overal bed mobility: Needs Assistance Bed Mobility: Supine to Sit, Sit to Supine  Supine to sit: Min assist, Mod assist Sit to supine: Min assist, Mod assist   General bed mobility comments: min-mod of one with increased time + vcs throughout for sequencing improvements    Transfers Overall transfer level: Needs assistance Equipment used: Rolling walker (2 wheels) Transfers: Sit to/from Stand Sit to Stand: Mod assist, From elevated surface, +2 safety/equipment      General transfer comment: Pt was able to stand pivot to [facility] with mod assist of one + 2nd person for safety. after unsuccessful BM pt perform stand step back to EOB with +1 min assist and use of RW. Pt's fear/cognition has limited her progress moreso than strength deficits or mobility deficits    Ambulation/Gait Ambulation/Gait assistance: Min assist Gait Distance (Feet): 3 Feet Assistive device: Rolling walker (2 wheels) Gait Pattern/deviations: Step-to pattern Gait velocity: decr     General Gait Details: pt was able to take several steps form BSC > EOB with RW + min assist. mostly needed vcs for encouragement not for technique or sequencing     Balance Overall balance assessment: Needs assistance Sitting-balance support: Bilateral upper extremity supported, Feet supported Sitting balance-Leahy Scale: Fair     Standing balance support: Bilateral upper extremity supported, During functional activity, Reliant on assistive device for balance Standing balance-Leahy Scale: Fair Standing balance comment: reliant on RW during standing      Cognition Arousal/Alertness: Awake/alert Behavior During Therapy: WFL for tasks assessed/performed (slightly anxious) Overall Cognitive Status: Impaired/Different from baseline (per daughter)        Following Commands: Follows one step commands consistently, Follows one step commands with increased time  General Comments: Pt is alert and cooperative. able to  consistently follow commands. Under estimates her abilities. Was able to perform desired task with mostly encouragement. RN staff concerned and requested assistance with getting pt on/off BSC               Pertinent Vitals/Pain Pain Assessment Pain Assessment: 0-10 Pain Score: 3  Pain Location: BLEs Pain Descriptors / Indicators: Grimacing, Discomfort Pain Intervention(s): Limited activity within patient's tolerance, Monitored during session, Premedicated before session, Repositioned     PT Goals (current goals can now be found in the care plan section) Acute Rehab PT Goals Patient Stated Goal: get better and return home Progress towards PT goals: Progressing toward goals    Frequency    Min 2X/week      PT Plan Current plan remains appropriate       AM-PAC PT "6 Clicks" Mobility   Outcome Measure  Help needed turning from your back to your side while in a flat bed without using bedrails?: A Little Help needed moving from lying on your back to sitting on the side of a flat bed without using bedrails?: A Little Help needed moving to and from a bed to a chair (including a wheelchair)?: A Little Help needed standing up from a chair using your arms (e.g., wheelchair or bedside chair)?: A Lot Help needed to walk in hospital room?: A Lot Help needed climbing 3-5 steps with a railing? : A Lot 6 Click Score: 15    End of Session   Activity Tolerance: Patient tolerated treatment well Patient left: in bed;with call bell/phone within reach;with bed alarm set;with family/visitor present Nurse Communication: Mobility status PT Visit Diagnosis: Unsteadiness on feet (R26.81);Muscle weakness (generalized) (M62.81)     Time: 9574-7340 PT Time Calculation (min) (ACUTE ONLY): 11 min  Charges:  $Therapeutic Activity: 8-22 mins                    Julaine Fusi PTA 11/21/21, 5:27 PM

## 2021-11-21 NOTE — Plan of Care (Signed)

## 2021-11-21 NOTE — Progress Notes (Signed)
[facility]  Daily Progress Note   Subjective  -   Patient oriented to place, time and situation today.  Pain well controlled.   Objective Vitals:   11/20/21 1616 11/20/21 2130 11/21/21 0506 11/21/21 0800  BP: (!) 100/47 (!) 145/48 (!) 150/49 (!) 173/56  Pulse: (!) 54 (!) 54 (!) 56 (!) 55  Resp: _0 Temp: 98.1 F (36.7 C) 98.3 F (36.8 C) 98.3 F (36.8 C) 98 F (36.7 C)  TempSrc:      SpO2: 93% 95% 94% 99%  Weight:      Height:        Intake/Output Summary (Last 24 hours) at 11/21/2021 0824 Last data filed at 11/21/2021 0000 Gross per 24 hour  Intake --  Output 800 ml  Net -800 ml    PULM  CTAB CV  RRR VASC  2+LLE DP pulse  Laboratory CBC    Component Value Date/Time   WBC 4.5 11/21/2021 0641   HGB 8.0 (L) 11/21/2021 0641   HCT 25.6 (L) 11/21/2021 0641   PLT 173 11/21/2021 0641    BMET    Component Value Date/Time   NA 139 11/21/2021 0641   K 4.6 11/21/2021 0641   K 3.8 06/06/2012 1632   CL 112 (H) 11/21/2021 0641   CO2 20 (L) 11/21/2021 0641   GLUCOSE 88 11/21/2021 0641   BUN 24 (H) 11/21/2021 0641   CREATININE 1.26 (H) 11/21/2021 0641   CALCIUM 8.9 11/21/2021 0641   GFRNONAA 43 (L) 11/21/2021 0641   GFRAA 37 (L) 01/23/2019 1100    Assessment/Planning: POD #4 s/p left femoral thromboembolectomy for ischemic leg  Patient alert and oriented x3 today Continue to work with PT in preparation for D/C home with home health (patient declining rehab) Continue prevena wound vac  Kris Hartmann  11/21/2021, 8:24 AM

## 2021-11-21 NOTE — Care Management Important Message (Signed)
Important Message  Patient Details  Name: Rebecca Gilmore MRN: [id number] Date of Birth: 01-26-1941   Medicare Important Message Given:  Yes     Loann Quill 11/21/2021, 1:23 PM

## 2021-11-22 NOTE — Plan of Care (Signed)
  Problem: Education: Goal: Knowledge of General Education information will improve Description: Including pain rating scale, medication(s)/side effects and non-pharmacologic comfort measures Outcome: Progressing   Problem: Health Behavior/Discharge Planning: Goal: Ability to manage health-related needs will improve Outcome: Progressing   Problem: Clinical Measurements: Goal: Ability to maintain clinical measurements within normal limits will improve Outcome: Progressing Goal: Will remain free from infection Outcome: Progressing Goal: Diagnostic test results will improve Outcome: Progressing Goal: Respiratory complications will improve Outcome: Progressing Goal: Cardiovascular complication will be avoided Outcome: Progressing   Problem: Activity: Goal: Risk for activity intolerance will decrease Outcome: Progressing   Problem: Nutrition: Goal: Adequate nutrition will be maintained Outcome: Progressing   Problem: Coping: Goal: Level of anxiety will decrease Outcome: Progressing   Problem: Elimination: Goal: Will not experience complications related to bowel motility Outcome: Progressing Goal: Will not experience complications related to urinary retention Outcome: Progressing   Problem: Pain Managment: Goal: General experience of comfort will improve Outcome: Progressing   Problem: Safety: Goal: Ability to remain free from injury will improve Outcome: Progressing   Problem: Skin Integrity: Goal: Risk for impaired skin integrity will decrease Outcome: Progressing   Problem: Education: Goal: Knowledge of prescribed regimen will improve Outcome: Progressing   Problem: Activity: Goal: Ability to tolerate increased activity will improve Outcome: Progressing   Problem: Bowel/Gastric: Goal: Gastrointestinal status for postoperative course will improve Outcome: Progressing   Problem: Clinical Measurements: Goal: Postoperative complications will be avoided or  minimized Outcome: Progressing Goal: Signs and symptoms of graft occlusion will improve Outcome: Progressing   Problem: Skin Integrity: Goal: Demonstration of wound healing without infection will improve Outcome: Progressing

## 2021-11-22 NOTE — Progress Notes (Signed)
6 Days Post-Op   Subjective/Chief Complaint: Doing Ok. Denies Left Leg pain. Notes discomfort in LEFT groin from [place]. States right leg is uncomfortable but feels its secondary to being in the bed. Worked with [facility] yesterday   Objective: Vital signs in last 24 hours: Temp:  [98 F (36.7 C)-98.6 F (37 C)] 98 F (36.7 C) (09/16 0756) Pulse Rate:  [51-61] 59 (09/16 0756) Resp:  [17-20] 18 (09/16 0426) BP: (107-150)/(39-51) 150/47 (09/16 0756) SpO2:  [94 %-97 %] 95 % (09/16 0756) Last BM Date : 11/21/21  Intake/Output from previous day: 09/15 0701 - 09/16 0700 In: 240 [P.O.:240] Out: 2500 [Urine:2500] Intake/Output this shift: No intake/output data recorded.  General appearance: alert and no distress Cardio: regular rate and rhythm Extremities: LEFT leg- warm, +DP, Prevena in place-  Right leg warm, nontender, +DP  Lab Results:  Recent Labs    11/20/21 0646 11/21/21 0641  WBC 6.5 4.5  HGB 9.7* 8.0*  HCT 29.8* 25.6*  PLT 180 173   BMET Recent Labs    11/19/21 1526 11/21/21 0641  NA 138 139  K 4.4 4.6  CL 110 112*  CO2 24 20*  GLUCOSE 103* 88  BUN 22 24*  CREATININE 1.41* 1.26*  CALCIUM 8.6* 8.9   PT/INR No results for input(s): "LABPROT", "INR" in the last 72 hours. ABG No results for input(s): "PHART", "HCO3" in the last 72 hours.  Invalid input(s): "PCO2", "PO2"  Studies/Results: No results found.  Anti-infectives: Anti-infectives (From admission, onward)    Start     Dose/Rate Route Frequency Ordered Stop   11/20/21 1100  ciprofloxacin (CIPRO) tablet 500 mg        500 mg Oral 2 times daily 11/20/21 1012 11/23/21 0759       Assessment/Plan: s/p Procedure(s): THROMBECTOMY FEMORAL ARTERY (Left) POD #5 Continue PT- OOB Monitor exam Monitor SBP- somewhat elevated- continue home regimen  LOS: 6 days    Jamesetta So A 11/22/2021

## 2021-11-23 MED ORDER — ENSURE ENLIVE PO LIQD: 237.0000 mL | Freq: Two times a day (BID) | ORAL | Status: DC

## 2021-11-23 NOTE — Plan of Care (Signed)
  Problem: Education: Goal: Knowledge of General Education information will improve Description: Including pain rating scale, medication(s)/side effects and non-pharmacologic comfort measures Outcome: Progressing   Problem: Clinical Measurements: Goal: Ability to maintain clinical measurements within normal limits will improve Outcome: Progressing   Problem: Elimination: Goal: Will not experience complications related to bowel motility Outcome: Progressing   Problem: Coping: Goal: Level of anxiety will decrease Outcome: Progressing   Problem: Nutrition: Goal: Adequate nutrition will be maintained Outcome: Progressing   Problem: Pain Managment: Goal: General experience of comfort will improve Outcome: Progressing

## 2021-11-23 NOTE — Plan of Care (Signed)
  Problem: Education: Goal: Knowledge of General Education information will improve Description: Including pain rating scale, medication(s)/side effects and non-pharmacologic comfort measures Outcome: Progressing   Problem: Health Behavior/Discharge Planning: Goal: Ability to manage health-related needs will improve Outcome: Progressing   Problem: Clinical Measurements: Goal: Ability to maintain clinical measurements within normal limits will improve Outcome: Progressing Goal: Will remain free from infection Outcome: Progressing Goal: Diagnostic test results will improve Outcome: Progressing Goal: Respiratory complications will improve Outcome: Progressing Goal: Cardiovascular complication will be avoided Outcome: Progressing   Problem: Activity: Goal: Risk for activity intolerance will decrease Outcome: Progressing   Problem: Nutrition: Goal: Adequate nutrition will be maintained Outcome: Progressing   Problem: Coping: Goal: Level of anxiety will decrease Outcome: Progressing   Problem: Elimination: Goal: Will not experience complications related to bowel motility Outcome: Progressing Goal: Will not experience complications related to urinary retention Outcome: Progressing   Problem: Pain Managment: Goal: General experience of comfort will improve Outcome: Progressing   Problem: Safety: Goal: Ability to remain free from injury will improve Outcome: Progressing   Problem: Skin Integrity: Goal: Risk for impaired skin integrity will decrease Outcome: Progressing   Problem: Education: Goal: Knowledge of prescribed regimen will improve Outcome: Progressing   Problem: Activity: Goal: Ability to tolerate increased activity will improve Outcome: Progressing   Problem: Bowel/Gastric: Goal: Gastrointestinal status for postoperative course will improve Outcome: Progressing   Problem: Clinical Measurements: Goal: Postoperative complications will be avoided or  minimized Outcome: Progressing Goal: Signs and symptoms of graft occlusion will improve Outcome: Progressing   Problem: Skin Integrity: Goal: Demonstration of wound healing without infection will improve Outcome: Progressing

## 2021-11-23 NOTE — Progress Notes (Signed)
7 Days Post-Op   Subjective/Chief Complaint: Doing OK. Without complaint. Denies leg pain. States she walked to bathroom last night independently with walker   Objective: Vital signs in last 24 hours: Temp:  [97.6 F (36.4 C)-98.7 F (37.1 C)] 97.6 F (36.4 C) (09/17 0737) Pulse Rate:  [51-58] 51 (09/17 0737) Resp:  [16-20] 18 (09/17 0737) BP: (121-137)/(48-70) 135/48 (09/17 0737) SpO2:  [75 %-99 %] 94 % (09/17 0737) Last BM Date : 11/22/21  Intake/Output from previous day: 09/16 0701 - 09/17 0700 In: -  Out: 900 [Urine:900] Intake/Output this shift: No intake/output data recorded.  General appearance: alert and no distress Cardio: regular rate and rhythm Extremities: warm, LEFT- warm, Prevena in place, +DP  Lab Results:  Recent Labs    11/21/21 0641  WBC 4.5  HGB 8.0*  HCT 25.6*  PLT 173   BMET Recent Labs    11/21/21 0641  NA 139  K 4.6  CL 112*  CO2 20*  GLUCOSE 88  BUN 24*  CREATININE 1.26*  CALCIUM 8.9   PT/INR No results for input(s): "LABPROT", "INR" in the last 72 hours. ABG No results for input(s): "PHART", "HCO3" in the last 72 hours.  Invalid input(s): "PCO2", "PO2"  Studies/Results: No results found.  Anti-infectives: Anti-infectives (From admission, onward)    Start     Dose/Rate Route Frequency Ordered Stop   11/20/21 1100  ciprofloxacin (CIPRO) tablet 500 mg        500 mg Oral 2 times daily 11/20/21 1012 11/22/21 2031       Assessment/Plan: s/p Procedure(s): THROMBECTOMY FEMORAL ARTERY (Left) POD #6 Continue OOB with PT Encourage PO will add Ensure  LOS: 7 days    Jamesetta So A 11/23/2021

## 2021-11-24 LAB — BASIC METABOLIC PANEL
Anion gap: 6 (ref 5–15)
BUN: 20 mg/dL (ref 8–23)
CO2: 28 mmol/L (ref 22–32)
Calcium: 9.1 mg/dL (ref 8.9–10.3)
Chloride: 107 mmol/L (ref 98–111)
Creatinine, Ser: 1.3 mg/dL — ABNORMAL HIGH (ref 0.44–1.00)
GFR, Estimated: 41 mL/min — ABNORMAL LOW (ref >60)
Glucose, Bld: 95 mg/dL (ref 70–99)
Potassium: 4.2 mmol/L (ref 3.5–5.1)
Sodium: 141 mmol/L (ref 135–145)

## 2021-11-24 LAB — CBC
HCT: 28.4 % — ABNORMAL LOW (ref 36.0–46.0)
Hemoglobin: 8.9 g/dL — ABNORMAL LOW (ref 12.0–15.0)
MCH: 29.4 pg (ref 26.0–34.0)
MCHC: 31.3 g/dL (ref 30.0–36.0)
MCV: 93.7 fL (ref 80.0–100.0)
Platelets: 256 10*3/uL (ref 150–400)
RBC: 3.03 MIL/uL — ABNORMAL LOW (ref 3.87–5.11)
RDW: 13.7 % (ref 11.5–15.5)
WBC: 5.9 10*3/uL (ref 4.0–10.5)
nRBC: 0 % (ref 0.0–0.2)

## 2021-11-24 NOTE — TOC Progression Note (Signed)
Transition of Care [facility]) - Progression Note    Patient Details  Name: Rebecca Gilmore MRN: [id number] Date of Birth: 06-18-1940  Transition of Care [facility]) CM/SW [place], RN Phone Number: 11/24/2021, 11:44 AM  Clinical Narrative:    TOC continues to follow the patient for DC planning and needs States she walked to bathroom independently with walker  [facility] accepted for [facility] PT   Expected Discharge Plan: [place] Barriers to Discharge: Continued Medical Work up  Expected Discharge Plan and Services Expected Discharge Plan: [place]   Discharge Planning Services: CM Consult Post Acute Care Choice: [place] arrangements for the past 2 months: Single Family Home                 DME Arranged: N/A DME Agency: NA       HH Arranged: PT, OT [place] Agency: [place] ([place]) Date HH Agency Contacted: 11/17/21 Time [place]: 1532 Representative spoke with at [place]: Corene Cornea   Social Determinants of Health ([place]) Interventions    Readmission Risk Interventions     No data to display

## 2021-11-24 NOTE — Progress Notes (Signed)
[facility]  Daily Progress Note   Subjective  -   Patient is in good spirits.  Agreeable for discharge.  Alert and oriented x4  Objective Vitals:   11/23/21 0737 11/23/21 1607 11/23/21 2323 11/24/21 0751  BP: (!) 135/48 (!) 126/48 (!) 140/55 (!) 151/35  Pulse: (!) 51 (!) 52 (!) 56 (!) 57  Resp: _0 Temp: 97.6 F (36.4 C) 97.7 F (36.5 C) 98.2 F (36.8 C) 97.8 F (36.6 C)  TempSrc: Oral   Oral  SpO2: 94% 96% 96% 95%  Weight:      Height:        Intake/Output Summary (Last 24 hours) at 11/24/2021 1506 Last data filed at 11/24/2021 0903 Gross per 24 hour  Intake --  Output 650 ml  Net -650 ml    PULM  CTAB CV  RRR VASC  2+ left pedal pulses  Laboratory CBC    Component Value Date/Time   WBC 5.9 11/24/2021 0828   HGB 8.9 (L) 11/24/2021 0828   HCT 28.4 (L) 11/24/2021 0828   PLT 256 11/24/2021 0828    BMET    Component Value Date/Time   NA 141 11/24/2021 0828   K 4.2 11/24/2021 0828   K 3.8 06/06/2012 1632   CL 107 11/24/2021 0828   CO2 28 11/24/2021 0828   GLUCOSE 95 11/24/2021 0828   BUN 20 11/24/2021 0828   CREATININE 1.30 (H) 11/24/2021 0828   CALCIUM 9.1 11/24/2021 0828   GFRNONAA 41 (L) 11/24/2021 0828   GFRAA 37 (L) 01/23/2019 1100    Assessment/Planning: POD #7 s/p left femoral artery thrombectomy  Patient excited and ready for discharge [facility] home health arranged We will plan on discharge tomorrow 09/19 Continue OOB with PT  Kris Hartmann  11/24/2021, 3:06 PM

## 2021-11-24 NOTE — Progress Notes (Cosign Needed Addendum)
Physical Therapy Treatment Patient Details Name: Rebecca Gilmore MRN: [id number] DOB: June 01, 1940 Today's Date: 11/24/2021   History of Present Illness Rebecca Gilmore is a 81 y.o. 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 Now s/p L iliofemoral TE w/ patch angioplasty on 11/16/21.    PT Comments    Pt in chair.  Stood with min a x 2 initially and able to walk 10' x 2 with RW and min a x 1.  After seated rest, is able to progress 30' in hallway with RW and min a x 1 with recliner follow.  Discussed discharge plan.  Pt wishes to return home.  Stated she has good family supports, and equipment needed at home.  Is not interested in SNF at this time.  Since mobility has progressed, will update discharge recommendations.   Recommendations for follow up therapy are one component of a multi-disciplinary discharge planning process, led by the attending physician.  Recommendations may be updated based on patient status, additional functional criteria and insurance authorization.  Follow Up Recommendations  Home health PT     Assistance Recommended at Discharge Frequent or constant Supervision/Assistance  Patient can return home with the following A little help with walking and/or transfers;Assistance with cooking/housework;Assist for transportation;Help with stairs or ramp for entrance;A little help with bathing/dressing/bathroom   Equipment Recommendations  None recommended by PT    Recommendations for Other Services       Precautions / Restrictions Precautions Precautions: Fall Restrictions Weight Bearing Restrictions: No     Mobility  Bed Mobility               General bed mobility comments: in chair before and after    Transfers Overall transfer level: Needs assistance Equipment used:  Rolling walker (2 wheels) Transfers: Sit to/from Stand Sit to Stand: Min assist, +2 safety/equipment                Ambulation/Gait Ambulation/Gait assistance: Min assist Gait Distance (Feet): 35 Feet Assistive device: Rolling walker (2 wheels) Gait Pattern/deviations: Step-through pattern, Decreased step length - right, Decreased step length - left, Trunk flexed Gait velocity: decr     General Gait Details: 10' x 2 and 30'  x 1 with seated rest breaks.   Stairs             Wheelchair Mobility    Modified Rankin (Stroke Patients Only)       Balance Overall balance assessment: Needs assistance Sitting-balance support: Bilateral upper extremity supported, Feet supported Sitting balance-Leahy Scale: Fair     Standing balance support: Bilateral upper extremity supported, During functional activity, Reliant on assistive device for balance Standing balance-Leahy Scale: Fair Standing balance comment: reliant on RW during standing                            Cognition Arousal/Alertness: Awake/alert Behavior During Therapy: WFL for tasks assessed/performed Overall Cognitive Status: Within Functional Limits for tasks assessed                                          Exercises Other Exercises Other Exercises: supine A/AAROM x 10    General Comments        Pertinent Vitals/Pain  Pain Assessment Pain Assessment: Faces Faces Pain Scale: Hurts a little bit Pain Location: BLEs Pain Descriptors / Indicators: Grimacing, Discomfort Pain Intervention(s): Limited activity within patient's tolerance, Monitored during session, Repositioned    Home Living                          Prior Function            PT Goals (current goals can now be found in the care plan section) Progress towards PT goals: Progressing toward goals    Frequency    Min 2X/week      PT Plan Discharge plan needs to be updated    Co-evaluation               AM-PAC PT "6 Clicks" Mobility   Outcome Measure  Help needed turning from your back to your side while in a flat bed without using bedrails?: A Little Help needed moving from lying on your back to sitting on the side of a flat bed without using bedrails?: A Little Help needed moving to and from a bed to a chair (including a wheelchair)?: A Little Help needed standing up from a chair using your arms (e.g., wheelchair or bedside chair)?: A Little Help needed to walk in hospital room?: A Little Help needed climbing 3-5 steps with a railing? : A Lot 6 Click Score: 17    End of Session Equipment Utilized During Treatment: Gait belt Activity Tolerance: Patient tolerated treatment well Patient left: in chair;with call bell/phone within reach Nurse Communication: Mobility status PT Visit Diagnosis: Unsteadiness on feet (R26.81);Muscle weakness (generalized) (M62.81)     Time: 2841-3244 PT Time Calculation (min) (ACUTE ONLY): 17 min  Charges:  $Gait Training: 8-22 mins                    Chesley Noon, PTA 11/24/21, 12:17 PM

## 2021-11-24 NOTE — Progress Notes (Signed)
Occupational Therapy Treatment Patient Details Name: Rebecca Gilmore MRN: [id number] DOB: 11-12-1940 Today's Date: 11/24/2021   History of present illness Rebecca Gilmore is a 81 y.o. 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 Now s/p L iliofemoral TE w/ patch angioplasty on 11/16/21.   OT comments  Pt in bed upon arrival and agreeable to OT treatment. Pt required min guard/supervision for bed mobility. Pt was able to transfer sit <> stand using RW with min A. Pt was able to ambulate ~10 ft to bathroom, requiring supervision to transfer to [facility], min A for peri-care. Pt was also able to perform grooming task (oral care, face washing) with min guard/supervision. Pt left in recliner with call bell in reach and all needs met.    Recommendations for follow up therapy are one component of a multi-disciplinary discharge planning process, led by the attending physician.  Recommendations may be updated based on patient status, additional functional criteria and insurance authorization.    Follow Up Recommendations  Skilled nursing-short term rehab (<3 hours/day)    Assistance Recommended at Discharge Frequent or constant Supervision/Assistance  Patient can return home with the following  Help with stairs or ramp for entrance;Assist for transportation;Direct supervision/assist for financial management;Direct supervision/assist for medications management;A little help with walking and/or transfers;A little help with bathing/dressing/bathroom   Equipment Recommendations  Other (comment) (Defer to next venue of care.)    Recommendations for Other Services      Precautions / Restrictions Precautions Precautions: Fall Restrictions Weight Bearing Restrictions: No       Mobility Bed Mobility Overal bed mobility:  Needs Assistance Bed Mobility: Supine to Sit     Supine to sit: Min assist          Transfers   Equipment used: Rolling walker (2 wheels) Transfers: Sit to/from Stand Sit to Stand: Min assist                 Balance Overall balance assessment: Needs assistance Sitting-balance support: Bilateral upper extremity supported, Feet supported Sitting balance-Leahy Scale: Fair     Standing balance support: Bilateral upper extremity supported, During functional activity, Reliant on assistive device for balance Standing balance-Leahy Scale: Fair Standing balance comment: reliant on RW during standing                           ADL either performed or assessed with clinical judgement   ADL Overall ADL's : Needs assistance/impaired     Grooming: Min guard                       Toileting- Clothing Manipulation and Hygiene: Minimal assistance Toileting - Clothing Manipulation Details (indicate cue type and reason): min A for peri-care            Extremity/Trunk Assessment Upper Extremity Assessment Upper Extremity Assessment: Generalized weakness   Lower Extremity Assessment Lower Extremity Assessment: Generalized weakness         Cognition Arousal/Alertness: Awake/alert Behavior During Therapy: WFL for tasks assessed/performed Overall Cognitive Status: Within Functional Limits for tasks assessed  Frequency  Min 2X/week        Progress Toward Goals  OT Goals(current goals can now be found in the care plan section)  Progress towards OT goals: Progressing toward goals  Acute Rehab OT Goals Patient Stated Goal: to return home. OT Goal Formulation: With patient Time For Goal Achievement: 12/01/21 Potential to Achieve Goals: Good  Plan Discharge plan remains appropriate;Frequency remains appropriate       AM-PAC OT "6 Clicks" Daily Activity     Outcome Measure    Help from another person eating meals?: None Help from another person taking care of personal grooming?: A Little Help from another person toileting, which includes using toliet, bedpan, or urinal?: A Little Help from another person bathing (including washing, rinsing, drying)?: A Lot Help from another person to put on and taking off regular upper body clothing?: A Little Help from another person to put on and taking off regular lower body clothing?: A Lot 6 Click Score: 17    End of Session Equipment Utilized During Treatment: Gait belt;Rolling walker (2 wheels)  OT Visit Diagnosis: Muscle weakness (generalized) (M62.81)   Activity Tolerance Patient tolerated treatment well   Patient Left with call bell/phone within reach;in chair   Nurse Communication Mobility status        Time: 4356-8616 OT Time Calculation (min): 26 min  Charges: OT General Charges $OT Visit: 1 Visit OT Treatments $Self Care/Home Management : 23-37 mins    Trammell Bowden, OTS 11/24/2021, 1:42 PM

## 2021-11-24 NOTE — Plan of Care (Signed)
  Problem: Clinical Measurements: Goal: Will remain free from infection Outcome: Progressing Goal: Cardiovascular complication will be avoided Outcome: Progressing   Problem: Activity: Goal: Risk for activity intolerance will decrease Outcome: Progressing   Problem: Nutrition: Goal: Adequate nutrition will be maintained Outcome: Progressing   Problem: Coping: Goal: Level of anxiety will decrease Outcome: Progressing   Problem: Pain Managment: Goal: General experience of comfort will improve Outcome: Progressing   Problem: Safety: Goal: Ability to remain free from injury will improve Outcome: Progressing   Problem: Skin Integrity: Goal: Risk for impaired skin integrity will decrease Outcome: Progressing

## 2021-11-25 MED ORDER — APIXABAN 5 MG PO TABS: 10.0000 mg | ORAL_TABLET | Freq: Two times a day (BID) | ORAL | 0 refills | Status: DC

## 2021-11-25 MED ORDER — APIXABAN 5 MG PO TABS: 5.0000 mg | ORAL_TABLET | Freq: Two times a day (BID) | ORAL | 3 refills | Status: DC

## 2021-11-25 MED ORDER — OXYCODONE-ACETAMINOPHEN 5-325 MG PO TABS: 1.0000 | ORAL_TABLET | Freq: Four times a day (QID) | ORAL | 0 refills | Status: DC | PRN

## 2021-11-25 MED ORDER — PREGABALIN 75 MG PO CAPS: 75.0000 mg | ORAL_CAPSULE | Freq: Every day | ORAL | 0 refills | Status: AC

## 2021-11-25 NOTE — Plan of Care (Signed)
  Problem: Clinical Measurements: Goal: Will remain free from infection Outcome: Progressing Goal: Cardiovascular complication will be avoided Outcome: Progressing   Problem: Activity: Goal: Risk for activity intolerance will decrease Outcome: Progressing   Problem: Nutrition: Goal: Adequate nutrition will be maintained Outcome: Progressing   Problem: Coping: Goal: Level of anxiety will decrease Outcome: Progressing

## 2021-11-25 NOTE — Progress Notes (Signed)
[facility]  Daily Progress Note   Subjective  -   Patient doing better.  Mobility is improving.  Ready to go home  Objective Vitals:   11/24/21 1842 11/24/21 2017 11/25/21 0603 11/25/21 0811  BP: (!) 156/54  (!) 157/48 (!) 156/69  Pulse: 61 62 63 64  Resp: _0 Temp: 98 F (36.7 C) 98.1 F (36.7 C) 97.6 F (36.4 C) 98.4 F (36.9 C)  TempSrc:   Oral   SpO2: 96% 99% 97% 95%  Weight:      Height:        Intake/Output Summary (Last 24 hours) at 11/25/2021 1213 Last data filed at 11/25/2021 0814 Gross per 24 hour  Intake 120 ml  Output 500 ml  Net -380 ml    PULM  CTAB CV  RRR VASC  Palpable pedal pulses bilaterally.  Laboratory CBC    Component Value Date/Time   WBC 5.9 11/24/2021 0828   HGB 8.9 (L) 11/24/2021 0828   HCT 28.4 (L) 11/24/2021 0828   PLT 256 11/24/2021 0828    BMET    Component Value Date/Time   NA 141 11/24/2021 0828   K 4.2 11/24/2021 0828   K 3.8 06/06/2012 1632   CL 107 11/24/2021 0828   CO2 28 11/24/2021 0828   GLUCOSE 95 11/24/2021 0828   BUN 20 11/24/2021 0828   CREATININE 1.30 (H) 11/24/2021 0828   CALCIUM 9.1 11/24/2021 0828   GFRNONAA 41 (L) 11/24/2021 0828   GFRAA 37 (L) 01/23/2019 1100    Assessment/Planning: POD #2 s/p left femoral thromboendarterectomy for acute LLE ischemia  Discharge home today Follow-up in the office in 3 to 4 weeks with ABIs   Leotis Pain  11/25/2021, 12:13 PM

## 2021-11-25 NOTE — Discharge Summary (Signed)
[facility]    Discharge Summary    Patient ID:  Rebecca Gilmore MRN: [id number] DOB/AGE: Aug 18, 1940 81 y.o.  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  The patient was found to have critical limb ischemia with an occluded left common femoral artery.  The patient underwent emergent thrombectomy  Hospital Course:  Rebecca Gilmore is a 81 y.o. female is S/P Left femoral endarterectomy with thrombectomy.  She tolerated the procedure well with notable foot and palpable pulses postprocedure.  However on approximately postoperative day 3 the patient began to exhibit signs of confusion with profound confusion by day 4.  The patient's daughter noted that the patient had an allergy to morphine and this was halted.  Patient was also noted to have UTI and this was treated.  We also restarted several of the patient's home medications.  Following these interventions the patient returned to her baseline status and is currently alert and oriented x4.  Initially there was discussion of rehab with the patient referred to return home with home health and  PT  Procedure(s): THROMBECTOMY FEMORAL ARTERY Extubated: POD # 0 Physical exam: 1+ DP left pulse.  Foot warm. Post-op wounds clean, dry, intact or healing well Pt. Ambulating, voiding and taking PO diet without difficulty. Pt pain controlled with PO pain meds. Labs as below Complications: Transient Confusion  Consults:    Significant Diagnostic Studies: CBC Lab Results  Component Value Date   WBC 5.9 11/24/2021   HGB 8.9 (L) 11/24/2021   HCT 28.4 (L) 11/24/2021   MCV 93.7 11/24/2021   PLT 256 11/24/2021    BMET    Component Value Date/Time   NA 141 11/24/2021 0828   K 4.2 11/24/2021 0828   K 3.8 06/06/2012 1632   CL 107 11/24/2021 0828   CO2 28 11/24/2021 0828   GLUCOSE 95 11/24/2021 0828   BUN 20 11/24/2021 0828   CREATININE 1.30 (H) 11/24/2021 0828   CALCIUM 9.1 11/24/2021 0828   GFRNONAA 41 (L) 11/24/2021 0828   GFRAA 37 (L) 01/23/2019 1100   COAG Lab Results  Component Value Date   INR 1.0 11/16/2021   INR 1.0 08/02/2008     Disposition:  Discharge to :Home  Allergies as of 11/25/2021       Reactions   Morphine And Related Itching   Kenalog [triamcinolone Acetonide] Other (See Comments)   FLUSH IN FACE   Penicillins Other (See Comments)   Has patient had a PCN reaction causing immediate rash, facial/tongue/throat swelling, SOB or lightheadedness with hypotension: Unknown Has patient had a PCN reaction causing severe rash involving mucus membranes or skin necrosis: Unknown  Has patient had a PCN reaction that required hospitalization:No Has patient had a PCN reaction occurring within the last 10 years: No If all of the above answers are "NO", then may proceed with Cephalosporin use.        Medication List     TAKE these medications    amitriptyline 25 MG tablet Commonly known as: ELAVIL Take 25 mg by mouth at bedtime.   apixaban 5 MG Tabs tablet Commonly known as: ELIQUIS Take 2 tablets (10 mg total) by mouth 2 (two) times daily.    apixaban 5 MG Tabs tablet Commonly known as: ELIQUIS Take 1 tablet (5 mg total) by mouth 2 (two) times daily. Start taking on: November 27, 2021   cholecalciferol 25 MCG (1000 UNIT) tablet Commonly known as: VITAMIN D3 Take 1,000 Units by mouth daily.   cyanocobalamin 1000 MCG tablet Commonly known as: VITAMIN B12 Take 1,000 mcg by mouth.   furosemide 40 MG tablet Commonly known as: LASIX Take 40 mg by mouth daily. For fluid retention (swollen ankles)   Iron 325 (65 Fe) MG Tabs Take 1 tablet by mouth daily.   loratadine 10 MG tablet Commonly known as: CLARITIN Take 10 mg by mouth daily.   metoprolol succinate 25 MG 24 hr tablet Commonly known as: TOPROL-XL Take 12.5 mg by mouth daily.   oxyCODONE-acetaminophen 5-325 MG tablet Commonly known as: PERCOCET/ROXICET Take 1 tablet by mouth every 6 (six) hours as needed for moderate pain.   pantoprazole 40 MG tablet Commonly known as: PROTONIX Take 40 mg by mouth every morning.   predniSONE 1 MG tablet Commonly known as: DELTASONE Take 2 mg by mouth daily.   pregabalin 75 MG capsule Commonly known as: Lyrica Take 1 capsule (75 mg total) by mouth daily.   rosuvastatin 5 MG tablet Commonly known as: CRESTOR TAKE 1 TABLET BY MOUTH AT BEDTIME FOR HIGH CHOLESTEROL   sertraline 50 MG tablet Commonly known as: ZOLOFT Take 25 mg by mouth daily.       Verbal and written Discharge instructions given to the patient. Wound care per Discharge AVS  Follow-up Information     Dew, Erskine Squibb, MD Follow up in 3 week(s).   Specialties: Vascular Surgery, Radiology, Interventional Cardiology Why: See JD/FB in 3-4 weeks with ABI Contact information: [place] [address] [phone]                 Signed: Kris Hartmann, NP  11/25/2021, 10:53 AM

## 2021-11-25 NOTE — Progress Notes (Signed)
OT Cancellation Note  Patient Details Name: Rebecca Gilmore MRN: [id number] DOB: 10-17-1940   Cancelled Treatment:    Reason Eval/Treat Not Completed: Patient declined, no reason specified. Pt supine in bed with RN in room. Pt refusing to allow therapist to remove purewick even though plan is for home. Pt also refusing to work with therapist until she eats breakfast. OT will re-attempt at next available time.   Darleen Crocker, MS, OTR/L , CBIS ascom [phone]  11/25/21, 9:12 AM

## 2021-11-25 NOTE — Care Management Important Message (Signed)
Important Message  Patient Details  Name: Rebecca Gilmore MRN: [id number] Date of Birth: 07-27-1940   Medicare Important Message Given:  Yes     Juliann Pulse A Alayna Mabe 11/25/2021, 10:51 AM

## 2021-11-28 ENCOUNTER — Telehealth (INDEPENDENT_AMBULATORY_CARE_PROVIDER_SITE_OTHER): Payer: Self-pay

## 2021-11-28 ENCOUNTER — Other Ambulatory Visit (INDEPENDENT_AMBULATORY_CARE_PROVIDER_SITE_OTHER): Payer: Self-pay | Admitting: Nurse Practitioner

## 2021-11-28 DIAGNOSIS — I70202 Unspecified atherosclerosis of native arteries of extremities, left leg: Secondary | ICD-10-CM

## 2021-11-28 NOTE — Telephone Encounter (Signed)
Patient's daughter called stated that the vac that was placed has stopped working. The daughter states the patient was released on Tuesday and the vac is in an odd place. The daughter also stated that they were told that PT and Gregory would be coming out and no one has contacted them. The daughter also stated the patient needs a f/u surgical appt. Please advise.

## 2021-11-28 NOTE — Telephone Encounter (Signed)
The patient should be seen in 1 to 2 weeks with an ABI to see Dew or myself.  The patient had a Prevena wound VAC placed and if it has stopped working that is okay (it isn'y meant to stay more than a week) you can place gauze over the area.  The home health was arranged by the hospital and it looks like it was with [facility], if we can reach out to them to see if they are able to see the patient today or soon.

## 2021-11-28 NOTE — Telephone Encounter (Signed)
Spoke with the patient's daughter and home health PT has been scheduled with [facility] and the patient will come in on 12/01/21 at 1:00 pm for a wound check in our office.

## 2021-12-01 ENCOUNTER — Encounter (INDEPENDENT_AMBULATORY_CARE_PROVIDER_SITE_OTHER): Payer: Self-pay

## 2021-12-01 ENCOUNTER — Ambulatory Visit (INDEPENDENT_AMBULATORY_CARE_PROVIDER_SITE_OTHER): Payer: Medicare Other | Admitting: Nurse Practitioner

## 2021-12-01 VITALS — BP 103/62 | HR 72 | Resp 16 | Wt 232.0 lb

## 2021-12-01 DIAGNOSIS — I70202 Unspecified atherosclerosis of native arteries of extremities, left leg: Secondary | ICD-10-CM

## 2021-12-01 NOTE — Progress Notes (Signed)
Patient came in for left groin wound check. The wound was cleaned with saline. I applied aquacel and 4x4 over wound. Patient will change wound dressing every other day and will follow up next week for wound check.

## 2021-12-09 ENCOUNTER — Encounter (INDEPENDENT_AMBULATORY_CARE_PROVIDER_SITE_OTHER): Payer: Self-pay | Admitting: Nurse Practitioner

## 2021-12-09 ENCOUNTER — Ambulatory Visit (INDEPENDENT_AMBULATORY_CARE_PROVIDER_SITE_OTHER): Payer: Medicare Other | Admitting: Nurse Practitioner

## 2021-12-09 VITALS — BP 118/53 | HR 59 | Resp 15

## 2021-12-09 DIAGNOSIS — I70202 Unspecified atherosclerosis of native arteries of extremities, left leg: Secondary | ICD-10-CM

## 2021-12-09 MED ORDER — DOXYCYCLINE HYCLATE 100 MG PO CAPS: 100.0000 mg | ORAL_CAPSULE | Freq: Two times a day (BID) | ORAL | 0 refills | Status: DC

## 2021-12-11 ENCOUNTER — Encounter: Payer: Self-pay | Admitting: Emergency Medicine

## 2021-12-11 ENCOUNTER — Emergency Department: Payer: Medicare Other

## 2021-12-11 ENCOUNTER — Other Ambulatory Visit: Payer: Self-pay

## 2021-12-11 ENCOUNTER — Inpatient Hospital Stay
Admission: EM | Admit: 2021-12-11 | Discharge: 2021-12-15 | DRG: 377 | Disposition: A | Discharge status: Home Health | Payer: Medicare Other | Attending: Osteopathic Medicine | Admitting: Osteopathic Medicine

## 2021-12-11 DIAGNOSIS — F32A Depression, unspecified: Secondary | ICD-10-CM | POA: Diagnosis not present

## 2021-12-11 DIAGNOSIS — Z880 Allergy status to penicillin: Secondary | ICD-10-CM

## 2021-12-11 DIAGNOSIS — I350 Nonrheumatic aortic (valve) stenosis: Secondary | ICD-10-CM | POA: Diagnosis not present

## 2021-12-11 DIAGNOSIS — K921 Melena: Secondary | ICD-10-CM | POA: Diagnosis not present

## 2021-12-11 DIAGNOSIS — I5A Non-ischemic myocardial injury (non-traumatic): Secondary | ICD-10-CM | POA: Diagnosis not present

## 2021-12-11 DIAGNOSIS — R0789 Other chest pain: Secondary | ICD-10-CM | POA: Diagnosis not present

## 2021-12-11 DIAGNOSIS — Y838 Other surgical procedures as the cause of abnormal reaction of the patient, or of later complication, without mention of misadventure at the time of the procedure: Secondary | ICD-10-CM | POA: Diagnosis present

## 2021-12-11 DIAGNOSIS — Z7952 Long term (current) use of systemic steroids: Secondary | ICD-10-CM

## 2021-12-11 DIAGNOSIS — R578 Other shock: Secondary | ICD-10-CM | POA: Diagnosis present

## 2021-12-11 DIAGNOSIS — D649 Anemia, unspecified: Secondary | ICD-10-CM

## 2021-12-11 DIAGNOSIS — Z888 Allergy status to other drugs, medicaments and biological substances status: Secondary | ICD-10-CM

## 2021-12-11 DIAGNOSIS — I480 Paroxysmal atrial fibrillation: Secondary | ICD-10-CM | POA: Diagnosis not present

## 2021-12-11 DIAGNOSIS — R079 Chest pain, unspecified: Secondary | ICD-10-CM | POA: Diagnosis not present

## 2021-12-11 DIAGNOSIS — R32 Unspecified urinary incontinence: Secondary | ICD-10-CM | POA: Diagnosis present

## 2021-12-11 DIAGNOSIS — T8141XA Infection following a procedure, superficial incisional surgical site, initial encounter: Secondary | ICD-10-CM | POA: Diagnosis present

## 2021-12-11 DIAGNOSIS — K219 Gastro-esophageal reflux disease without esophagitis: Secondary | ICD-10-CM | POA: Diagnosis not present

## 2021-12-11 DIAGNOSIS — R0602 Shortness of breath: Secondary | ICD-10-CM | POA: Diagnosis not present

## 2021-12-11 DIAGNOSIS — N179 Acute kidney failure, unspecified: Secondary | ICD-10-CM | POA: Diagnosis not present

## 2021-12-11 DIAGNOSIS — D509 Iron deficiency anemia, unspecified: Secondary | ICD-10-CM | POA: Diagnosis present

## 2021-12-11 DIAGNOSIS — Z87891 Personal history of nicotine dependence: Secondary | ICD-10-CM

## 2021-12-11 DIAGNOSIS — K2900 Acute gastritis without bleeding: Secondary | ICD-10-CM | POA: Diagnosis not present

## 2021-12-11 DIAGNOSIS — Z6841 Body Mass Index (BMI) 40.0 and over, adult: Secondary | ICD-10-CM

## 2021-12-11 DIAGNOSIS — Z79899 Other long term (current) drug therapy: Secondary | ICD-10-CM

## 2021-12-11 DIAGNOSIS — K922 Gastrointestinal hemorrhage, unspecified: Secondary | ICD-10-CM | POA: Diagnosis not present

## 2021-12-11 DIAGNOSIS — I70202 Unspecified atherosclerosis of native arteries of extremities, left leg: Secondary | ICD-10-CM | POA: Diagnosis present

## 2021-12-11 DIAGNOSIS — I4510 Unspecified right bundle-branch block: Secondary | ICD-10-CM | POA: Diagnosis present

## 2021-12-11 DIAGNOSIS — D500 Iron deficiency anemia secondary to blood loss (chronic): Secondary | ICD-10-CM | POA: Diagnosis not present

## 2021-12-11 DIAGNOSIS — E785 Hyperlipidemia, unspecified: Secondary | ICD-10-CM | POA: Diagnosis present

## 2021-12-11 DIAGNOSIS — I214 Non-ST elevation (NSTEMI) myocardial infarction: Secondary | ICD-10-CM | POA: Diagnosis not present

## 2021-12-11 DIAGNOSIS — R7989 Other specified abnormal findings of blood chemistry: Secondary | ICD-10-CM | POA: Diagnosis not present

## 2021-12-11 DIAGNOSIS — I4891 Unspecified atrial fibrillation: Secondary | ICD-10-CM | POA: Diagnosis not present

## 2021-12-11 DIAGNOSIS — N1832 Chronic kidney disease, stage 3b: Secondary | ICD-10-CM | POA: Diagnosis present

## 2021-12-11 DIAGNOSIS — I252 Old myocardial infarction: Secondary | ICD-10-CM

## 2021-12-11 DIAGNOSIS — K449 Diaphragmatic hernia without obstruction or gangrene: Secondary | ICD-10-CM | POA: Diagnosis not present

## 2021-12-11 DIAGNOSIS — M316 Other giant cell arteritis: Secondary | ICD-10-CM | POA: Diagnosis present

## 2021-12-11 DIAGNOSIS — D62 Acute posthemorrhagic anemia: Secondary | ICD-10-CM | POA: Diagnosis not present

## 2021-12-11 DIAGNOSIS — I351 Nonrheumatic aortic (valve) insufficiency: Secondary | ICD-10-CM | POA: Diagnosis not present

## 2021-12-11 DIAGNOSIS — I5032 Chronic diastolic (congestive) heart failure: Secondary | ICD-10-CM | POA: Diagnosis present

## 2021-12-11 DIAGNOSIS — I482 Chronic atrial fibrillation, unspecified: Secondary | ICD-10-CM | POA: Diagnosis not present

## 2021-12-11 DIAGNOSIS — K2970 Gastritis, unspecified, without bleeding: Secondary | ICD-10-CM | POA: Diagnosis not present

## 2021-12-11 DIAGNOSIS — I4892 Unspecified atrial flutter: Secondary | ICD-10-CM | POA: Diagnosis present

## 2021-12-11 DIAGNOSIS — R531 Weakness: Secondary | ICD-10-CM | POA: Diagnosis not present

## 2021-12-11 DIAGNOSIS — M7989 Other specified soft tissue disorders: Secondary | ICD-10-CM | POA: Diagnosis present

## 2021-12-11 DIAGNOSIS — K2960 Other gastritis without bleeding: Secondary | ICD-10-CM | POA: Diagnosis not present

## 2021-12-11 DIAGNOSIS — M810 Age-related osteoporosis without current pathological fracture: Secondary | ICD-10-CM | POA: Diagnosis present

## 2021-12-11 DIAGNOSIS — Z8249 Family history of ischemic heart disease and other diseases of the circulatory system: Secondary | ICD-10-CM

## 2021-12-11 DIAGNOSIS — I2489 Other forms of acute ischemic heart disease: Secondary | ICD-10-CM | POA: Diagnosis not present

## 2021-12-11 DIAGNOSIS — I25119 Atherosclerotic heart disease of native coronary artery with unspecified angina pectoris: Secondary | ICD-10-CM | POA: Diagnosis not present

## 2021-12-11 DIAGNOSIS — Z7901 Long term (current) use of anticoagulants: Secondary | ICD-10-CM

## 2021-12-11 DIAGNOSIS — K5790 Diverticulosis of intestine, part unspecified, without perforation or abscess without bleeding: Secondary | ICD-10-CM | POA: Diagnosis not present

## 2021-12-11 DIAGNOSIS — I2510 Atherosclerotic heart disease of native coronary artery without angina pectoris: Secondary | ICD-10-CM | POA: Diagnosis not present

## 2021-12-11 DIAGNOSIS — K311 Adult hypertrophic pyloric stenosis: Secondary | ICD-10-CM | POA: Diagnosis present

## 2021-12-11 DIAGNOSIS — I130 Hypertensive heart and chronic kidney disease with heart failure and stage 1 through stage 4 chronic kidney disease, or unspecified chronic kidney disease: Secondary | ICD-10-CM | POA: Diagnosis present

## 2021-12-11 DIAGNOSIS — Z885 Allergy status to narcotic agent status: Secondary | ICD-10-CM

## 2021-12-11 LAB — CBC
HCT: 19.3 % — ABNORMAL LOW (ref 36.0–46.0)
HCT: 18.7 % — ABNORMAL LOW (ref 36.0–46.0)
HCT: 16.6 % — ABNORMAL LOW (ref 36.0–46.0)
HCT: 23.5 % — ABNORMAL LOW (ref 36.0–46.0)
Hemoglobin: 5.7 g/dL — ABNORMAL LOW (ref 12.0–15.0)
Hemoglobin: 5.6 g/dL — ABNORMAL LOW (ref 12.0–15.0)
Hemoglobin: 4.9 g/dL — CL (ref 12.0–15.0)
Hemoglobin: 7.3 g/dL — ABNORMAL LOW (ref 12.0–15.0)
MCH: 30.8 pg (ref 26.0–34.0)
MCH: 30.3 pg (ref 26.0–34.0)
MCH: 30.4 pg (ref 26.0–34.0)
MCH: 29 pg (ref 26.0–34.0)
MCHC: 29.5 g/dL — ABNORMAL LOW (ref 30.0–36.0)
MCHC: 29.9 g/dL — ABNORMAL LOW (ref 30.0–36.0)
MCHC: 29.5 g/dL — ABNORMAL LOW (ref 30.0–36.0)
MCHC: 31.1 g/dL (ref 30.0–36.0)
MCV: 104.3 fL — ABNORMAL HIGH (ref 80.0–100.0)
MCV: 101.1 fL — ABNORMAL HIGH (ref 80.0–100.0)
MCV: 103.1 fL — ABNORMAL HIGH (ref 80.0–100.0)
MCV: 93.3 fL (ref 80.0–100.0)
Platelets: 212 10*3/uL (ref 150–400)
Platelets: 188 10*3/uL (ref 150–400)
Platelets: 162 10*3/uL (ref 150–400)
Platelets: 172 10*3/uL (ref 150–400)
RBC: 1.85 MIL/uL — ABNORMAL LOW (ref 3.87–5.11)
RBC: 1.85 MIL/uL — ABNORMAL LOW (ref 3.87–5.11)
RBC: 1.61 MIL/uL — ABNORMAL LOW (ref 3.87–5.11)
RBC: 2.52 MIL/uL — ABNORMAL LOW (ref 3.87–5.11)
RDW: 18.2 % — ABNORMAL HIGH (ref 11.5–15.5)
RDW: 18 % — ABNORMAL HIGH (ref 11.5–15.5)
RDW: 18.2 % — ABNORMAL HIGH (ref 11.5–15.5)
RDW: 20.1 % — ABNORMAL HIGH (ref 11.5–15.5)
WBC: 7.2 10*3/uL (ref 4.0–10.5)
WBC: 7.8 10*3/uL (ref 4.0–10.5)
WBC: 7.2 10*3/uL (ref 4.0–10.5)
WBC: 7.1 10*3/uL (ref 4.0–10.5)
nRBC: 0 % (ref 0.0–0.2)
nRBC: 0 % (ref 0.0–0.2)
nRBC: 0 % (ref 0.0–0.2)
nRBC: 0 % (ref 0.0–0.2)

## 2021-12-11 LAB — PROTIME-INR
INR: 1.8 — ABNORMAL HIGH (ref 0.8–1.2)
Prothrombin Time: 20.4 seconds — ABNORMAL HIGH (ref 11.4–15.2)

## 2021-12-11 LAB — BASIC METABOLIC PANEL
Anion gap: 10 (ref 5–15)
BUN: 41 mg/dL — ABNORMAL HIGH (ref 8–23)
CO2: 26 mmol/L (ref 22–32)
Calcium: 9.2 mg/dL (ref 8.9–10.3)
Chloride: 102 mmol/L (ref 98–111)
Creatinine, Ser: 1.8 mg/dL — ABNORMAL HIGH (ref 0.44–1.00)
GFR, Estimated: 28 mL/min — ABNORMAL LOW (ref >60)
Glucose, Bld: 97 mg/dL (ref 70–99)
Potassium: 4.2 mmol/L (ref 3.5–5.1)
Sodium: 138 mmol/L (ref 135–145)

## 2021-12-11 LAB — RETICULOCYTES
Immature Retic Fract: 37 % — ABNORMAL HIGH (ref 2.3–15.9)
RBC.: 1.8 MIL/uL — ABNORMAL LOW (ref 3.87–5.11)
Retic Count, Absolute: 175.9 10*3/uL (ref 19.0–186.0)
Retic Ct Pct: 9.8 % — ABNORMAL HIGH (ref 0.4–3.1)

## 2021-12-11 LAB — VITAMIN B12: Vitamin B-12: 910 pg/mL (ref 180–914)

## 2021-12-11 LAB — IRON AND TIBC
Iron: 56 ug/dL (ref 28–170)
Saturation Ratios: 17 % (ref 10.4–31.8)
TIBC: 322 ug/dL (ref 250–450)
UIBC: 266 ug/dL

## 2021-12-11 LAB — TROPONIN I (HIGH SENSITIVITY)
Troponin I (High Sensitivity): 42 ng/L — ABNORMAL HIGH (ref <18)
Troponin I (High Sensitivity): 37 ng/L — ABNORMAL HIGH (ref <18)
Troponin I (High Sensitivity): 37 ng/L — ABNORMAL HIGH (ref <18)

## 2021-12-11 LAB — PREPARE RBC (CROSSMATCH)

## 2021-12-11 LAB — APTT: aPTT: 35 seconds (ref 24–36)

## 2021-12-11 LAB — FOLATE: Folate: 9.4 ng/mL (ref >5.9)

## 2021-12-11 LAB — HEMOGLOBIN A1C
Hgb A1c MFr Bld: 4.1 % — ABNORMAL LOW (ref 4.8–5.6)
Mean Plasma Glucose: 70.97 mg/dL

## 2021-12-11 LAB — HEMOGLOBIN AND HEMATOCRIT, BLOOD
HCT: 26.4 % — ABNORMAL LOW (ref 36.0–46.0)
Hemoglobin: 8.6 g/dL — ABNORMAL LOW (ref 12.0–15.0)

## 2021-12-11 LAB — BRAIN NATRIURETIC PEPTIDE: B Natriuretic Peptide: 262.4 pg/mL — ABNORMAL HIGH (ref 0.0–100.0)

## 2021-12-11 LAB — FERRITIN: Ferritin: 20 ng/mL (ref 11–307)

## 2021-12-11 MED ORDER — FUROSEMIDE 10 MG/ML IJ SOLN: 40.0000 mg | Freq: Once | INTRAMUSCULAR | Status: DC

## 2021-12-11 MED ORDER — SERTRALINE HCL 50 MG PO TABS
25.0000 mg | ORAL_TABLET | Freq: Every day | ORAL | Status: DC
  Administered 2021-12-11 – 2021-12-14 (×4): 25 mg via ORAL
  Filled 2021-12-11 (×4): qty 1

## 2021-12-11 MED ORDER — PREDNISONE 1 MG PO TABS
2.0000 mg | ORAL_TABLET | Freq: Every day | ORAL | Status: DC
  Administered 2021-12-11 – 2021-12-14 (×4): 2 mg via ORAL
  Filled 2021-12-11 (×5): qty 2

## 2021-12-11 MED ORDER — SODIUM CHLORIDE 0.9% IV SOLUTION: Freq: Once | INTRAVENOUS | Status: AC

## 2021-12-11 MED ORDER — LACTATED RINGERS IV BOLUS
1000.0000 mL | Freq: Once | INTRAVENOUS | Status: AC
  Administered 2021-12-11: 1000 mL via INTRAVENOUS

## 2021-12-11 MED ORDER — VITAMIN B-12 1000 MCG PO TABS
1000.0000 ug | ORAL_TABLET | Freq: Every day | ORAL | Status: DC
  Administered 2021-12-12 – 2021-12-14 (×3): 1000 ug via ORAL
  Filled 2021-12-11 (×3): qty 1

## 2021-12-11 MED ORDER — PANTOPRAZOLE SODIUM 40 MG IV SOLR
40.0000 mg | Freq: Once | INTRAVENOUS | Status: AC
  Administered 2021-12-11: 40 mg via INTRAVENOUS
  Filled 2021-12-11: qty 10

## 2021-12-11 MED ORDER — PREGABALIN 75 MG PO CAPS
75.0000 mg | ORAL_CAPSULE | Freq: Every day | ORAL | Status: DC
  Administered 2021-12-11 – 2021-12-14 (×4): 75 mg via ORAL
  Filled 2021-12-11 (×4): qty 1

## 2021-12-11 MED ORDER — LORATADINE 10 MG PO TABS: 10.0000 mg | ORAL_TABLET | Freq: Every day | ORAL | Status: DC | PRN

## 2021-12-11 MED ORDER — ACETAMINOPHEN 325 MG PO TABS: 650.0000 mg | ORAL_TABLET | Freq: Four times a day (QID) | ORAL | Status: DC | PRN

## 2021-12-11 MED ORDER — ONDANSETRON HCL 4 MG/2ML IJ SOLN: 4.0000 mg | Freq: Three times a day (TID) | INTRAMUSCULAR | Status: DC | PRN

## 2021-12-11 MED ORDER — AMITRIPTYLINE HCL 25 MG PO TABS
25.0000 mg | ORAL_TABLET | Freq: Every day | ORAL | Status: DC
  Administered 2021-12-12 – 2021-12-14 (×2): 25 mg via ORAL
  Filled 2021-12-11 (×3): qty 1

## 2021-12-11 MED ORDER — FERROUS SULFATE 325 (65 FE) MG PO TABS
325.0000 mg | ORAL_TABLET | Freq: Every day | ORAL | Status: DC
  Administered 2021-12-11 – 2021-12-15 (×5): 325 mg via ORAL
  Filled 2021-12-11 (×5): qty 1

## 2021-12-11 MED ORDER — AMITRIPTYLINE HCL 50 MG PO TABS
25.0000 mg | ORAL_TABLET | Freq: Every day | ORAL | Status: DC
  Filled 2021-12-11: qty 1

## 2021-12-11 MED ORDER — METOPROLOL SUCCINATE ER 25 MG PO TB24
12.5000 mg | ORAL_TABLET | Freq: Every day | ORAL | Status: DC
  Administered 2021-12-12: 12.5 mg via ORAL
  Filled 2021-12-11: qty 0.5
  Filled 2021-12-11: qty 1

## 2021-12-11 MED ORDER — PANTOPRAZOLE SODIUM 40 MG IV SOLR
40.0000 mg | Freq: Two times a day (BID) | INTRAVENOUS | Status: DC
  Administered 2021-12-11 – 2021-12-15 (×8): 40 mg via INTRAVENOUS
  Filled 2021-12-11 (×8): qty 10

## 2021-12-11 MED ORDER — ROSUVASTATIN CALCIUM 10 MG PO TABS
5.0000 mg | ORAL_TABLET | Freq: Every day | ORAL | Status: DC
  Administered 2021-12-11 – 2021-12-14 (×4): 5 mg via ORAL
  Filled 2021-12-11 (×4): qty 1

## 2021-12-11 MED ORDER — DOXYCYCLINE HYCLATE 100 MG PO TABS
100.0000 mg | ORAL_TABLET | Freq: Two times a day (BID) | ORAL | Status: DC
  Administered 2021-12-11 – 2021-12-14 (×8): 100 mg via ORAL
  Filled 2021-12-11 (×8): qty 1

## 2021-12-11 MED ORDER — VITAMIN D 25 MCG (1000 UNIT) PO TABS
1000.0000 [IU] | ORAL_TABLET | Freq: Every day | ORAL | Status: DC
  Administered 2021-12-12 – 2021-12-14 (×3): 1000 [IU] via ORAL
  Filled 2021-12-11 (×3): qty 1

## 2021-12-11 MED ORDER — FENTANYL CITRATE PF 50 MCG/ML IJ SOSY: 12.5000 ug | PREFILLED_SYRINGE | INTRAMUSCULAR | Status: DC | PRN

## 2021-12-11 MED ORDER — SODIUM CHLORIDE 0.9% IV SOLUTION
Freq: Once | INTRAVENOUS | Status: AC
  Filled 2021-12-11: qty 250

## 2021-12-11 MED ORDER — SODIUM CHLORIDE 0.9 % IV SOLN: 10.0000 mL/h | Freq: Once | INTRAVENOUS | Status: AC

## 2021-12-11 MED ORDER — HYDRALAZINE HCL 20 MG/ML IJ SOLN: 5.0000 mg | INTRAMUSCULAR | Status: DC | PRN

## 2021-12-11 NOTE — Assessment & Plan Note (Signed)
-  Crestor

## 2021-12-11 NOTE — Assessment & Plan Note (Signed)
-  see above

## 2021-12-11 NOTE — ED Notes (Signed)
3rd EKG obtained and given to Charna Archer, MD

## 2021-12-11 NOTE — Assessment & Plan Note (Signed)
Baseline creatinine 1.30 on 11/24/2021.  Her creatinine is 1.8, BUN 41, GFR 28. Likely due to volume depletion and continuation of diuretics -Hold Lasix

## 2021-12-11 NOTE — Assessment & Plan Note (Addendum)
GI bleeding, acute blood loss anemia and history of iron deficiency anemia: Hemoglobin dropped from 8.9 --> 5.7 --> 4.9.  Initially hypotensive which responded to IV fluid resuscitation.  Started to Dr. Virgina Jock of GI.  - will admitted to tele bed as inpatient - transfuse 2 units of blood now - IVF: 1L LR bolus - Start IV pantoprazole 40 mg bid - Zofran IV for nausea - Avoid NSAIDs and SQ heparin - Maintain IV access (2 large bore IVs if possible). - Monitor closely and follow q6h cbc, transfuse as necessary, if Hgb<7.0 - LaB: INR, PTT and type screen, anemia panel -Continue iron supplement -hold Eliquis

## 2021-12-11 NOTE — ED Provider Notes (Addendum)
[facility] Provider Note    Event Date/Time   First MD Initiated Contact with Patient 12/11/21 [phone]     (approximate)   History   Chief Complaint Leg Swelling and Chest Pain   HPI  Rebecca Gilmore is a 81 y.o. female with past medical history of hypertension, GERD, PAD, atrial fibrillation on Eliquis, CKD and depression who presents to the ED complaining of leg swelling and chest pain.  Patient reports that she has been dealing with increasing swelling in both of her legs for about the past week, despite taking her usual Lasix.  She states she has also been dealing with sharp pain in her chest whenever she goes to lay down for bed at night, denies any chest pain currently.  She reports that she has been feeling weaker than usual, but denies any fevers, cough, or shortness of breath.  She states her left groin wound seems to be healing following femoral thrombectomy, denies any pain in either leg.  She continues to take Eliquis for atrial fibrillation, has not noticed any bleeding but does report her stool has been dark chronically.     Physical Exam   Triage Vital Signs: ED Triage Vitals  Enc Vitals Group     BP 12/11/21 0718 (!) 120/33     Pulse Rate 12/11/21 0718 70     Resp 12/11/21 0718 18     Temp 12/11/21 0718 98 F (36.7 C)     Temp src --      SpO2 12/11/21 0718 97 %     Weight 12/11/21 0714 235 lb (106.6 kg)     Height 12/11/21 0714 _0  (1.626 m)     Head Circumference --      Peak Flow --      Pain Score 12/11/21 0713 3     Pain Loc --      Pain Edu? --      Excl. in [place]? --     Most recent vital signs: Vitals:   12/11/21 0718  BP: (!) 120/33  Pulse: 70  Resp: 18  Temp: 98 F (36.7 C)  SpO2: 97%    Constitutional: Alert and oriented. Eyes: Conjunctivae are normal. Head: Atraumatic. Nose: No congestion/rhinnorhea. Mouth/Throat: Mucous membranes are moist.  Cardiovascular: Normal rate, regular rhythm. Grossly normal heart  sounds.  2+ radial and DP pulses bilaterally. Respiratory: Normal respiratory effort.  No retractions. Lungs CTAB. Gastrointestinal: Soft and nontender. No distention.  Rectal exam with dark brown guaiac positive stool. Musculoskeletal: No lower extremity tenderness, 1+ pitting edema to knees bilaterally.  Healing wound to left femoral access site with no erythema, tenderness, or drainage. Neurologic:  Normal speech and language. No gross focal neurologic deficits are appreciated.    ED Results / Procedures / Treatments   Labs (all labs ordered are listed, but only abnormal results are displayed) Labs Reviewed  BASIC METABOLIC PANEL - Abnormal; Notable for the following components:      Result Value   BUN 41 (*)    Creatinine, Ser 1.80 (*)    GFR, Estimated 28 (*)    All other components within normal limits  CBC - Abnormal; Notable for the following components:   RBC 1.85 (*)    Hemoglobin 5.7 (*)    HCT 19.3 (*)    MCV 104.3 (*)    MCHC 29.5 (*)    RDW 18.2 (*)    All other components within normal limits  TROPONIN I (HIGH  SENSITIVITY) - Abnormal; Notable for the following components:   Troponin I (High Sensitivity) 42 (*)    All other components within normal limits  TYPE AND SCREEN  PREPARE RBC (CROSSMATCH)  TYPE AND SCREEN     EKG  ED ECG REPORT I, Blake Divine, the attending physician, personally viewed and interpreted this ECG.   Date: 12/11/2021  EKG Time: 7:18  Rate: 64  Rhythm: normal sinus rhythm  Axis: Normal  Intervals:right bundle branch block  ST&T Change: Normal  ED ECG REPORT I, Blake Divine, the attending physician, personally viewed and interpreted this ECG.   Date: 12/11/2021  EKG Time: 9:26  Rate: 65  Rhythm: normal sinus rhythm  Axis: Normal  Intervals:right bundle branch block and left anterior fascicular block  ST&T Change: ST elevation in aVR with diffuse ST depressions   RADIOLOGY Chest x-ray reviewed and interpreted by me  with no infiltrate, edema, or effusion.  PROCEDURES:  Critical Care performed: Yes, see critical care procedure note(s)  .Critical Care  Performed by: Blake Divine, MD Authorized by: Blake Divine, MD   Critical care provider statement:    Critical care time (minutes):  45   Critical care time was exclusive of:  Separately billable procedures and treating other patients and teaching time   Critical care was necessary to treat or prevent imminent or life-threatening deterioration of the following conditions: Anemia requiring transfusion.   Critical care was time spent personally by me on the following activities:  Development of treatment plan with patient or surrogate, discussions with consultants, evaluation of patient's response to treatment, examination of patient, ordering and review of laboratory studies, ordering and review of radiographic studies, ordering and performing treatments and interventions, pulse oximetry, re-evaluation of patient's condition and review of old charts   I assumed direction of critical care for this patient from another provider in my specialty: no     Care discussed with: admitting provider      MEDICATIONS ORDERED IN ED: Medications  pantoprazole (PROTONIX) injection 40 mg (has no administration in time range)  0.9 %  sodium chloride infusion (has no administration in time range)     IMPRESSION / MDM / [place] / ED COURSE  I reviewed the triage vital signs and the nursing notes.                              81 y.o. female with past medical history of hypertension, PAD, atrial fibrillation on Eliquis, CKD, GERD, depression who presents to the ED complaining of increasing leg swelling for the past week with intermittent chest pain and generalized weakness.  Patient's presentation is most consistent with acute presentation with potential threat to life or bodily function.  Differential diagnosis includes, but is not limited to, ACS,  CHF, DVT, arterial insufficiency, venous insufficiency, AKI, electrolyte abnormality.  Patient chronically ill-appearing but in no acute distress, vital signs are unremarkable.  She has 1+ pitting edema to her bilateral lower extremities, neurovascularly intact with 2+ DP pulses.  No findings to suggest DVT or infectious process, left groin wound is healing appropriately.  Labs remarkable for mild AKI without significant electrolyte abnormality, chest x-ray without pulmonary edema.  Troponin mildly elevated but she denies any chest pain currently and I have low suspicion for ACS or PE.  She does have significant acute on chronic anemia with hemoglobin of 5.7 today, no significant leukocytosis or thrombocytopenia noted.  We will transfuse 2  units PRBCs as some of her symptoms are likely related to anemia.  Rectal exam with guaiac positive stool and I am concerned for GI bleeding, especially given she is on Eliquis.  We will give dose of IV Protonix and case discussed with hospitalist for admission.  ----------------------------------------- 9:39 AM on 12/11/2021 ----------------------------------------- Following discussion with the hospitalist, patient began complaining of severe onset pain in the center of her chest radiating down her left arm.  Repeat EKG shows elevation in aVR with diffuse ST depressions concerning for global ischemia but does not meet STEMI criteria.  Blood pressure was briefly low but improved following IV fluid bolus, plan to discuss with interventional cardiology.  ----------------------------------------- 9:52 AM on 12/11/2021 ----------------------------------------- Case discussed with Dr. Ellyn Hack of interventional cardiology, who states that patient unlikely to benefit from cardiac catheterization at this time, EKG most consistent with global ischemia, agrees with plan to proceed with emergent transfusion as anemia could be contributing to her chest pain.  Hospitalist updated  on events since our prior discussion.      FINAL CLINICAL IMPRESSION(S) / ED DIAGNOSES   Final diagnoses:  Chest pain, unspecified type  Anemia, unspecified type  Generalized weakness     Rx / DC Orders   ED Discharge Orders     None        Note:  This document was prepared using Dragon voice recognition software and may include unintentional dictation errors.   Blake Divine, MD 12/11/21 1638    Blake Divine, MD 12/11/21 [phone]

## 2021-12-11 NOTE — Assessment & Plan Note (Signed)
-  Continue home medications 

## 2021-12-11 NOTE — Consult Note (Addendum)
[place] NOTE       Patient ID: Rebecca Gilmore MRN: [id number] DOB/AGE: 09/30/1940 81 y.o.  Admit date: 12/11/2021 Referring Physician Dr. Blaine Hamper Primary Physician  Primary Cardiologist Dr. 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  Cardiology is consulted for further assistance.  The patient presents with a primary complaint of bilateral feet swelling and difficulty walking over the past week, worsening over the past 2 days.  She typically swells in her legs with "high humidity" but notes she has not been outside recently to experience this.  Her peripheral edema usually improves with elevation of her legs and with her Lasix daily, however it has persisted despite this.  She notes that she has had broccoli cheddar soup several times this week and acknowledges the high salt content.  She also notes occasional chest discomfort she describes as a sharp pain generalized across the center of her chest radiating to the left side and down both of her arms with associated generalized weakness in her arms.  These episodes have occurred intermittently over the past several weeks since her endarterectomy, last several minutes at a time and resolve on their own with sitting still.  It is somewhat worsened with laying  flat, also improved on one instance with Aspercreme application.  It is nonexertional, not reproducible.  Not associated with nausea, shortness of breath, or diaphoresis.  She has never had chest discomfort like this before, denies similar symptoms during her NSTEMI in 2022.  Most recently had chest pain in the ED prior to transfusion of PRBCs, lasted several minutes and terminated on its own.  Other cardiac review of systems is negative for orthopnea (actually sleeps flat at night), dyspnea on exertion, palpitations, dizziness.  Notably, she has had issues with GI bleeding in the past, most recently in January 2023 during her hospitalization at [facility] for urosepsis, complicated by atrial fibrillation and other medical issues.  She has been closely followed by cardiology, and with shared decision making, the patient and her daughter elected not to restart Eliquis for anticoagulation due to this history and no recurrence of atrial fibrillation on Holter monitor, initial onset in the setting of severe sepsis.  She was restarted on Eliquis for an alternate reason after her endarterectomy by vascular surgery.  Recent vitals are notable for initial hypertension with blood pressure documented as low as 94/47, improving to 106/62 after unit of IV fluids and PRBCs., heart rate in the 60s to 70s in sinus rhythm with an RBBB on telemetry.  SPO2 100% on room air.  EKG shows sinus rhythm with a known RBBB with slight ST depression anterolaterally compared to prior 9/10.    labs are notable for an initial hemoglobin of 5.7, downtrending over 2 hours to 4.9 and hematocrit 16.6.  Platelets 162.  (Baseline from 3 weeks ago 8.9 and 28.  BUN/creatinine 41/1.8 and GFR of 28 (  recent baseline from 3 weeks ago was 1.3 and 41) high-sensitivity troponin minimally elevated with a flat trend at 42-37.  BNP minimally elevated at 262.    Review of systems complete and found to be negative unless listed above     Past Medical History:   Diagnosis Date   Depression    HBP (high blood pressure)    History of bladder problems    Memory loss    Muscle pain    Osteoporosis    Reflux    Sinus congestion    Swelling    B/L FEET AND LEGS    Past Surgical History:  Procedure Laterality Date   ARTERY BIOPSY Bilateral 12/01/2016   Procedure: BIOPSY TEMPORAL ARTERY;  Surgeon: Conrad Florence, MD;  Location: [facility] OR;  Service: Vascular;  Laterality: Bilateral;   CATARACT SURGERY     EYELID SURGERY Bilateral    GROWTH ON FACE     KNEE SURGERY Right    THROMBECTOMY FEMORAL ARTERY Left 11/16/2021   Procedure: THROMBECTOMY FEMORAL ARTERY;  Surgeon: Conrad Campobello, MD;  Location: [facility] ORS;  Service: Vascular;  Laterality: Left;    (Not in a hospital admission)  Social History   Socioeconomic History   Marital status: Married    Spouse name: Not on file   Number of children: Not on file   Years of education: Not on file   Highest education level: Not on file  Occupational History   Not on file  Tobacco Use   Smoking status: Former    Types: Cigarettes    Quit date: 01/12/1993    Years since quitting: 28.9   Smokeless tobacco: Former    Types: Snuff, Chew   Tobacco comments:    DATE QUIT UNKNOWN  Vaping Use   Vaping Use: Never used  Substance and Sexual Activity   Alcohol use: No   Drug use: No   Sexual activity: Not on file  Other Topics Concern   Not on file  Social History Narrative   Not on file   Social Determinants of Health   Financial Resource Strain: Not on file  Food Insecurity: Not on file  Transportation Needs: Not on file  Physical Activity: Not on file  Stress: Not on file  Social Connections: Not on file  Intimate Partner Violence: Not At Risk (11/24/2021)   Humiliation, Afraid, Rape, and Kick questionnaire    Fear of Current or Ex-Partner: No    Emotionally Abused: No    Physically Abused: No    Sexually Abused: No    Family History  Problem Relation Age of Onset   Heart disease Mother     AAA (abdominal aortic aneurysm) Mother    Heart disease Father      Vitals:   12/11/21 1045 12/11/21 1100 12/11/21 1115 12/11/21 1130  BP: (!) 100/43 (!) 132/50 (!) 137/49 (!) 109/49  Pulse: 64 68 71 67  Resp: 13 17 (!) 21 (!) 22  Temp:      TempSrc:      SpO2: 100% 100% 97% 97%  Weight:      Height:        PHYSICAL EXAM General: Pleasant conversational elderly Caucasian female, well nourished, in no acute distress. Able to converse at length without dyspnea or discomfort. HEENT:  Normocephalic and atraumatic. Neck:  No JVD.  Lungs: Normal respiratory effort on room air. Clear bilaterally to auscultation. No wheezes, crackles, rhonchi.  Heart: HRRR . Normal S1 and S2 without gallops.  3/6 systolic murmur heard throughout murmurs.  Abdomen: Obese appearing.  Msk: Normal strength and tone for age. Extremities: Warm and well perfused. No clubbing, cyanosis.  1+ bilateral pedal edema, worse on the right.  Slight discoloration to the anterior aspect of her left foot that is chronic in nature per patient.  Neuro: Alert and oriented X 3. Psych:  Answers questions appropriately.   Labs: Basic Metabolic Panel: Recent Labs    12/11/21 0730  NA 138  K 4.2  CL 102  CO2 26  GLUCOSE 97  BUN 41*  CREATININE 1.80*  CALCIUM 9.2   Liver Function Tests: No results for input(s): "AST", "ALT", "ALKPHOS", "BILITOT", "PROT", "ALBUMIN" in the last 72 hours. No results for input(s): "LIPASE", "AMYLASE" in the last 72 hours. CBC: Recent Labs    12/11/21 0903 12/11/21 0932  WBC 7.8 7.2  HGB 5.6* 4.9*  HCT 18.7* 16.6*  MCV 101.1* 103.1*  PLT 188 162   Cardiac Enzymes: Recent Labs    12/11/21 0730 12/11/21 0932  TROPONINIHS 42* 37*   BNP: Recent Labs    12/11/21 0903  BNP 262.4*   D-Dimer: No results for input(s): "DDIMER" in the last 72 hours. Hemoglobin A1C: No results for input(s): "HGBA1C" in the last 72 hours. Fasting Lipid Panel: No results for input(s): "CHOL",  "HDL", "LDLCALC", "TRIG", "CHOLHDL", "LDLDIRECT" in the last 72 hours. Thyroid Function Tests: No results for input(s): "TSH", "T4TOTAL", "T3FREE", "THYROIDAB" in the last 72 hours.  Invalid input(s): "FREET3" Anemia Panel: Recent Labs    12/11/21 0903 12/11/21 0932  FOLATE  --  9.4  FERRITIN  --  20  TIBC  --  322  IRON  --  56  RETICCTPCT 9.8*  --      Radiology: DG Chest 2 View  Result Date: 12/11/2021 CLINICAL DATA:  Chest pain and shortness of breath. EXAM: CHEST - 2 VIEW COMPARISON:  11/19/2021 FINDINGS: Asymmetric elevation right hemidiaphragm The cardio pericardial silhouette is enlarged. The lungs are clear without focal pneumonia, edema, pneumothorax or pleural effusion. The visualized bony structures of the thorax are unremarkable. IMPRESSION: No active cardiopulmonary disease. Electronically Signed   By: Misty Stanley M.D.   On: 12/11/2021 07:53   CT HEAD WO CONTRAST (5MM)  Result Date: 11/19/2021 CLINICAL DATA:  Mental status change, unknown cause EXAM: CT HEAD WITHOUT CONTRAST TECHNIQUE: Contiguous axial images were obtained from the base of the skull through the vertex without intravenous contrast. RADIATION DOSE REDUCTION: This exam was performed according to the departmental dose-optimization program which includes automated exposure control, adjustment of the mA and/or kV according to patient size and/or use of iterative reconstruction technique. COMPARISON:  None Available. FINDINGS: Brain: No evidence of acute infarction, hemorrhage, hydrocephalus, extra-axial collection or mass lesion/mass effect. Vascular: No hyperdense vessel identified. Skull: No acute fracture. Sinuses/Orbits: Clear sinuses.  No acute orbital findings. Other: No mastoid effusions. IMPRESSION: No evidence of acute intracranial abnormality. Electronically Signed   By: Margaretha Sheffield M.D.   On: 11/19/2021 14:47   DG Chest Port 1 View  Result Date: 11/19/2021 CLINICAL DATA:  Altered mental status,  confusion EXAM: PORTABLE CHEST 1 VIEW COMPARISON:  None Available. FINDINGS: Transverse diameter of heart is increased. Dense calcification is seen in mitral annulus. Central pulmonary vessels are prominent. There are no signs of alveolar pulmonary edema. There is no focal pulmonary consolidation. There is no pleural effusion or pneumothorax. Azygous fissure is seen in right upper lung field. There is narrowing of transverse diameter of  the trachea in the lower neck suggesting possible enlarged thyroid. IMPRESSION: Cardiomegaly patent central pulmonary vessels are prominent without signs of alveolar pulmonary edema. There is no focal pulmonary consolidation. Electronically Signed   By: Elmer Picker M.D.   On: 11/19/2021 13:04   ECHOCARDIOGRAM COMPLETE  Result Date: 11/17/2021    ECHOCARDIOGRAM REPORT   Patient Name:   Rebecca Gilmore [facility] Date of Exam: 11/17/2021 Medical Rec #:  [id number]       Height:       64.0 in Accession #:    [id number]      Weight:       244.9 lb Date of Birth:  09-11-1940        BSA:          2.132 m Patient Age:    83 years        BP:           137/50 mmHg Patient Gender: F               HR:           60 bpm. Exam Location:  [facility] Procedure: 2D Echo, Cardiac Doppler and Color Doppler Indications:     Thromboembolism.  History:         Patient has prior history of Echocardiogram examinations, most                  recent 01/19/2017. High blood pressure, erlux.  Sonographer:     Sherrie Sport Referring Phys:  Erwin Diagnosing Phys: Isaias Cowman MD  Sonographer Comments: Technically difficult study due to poor echo windows. IMPRESSIONS  1. Left ventricular ejection fraction, by estimation, is 65 to 70%. The left ventricle has normal function. The left ventricle has no regional wall motion abnormalities. Left ventricular diastolic parameters are consistent with Grade II diastolic dysfunction (pseudonormalization).  2. Right ventricular systolic function is normal. The right  ventricular size is normal.  3. The mitral valve is normal in structure. Mild mitral valve regurgitation. Moderate mitral stenosis.  4. The aortic valve is normal in structure. Aortic valve regurgitation is mild to moderate. Moderate aortic valve stenosis.  5. The inferior vena cava is normal in size with greater than 50% respiratory variability, suggesting right atrial pressure of 3 mmHg. FINDINGS  Left Ventricle: Left ventricular ejection fraction, by estimation, is 65 to 70%. The left ventricle has normal function. The left ventricle has no regional wall motion abnormalities. The left ventricular internal cavity size was normal in size. There is  no left ventricular hypertrophy. Left ventricular diastolic parameters are consistent with Grade II diastolic dysfunction (pseudonormalization). Right Ventricle: The right ventricular size is normal. No increase in right ventricular wall thickness. Right ventricular systolic function is normal. Left Atrium: Left atrial size was normal in size. Right Atrium: Right atrial size was normal in size. Pericardium: There is no evidence of pericardial effusion. Mitral Valve: The mitral valve is normal in structure. Mild mitral valve regurgitation. Moderate mitral valve stenosis. MV peak gradient, 23.4 mmHg. The mean mitral valve gradient is 6.0 mmHg. Tricuspid Valve: The tricuspid valve is normal in structure. Tricuspid valve regurgitation is mild . No evidence of tricuspid stenosis. Aortic Valve: The aortic valve is normal in structure. Aortic valve regurgitation is mild to moderate. Moderate aortic stenosis is present. Aortic valve mean gradient measures 26.5 mmHg. Aortic valve peak gradient measures 52.8 mmHg. Aortic valve area, by VTI measures 1.21 cm. Pulmonic Valve: The pulmonic valve was normal  in structure. Pulmonic valve regurgitation is not visualized. No evidence of pulmonic stenosis. Aorta: The aortic root is normal in size and structure. Venous: The inferior vena  cava is normal in size with greater than 50% respiratory variability, suggesting right atrial pressure of 3 mmHg. IAS/Shunts: No atrial level shunt detected by color flow Doppler.  LEFT VENTRICLE PLAX 2D LVIDd:         4.40 cm   Diastology LVIDs:         2.70 cm   LV e' medial:    4.79 cm/s LV PW:         1.40 cm   LV E/e' medial:  32.2 LV IVS:        1.20 cm   LV e' lateral:   7.51 cm/s LVOT diam:     2.00 cm   LV E/e' lateral: 20.5 LV SV:         95 LV SV Index:   44 LVOT Area:     3.14 cm  RIGHT VENTRICLE RV Basal diam:  3.80 cm RV S prime:     9.57 cm/s TAPSE (M-mode): 1.9 cm LEFT ATRIUM              Index        RIGHT ATRIUM           Index LA diam:        4.50 cm  2.11 cm/m   RA Area:     19.80 cm LA Vol (A2C):   103.0 ml 48.30 ml/m  RA Volume:   49.60 ml  23.26 ml/m LA Vol (A4C):   106.0 ml 49.71 ml/m LA Biplane Vol: 105.0 ml 49.24 ml/m  AORTIC VALVE AV Area (Vmax):    0.98 cm AV Area (Vmean):   1.01 cm AV Area (VTI):     1.21 cm AV Vmax:           363.25 cm/s AV Vmean:          233.500 cm/s AV VTI:            0.782 m AV Peak Grad:      52.8 mmHg AV Mean Grad:      26.5 mmHg LVOT Vmax:         113.00 cm/s LVOT Vmean:        75.100 cm/s LVOT VTI:          0.302 m LVOT/AV VTI ratio: 0.39 MITRAL VALVE                TRICUSPID VALVE MV Area (PHT): 1.65 cm     TR Peak grad:   19.9 mmHg MV Area VTI:   1.33 cm     TR Vmax:        223.00 cm/s MV Peak grad:  23.4 mmHg MV Mean grad:  6.0 mmHg     SHUNTS MV Vmax:       2.42 m/s     Systemic VTI:  0.30 m MV Vmean:      112.0 cm/s   Systemic Diam: 2.00 cm MV Decel Time: 460 msec MV E velocity: 154.00 cm/s MV A velocity: 87.00 cm/s MV E/A ratio:  1.77 Isaias Cowman MD Electronically signed by Isaias Cowman MD Signature Date/Time: 11/17/2021/4:59:34 PM    Final    CT Angio Aortobifemoral W and/or Wo Contrast  Result Date: 11/16/2021 CLINICAL DATA:  Claudication or leg ischemia on the left with leg discoloration. EXAM: CT ANGIOGRAPHY OF ABDOMINAL  AORTA WITH  ILIOFEMORAL RUNOFF TECHNIQUE: Multidetector CT imaging of the abdomen, pelvis and lower extremities was performed using the standard protocol during bolus administration of intravenous contrast. Multiplanar CT image reconstructions and MIPs were obtained to evaluate the vascular anatomy. RADIATION DOSE REDUCTION: This exam was performed according to the departmental dose-optimization program which includes automated exposure control, adjustment of the mA and/or kV according to patient size and/or use of iterative reconstruction technique. CONTRAST:  156m OMNIPAQUE IOHEXOL 350 MG/ML SOLN COMPARISON:  None Available. FINDINGS: VASCULAR Aorta: Confluent atheromatous plaque. No aneurysm, ulceration, or dissection. Celiac: Separate origin of the common hepatic and splenic arteries. Ostial plaque without flow reducing stenosis. No acute finding SMA: Calcified plaque at the ostium.  No acute finding. Renals: Symmetric renal artery enhancement without detected beading or aneurysm. IMA: Small vessel that is patent RIGHT Lower Extremity Inflow: Atheromatous plaque without flow reducing stenosis or ulceration. Outflow: Calcified plaque along the posterior wall of the common femoral artery causes 50% stenosis. Runoff: Robust 2 vessel runoff at the ankle. Distally the peroneal artery reconstitutes the posterior tibial artery continuing into the plantar foot. LEFT Lower Extremity Inflow: Atheromatous plaque without flow limiting stenosis, ulceration, or aneurysm. Outflow: Abrupt occlusion of the left common femoral artery where there is calcified plaque. No meaningful flow within left thigh arteries, only tiny muscular branches are faintly enhancing. Runoff: Absent Veins: Not assessed Review of the MIP images confirms the above findings. NON-VASCULAR Lower chest: Small bilateral pleural effusion. Hepatobiliary: Limited by motion artifact but there appears to be surface lobulation and the caudate lobe is large compared  to the right lobe.No evidence of biliary obstruction or stone. Pancreas: Generalized atrophy Spleen: Unremarkable. Adrenals/Urinary Tract: Negative adrenals. No hydronephrosis or stone. Unremarkable bladder. Stomach/Bowel: No obstruction. No visible bowel inflammation or ischemia. Lymphatic: No mass or adenopathy. Reproductive:No pathologic findings. Other: No ascites or pneumoperitoneum. Musculoskeletal: Generalized lumbar spine degeneration which is advanced. IMPRESSION: 1. 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 2. Widespread atherosclerosis. 50% narrowing at the right common femoral artery and 2 vessel runoff at the right ankle. 3. Layering pleural effusions that are small. 4. Possible cirrhosis, please correlate for risk factors. Electronically Signed   By: JJorje GuildM.D.   On: 11/16/2021 05:52     TELEMETRY reviewed by me (LT) 12/11/2021 : Sinus rhythm rate 60s to 70s  Data reviewed by me (LT) 12/11/2021: GI note, ED note, CBCs, BMP, troponins, EKG, BNP, chest x-ray,  Principal Problem:   GI bleeding Active Problems:   Femoral artery occlusion, left (HCC)   Depression   HLD (hyperlipidemia)   Iron deficiency anemia   Chronic diastolic CHF (congestive heart failure) (HCC)   Acute renal failure superimposed on stage 3b chronic kidney disease ([place]   Myocardial injury   Atrial fibrillation, chronic ([place]    ASSESSMENT AND PLAN:  JSoledad Budreauis an 89yoFwith a PMH of CAD s/p NSTEMI 02/2021 (diffuse RCA, 80% ostial RCA, 50-60[facility], atrial fibrillation versus flutter in the setting of sepsis and NSTEMI 02/2021 -not on anticoagulation, moderate aortic stenosis, chronic HFpEF (EF 65-70%, G2 DD 11/2021) PAD s/p left iliofemoral endarterectomy and patch angioplasty 11/16/2021, CKD 3, hyperlipidemia, 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  Cardiology is consulted for further assistance.  #Symptomatic anemia  2/2 suspected UGIB #Atypical chest pain #Elevated troponin #CAD with diffuse RCA disease, 80% RCA, 50 to 60% prox-mid LAD Presents with progressive generalized weakness and intermittent chest discomfort since her endarterectomy 3 weeks ago.  Her initial complaint was actually worsening feet swelling, but admits to increased salt intake ([employer] broccoli cheddar soup) over the past several days as well.  She describes her chest pain as a sharp sensation in the center of her chest that radiates to the left side and down both of her arms, that lasts several minutes at a time, is not associated with shortness of breath, nausea, diaphoresis, and is somewhat better with sitting forward and Aspercreme application, but is usually self resolving.  Her troponin is minimally elevated and downtrending at 42-37, and EKG shows a stable RBBB with slight anterolateral ST depressions, likely related to her significant anemia and hypotension/demand supply ischemia and not ACS.  At my time of evaluation the patient is chest pain-free after receiving a unit of PRBCs (hemoglobin has trended 5.7-5.6-4.9 prior to transfusion).  -Agree with current therapy per primary team and GI -Agree with transfusion to maintain hemoglobin >8 -Continue metoprolol succinate 12.5 mg pre-, peri-, postoperatively -Continue rosuvastatin 5 mg once daily -She is not experiencing active heart failure symptoms at this time and is the lowest risk possible without further modifiable risk factors to undergo EGD/colonoscopy with GI.  No further cardiac diagnostics necessary prior to this.  #Paroxysmal atrial fibrillation in the setting of severe sepsis 02/2021 Followed closely by cardiology on an outpatient basis, with repeat 7-day Holter monitor without  burden of atrial fibrillation.  In the setting of her previous GI bleed in January 2023 and initial onset of A-fib due to a reversible cause, shared decision making with the patient and her primary cardiologist elected to not restart Eliquis.  -Currently in sinus rhythm -Defer to vascular surgery/primary team if she needs to be on Eliquis for an indication other than atrial fibrillation.  #Peripheral edema #Chronic HFpEF (EF 65-70%, G2 DD 11/2021) #Moderate aortic stenosis, mild to moderate AR BNP minimally elevated at 262 with mild pedal edema, chest x-ray without pulmonary edema or vascular congestion, and is relatively hypotensive, suggesting overall volume depletion. -Discussed low-salt diet, recommend avoiding canned soups or picking low-salt options -Hold home Lasix with AKI, restart as able  #PAD s/p left iliofemoral endarterectomy and patch angioplasty 11/16/2021 -Continue statin therapy, hold Eliquis  This patient's plan of care was discussed and created with Dr. Saralyn Pilar and he is in agreement.  Signed: Tristan Schroeder , PA-C 12/11/2021, 12:05 PM [facility]

## 2021-12-11 NOTE — Consult Note (Addendum)
Inpatient Consultation   Patient ID: Rebecca Gilmore is a 81 y.o. female.  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  Gastroenterology is consulted for GI bleeding.   Patient was recently hospitalized with left leg ischemia and underwent angioplasty for revascularization.  She has continued her Eliquis.  Upon discharge on September 16 her hemoglobin was 8.9 which is within her baseline of 8-9.  Upon presentation today her hemoglobin has distended from 5.7-4.9 after a 1 L bolus due to hypotension.  Her blood pressure has improved with his bolus and she is currently receiving 1 of 2 units of ordered PRBCs in the emergency department.  She has already received 1 dose of IV Protonix.  Patient notes her stools have been dark since leaving [place] in January. Over the last few days she has felt weaker and some DOE. She experienced chest pain in the hospital that is improving as her BP improves. Her initial BP with her chest pain was 60/40s which again has improved with ongoing resuscitation  BUN up at 41 creatinine 1.8 whereas it was 22 and 1.3 on September 18.  Troponin has been stable at 42 and 37.  EKG changes with depressions per emergency department physician.  Patient does take Protonix once daily at home.  She takes daily prednisone.  She does take iron supplementation at home and despite this her ferritin has decreased from 40 to 20.  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  She had a similar episode in January where she was seen at [facility] and was offered endoscopy which the patient declined as she wanted to go home per notation and patient report.  At that point in time they had stopped her Eliquis.  Last dose of Eliquis - evening of 12/10/21  Denies NSAIDs, Anti-plt agents Denies family history of gastrointestinal disease and malignancy Previous Endoscopies: 2013 EGD and Colonoscopy- grade c esophagitis and gastritis; Int Hemorrhoids;    Past Medical History:  Diagnosis Date   Depression    HBP (high blood pressure)    History of bladder problems    Memory loss    Muscle pain    Osteoporosis    Reflux    Sinus congestion    Swelling    B/L FEET AND LEGS    Past Surgical History:  Procedure Laterality Date   ARTERY BIOPSY Bilateral 12/01/2016   Procedure: BIOPSY TEMPORAL ARTERY;  Surgeon: Conrad Olivet, MD;  Location: [facility];  Service: Vascular;  Laterality: Bilateral;   CATARACT SURGERY     EYELID SURGERY Bilateral    GROWTH ON FACE     KNEE SURGERY Right    THROMBECTOMY FEMORAL ARTERY Left 11/16/2021   Procedure: THROMBECTOMY FEMORAL ARTERY;  Surgeon: Conrad Rush Center, MD;  Location: [facility];  Service: Vascular;  Laterality: Left;    Allergies  Allergen Reactions   Morphine And Related Itching   Kenalog [Triamcinolone Acetonide]  Other (See Comments)    FLUSH IN FACE   Penicillins Other (See Comments)    Has patient had a PCN reaction causing immediate rash, facial/tongue/throat swelling, SOB or lightheadedness with hypotension: Unknown Has patient had a PCN reaction causing severe rash involving mucus membranes or skin necrosis: Unknown Has patient had a PCN reaction that required hospitalization:No Has patient had a PCN reaction occurring within the last 10 years: No If all of the above answers are "NO", then may proceed with Cephalosporin use.     Family  History  Problem Relation Age of Onset   Heart disease Mother    AAA (abdominal aortic aneurysm) Mother    Heart disease Father     Social History   Tobacco Use   Smoking status: Former    Types: Cigarettes    Quit date: 01/12/1993    Years since quitting: 28.9   Smokeless tobacco: Former    Types: Snuff, Chew   Tobacco comments:    DATE QUIT UNKNOWN  Vaping Use   Vaping Use: Never used  Substance Use Topics   Alcohol use: No   Drug use: No     Pertinent GI related history and allergies were reviewed with the patient  Review of Systems  Constitutional:  Positive for appetite change and fatigue. Negative for activity change, chills, diaphoresis, fever and unexpected weight change.  HENT:  Negative for trouble swallowing and voice change.   Respiratory:  Positive for shortness of breath. Negative for wheezing.   Cardiovascular:  Positive for chest pain and leg swelling. Negative for palpitations.  Gastrointestinal:  Positive for blood in stool (melena). Negative for abdominal distention, abdominal pain, anal bleeding, constipation, diarrhea, nausea, rectal pain and vomiting.  Musculoskeletal:  Negative for arthralgias and myalgias.  Skin:  Positive for pallor. Negative for color change.  Neurological:  Positive for dizziness, weakness and light-headedness. Negative for syncope.  Psychiatric/Behavioral:  Negative for confusion.   All other systems reviewed and are negative.    Medications Home Medications No current facility-administered medications on file prior to encounter.   Current Outpatient Medications on File Prior to Encounter  Medication Sig Dispense Refill   amitriptyline (ELAVIL) 25 MG tablet Take 25 mg by mouth at bedtime.     apixaban (ELIQUIS) 5 MG TABS tablet Take 1 tablet (5 mg total) by mouth 2 (two) times daily. 60 tablet 3   apixaban (ELIQUIS) 5 MG TABS tablet Take 2 tablets (10 mg total) by mouth 2 (two) times daily. 6 tablet 0   cholecalciferol  (VITAMIN D3) 25 MCG (1000 UT) tablet Take 1,000 Units by mouth daily.     cyanocobalamin (VITAMIN B12) 1000 MCG tablet Take 1,000 mcg by mouth.     doxycycline (VIBRAMYCIN) 100 MG capsule Take 1 capsule (100 mg total) by mouth 2 (two) times daily. 14 capsule 0   Ferrous Sulfate (IRON) 325 (65 Fe) MG TABS Take 1 tablet by mouth daily.     furosemide (LASIX) 40 MG tablet Take 40 mg by mouth daily. For fluid retention (swollen ankles)     loratadine (CLARITIN) 10 MG tablet Take 10 mg by mouth daily.     metoprolol succinate (TOPROL-XL) 25 MG 24 hr tablet Take 12.5 mg by mouth daily.     oxyCODONE-acetaminophen (PERCOCET/ROXICET) 5-325 MG tablet Take 1 tablet by mouth every 6 (six) hours as needed for moderate pain. 30 tablet 0   pantoprazole (PROTONIX) 40 MG tablet Take 40 mg by mouth every morning.  predniSONE (DELTASONE) 1 MG tablet Take 2 mg by mouth daily.     pregabalin (LYRICA) 75 MG capsule Take 1 capsule (75 mg total) by mouth daily. 30 capsule 0   rosuvastatin (CRESTOR) 5 MG tablet TAKE 1 TABLET BY MOUTH AT BEDTIME FOR HIGH CHOLESTEROL     sertraline (ZOLOFT) 50 MG tablet Take 25 mg by mouth daily.  0   Pertinent GI related medications were reviewed with the patient  Inpatient Medications  Current Facility-Administered Medications:    0.9 %  sodium chloride infusion (Manually program via Guardrails IV Fluids), , Intravenous, Once, Blake Divine, MD   0.9 %  sodium chloride infusion, 10 mL/hr, Intravenous, Once, Blake Divine, MD   acetaminophen (TYLENOL) tablet 650 mg, 650 mg, Oral, Q6H PRN, Ivor Costa, MD   fentaNYL (SUBLIMAZE) injection 12.5 mcg, 12.5 mcg, Intravenous, Q3H PRN, Ivor Costa, MD   hydrALAZINE (APRESOLINE) injection 5 mg, 5 mg, Intravenous, Q2H PRN, Ivor Costa, MD   ondansetron (ZOFRAN) injection 4 mg, 4 mg, Intravenous, Q8H PRN, Ivor Costa, MD   pantoprazole (PROTONIX) injection 40 mg, 40 mg, Intravenous, Q12H, Ivor Costa, MD  Current Outpatient Medications:     amitriptyline (ELAVIL) 25 MG tablet, Take 25 mg by mouth at bedtime., Disp: , Rfl:    apixaban (ELIQUIS) 5 MG TABS tablet, Take 1 tablet (5 mg total) by mouth 2 (two) times daily., Disp: 60 tablet, Rfl: 3   apixaban (ELIQUIS) 5 MG TABS tablet, Take 2 tablets (10 mg total) by mouth 2 (two) times daily., Disp: 6 tablet, Rfl: 0   cholecalciferol (VITAMIN D3) 25 MCG (1000 UT) tablet, Take 1,000 Units by mouth daily., Disp: , Rfl:    cyanocobalamin (VITAMIN B12) 1000 MCG tablet, Take 1,000 mcg by mouth., Disp: , Rfl:    doxycycline (VIBRAMYCIN) 100 MG capsule, Take 1 capsule (100 mg total) by mouth 2 (two) times daily., Disp: 14 capsule, Rfl: 0   Ferrous Sulfate (IRON) 325 (65 Fe) MG TABS, Take 1 tablet by mouth daily., Disp: , Rfl:    furosemide (LASIX) 40 MG tablet, Take 40 mg by mouth daily. For fluid retention (swollen ankles), Disp: , Rfl:    loratadine (CLARITIN) 10 MG tablet, Take 10 mg by mouth daily., Disp: , Rfl:    metoprolol succinate (TOPROL-XL) 25 MG 24 hr tablet, Take 12.5 mg by mouth daily., Disp: , Rfl:    oxyCODONE-acetaminophen (PERCOCET/ROXICET) 5-325 MG tablet, Take 1 tablet by mouth every 6 (six) hours as needed for moderate pain., Disp: 30 tablet, Rfl: 0   pantoprazole (PROTONIX) 40 MG tablet, Take 40 mg by mouth every morning., Disp: , Rfl:    predniSONE (DELTASONE) 1 MG tablet, Take 2 mg by mouth daily., Disp: , Rfl:    pregabalin (LYRICA) 75 MG capsule, Take 1 capsule (75 mg total) by mouth daily., Disp: 30 capsule, Rfl: 0   rosuvastatin (CRESTOR) 5 MG tablet, TAKE 1 TABLET BY MOUTH AT BEDTIME FOR HIGH CHOLESTEROL, Disp: , Rfl:    sertraline (ZOLOFT) 50 MG tablet, Take 25 mg by mouth daily., Disp: , Rfl: 0  sodium chloride      acetaminophen, fentaNYL (SUBLIMAZE) injection, hydrALAZINE, ondansetron (ZOFRAN) IV   Objective   Vitals:   12/11/21 1045 12/11/21 1100 12/11/21 1115 12/11/21 1130  BP: (!) 100/43 (!) 132/50 (!) 137/49 (!) 109/49  Pulse: 64 68 71 67  Resp: 13 17  (!) 21 (!) 22  Temp:      TempSrc:      SpO2: 100%  100% 97% 97%  Weight:      Height:         Physical Exam Vitals and nursing note reviewed.  Constitutional:      General: She is not in acute distress.    Appearance: She is ill-appearing. She is not toxic-appearing or diaphoretic.  HENT:     Head: Normocephalic and atraumatic.     Nose: Nose normal.     Mouth/Throat:     Mouth: Mucous membranes are moist.     Pharynx: Oropharynx is clear.  Eyes:     General: No scleral icterus.    Extraocular Movements: Extraocular movements intact.     Comments: +Periorbital edema; + conjunctival pallor  Cardiovascular:     Rate and Rhythm: Normal rate. Rhythm irregular.     Heart sounds: Murmur heard.     No friction rub. No gallop.  Pulmonary:     Effort: Pulmonary effort is normal. No respiratory distress.     Breath sounds: No wheezing, rhonchi or rales.     Comments: Diminished bilaterally Abdominal:     General: Bowel sounds are normal. There is no distension.     Palpations: Abdomen is soft.     Tenderness: There is no abdominal tenderness. There is no guarding or rebound.  Musculoskeletal:     Cervical back: Neck supple.     Right lower leg: Edema present.     Left lower leg: Edema present.  Skin:    General: Skin is warm and dry.     Coloration: Skin is pale. Skin is not jaundiced.  Neurological:     General: No focal deficit present.     Mental Status: She is alert and oriented to person, place, and time. Mental status is at baseline.  Psychiatric:        Mood and Affect: Mood normal.        Behavior: Behavior normal.        Thought Content: Thought content normal.        Judgment: Judgment normal.     Laboratory Data Recent Labs  Lab 12/11/21 0730 12/11/21 0903 12/11/21 0932  WBC 7.2 7.8 7.2  HGB 5.7* 5.6* 4.9*  HCT 19.3* 18.7* 16.6*  PLT 212 188 162   Recent Labs  Lab 12/11/21 0730  NA 138  K 4.2  CL 102  CO2 26  BUN 41*  CALCIUM 9.2  GLUCOSE 97    Recent Labs  Lab 12/11/21 0932  INR 1.8*    No results for input(s): "LIPASE" in the last 72 hours.      Imaging Studies: DG Chest 2 View  Result Date: 12/11/2021 CLINICAL DATA:  Chest pain and shortness of breath. EXAM: CHEST - 2 VIEW COMPARISON:  11/19/2021 FINDINGS: Asymmetric elevation right hemidiaphragm The cardio pericardial silhouette is enlarged. The lungs are clear without focal pneumonia, edema, pneumothorax or pleural effusion. The visualized bony structures of the thorax are unremarkable. IMPRESSION: No active cardiopulmonary disease. Electronically Signed   By: Misty Stanley M.D.   On: 12/11/2021 07:53    Assessment:    # Suspected UGIB - pt on once daily home protonix - daily prednisone at home in addition to eliquis - ckd with AS -pt has noted melena at home, no hematochezia  # ABLA on chronic anemia - baseline 8-9; p/w 5.7 now 4.9 after 1 L IVF for hypotension (improved) - receiving 1 of 2 u prbc now  # hemorrhagic shock - improving with resuscitation after hypotensive at 60/40s  #  Afib on eliquis - last dose 12/10/21 pm  # PVD - L leg ischemia s/p intervention 11/2021  # Iron deficiency  # CKD  # HFpEF- g2 diastolic dysfunction; ef 52-48%  # aortic stenosis  Plan:  Esophagogastroduodenoscopy planned for tomorrow pending patient stability and endoscopy suite availability NPO now. Labs in am- bmp, cbc, inr Protonix 40 mg iv q12 h Hold dvt ppx Hold eliquis  Monitor H&H.  Transfusion and resuscitation as per primary team Avoid frequent lab draws to prevent lab induced anemia Supportive care and antiemetics as per primary team Maintain two sites IV access Avoid nsaids Monitor for GIB.  Should hemodynamic instability recur, recommend stat CTA for potential IR and vascular intervention if positive.  Esophagogastroduodenoscopy with possible biopsy, control of bleeding, polypectomy, and interventions as necessary has been discussed with the  patient/patient representative. Informed consent was obtained from the patient/patient representative after explaining the indication, nature, and risks of the procedure including but not limited to death, bleeding, perforation, missed neoplasm/lesions, cardiorespiratory compromise, and reaction to medications. Opportunity for questions was given and appropriate answers were provided. Patient/patient representative has verbalized understanding is amenable to undergoing the procedure.  Further recommendations pending endoscopy.  Please see op report for further details  I personally performed the service.  Management of other medical comorbidities as per primary team  Thank you for allowing [place] to participate in this patient's care. Please don't hesitate to call if any questions or concerns arise.   [name], DO [facility]  Portions of the record may have been created with voice recognition software. Occasional wrong-word or 'sound-a-like' substitutions may have occurred due to the inherent limitations of voice recognition software.  Read the chart carefully and recognize, using context, where substitutions may have occurred.

## 2021-12-11 NOTE — ED Triage Notes (Signed)
Pt via POV from home. Pt c/o leg and feet swelling, states she takes a fluid pill but it has not been working. States she also has pain across her chest. States the pain is not bad moreso of an ache. Pt she does not know if she has a hx of CHF. Pt is A&Ox4 and NAD

## 2021-12-11 NOTE — Assessment & Plan Note (Signed)
Myocardial injury due to severe anemia.  Consulted Dr. Saralyn Pilar of cardiology. Troponin level 42, 37. -Will not start aspirin due to GI bleeding -Continue Crestor -Trend troponin -As needed fentanyl for chest pain -Check A1c, FLP

## 2021-12-11 NOTE — Assessment & Plan Note (Signed)
Heart rate 70s. - Continue metoprolol with holding parameters for blood pressure  -Hold Eliquis

## 2021-12-11 NOTE — Assessment & Plan Note (Addendum)
2D echo on 11/17/2021 showed EF of 65-70% with grade 2 diastolic dysfunction.  Patient has elevated BNP 262, bilateral leg edema, but no shortness of breath, indicating mild fluid overload.  Due to GI bleed and hypotension, will not start diuretics. -Hold Lasix due to hypotension and worsening renal function -We will volume status closely

## 2021-12-11 NOTE — ED Notes (Signed)
Pt with sudden onset 10/10 chest pain. MD and RN to bedside. Pt states the pain is In her left chest and radiates down her left arm. EKG obtained. Follow up labs obtained. Pts pain seemed to ebb away only to return. Pt states the pain is coming in waves and lasting several minutes. Pain also radiating down into her "stomach"

## 2021-12-11 NOTE — H&P (Signed)
History and Physical    Rebecca Gilmore EGB:[id number] DOB: 09/02/1940 DOA: 12/11/2021  Referring MD/NP/PA:   PCP: Philmore Pali, NP (Inactive)   Patient coming from:  The patient is coming from home.   Chief Complaint: dark stool, chest pain and leg edema  HPI: Rebecca Gilmore is a 81 y.o. female with medical history significant of A fib on Eliquis, S/P Left femoral endarterectomy with thrombectomy due to critical limb ischemia, hypertension, hyperlipidemia, GERD, depression, iron deficiency anemia, CKD-3B, dCHF, temporal arteritis on low-dose prednisone (2 mg daily), who presents with dark stool, chest pain and leg edema.  Patient states that she has intermittent dark stools since January.  No nausea, vomiting, abdominal pain.  She developed chest pain this morning, which is located in left side of chest, mild to moderate, pressure-like, nonradiating.  Denies shortness of breath.  No cough, fever or chills.  Denies symptoms of UTI.  She has bilateral lower leg edema.  Last dose of Eliquis was last night. Of note, pt is s/p of left femoral endarterectomy with thrombectomy due to critical limb ischemia on 11/17/11. She is taking doxycycline for right groin wound currently.  Initially hypotensive with blood pressure 50s/40s, which improved to 117/39 after given 1 L of LR.   Data reviewed independently and ED Course: pt was found to have hemoglobin dropped from 8.9 on 11/24/2021 to 5.7 --> 4.9 today.  WBC 7.2, troponin level 42, 37, BNP 262, positive FOBT, temperature normal, heart rate 70, RR 18, oxygen saturation 97% - 100% on room air currently.  Chest x-ray negative. Pt is admitted to telemetry bed as inpatient.  Dr. Virgina Jock of GI and Dr. Saralyn Pilar of card are consulted.   EKG: I have personally reviewed.  First EKG showed sinus rhythm, QTc 431, right bundle blockade, LAD.  The repeated EKG showed sinus rhythm, ST depression in the inferior leads and precordial leads and ST elevation in  aVR.   Review of Systems:   General: no fevers, chills, no body weight gain, has fatigue HEENT: no blurry vision, hearing changes or sore throat Respiratory: no dyspnea, coughing, wheezing CV: has chest pain, no palpitations GI: no nausea, vomiting, abdominal pain, diarrhea, constipation. has dark stool GU: no dysuria, burning on urination, increased urinary frequency, hematuria  Ext: has leg edema Neuro: no unilateral weakness, numbness, or tingling, no vision change or hearing loss Skin: no rash, no skin tear. MSK: No muscle spasm, no deformity, no limitation of range of movement in spin Heme: No easy bruising.  Travel history: No recent long distant travel.   Allergy:  Allergies  Allergen Reactions   Morphine And Related Itching   Kenalog [Triamcinolone Acetonide] Other (See Comments)    FLUSH IN FACE   Penicillins Other (See Comments)    Has patient had a PCN reaction causing immediate rash, facial/tongue/throat swelling, SOB or lightheadedness with hypotension: Unknown Has patient had a PCN reaction causing severe rash involving mucus membranes or skin necrosis: Unknown Has patient had a PCN reaction that required hospitalization:No Has patient had a PCN reaction occurring within the last 10 years: No If all of the above answers are "NO", then may proceed with Cephalosporin use.     Past Medical History:  Diagnosis Date   Depression    HBP (high blood pressure)    History of bladder problems    Memory loss    Muscle pain    Osteoporosis    Reflux    Sinus congestion  Swelling    B/L FEET AND LEGS    Past Surgical History:  Procedure Laterality Date   ARTERY BIOPSY Bilateral 12/01/2016   Procedure: BIOPSY TEMPORAL ARTERY;  Surgeon: Conrad [place], MD;  Location: [facility];  Service: Vascular;  Laterality: Bilateral;   CATARACT SURGERY     EYELID SURGERY Bilateral    GROWTH ON FACE     KNEE SURGERY Right    THROMBECTOMY FEMORAL ARTERY Left 11/16/2021    Procedure: THROMBECTOMY FEMORAL ARTERY;  Surgeon: Conrad , MD;  Location: [facility];  Service: Vascular;  Laterality: Left;    Social History:  reports that she quit smoking about 28 years ago. Her smoking use included cigarettes. She has quit using smokeless tobacco.  Her smokeless tobacco use included snuff and chew. She reports that she does not drink alcohol and does not use drugs.  Family History:  Family History  Problem Relation Age of Onset   Heart disease Mother    AAA (abdominal aortic aneurysm) Mother    Heart disease Father      Prior to Admission medications   Medication Sig Start Date End Date Taking? Authorizing Provider  amitriptyline (ELAVIL) 25 MG tablet Take 25 mg by mouth at bedtime. 07/06/21   [provider]  apixaban (ELIQUIS) 5 MG TABS tablet Take 1 tablet (5 mg total) by mouth 2 (two) times daily. 11/27/21   Kris Hartmann, NP  apixaban (ELIQUIS) 5 MG TABS tablet Take 2 tablets (10 mg total) by mouth 2 (two) times daily. 11/25/21   Kris Hartmann, NP  cholecalciferol (VITAMIN D3) 25 MCG (1000 UT) tablet Take 1,000 Units by mouth daily.    [provider]  cyanocobalamin (VITAMIN B12) 1000 MCG tablet Take 1,000 mcg by mouth.    [provider]  doxycycline (VIBRAMYCIN) 100 MG capsule Take 1 capsule (100 mg total) by mouth 2 (two) times daily. 12/09/21   Kris Hartmann, NP  Ferrous Sulfate (IRON) 325 (65 Fe) MG TABS Take 1 tablet by mouth daily. 09/07/21   [provider]  furosemide (LASIX) 40 MG tablet Take 40 mg by mouth daily. For fluid retention (swollen ankles) 10/14/16   [provider]  loratadine (CLARITIN) 10 MG tablet Take 10 mg by mouth daily.    [provider]  metoprolol succinate (TOPROL-XL) 25 MG 24 hr tablet Take 12.5 mg by mouth daily. 09/14/21   [provider]  oxyCODONE-acetaminophen (PERCOCET/ROXICET) 5-325 MG tablet Take 1 tablet by mouth every 6 (six) hours as needed for moderate  pain. 11/25/21   Kris Hartmann, NP  pantoprazole (PROTONIX) 40 MG tablet Take 40 mg by mouth every morning. 10/01/21   [provider]  predniSONE (DELTASONE) 1 MG tablet Take 2 mg by mouth daily. 09/23/21   [provider]  pregabalin (LYRICA) 75 MG capsule Take 1 capsule (75 mg total) by mouth daily. 11/25/21   Kris Hartmann, NP  rosuvastatin (CRESTOR) 5 MG tablet TAKE 1 TABLET BY MOUTH AT BEDTIME FOR HIGH CHOLESTEROL 04/06/19   [provider]  sertraline (ZOLOFT) 50 MG tablet Take 25 mg by mouth daily. 10/14/17   [provider]    Physical Exam: Vitals:   12/11/21 1646 12/11/21 1651 12/11/21 1656 12/11/21 1657  BP: (!) 126/45 (!) 132/42  (!) 132/45  Pulse: (!) 58 (!) 58  61  Resp: _0 Temp: 98.5 F (36.9 C) 98.3 F (36.8 C) 98.4 F (36.9 C) 98.3 F (  36.8 C)  TempSrc: Oral Oral Oral Oral  SpO2: 98% 97%  98%  Weight:      Height:       General: Not in acute distress. Pale looking. HEENT:       Eyes: PERRL, EOMI, no scleral icterus.       ENT: No discharge from the ears and nose, no pharynx injection, no tonsillar enlargement.        Neck: No JVD, no bruit, no mass felt. Heme: No neck lymph node enlargement. Cardiac: S1/S2, RRR, No gallops or rubs. Respiratory: No rales, wheezing, rhonchi or rubs. GI: Soft, nondistended, nontender, no rebound pain, no organomegaly, BS present. GU: No hematuria Ext: has trace leg edema bilaterally. 1+DP/PT pulse bilaterally. Musculoskeletal: No joint deformities, No joint redness or warmth, no limitation of ROM in spin. Skin: No rashes.  Neuro: Alert, oriented X3, cranial nerves II-XII grossly intact, moves all extremities normally.  Psych: Patient is not psychotic, no suicidal or hemocidal ideation.  Labs on Admission: I have personally reviewed following labs and imaging studies  CBC: Recent Labs  Lab 12/11/21 0730 12/11/21 0903 12/11/21 0932 12/11/21 1634  WBC 7.2 7.8 7.2 7.1  HGB 5.7* 5.6*  4.9* 7.3*  HCT 19.3* 18.7* 16.6* 23.5*  MCV 104.3* 101.1* 103.1* 93.3  PLT 212 188 162 035   Basic Metabolic Panel: Recent Labs  Lab 12/11/21 0730  NA 138  K 4.2  CL 102  CO2 26  GLUCOSE 97  BUN 41*  CREATININE 1.80*  CALCIUM 9.2   GFR: Estimated Creatinine Clearance: 29.2 mL/min (A) (by C-G formula based on SCr of 1.8 mg/dL (H)). Liver Function Tests: No results for input(s): "AST", "ALT", "ALKPHOS", "BILITOT", "PROT", "ALBUMIN" in the last 168 hours. No results for input(s): "LIPASE", "AMYLASE" in the last 168 hours. No results for input(s): "AMMONIA" in the last 168 hours. Coagulation Profile: Recent Labs  Lab 12/11/21 0932  INR 1.8*   Cardiac Enzymes: No results for input(s): "CKTOTAL", "CKMB", "CKMBINDEX", "TROPONINI" in the last 168 hours. BNP (last 3 results) No results for input(s): "PROBNP" in the last 8760 hours. HbA1C: Recent Labs    12/11/21 0932  HGBA1C 4.1*   CBG: No results for input(s): "GLUCAP" in the last 168 hours. Lipid Profile: No results for input(s): "CHOL", "HDL", "LDLCALC", "TRIG", "CHOLHDL", "LDLDIRECT" in the last 72 hours. Thyroid Function Tests: No results for input(s): "TSH", "T4TOTAL", "FREET4", "T3FREE", "THYROIDAB" in the last 72 hours. Anemia Panel: Recent Labs    12/11/21 0903 12/11/21 0932  FOLATE  --  9.4  FERRITIN  --  20  TIBC  --  322  IRON  --  56  RETICCTPCT 9.8*  --    Urine analysis:    Component Value Date/Time   COLORURINE YELLOW (A) 11/19/2021 1223   APPEARANCEUR CLOUDY (A) 11/19/2021 1223   LABSPEC 1.015 11/19/2021 1223   PHURINE 5.0 11/19/2021 1223   GLUCOSEU NEGATIVE 11/19/2021 1223   HGBUR SMALL (A) 11/19/2021 1223   BILIRUBINUR NEGATIVE 11/19/2021 1223   KETONESUR NEGATIVE 11/19/2021 1223   PROTEINUR 30 (A) 11/19/2021 1223   NITRITE NEGATIVE 11/19/2021 1223   LEUKOCYTESUR LARGE (A) 11/19/2021 1223   Sepsis Labs: _0 (procalcitonin:4,lacticidven:4) )No results found for this or any  previous visit (from the past 240 hour(s)).   Radiological Exams on Admission: DG Chest 2 View  Result Date: 12/11/2021 CLINICAL DATA:  Chest pain and shortness of breath. EXAM: CHEST - 2 VIEW COMPARISON:  11/19/2021 FINDINGS: Asymmetric elevation right hemidiaphragm The cardio  pericardial silhouette is enlarged. The lungs are clear without focal pneumonia, edema, pneumothorax or pleural effusion. The visualized bony structures of the thorax are unremarkable. IMPRESSION: No active cardiopulmonary disease. Electronically Signed   By: Misty Stanley M.D.   On: 12/11/2021 07:53      Assessment/Plan Principal Problem:   GI bleeding Active Problems:   Acute blood loss anemia   Iron deficiency anemia   Chronic diastolic CHF (congestive heart failure) (HCC)   Acute renal failure superimposed on stage 3b chronic kidney disease (HCC)   Atrial fibrillation, chronic (HCC)   Femoral artery occlusion, left (HCC)   HLD (hyperlipidemia)   Myocardial injury   Temporal arteritis (HCC)   Depression   Assessment and Plan: * GI bleeding GI bleeding, acute blood loss anemia and history of iron deficiency anemia: Hemoglobin dropped from 8.9 --> 5.7 --> 4.9.  Initially hypotensive which responded to IV fluid resuscitation.  Started to Dr. Virgina Jock of GI.  - will admitted to tele bed as inpatient - transfuse 2 units of blood now - IVF: 1L LR bolus - Start IV pantoprazole 40 mg bid - Zofran IV for nausea - Avoid NSAIDs and SQ heparin - Maintain IV access (2 large bore IVs if possible). - Monitor closely and follow q6h cbc, transfuse as necessary, if Hgb<7.0 - La[facility]: INR, PTT and type screen, anemia panel -Continue iron supplement -hold Eliquis    Acute blood loss anemia -see above  Iron deficiency anemia -see above  Chronic diastolic CHF (congestive heart failure) ([place]) 2D echo on 11/17/2021 showed EF of 65-70% with grade 2 diastolic dysfunction.  Patient has elevated BNP 262, bilateral leg edema,  but no shortness of breath, indicating mild fluid overload.  Due to GI bleed and hypotension, will not start diuretics. -Hold Lasix due to hypotension and worsening renal function -We will volume status closely  Acute renal failure superimposed on stage 3b chronic kidney disease ([place]) Baseline creatinine 1.30 on 11/24/2021.  Her creatinine is 1.8, BUN 41, GFR 28. Likely due to volume depletion and continuation of diuretics -Hold Lasix  Atrial fibrillation, chronic (HCC) Heart rate 70s. - Continue metoprolol with holding parameters for blood pressure  -Hold Eliquis  Femoral artery occlusion, left (HCC) S/P Left femoral endarterectomy with thrombectomy due to critical limb ischemia. Patient still has ongoing healing wound in the left groin area.  No fever or leukocytosis. -Continue home doxycycline  HLD (hyperlipidemia) - Crestor  Myocardial injury Myocardial injury due to severe anemia.  Consulted Dr. Saralyn Pilar of cardiology. Troponin level 42, 37. -Will not start aspirin due to GI bleeding -Continue Crestor -Trend troponin -As needed fentanyl for chest pain -Check A1c, FLP  Temporal arteritis (HCC) - Continue low-dose prednisone 2 mg daily  Depression - Continue home medications          DVT ppx: SCD  Code Status: Full code  Family Communication:   Yes, patient's daughter by phone  Disposition Plan:  Anticipate discharge back to previous environment  Consults called:  Dr. Virgina Jock of GI and Dr. Saralyn Pilar of card are consulted.  Admission status and Level of care: Telemetry Medical:    as inpt        Dispo: The patient is from: Home              Anticipated d/c is to: Home              Anticipated d/c date is: 2 days  Patient currently is not medically stable to d/c.    Severity of Illness:  The appropriate patient status for this patient is INPATIENT. Inpatient status is judged to be reasonable and necessary in order to provide the required  intensity of service to ensure the patient's safety. The patient's presenting symptoms, physical exam findings, and initial radiographic and laboratory data in the context of their chronic comorbidities is felt to place them at high risk for further clinical deterioration. Furthermore, it is not anticipated that the patient will be medically stable for discharge from the hospital within 2 midnights of admission.   * I certify that at the point of admission it is my clinical judgment that the patient will require inpatient hospital care spanning beyond 2 midnights from the point of admission due to high intensity of service, high risk for further deterioration and high frequency of surveillance required.*       Date of Service 12/11/2021    Ivor Costa [facility]   If 7PM-7AM, please contact night-coverage [email or web address] 12/11/2021, 5:59 PM

## 2021-12-11 NOTE — Assessment & Plan Note (Signed)
-  Continue low-dose prednisone 2 mg daily

## 2021-12-11 NOTE — Assessment & Plan Note (Addendum)
S/P Leftfemoral endarterectomy with thrombectomy due to critical limb ischemia. Patient still has ongoing healing wound in the left groin area.  No fever or leukocytosis. -Continue home doxycycline

## 2021-12-11 NOTE — ED Notes (Signed)
This RN stated to pt that she appeared pale. Pt said she was and is anemic, having dark stools since leaving [facility]. Pt also states while at [facility] she had to get a blood transfusion.

## 2021-12-12 ENCOUNTER — Encounter: Payer: Self-pay | Admitting: Internal Medicine

## 2021-12-12 ENCOUNTER — Inpatient Hospital Stay: Payer: Medicare Other | Admitting: Anesthesiology

## 2021-12-12 ENCOUNTER — Encounter
Admission: EM | Disposition: A | Discharge status: Home Health | Payer: Self-pay | Source: Home / Self Care | Attending: Osteopathic Medicine

## 2021-12-12 DIAGNOSIS — F32A Depression, unspecified: Secondary | ICD-10-CM

## 2021-12-12 HISTORY — PX: ESOPHAGOGASTRODUODENOSCOPY (EGD) WITH PROPOFOL: SHX5813

## 2021-12-12 LAB — BPAM RBC
Blood Product Expiration Date: 202310172359
Blood Product Expiration Date: 202310172359
Blood Product Expiration Date: 202310172359
ISSUE DATE / TIME: 202310051013
ISSUE DATE / TIME: 202310051247
ISSUE DATE / TIME: 202310051622
Unit Type and Rh: 600
Unit Type and Rh: 600
Unit Type and Rh: 600

## 2021-12-12 LAB — TYPE AND SCREEN
ABO/RH(D): A NEG
Antibody Screen: NEGATIVE
Unit division: 0
Unit division: 0
Unit division: 0

## 2021-12-12 LAB — LIPID PANEL
Cholesterol: 111 mg/dL (ref 0–200)
HDL: 41 mg/dL (ref >40)
LDL Cholesterol: 52 mg/dL (ref 0–99)
Total CHOL/HDL Ratio: 2.7 RATIO
Triglycerides: 88 mg/dL (ref <150)
VLDL: 18 mg/dL (ref 0–40)

## 2021-12-12 LAB — CBC
HCT: 25.4 % — ABNORMAL LOW (ref 36.0–46.0)
HCT: 25.6 % — ABNORMAL LOW (ref 36.0–46.0)
Hemoglobin: 8.2 g/dL — ABNORMAL LOW (ref 12.0–15.0)
Hemoglobin: 8.5 g/dL — ABNORMAL LOW (ref 12.0–15.0)
MCH: 29.6 pg (ref 26.0–34.0)
MCH: 30.4 pg (ref 26.0–34.0)
MCHC: 32.3 g/dL (ref 30.0–36.0)
MCHC: 33.2 g/dL (ref 30.0–36.0)
MCV: 91.7 fL (ref 80.0–100.0)
MCV: 91.4 fL (ref 80.0–100.0)
Platelets: 169 10*3/uL (ref 150–400)
Platelets: 155 10*3/uL (ref 150–400)
RBC: 2.77 MIL/uL — ABNORMAL LOW (ref 3.87–5.11)
RBC: 2.8 MIL/uL — ABNORMAL LOW (ref 3.87–5.11)
RDW: 19.4 % — ABNORMAL HIGH (ref 11.5–15.5)
RDW: 19.8 % — ABNORMAL HIGH (ref 11.5–15.5)
WBC: 7.1 10*3/uL (ref 4.0–10.5)
WBC: 6.2 10*3/uL (ref 4.0–10.5)
nRBC: 0 % (ref 0.0–0.2)
nRBC: 0 % (ref 0.0–0.2)

## 2021-12-12 LAB — BASIC METABOLIC PANEL
Anion gap: 5 (ref 5–15)
BUN: 34 mg/dL — ABNORMAL HIGH (ref 8–23)
CO2: 28 mmol/L (ref 22–32)
Calcium: 9 mg/dL (ref 8.9–10.3)
Chloride: 109 mmol/L (ref 98–111)
Creatinine, Ser: 1.51 mg/dL — ABNORMAL HIGH (ref 0.44–1.00)
GFR, Estimated: 35 mL/min — ABNORMAL LOW (ref >60)
Glucose, Bld: 81 mg/dL (ref 70–99)
Potassium: 4 mmol/L (ref 3.5–5.1)
Sodium: 142 mmol/L (ref 135–145)

## 2021-12-12 LAB — GLUCOSE, CAPILLARY: Glucose-Capillary: 82 mg/dL (ref 70–99)

## 2021-12-12 LAB — PROTIME-INR
INR: 1.4 — ABNORMAL HIGH (ref 0.8–1.2)
Prothrombin Time: 16.9 seconds — ABNORMAL HIGH (ref 11.4–15.2)

## 2021-12-12 LAB — HEMOGLOBIN AND HEMATOCRIT, BLOOD
HCT: 32.4 % — ABNORMAL LOW (ref 36.0–46.0)
Hemoglobin: 10.3 g/dL — ABNORMAL LOW (ref 12.0–15.0)

## 2021-12-12 SURGERY — ESOPHAGOGASTRODUODENOSCOPY (EGD) WITH PROPOFOL
Anesthesia: General

## 2021-12-12 MED ORDER — PEG 3350-KCL-NA BICARB-NACL 420 G PO SOLR
4000.0000 mL | Freq: Once | ORAL | Status: AC
  Administered 2021-12-12: 4000 mL via ORAL
  Filled 2021-12-12: qty 4000

## 2021-12-12 MED ORDER — PHENYLEPHRINE HCL (PRESSORS) 10 MG/ML IV SOLN
INTRAVENOUS | Status: DC | PRN
  Administered 2021-12-12: 80 ug via INTRAVENOUS

## 2021-12-12 MED ORDER — PROPOFOL 10 MG/ML IV BOLUS
INTRAVENOUS | Status: DC | PRN
  Administered 2021-12-12 (×3): 20 mg via INTRAVENOUS
  Administered 2021-12-12: 60 mg via INTRAVENOUS

## 2021-12-12 MED ORDER — BISACODYL 5 MG PO TBEC
10.0000 mg | DELAYED_RELEASE_TABLET | Freq: Once | ORAL | Status: AC
  Administered 2021-12-12: 10 mg via ORAL
  Filled 2021-12-12: qty 2

## 2021-12-12 MED ORDER — PEG 3350-KCL-NA BICARB-NACL 420 G PO SOLR: 2000.0000 mL | Freq: Once | ORAL | Status: DC | PRN

## 2021-12-12 MED ORDER — SODIUM CHLORIDE 0.9 % IV SOLN: INTRAVENOUS | Status: DC

## 2021-12-12 MED ORDER — LIDOCAINE HCL (CARDIAC) PF 100 MG/5ML IV SOSY
PREFILLED_SYRINGE | INTRAVENOUS | Status: DC | PRN
  Administered 2021-12-12: 100 mg via INTRAVENOUS

## 2021-12-12 MED ORDER — EPHEDRINE SULFATE (PRESSORS) 50 MG/ML IJ SOLN
INTRAMUSCULAR | Status: DC | PRN
  Administered 2021-12-12 (×5): 5 mg via INTRAVENOUS

## 2021-12-12 NOTE — Interval H&P Note (Signed)
History and Physical Interval Note: Preprocedure H&P from 12/12/21  was reviewed and there was no interval change after seeing and examining the patient.  Written consent was obtained from the patient after discussion of risks, benefits, and alternatives. Patient has consented to proceed with Esophagogastroduodenoscopy with possible intervention   12/12/2021 12:23 PM  Rebecca Gilmore  has presented today for surgery, with the diagnosis of Upper GI bleed, anemia, melena.  The various methods of treatment have been discussed with the patient and family. After consideration of risks, benefits and other options for treatment, the patient has consented to  Procedure(s): ESOPHAGOGASTRODUODENOSCOPY (EGD) WITH PROPOFOL (N/A) as a surgical intervention.  The patient's history has been reviewed, patient examined, no change in status, stable for surgery.  I have reviewed the patient's chart and labs.  Questions were answered to the patient's satisfaction.     [name]

## 2021-12-12 NOTE — Anesthesia Postprocedure Evaluation (Signed)
Anesthesia Post Note  Patient: Rebecca Gilmore  Procedure(s) Performed: ESOPHAGOGASTRODUODENOSCOPY (EGD) WITH PROPOFOL  Patient location during evaluation: Endoscopy Anesthesia Type: General Level of consciousness: awake and alert Pain management: pain level controlled Vital Signs Assessment: post-procedure vital signs reviewed and stable Respiratory status: spontaneous breathing, nonlabored ventilation and respiratory function stable Cardiovascular status: blood pressure returned to baseline and stable Postop Assessment: no apparent nausea or vomiting Anesthetic complications: no   No notable events documented.   Last Vitals:  Vitals:   12/12/21 1331 12/12/21 1445  BP: 130/62 (!) 103/57  Pulse: 60 62  Resp: 18 16  Temp: 36.7 C 36.7 C  SpO2: 98% 98%    Last Pain:  Vitals:   12/12/21 1331  TempSrc: Oral  PainSc:                  [place] Ouch

## 2021-12-12 NOTE — Progress Notes (Signed)
[facility] Cardiology  SUBJECTIVE: Patient laying in bed, denies chest pain or shortness of breath   Vitals:   12/12/21 0131 12/12/21 0238 12/12/21 0830 12/12/21 1331  BP: (!) 92/45 (!) 112/50 (!) 119/57 130/62  Pulse: 65 64 63 60  Resp: _0 Temp: 98 F (36.7 C) 98 F (36.7 C) 98 F (36.7 C) 98 F (36.7 C)  TempSrc:  Oral Oral Oral  SpO2: 94% 98% 98% 98%  Weight:      Height:         Intake/Output Summary (Last 24 hours) at 12/12/2021 1409 Last data filed at 12/12/2021 0000 Gross per 24 hour  Intake 980 ml  Output --  Net 980 ml      PHYSICAL EXAM  General: Well developed, well nourished, in no acute distress HEENT:  Normocephalic and atramatic Neck:  No JVD.  Lungs: Clear bilaterally to auscultation and percussion. Heart: HRRR . Normal S1 and S2 without gallops or murmurs.  Abdomen: Bowel sounds are positive, abdomen soft and non-tender  Msk:  Back normal, normal gait. Normal strength and tone for age. Extremities: No clubbing, cyanosis or edema.   Neuro: Alert and oriented X 3. Psych:  Good affect, responds appropriately   LABS: Basic Metabolic Panel: Recent Labs    12/11/21 0730 12/12/21 0303  NA 138 142  K 4.2 4.0  CL 102 109  CO2 26 28  GLUCOSE 97 81  BUN 41* 34*  CREATININE 1.80* 1.51*  CALCIUM 9.2 9.0   Liver Function Tests: No results for input(s): "AST", "ALT", "ALKPHOS", "BILITOT", "PROT", "ALBUMIN" in the last 72 hours. No results for input(s): "LIPASE", "AMYLASE" in the last 72 hours. CBC: Recent Labs    12/12/21 0303 12/12/21 0833  WBC 7.1 6.2  HGB 8.2* 8.5*  HCT 25.4* 25.6*  MCV 91.7 91.4  PLT 169 155   Cardiac Enzymes: No results for input(s): "CKTOTAL", "CKMB", "CKMBINDEX", "TROPONINI" in the last 72 hours. BNP: Invalid input(s): "POCBNP" D-Dimer: No results for input(s): "DDIMER" in the last 72 hours. Hemoglobin A1C: Recent Labs    12/11/21 0932  HGBA1C 4.1*   Fasting Lipid Panel: Recent Labs    12/12/21 0303   CHOL 111  HDL 41  LDLCALC 52  TRIG 88  CHOLHDL 2.7   Thyroid Function Tests: No results for input(s): "TSH", "T4TOTAL", "T3FREE", "THYROIDAB" in the last 72 hours.  Invalid input(s): "FREET3" Anemia Panel: Recent Labs    12/11/21 0903 12/11/21 0932 12/11/21 1634  VITAMINB12  --   --  910  FOLATE  --  9.4  --   FERRITIN  --  20  --   TIBC  --  322  --   IRON  --  56  --   RETICCTPCT 9.8*  --   --     DG Chest 2 View  Result Date: 12/11/2021 CLINICAL DATA:  Chest pain and shortness of breath. EXAM: CHEST - 2 VIEW COMPARISON:  11/19/2021 FINDINGS: Asymmetric elevation right hemidiaphragm The cardio pericardial silhouette is enlarged. The lungs are clear without focal pneumonia, edema, pneumothorax or pleural effusion. The visualized bony structures of the thorax are unremarkable. IMPRESSION: No active cardiopulmonary disease. Electronically Signed   By: Misty Stanley M.D.   On: 12/11/2021 07:53     Echo EF 65-70% 11/17/2021, moderate aortic stenosis, mild to moderate aortic insufficiency  TELEMETRY: Sinus rhythm 65 bpm:  ASSESSMENT AND PLAN:  Principal Problem:   GI bleeding Active Problems:   Femoral artery occlusion,  left ([place])   Depression   HLD (hyperlipidemia)   Iron deficiency anemia   Chronic diastolic CHF (congestive heart failure) (HCC)   Acute renal failure superimposed on stage 3b chronic kidney disease (HCC)   Myocardial injury   Atrial fibrillation, chronic (HCC)   Temporal arteritis (HCC)   Acute blood loss anemia    1.  Borderline elevated troponin, likely demand supply ischemia due to GI bleed and marked anemia 2.  Coronary artery disease, history of NSTEMI 03/05/2021 with diffuse RCA, 80% stenosis ostial RCA, 56% stenosis proximal LAD left heart cath/20 11/2020 at [facility] treated medically 3.  Paroxysmal atrial fibrillation/atrial flutter, currently in sinus rhythm, not on chronic anticoagulation light of current GI bleed 4.  GI bleed, hemoglobin 8.5  and 25.6, respectively, EGD unremarkable, colonoscopy scheduled for 12/13/2021 5.  HFpEF, 65-70% 11/17/2021, stable peripheral edema 6.  Peripheral vascular disease, status post left iliofemoral endarterectomy and patch angioplasty 11/16/2021  Recommendations  1.  Continue current therapy 2.  Continue to hold apixaban 3.  Continue metoprolol succinate 4.  Continue rosuvastatin 5.  Defer further cardiac diagnostics at this time   Isaias Cowman, MD, PhD, [facility] 12/12/2021 2:09 PM

## 2021-12-12 NOTE — Op Note (Addendum)
[facility] Patient Name: Rebecca Gilmore Procedure Date: 12/12/2021 12:24 PM MRN: [id number] Account #: [id number] Date of Birth: 10/20/1940 Admit Type: Inpatient Age: 81 Room: [facility] ENDO ROOM 1 Gender: Female Note Status: Supervisor Override Instrument Name: Altamese Cabal Endoscope [id number] Procedure:             Upper GI endoscopy Indications:           Acute post hemorrhagic anemia, Melena Providers:             Rueben Bash, DO Referring MD:          Philmore Pali (Referring MD) Medicines:             Monitored Anesthesia Care Complications:         No immediate complications. Estimated blood loss:                         Minimal. Procedure:             Pre-Anesthesia Assessment:                        - Prior to the procedure, a History and Physical was                         performed, and patient medications and allergies were                         reviewed. The patient is competent. The risks and                         benefits of the procedure and the sedation options and                         risks were discussed with the patient. All questions                         were answered and informed consent was obtained.                         Patient identification and proposed procedure were                         verified by the physician, the nurse, the anesthetist                         and the technician in the endoscopy suite. Mental                         Status Examination: alert and oriented. Airway                         Examination: normal oropharyngeal airway and neck                         mobility. Respiratory Examination: clear to                         auscultation. CV Examination: systolic murmur.  Prophylactic Antibiotics: The patient does not require                         prophylactic antibiotics. Prior Anticoagulants: The                         patient has taken Eliquis (apixaban),  last dose was 2                         days prior to procedure. ASA Grade Assessment: IV - A                         patient with severe systemic disease that is a                         constant threat to life. After reviewing the risks and                         benefits, the patient was deemed in satisfactory                         condition to undergo the procedure. The anesthesia                         plan was to use monitored anesthesia care (MAC).                         Immediately prior to administration of medications,                         the patient was re-assessed for adequacy to receive                         sedatives. The heart rate, respiratory rate, oxygen                         saturations, blood pressure, adequacy of pulmonary                         ventilation, and response to care were monitored                         throughout the procedure. The physical status of the                         patient was re-assessed after the procedure.                        After obtaining informed consent, the endoscope was                         passed under direct vision. Throughout the procedure,                         the patient's blood pressure, pulse, and oxygen                         saturations were monitored continuously. The  Endoscope                         was introduced through the mouth, and advanced to the                         second part of duodenum. The upper GI endoscopy was                         accomplished without difficulty. The patient tolerated                         the procedure well. Findings:      The duodenal bulb, first portion of the duodenum and second portion of       the duodenum were normal. Estimated blood loss: none.      The Z-line was regular. Estimated blood loss: none.      Esophagogastric landmarks were identified: the gastroesophageal junction       was found at 35 cm from the incisors.      The exam of the  esophagus was otherwise normal.      A 2 cm hiatal hernia was present. Estimated blood loss: none.      Localized severe inflammation characterized by erosions, erythema,       friability and granularity was found in the gastric antrum. Biopsies       were taken with a cold forceps for histology. Biopsies were taken with a       cold forceps for Helicobacter pylori testing. Estimated blood loss was       minimal.      A benign-appearing, intrinsic mild stenosis was found at the pylorus.       This was traversed. Estimated blood loss: none. Impression:            - Normal duodenal bulb, first portion of the duodenum                         and second portion of the duodenum.                        - Z-line regular.                        - Esophagogastric landmarks identified.                        - 2 cm hiatal hernia.                        - Gastritis. Biopsied.                        - Gastric stenosis was found at the pylorus. Recommendation:        - Patient has a contact number available for                         emergencies. The signs and symptoms of potential                         delayed complications were discussed with the patient.  Return to normal activities tomorrow. Written                         discharge instructions were provided to the patient.                        - Return patient to hospital ward for ongoing care.                        - Full liquid diet.                        - Continue present medications.                        - Use Protonix (pantoprazole) 40 mg IV BID transition                         to po bid for 8 weeks upon discharge.                        - No aspirin, ibuprofen, naproxen, or other                         non-steroidal anti-inflammatory drugs.                        - Continue to hold eliquis until patient makes                         definitive decision about colonoscopy.                        Strongly  recommend colonoscopy                        - Await pathology results.                        - The findings and recommendations were discussed with                         the patient.                        - The findings and recommendations were discussed with                         the patient's primary physician. Procedure Code(s):     --- Professional ---                        [phone], Esophagogastroduodenoscopy, flexible,                         transoral; with biopsy, single or multiple Diagnosis Code(s):     --- Professional ---                        K44.9, Diaphragmatic hernia without obstruction or  gangrene                        K29.70, Gastritis, unspecified, without bleeding                        K31.1, Adult hypertrophic pyloric stenosis                        D62, Acute posthemorrhagic anemia                        K92.1, Melena (includes Hematochezia) CPT copyright 2022 American Medical Association. All rights reserved. The codes documented in this report are preliminary and upon coder review may  be revised to meet current compliance requirements. Attending Participation:      I personally performed the entire procedure. Volney American, DO [name] DO, DO 12/12/2021 12:47:37 PM This report has been signed electronically. Number of Addenda: 0 Note Initiated On: 12/12/2021 12:24 PM Estimated Blood Loss:  Estimated blood loss was minimal.      [facility]

## 2021-12-12 NOTE — Anesthesia Preprocedure Evaluation (Addendum)
Anesthesia Evaluation  Patient identified by MRN, date of birth, ID band Patient awake    Reviewed: Allergy & Precautions, NPO status , Patient's Chart, lab work & pertinent test results, reviewed documented beta blocker date and time   History of Anesthesia Complications Negative for: history of anesthetic complications  Airway Mallampati: III  TM Distance: <3 FB Neck ROM: full    Dental  (+) Missing   Pulmonary shortness of breath, with exertion and Long-Term Oxygen Therapy, COPD, former smoker,    Pulmonary exam normal        Cardiovascular hypertension, Pt. on medications (-) angina+ CAD (diffuse RCA disease) and + Peripheral Vascular Disease (L leg ischemia s/p intervention 11/2021)  CHF: DD.  Normal cardiovascular exam+ dysrhythmias Atrial Fibrillation + Valvular Problems/Murmurs AS   Ekg: RBBB and LAFB  ECHO 9/11: 1. Left ventricular ejection fraction, by estimation, is 65 to 70%. The  left ventricle has normal function. The left ventricle has no regional  wall motion abnormalities. Left ventricular diastolic parameters are  consistent with Grade II diastolic  dysfunction (pseudonormalization).  2. Right ventricular systolic function is normal. The right ventricular  size is normal.  3. The mitral valve is normal in structure. Mild mitral valve  regurgitation. Moderate mitral stenosis.  4. The aortic valve is normal in structure. Aortic valve regurgitation is  mild to moderate. Moderate aortic valve stenosis.  5. The inferior vena cava is normal in size with greater than 50%  respiratory variability, suggesting right atrial pressure of 3 mmHg.  Elevated troponin and other cardiac issues likely related to significant anemia and hypotension/demand ischemia.   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 baseline 8-9; p/w 5.7  - received 2 uprbc with improvement to 8.2   Anesthesia Other Findings Past Medical History: No date: Depression No date: HBP (high blood pressure) No date: History of bladder problems No date: Memory loss No date: Muscle pain No date: Osteoporosis No date: Reflux No date: Sinus congestion No date: Swelling     Comment:  B/L FEET AND LEGS  Past Surgical History: 12/01/2016: ARTERY BIOPSY; Bilateral     Comment:  Procedure: BIOPSY TEMPORAL ARTERY;  Surgeon: Conrad Ronan, MD;  Location: [facility] OR;  Service: Vascular;  Laterality:              Bilateral; No date: CATARACT SURGERY No date: EYELID SURGERY; Bilateral No date: GROWTH ON FACE No date: KNEE SURGERY; Right  BMI    Body Mass Index: 40.68 kg/m      Reproductive/Obstetrics negative OB ROS                          Anesthesia Physical  Anesthesia Plan  ASA: 3  Anesthesia Plan: General   Post-op Pain Management: Minimal or no pain anticipated   Induction: Intravenous  PONV Risk Score and Plan: Treatment may vary due to age or medical condition, Propofol infusion and TIVA  Airway Management Planned: Natural Airway  Additional Equipment:   Intra-op Plan:   Post-operative Plan:  Informed Consent: I have reviewed the patients History and Physical, chart, labs and discussed the procedure including the risks, benefits and alternatives for the proposed anesthesia with the patient or authorized representative who has indicated his/her understanding and acceptance.     Dental Advisory Given  Plan Discussed with: Anesthesiologist, CRNA and Surgeon  Anesthesia Plan Comments:       Anesthesia Quick Evaluation

## 2021-12-12 NOTE — H&P (View-Only) (Signed)
Inpatient Follow-up/Progress Note   Patient ID: Rebecca Gilmore is a 81 y.o. female.  Overnight Events / Subjective Findings NAEON. VSS o/n. Pt received blood 2u prbc and improved beyond expected. Hgb 8.5 now.  No abdominal pain/n/v. No chest pain or sob. NPO since midnight. Plan for egd today. Pt is very reluctant to undergo colonoscopy and currently declining this option.  Review of Systems  Constitutional:  Positive for fatigue. Negative for activity change, appetite change, chills, diaphoresis, fever and unexpected weight change.  HENT:  Negative for trouble swallowing and voice change.   Respiratory:  Positive for shortness of breath. Negative for wheezing.   Cardiovascular:  Positive for chest pain. Negative for palpitations and leg swelling.  Gastrointestinal:  Positive for blood in stool (melena). Negative for abdominal distention, abdominal pain, anal bleeding, constipation, diarrhea, nausea, rectal pain and vomiting.  Musculoskeletal:  Negative for arthralgias and myalgias.  Skin:  Negative for color change and pallor.  Neurological:  Positive for weakness and light-headedness. Negative for dizziness and syncope.  Psychiatric/Behavioral:  Negative for confusion.   All other systems reviewed and are negative.    Medications  Current Facility-Administered Medications:    0.9 %  sodium chloride infusion, 10 mL/hr, Intravenous, Once, Blake Divine, MD   acetaminophen (TYLENOL) tablet 650 mg, 650 mg, Oral, Q6H PRN, Ivor Costa, MD   amitriptyline (ELAVIL) tablet 25 mg, 25 mg, Oral, QHS, Ivor Costa, MD   cholecalciferol (VITAMIN D3) 25 MCG (1000 UNIT) tablet 1,000 Units, 1,000 Units, Oral, Daily, Ivor Costa, MD, 1,000 Units at 12/12/21 1423   cyanocobalamin (VITAMIN B12) tablet 1,000 mcg, 1,000 mcg, Oral, Daily, Ivor Costa, MD, 1,000 mcg at 12/12/21 2009   doxycycline (VIBRA-TABS) tablet 100 mg, 100 mg, Oral, BID, Ivor Costa, MD, 100 mg at 12/12/21 0836   fentaNYL (SUBLIMAZE)  injection 12.5 mcg, 12.5 mcg, Intravenous, Q3H PRN, Ivor Costa, MD   ferrous sulfate tablet 325 mg, 325 mg, Oral, Daily, Ivor Costa, MD, 325 mg at 12/11/21 1630   hydrALAZINE (APRESOLINE) injection 5 mg, 5 mg, Intravenous, Q2H PRN, Ivor Costa, MD   loratadine (CLARITIN) tablet 10 mg, 10 mg, Oral, Daily PRN, Ivor Costa, MD   metoprolol succinate (TOPROL-XL) 24 hr tablet 12.5 mg, 12.5 mg, Oral, Daily, Ivor Costa, MD, 12.5 mg at 12/12/21 0836   ondansetron (ZOFRAN) injection 4 mg, 4 mg, Intravenous, Q8H PRN, Ivor Costa, MD   pantoprazole (PROTONIX) injection 40 mg, 40 mg, Intravenous, Q12H, Ivor Costa, MD, 40 mg at 12/12/21 4179   predniSONE (DELTASONE) tablet 2 mg, 2 mg, Oral, Daily, Ivor Costa, MD, 2 mg at 12/12/21 0839   pregabalin (LYRICA) capsule 75 mg, 75 mg, Oral, Daily, Ivor Costa, MD, 75 mg at 12/12/21 0836   rosuvastatin (CRESTOR) tablet 5 mg, 5 mg, Oral, Daily, Ivor Costa, MD, 5 mg at 12/12/21 0837   sertraline (ZOLOFT) tablet 25 mg, 25 mg, Oral, Daily, Ivor Costa, MD, 25 mg at 12/12/21 0837  sodium chloride      acetaminophen, fentaNYL (SUBLIMAZE) injection, hydrALAZINE, loratadine, ondansetron (ZOFRAN) IV   Objective    Vitals:   12/11/21 2113 12/12/21 0131 12/12/21 0238 12/12/21 0830  BP: (!) 141/46 (!) 92/45 (!) 112/50 (!) 119/57  Pulse: 64 65 64 63  Resp: _0 Temp: 98.5 F (36.9 C) 98 F (36.7 C) 98 F (36.7 C) 98 F (36.7 C)  TempSrc: Oral  Oral Oral  SpO2: 98% 94% 98% 98%  Weight: 108.4 kg  Height: _0  (1.626 m)        Physical Exam Vitals and nursing note reviewed.  Constitutional:      General: She is not in acute distress.    Appearance: She is obese. She is ill-appearing. She is not toxic-appearing or diaphoretic.  HENT:     Head: Normocephalic and atraumatic.     Nose: Nose normal.     Mouth/Throat:     Mouth: Mucous membranes are moist.     Pharynx: Oropharynx is clear.  Eyes:     General: No scleral icterus.    Extraocular Movements:  Extraocular movements intact.     Comments: +Periorbital edema; + conjunctival pallor   Cardiovascular:     Rate and Rhythm: Normal rate and regular rhythm.     Heart sounds: Murmur heard.     No friction rub. No gallop.  Pulmonary:     Effort: Pulmonary effort is normal. No respiratory distress.     Breath sounds: Normal breath sounds. No wheezing, rhonchi or rales.  Abdominal:     General: Abdomen is flat. Bowel sounds are normal. There is no distension.     Palpations: Abdomen is soft.     Tenderness: There is no abdominal tenderness. There is no guarding or rebound.  Musculoskeletal:     Cervical back: Neck supple.     Right lower leg: Edema present.     Left lower leg: Edema present.  Skin:    General: Skin is warm and dry.     Coloration: Skin is pale. Skin is not jaundiced.  Neurological:     General: No focal deficit present.     Mental Status: She is alert and oriented to person, place, and time. Mental status is at baseline.  Psychiatric:        Mood and Affect: Mood normal.        Behavior: Behavior normal.        Thought Content: Thought content normal.        Judgment: Judgment normal.      Laboratory Data Recent Labs  Lab 12/11/21 0932 12/11/21 1634 12/11/21 2152 12/12/21 0303  WBC 7.2 7.1  --  7.1  HGB 4.9* 7.3* 8.6* 8.2*  HCT 16.6* 23.5* 26.4* 25.4*  PLT 162 172  --  169   Recent Labs  Lab 12/11/21 0730 12/12/21 0303  NA 138 142  K 4.2 4.0  CL 102 109  CO2 26 28  BUN 41* 34*  CREATININE 1.80* 1.51*  CALCIUM 9.2 9.0  GLUCOSE 97 81   Recent Labs  Lab 12/11/21 0932 12/12/21 0303  INR 1.8* 1.4*      Imaging Studies: DG Chest 2 View  Result Date: 12/11/2021 CLINICAL DATA:  Chest pain and shortness of breath. EXAM: CHEST - 2 VIEW COMPARISON:  11/19/2021 FINDINGS: Asymmetric elevation right hemidiaphragm The cardio pericardial silhouette is enlarged. The lungs are clear without focal pneumonia, edema, pneumothorax or pleural effusion. The  visualized bony structures of the thorax are unremarkable. IMPRESSION: No active cardiopulmonary disease. Electronically Signed   By: Misty Stanley M.D.   On: 12/11/2021 07:53    Assessment:   # Suspected UGIB - pt on once daily home protonix - daily prednisone at home in addition to eliquis - ckd with AS -pt has noted melena at home, no hematochezia   # ABLA on chronic anemia - baseline 8-9; p/w 5.7 now 4.9 after 1 L IVF for hypotension (improved); - received 2 uprbc with improvement to  8.2   # hemorrhagic shock - improving with resuscitation after hypotensive at 60/40s   # Afib on eliquis - last dose 12/10/21 pm   # PVD - L leg ischemia s/p intervention 11/2021   # Iron deficiency   # CKD   # HFpEF- g2 diastolic dysfunction; ef 94-09%   # aortic stenosis  Plan:  Esophagogastroduodenoscopy planned for today pending patient stability and endoscopy suite availability NPO since midnight Morning labs reviewed Protonix 40 mg iv q12 h Hold dvt ppx Hold eliquis  Pt currently declining colonoscopy. Emphasized if EGD is negative, that the recommendation would be to undergo colonoscopy to assess for other bleeding sources that would otherwise be missed if not performed. However, the patient still declines colonoscopy.  Monitor H&H.  Transfusion and resuscitation as per primary team Avoid frequent lab draws to prevent lab induced anemia Supportive care and antiemetics as per primary team Maintain two sites IV access Avoid nsaids Monitor for GIB.  Should hemodynamic instability recur, recommend stat CTA for potential IR and vascular intervention if positive.   Esophagogastroduodenoscopy with possible biopsy, control of bleeding, polypectomy, and interventions as necessary has been discussed with the patient/patient representative. Informed consent was obtained from the patient/patient representative after explaining the indication, nature, and risks of the procedure including but  not limited to death, bleeding, perforation, missed neoplasm/lesions, cardiorespiratory compromise, and reaction to medications. Opportunity for questions was given and appropriate answers were provided. Patient/patient representative has verbalized understanding is amenable to undergoing the procedure.   Further recommendations pending endoscopy.  Please see op report for further details  I personally performed the service.  Management of other medical comorbidities as per primary team  Thank you for allowing [place] to participate in this patient's care. Please don't hesitate to call if any questions or concerns arise.   [name], DO [facility]  Portions of the record may have been created with voice recognition software. Occasional wrong-word or 'sound-a-like' substitutions may have occurred due to the inherent limitations of voice recognition software.  Read the chart carefully and recognize, using context, where substitutions may have occurred.

## 2021-12-12 NOTE — Progress Notes (Signed)
 Inpatient Follow-up/Progress Note   Patient ID: Rebecca Gilmore is a 81 y.o. female.  Overnight Events / Subjective Findings NAEON. VSS o/n. Pt received blood 2u prbc and improved beyond expected. Hgb 8.5 now.  No abdominal pain/n/v. No chest pain or sob. NPO since midnight. Plan for egd today. Pt is very reluctant to undergo colonoscopy and currently declining this option.  Review of Systems  Constitutional:  Positive for fatigue. Negative for activity change, appetite change, chills, diaphoresis, fever and unexpected weight change.  HENT:  Negative for trouble swallowing and voice change.   Respiratory:  Positive for shortness of breath. Negative for wheezing.   Cardiovascular:  Positive for chest pain. Negative for palpitations and leg swelling.  Gastrointestinal:  Positive for blood in stool (melena). Negative for abdominal distention, abdominal pain, anal bleeding, constipation, diarrhea, nausea, rectal pain and vomiting.  Musculoskeletal:  Negative for arthralgias and myalgias.  Skin:  Negative for color change and pallor.  Neurological:  Positive for weakness and light-headedness. Negative for dizziness and syncope.  Psychiatric/Behavioral:  Negative for confusion.   All other systems reviewed and are negative.    Medications  Current Facility-Administered Medications:    0.9 %  sodium chloride infusion, 10 mL/hr, Intravenous, Once, Jessup, Charles, MD   acetaminophen (TYLENOL) tablet 650 mg, 650 mg, Oral, Q6H PRN, Niu, Xilin, MD   amitriptyline (ELAVIL) tablet 25 mg, 25 mg, Oral, QHS, Niu, Xilin, MD   cholecalciferol (VITAMIN D3) 25 MCG (1000 UNIT) tablet 1,000 Units, 1,000 Units, Oral, Daily, Niu, Xilin, MD, 1,000 Units at 12/12/21 0837   cyanocobalamin (VITAMIN B12) tablet 1,000 mcg, 1,000 mcg, Oral, Daily, Niu, Xilin, MD, 1,000 mcg at 12/12/21 0837   doxycycline (VIBRA-TABS) tablet 100 mg, 100 mg, Oral, BID, Niu, Xilin, MD, 100 mg at 12/12/21 0836   fentaNYL (SUBLIMAZE)  injection 12.5 mcg, 12.5 mcg, Intravenous, Q3H PRN, Niu, Xilin, MD   ferrous sulfate tablet 325 mg, 325 mg, Oral, Daily, Niu, Xilin, MD, 325 mg at 12/11/21 1630   hydrALAZINE (APRESOLINE) injection 5 mg, 5 mg, Intravenous, Q2H PRN, Niu, Xilin, MD   loratadine (CLARITIN) tablet 10 mg, 10 mg, Oral, Daily PRN, Niu, Xilin, MD   metoprolol succinate (TOPROL-XL) 24 hr tablet 12.5 mg, 12.5 mg, Oral, Daily, Niu, Xilin, MD, 12.5 mg at 12/12/21 0836   ondansetron (ZOFRAN) injection 4 mg, 4 mg, Intravenous, Q8H PRN, Niu, Xilin, MD   pantoprazole (PROTONIX) injection 40 mg, 40 mg, Intravenous, Q12H, Niu, Xilin, MD, 40 mg at 12/12/21 0837   predniSONE (DELTASONE) tablet 2 mg, 2 mg, Oral, Daily, Niu, Xilin, MD, 2 mg at 12/12/21 0839   pregabalin (LYRICA) capsule 75 mg, 75 mg, Oral, Daily, Niu, Xilin, MD, 75 mg at 12/12/21 0836   rosuvastatin (CRESTOR) tablet 5 mg, 5 mg, Oral, Daily, Niu, Xilin, MD, 5 mg at 12/12/21 0837   sertraline (ZOLOFT) tablet 25 mg, 25 mg, Oral, Daily, Niu, Xilin, MD, 25 mg at 12/12/21 0837  sodium chloride      acetaminophen, fentaNYL (SUBLIMAZE) injection, hydrALAZINE, loratadine, ondansetron (ZOFRAN) IV   Objective    Vitals:   12/11/21 2113 12/12/21 0131 12/12/21 0238 12/12/21 0830  BP: (!) 141/46 (!) 92/45 (!) 112/50 (!) 119/57  Pulse: 64 65 64 63  Resp: 20 18 18 18  Temp: 98.5 F (36.9 C) 98 F (36.7 C) 98 F (36.7 C) 98 F (36.7 C)  TempSrc: Oral  Oral Oral  SpO2: 98% 94% 98% 98%  Weight: 108.4 kg       Height: 5' 4" (1.626 m)        Physical Exam Vitals and nursing note reviewed.  Constitutional:      General: She is not in acute distress.    Appearance: She is obese. She is ill-appearing. She is not toxic-appearing or diaphoretic.  HENT:     Head: Normocephalic and atraumatic.     Nose: Nose normal.     Mouth/Throat:     Mouth: Mucous membranes are moist.     Pharynx: Oropharynx is clear.  Eyes:     General: No scleral icterus.    Extraocular Movements:  Extraocular movements intact.     Comments: +Periorbital edema; + conjunctival pallor   Cardiovascular:     Rate and Rhythm: Normal rate and regular rhythm.     Heart sounds: Murmur heard.     No friction rub. No gallop.  Pulmonary:     Effort: Pulmonary effort is normal. No respiratory distress.     Breath sounds: Normal breath sounds. No wheezing, rhonchi or rales.  Abdominal:     General: Abdomen is flat. Bowel sounds are normal. There is no distension.     Palpations: Abdomen is soft.     Tenderness: There is no abdominal tenderness. There is no guarding or rebound.  Musculoskeletal:     Cervical back: Neck supple.     Right lower leg: Edema present.     Left lower leg: Edema present.  Skin:    General: Skin is warm and dry.     Coloration: Skin is pale. Skin is not jaundiced.  Neurological:     General: No focal deficit present.     Mental Status: She is alert and oriented to person, place, and time. Mental status is at baseline.  Psychiatric:        Mood and Affect: Mood normal.        Behavior: Behavior normal.        Thought Content: Thought content normal.        Judgment: Judgment normal.      Laboratory Data Recent Labs  Lab 12/11/21 0932 12/11/21 1634 12/11/21 2152 12/12/21 0303  WBC 7.2 7.1  --  7.1  HGB 4.9* 7.3* 8.6* 8.2*  HCT 16.6* 23.5* 26.4* 25.4*  PLT 162 172  --  169   Recent Labs  Lab 12/11/21 0730 12/12/21 0303  NA 138 142  K 4.2 4.0  CL 102 109  CO2 26 28  BUN 41* 34*  CREATININE 1.80* 1.51*  CALCIUM 9.2 9.0  GLUCOSE 97 81   Recent Labs  Lab 12/11/21 0932 12/12/21 0303  INR 1.8* 1.4*      Imaging Studies: DG Chest 2 View  Result Date: 12/11/2021 CLINICAL DATA:  Chest pain and shortness of breath. EXAM: CHEST - 2 VIEW COMPARISON:  11/19/2021 FINDINGS: Asymmetric elevation right hemidiaphragm The cardio pericardial silhouette is enlarged. The lungs are clear without focal pneumonia, edema, pneumothorax or pleural effusion. The  visualized bony structures of the thorax are unremarkable. IMPRESSION: No active cardiopulmonary disease. Electronically Signed   By: Eric  Mansell M.D.   On: 12/11/2021 07:53    Assessment:   # Suspected UGIB - pt on once daily home protonix - daily prednisone at home in addition to eliquis - ckd with AS -pt has noted melena at home, no hematochezia   # ABLA on chronic anemia - baseline 8-9; p/w 5.7 now 4.9 after 1 L IVF for hypotension (improved); - received 2 uprbc with improvement to   8.2   # hemorrhagic shock - improving with resuscitation after hypotensive at 60/40s   # Afib on eliquis - last dose 12/10/21 pm   # PVD - L leg ischemia s/p intervention 11/2021   # Iron deficiency   # CKD   # HFpEF- g2 diastolic dysfunction; ef 94-09%   # aortic stenosis  Plan:  Esophagogastroduodenoscopy planned for today pending patient stability and endoscopy suite availability NPO since midnight Morning labs reviewed Protonix 40 mg iv q12 h Hold dvt ppx Hold eliquis  Pt currently declining colonoscopy. Emphasized if EGD is negative, that the recommendation would be to undergo colonoscopy to assess for other bleeding sources that would otherwise be missed if not performed. However, the patient still declines colonoscopy.  Monitor H&H.  Transfusion and resuscitation as per primary team Avoid frequent lab draws to prevent lab induced anemia Supportive care and antiemetics as per primary team Maintain two sites IV access Avoid nsaids Monitor for GIB.  Should hemodynamic instability recur, recommend stat CTA for potential IR and vascular intervention if positive.   Esophagogastroduodenoscopy with possible biopsy, control of bleeding, polypectomy, and interventions as necessary has been discussed with the patient/patient representative. Informed consent was obtained from the patient/patient representative after explaining the indication, nature, and risks of the procedure including but  not limited to death, bleeding, perforation, missed neoplasm/lesions, cardiorespiratory compromise, and reaction to medications. Opportunity for questions was given and appropriate answers were provided. Patient/patient representative has verbalized understanding is amenable to undergoing the procedure.   Further recommendations pending endoscopy.  Please see op report for further details  I personally performed the service.  Management of other medical comorbidities as per primary team  Thank you for allowing [place] to participate in this patient's care. Please don't hesitate to call if any questions or concerns arise.   [name], DO [facility]  Portions of the record may have been created with voice recognition software. Occasional wrong-word or 'sound-a-like' substitutions may have occurred due to the inherent limitations of voice recognition software.  Read the chart carefully and recognize, using context, where substitutions may have occurred.

## 2021-12-12 NOTE — Progress Notes (Signed)
PROGRESS NOTE    Rebecca Gilmore   WLS:[id number] DOB: 1940-08-08  DOA: 12/11/2021 Date of Service: 12/12/21 PCP: Philmore Pali, NP (Inactive)     Brief Narrative / Hospital Course:  Rebecca Gilmore is a 81 y.o. 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  She  presents to ED from home on 12/11/2021 with dark stool (intermittent x9-10 mos), chest pain x few hrs PTA, and leg edema. 10/05: Initially hypotensive with blood pressure 50s/40s, which improved to 117/39 after given 1 L of LR. Hemoglobin dropped from 8.9 on 11/24/2021 to 5.7 --> 4.9 today.  WBC 7.2, troponin level 42, 37, BNP 262, positive FOBT, temperature normal, heart rate 70, RR 18, oxygen saturation 97% - 100% on room air currently.  Creatinine 1.8.  Chest x-ray negative. Pt is admitted to telemetry bed as inpatient.  Dr. Virgina Jock of GI and Dr. Saralyn Pilar of cardiology consulted.  2 units PRBC.  Per GI: EGD tomorrow pending stability.  If hemodynamically unstable, recommend stat CTA for potential IR/vascular intervention.  Per cardiology: Elevated troponin and other cardiac issues likely related to significant anemia and hypotension/demand ischemia. 10/06: BP stable.  Hgb stable, 7.3 posttransfusion and 8.2 this morning. Creatinine improved at 1.5. EGD today (+)gastritis. Colonoscopy planned tomorrow.   Consultants:  Gastroenterology Cardiology  Procedures: EGD 12/12/21  --> hiatal hernia, gastritis Colonoscopy planned tomorrow 12/13/2021      ASSESSMENT & PLAN:   Principal Problem:   GI bleeding Active Problems:   Acute blood loss anemia   Iron deficiency anemia   Chronic diastolic CHF (congestive heart failure)  (HCC)   Acute renal failure superimposed on stage 3b chronic kidney disease (HCC)   Atrial fibrillation, chronic (HCC)   Femoral artery occlusion, left (HCC)   HLD (hyperlipidemia)   Myocardial injury   Temporal arteritis (HCC)   Depression   GI bleeding GI bleeding, acute blood loss anemia and history of iron deficiency anemia:  Acute blood loss anemia Hx iron deficiency anemia Hemoglobin dropped from 8.9 --> 5.7 --> 4.9.  Initially hypotensive which responded to IV fluid resuscitation.  Hgb stable over 8  today  Lab: INR, PTT and type screen, anemia panel - no concerns Status post 2 units PRBC, Hgb 8.2 this morning, continue to monitor CBC GI planning for EGD today --> hiatal hernia, gastritic Colonoscopy planned tomorrow  Continue pantoprazole 40 mg bid Zofran IV for nausea Avoid NSAIDs and SQ heparin Maintain IV access (2 large bore IVs if possible). Transfuse as necessary, if Hgb<7.0 Continue iron supplement hold Eliquis  Chronic diastolic CHF (congestive heart failure) ([place]) 2D echo on 11/17/2021 showed EF of 65-70% with grade 2 diastolic dysfunction.  Patient has elevated BNP 262, bilateral leg edema, but no shortness of breath, indicating mild fluid overload.   Due to GI bleed and hypotension, will not start diuretics. Hold Lasix due to hypotension and worsening renal function  Acute renal failure superimposed on stage 3b chronic kidney disease ([place]) Baseline creatinine 1.30 on 11/24/2021.  Her creatinine is 1.8, BUN 41, GFR 28. Likely due to volume depletion and continuation of diuretics Creatinine improved this morning to 1.5 Hold Lasix  Atrial fibrillation, chronic (HCC) Heart rate 70s, sinus. Continue metoprolol with holding parameters for blood pressure  Hold Eliquis  Femoral artery occlusion, left (HCC) Ongoing healing wound in left groin S/P Left femoral endarterectomy with thrombectomy due to critical limb ischemia. Patient still has ongoing healing wound in  the left groin area.  No fever or leukocytosis. Continue home doxycycline  HLD (hyperlipidemia) Crestor  Myocardial injury Myocardial injury due to severe anemia.  Consulted Dr. Saralyn Pilar of cardiology. Troponin level 42, 37. Held aspirin due to GI bleeding Continue Crestor Per cardio - no further diagnostics at this time continue current tx and hold apixiban   Temporal arteritis (HCC) Continue low-dose prednisone 2 mg daily  Depression Continue home medications    DVT prophylaxis: SCD, holding pharmacologic prophylaxis due to GI bleed Pertinent IV fluids/nutrition: N.p.o. pending EGD, no continuous IV fluids at this time Central lines / invasive devices: None  Code Status: FULL CODE Family Communication: none at this time  Disposition: inpatient TOC needs: none at this time, may need PT/OT eval for HH/SNF  Barriers to discharge / significant pending items: pending colonoscopy in AM and continue trend HH             Subjective:  Patient reports concern about urinary incontinence (stable) when she gets out of bed, doesn't want to make a mess here when she's getting up to use the bathroom w/ the colonoscopy prep. Reports leg pain (known infection). No CP/SOB. Feeling weak.        Objective:  Vitals:   12/12/21 0238 12/12/21 0830 12/12/21 1331 12/12/21 1445  BP: (!) 112/50 (!) 119/57 130/62 (!) 103/57  Pulse: 64 63 60 62  Resp: _0 Temp: 98 F (36.7 C) 98 F (36.7 C) 98 F (36.7 C) 98.1 F (36.7 C)  TempSrc: Oral Oral Oral   SpO2: 98% 98% 98% 98%  Weight:      Height:        Intake/Output Summary (Last 24 hours) at 12/12/2021 1621 Last data filed at 12/12/2021 0000 Gross per 24 hour  Intake 600 ml  Output --  Net 600 ml   Filed Weights   12/11/21 0714 12/11/21 2113  Weight: 106.6 kg 108.4 kg    Examination:  Constitutional:  VS as above General Appearance: alert, slightly pale, NAD Respiratory: Normal respiratory effort No  wheeze No rhonchi No rales Cardiovascular: S1/S2 normal No murmur No rub/gallop auscultated No lower extremity edema Gastrointestinal: No tenderness Musculoskeletal:  No clubbing/cyanosis of digits Symmetrical movement in all extremities Neurological: No cranial nerve deficit on limited exam Alert Psychiatric: Normal judgment/insight Normal mood and affect       Scheduled Medications:   amitriptyline  25 mg Oral QHS   cholecalciferol  1,000 Units Oral Daily   cyanocobalamin  1,000 mcg Oral Daily   doxycycline  100 mg Oral BID   ferrous sulfate  325 mg Oral Daily   metoprolol succinate  12.5 mg Oral Daily   pantoprazole (PROTONIX) IV  40 mg Intravenous Q12H   predniSONE  2 mg Oral Daily   pregabalin  75 mg Oral Daily   rosuvastatin  5 mg Oral Daily   sertraline  25 mg Oral Daily    Continuous Infusions:    PRN Medications:  acetaminophen, fentaNYL (SUBLIMAZE) injection, hydrALAZINE, loratadine, ondansetron (ZOFRAN) IV, polyethylene glycol-electrolytes  Antimicrobials:  Anti-infectives (From admission, onward)    Start     Dose/Rate Route Frequency Ordered Stop   12/11/21 1330  doxycycline (VIBRA-TABS) tablet 100 mg        100 mg Oral 2 times  daily 12/11/21 1322         Data Reviewed: I have personally reviewed following labs and imaging studies  CBC: Recent Labs  Lab 12/11/21 0903 12/11/21 0932 12/11/21 1634 12/11/21 2152 12/12/21 0303 12/12/21 0833  WBC 7.8 7.2 7.1  --  7.1 6.2  HGB 5.6* 4.9* 7.3* 8.6* 8.2* 8.5*  HCT 18.7* 16.6* 23.5* 26.4* 25.4* 25.6*  MCV 101.1* 103.1* 93.3  --  91.7 91.4  PLT 188 162 172  --  169 993   Basic Metabolic Panel: Recent Labs  Lab 12/11/21 0730 12/12/21 0303  NA 138 142  K 4.2 4.0  CL 102 109  CO2 26 28  GLUCOSE 97 81  BUN 41* 34*  CREATININE 1.80* 1.51*  CALCIUM 9.2 9.0   GFR: Estimated Creatinine Clearance: 35.1 mL/min (A) (by C-G formula based on SCr of 1.51 mg/dL (H)). Liver Function Tests: No  results for input(s): "AST", "ALT", "ALKPHOS", "BILITOT", "PROT", "ALBUMIN" in the last 168 hours. No results for input(s): "LIPASE", "AMYLASE" in the last 168 hours. No results for input(s): "AMMONIA" in the last 168 hours. Coagulation Profile: Recent Labs  Lab 12/11/21 0932 12/12/21 0303  INR 1.8* 1.4*   Cardiac Enzymes: No results for input(s): "CKTOTAL", "CKMB", "CKMBINDEX", "TROPONINI" in the last 168 hours. BNP (last 3 results) No results for input(s): "PROBNP" in the last 8760 hours. HbA1C: Recent Labs    12/11/21 0932  HGBA1C 4.1*   CBG: Recent Labs  Lab 12/12/21 0834  GLUCAP 82   Lipid Profile: Recent Labs    12/12/21 0303  CHOL 111  HDL 41  LDLCALC 52  TRIG 88  CHOLHDL 2.7   Thyroid Function Tests: No results for input(s): "TSH", "T4TOTAL", "FREET4", "T3FREE", "THYROIDAB" in the last 72 hours. Anemia Panel: Recent Labs    12/11/21 0903 12/11/21 0932 12/11/21 1634  VITAMINB12  --   --  910  FOLATE  --  9.4  --   FERRITIN  --  20  --   TIBC  --  322  --   IRON  --  56  --   RETICCTPCT 9.8*  --   --    Urine analysis: N/a Sepsis Labs: _0 (procalcitonin:4,lacticidven:4)  No results found for this or any previous visit (from the past 240 hour(s)).       Radiology Studies: DG Chest 2 View  Result Date: 12/11/2021 CLINICAL DATA:  Chest pain and shortness of breath. EXAM: CHEST - 2 VIEW COMPARISON:  11/19/2021 FINDINGS: Asymmetric elevation right hemidiaphragm The cardio pericardial silhouette is enlarged. The lungs are clear without focal pneumonia, edema, pneumothorax or pleural effusion. The visualized bony structures of the thorax are unremarkable. IMPRESSION: No active cardiopulmonary disease. Electronically Signed   By: Misty Stanley M.D.   On: 12/11/2021 07:53            LOS: 1 day    Emeterio Reeve, DO [facility] 12/12/2021, 4:21 PM   Staff may message me via secure chat in [place]  but this may not receive  immediate response,  please page for urgent matters!  If 7PM-7AM, please contact night-coverage [email or web address]  Dictation software was used to generate the above note. Typos may occur and escape review, as with typed/written notes. Please contact Dr Sheppard Coil directly for clarity if needed.

## 2021-12-12 NOTE — Transfer of Care (Signed)
Immediate Anesthesia Transfer of Care Note  Patient: [name]  Procedure(s) Performed: ESOPHAGOGASTRODUODENOSCOPY (EGD) WITH PROPOFOL  Patient Location: PACU  Anesthesia Type:General  Level of Consciousness: awake, alert  and oriented  Airway & Oxygen Therapy: Patient Spontanous Breathing  Post-op Assessment: Report given to RN and Post -op Vital signs reviewed and stable  Post vital signs: Reviewed and stable  Last Vitals:  Vitals Value Taken Time  BP 116/50 12/12/21 1243  Temp    Pulse 74 12/12/21 1243  Resp 24 12/12/21 1243  SpO2 98 % 12/12/21 1243  Vitals shown include unvalidated device data.  Last Pain:  Vitals:   12/12/21 0830  TempSrc: Oral  PainSc: 0-No pain         Complications: No notable events documented.

## 2021-12-12 NOTE — Hospital Course (Addendum)
Rebecca Gilmore is a 81 y.o. 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  She  presents to ED from home on 12/11/2021 with dark stool (intermittent x9-10 mos), chest pain x few hrs PTA, and leg edema. 10/05: Initially hypotensive with blood pressure 50s/40s, which improved to 117/39 after given 1 L of LR. Hemoglobin dropped from 8.9 on 11/24/2021 to 5.7 --> 4.9 today.  WBC 7.2, troponin level 42, 37, BNP 262, positive FOBT, temperature normal, heart rate 70, RR 18, oxygen saturation 97% - 100% on room air currently.  Creatinine 1.8.  Chest x-ray negative. Pt is admitted to telemetry bed as inpatient.  Dr. Virgina Jock of GI and Dr. Saralyn Pilar of cardiology consulted.  2 units PRBC.  Per GI: EGD tomorrow pending stability.  If hemodynamically unstable, recommend stat CTA for potential IR/vascular intervention.  Per cardiology: Elevated troponin and other cardiac issues likely related to significant anemia and hypotension/demand ischemia. 10/06: BP stable.  Hgb stable, 7.3 posttransfusion and 8.2 this morning. Creatinine improved at 1.5. EGD today (+)gastritis. Colonoscopy planned tomorrow.  10/07: H/H stable, Hgb 8.6 this morning. Colonoscopy  fair prep may miss small AVM but no overt bleeding or dark stool was noted. Plan stay thru Monday for capsule endoscopy (today is Saturday) on CLD.  10/08: Spoke on phone w/ Dr Virgina Jock - given [place] stable and brown stool on colonoscopy, unlikely to have small intestinal bleed, would recommend continue holding Eliquis and keep on iron supplements and PPI, option to defer capsule. D/w patient, she would like to stay for the capsule endoscopy.   Consultants:   Gastroenterology Cardiology  Procedures: EGD 12/12/21  --> hiatal hernia, gastritis Colonoscopy 12/13/2021 --> fair prep may miss small AVM but no overt bleeding or dark stool was noted. Consider outpatient capsule endoscopy       ASSESSMENT & PLAN:   Principal Problem:   GI bleeding Active Problems:   Acute blood loss anemia   Iron deficiency anemia   Chronic diastolic CHF (congestive heart failure) (HCC)   Acute renal failure superimposed on stage 3b chronic kidney disease (HCC)   Atrial fibrillation, chronic (HCC)   Femoral artery occlusion, left (HCC)   HLD (hyperlipidemia)   Myocardial injury   Temporal arteritis (HCC)   Depression   GI bleeding GI bleeding, acute blood loss anemia and history of iron deficiency anemia:  Acute blood loss anemia Hx iron deficiency anemia Hemoglobin dropped from 8.9 --> 5.7 --> 4.9.  Initially hypotensive which responded to IV fluid resuscitation.  Hgb stable over 8  today  Lab: INR, PTT and type screen, anemia panel - no concerns Status post 2 units PRBC, Hgb 8.2 this morning, continue to monitor CBC EGD 2 days ago --> hiatal hernia, gastritic Colonoscopy yesterday --> fair prep may miss small AVM but no overt bleeding or dark stool was noted.  Plan capsule endoscopy --> pt refusing this unless we can do it here but we can't arrange this until Monday. D/w Dr Vicente Males yesterday, can keep her here thru the weekend on CLD Continue pantoprazole 40 mg bid Zofran IV for nausea Avoid NSAIDs and Rx DVT PPx Maintain IV access (2 large bore IVs if possible). Transfuse as necessary, if Hgb<7.0 Continue iron supplement hold Eliquis Capsule endoscopy  pending   Chronic diastolic CHF (congestive heart failure) ([place]) - NO EXACERBATION 2D echo on 11/17/2021 showed EF of 65-70% with grade 2 diastolic dysfunction.  Patient has elevated BNP 262, bilateral leg edema, but no shortness of breath, indicating mild fluid overload.   Restarted Lasix on  discharge   Acute renal failure superimposed on stage 3b chronic kidney disease ([place]) - AKI RESOLVED Baseline creatinine 1.30 on 11/24/2021.  Her creatinine on admission was 1.8, BUN 41, GFR 28. Likely due to volume depletion and continuation of diuretics Creatinine improved now to 1.3  Atrial fibrillation, chronic (HCC) Heart rate controlled, sinus. Continue metoprolol with holding parameters for blood pressure  Hold Eliquis  Femoral artery occlusion, left (HCC) Ongoing healing wound in left groin S/P Left femoral endarterectomy with thrombectomy due to critical limb ischemia. Patient still has ongoing healing wound in the left groin area.  No fever or leukocytosis. Continue home doxycycline  HLD (hyperlipidemia) Crestor  Myocardial injury Myocardial injury due to severe anemia.  Consulted Dr. Saralyn Pilar of cardiology. Troponin level 42, 37. Held aspirin due to GI bleeding Continue Crestor Per cardio - no further diagnostics at this time continue current tx and hold apixiban   Temporal arteritis (HCC) Continue low-dose prednisone 2 mg daily  Depression Continue home medications

## 2021-12-13 ENCOUNTER — Inpatient Hospital Stay: Payer: Medicare Other | Admitting: Anesthesiology

## 2021-12-13 ENCOUNTER — Encounter
Admission: EM | Disposition: A | Discharge status: Home Health | Payer: Self-pay | Source: Home / Self Care | Attending: Osteopathic Medicine

## 2021-12-13 ENCOUNTER — Encounter: Payer: Self-pay | Admitting: Internal Medicine

## 2021-12-13 DIAGNOSIS — K921 Melena: Principal | ICD-10-CM

## 2021-12-13 DIAGNOSIS — D649 Anemia, unspecified: Secondary | ICD-10-CM

## 2021-12-13 HISTORY — PX: COLONOSCOPY WITH PROPOFOL: SHX5780

## 2021-12-13 LAB — CBC
HCT: 27.2 % — ABNORMAL LOW (ref 36.0–46.0)
Hemoglobin: 8.6 g/dL — ABNORMAL LOW (ref 12.0–15.0)
MCH: 29.7 pg (ref 26.0–34.0)
MCHC: 31.6 g/dL (ref 30.0–36.0)
MCV: 93.8 fL (ref 80.0–100.0)
Platelets: 175 10*3/uL (ref 150–400)
RBC: 2.9 MIL/uL — ABNORMAL LOW (ref 3.87–5.11)
RDW: 19.3 % — ABNORMAL HIGH (ref 11.5–15.5)
WBC: 6 10*3/uL (ref 4.0–10.5)
nRBC: 0 % (ref 0.0–0.2)

## 2021-12-13 LAB — GLUCOSE, CAPILLARY
Glucose-Capillary: 83 mg/dL (ref 70–99)
Glucose-Capillary: 70 mg/dL (ref 70–99)

## 2021-12-13 LAB — HEMOGLOBIN AND HEMATOCRIT, BLOOD
HCT: 28.2 % — ABNORMAL LOW (ref 36.0–46.0)
Hemoglobin: 8.8 g/dL — ABNORMAL LOW (ref 12.0–15.0)

## 2021-12-13 LAB — BASIC METABOLIC PANEL
Anion gap: 6 (ref 5–15)
BUN: 25 mg/dL — ABNORMAL HIGH (ref 8–23)
CO2: 27 mmol/L (ref 22–32)
Calcium: 9 mg/dL (ref 8.9–10.3)
Chloride: 109 mmol/L (ref 98–111)
Creatinine, Ser: 1.43 mg/dL — ABNORMAL HIGH (ref 0.44–1.00)
GFR, Estimated: 37 mL/min — ABNORMAL LOW (ref >60)
Glucose, Bld: 89 mg/dL (ref 70–99)
Potassium: 4 mmol/L (ref 3.5–5.1)
Sodium: 142 mmol/L (ref 135–145)

## 2021-12-13 SURGERY — COLONOSCOPY WITH PROPOFOL
Anesthesia: General

## 2021-12-13 MED ORDER — LIDOCAINE HCL (CARDIAC) PF 100 MG/5ML IV SOSY
PREFILLED_SYRINGE | INTRAVENOUS | Status: DC | PRN
  Administered 2021-12-13: 100 mg via INTRAVENOUS

## 2021-12-13 MED ORDER — SODIUM CHLORIDE 0.9 % IV SOLN: INTRAVENOUS | Status: DC

## 2021-12-13 MED ORDER — PROPOFOL 1000 MG/100ML IV EMUL
INTRAVENOUS | Status: AC
  Filled 2021-12-13: qty 100

## 2021-12-13 MED ORDER — SODIUM CHLORIDE 0.9 % IV SOLN: INTRAVENOUS | Status: DC | PRN

## 2021-12-13 MED ORDER — PROPOFOL 500 MG/50ML IV EMUL
INTRAVENOUS | Status: DC | PRN
  Administered 2021-12-13: 150 ug/kg/min via INTRAVENOUS
  Administered 2021-12-13: 80 mg via INTRAVENOUS

## 2021-12-13 NOTE — Anesthesia Preprocedure Evaluation (Signed)
Anesthesia Evaluation  Patient identified by MRN, date of birth, ID band Patient awake    Reviewed: Allergy & Precautions, NPO status , Patient's Chart, lab work & pertinent test results  History of Anesthesia Complications Negative for: history of anesthetic complications  Airway Mallampati: III  TM Distance: >3 FB Neck ROM: full    Dental  (+) Chipped, Poor Dentition, Missing   Pulmonary neg shortness of breath, former smoker   Pulmonary exam normal        Cardiovascular Exercise Tolerance: Good hypertension, (-) angina Normal cardiovascular exam     Neuro/Psych  PSYCHIATRIC DISORDERS      negative neurological ROS     GI/Hepatic negative GI ROS, Neg liver ROS,,,  Endo/Other  negative endocrine ROS    Renal/GU Renal disease  negative genitourinary   Musculoskeletal   Abdominal   Peds  Hematology negative hematology ROS (+)   Anesthesia Other Findings Past Medical History: No date: Depression No date: HBP (high blood pressure) No date: History of bladder problems No date: Memory loss No date: Muscle pain No date: Osteoporosis No date: Reflux No date: Sinus congestion No date: Swelling     Comment:  B/L FEET AND LEGS  Past Surgical History: 12/01/2016: ARTERY BIOPSY; Bilateral     Comment:  Procedure: BIOPSY TEMPORAL ARTERY;  Surgeon: Conrad [place], MD;  Location: [facility] OR;  Service: Vascular;  Laterality:              Bilateral; No date: CATARACT SURGERY No date: EYELID SURGERY; Bilateral No date: GROWTH ON FACE No date: KNEE SURGERY; Right 11/16/2021: THROMBECTOMY FEMORAL ARTERY; Left     Comment:  Procedure: THROMBECTOMY FEMORAL ARTERY;  Surgeon: Conrad [place], MD;  Location: [facility] ORS;  Service: Vascular;                Laterality: Left;  BMI    Body Mass Index: 39.14 kg/m      Reproductive/Obstetrics negative OB ROS                              Anesthesia Physical Anesthesia Plan  ASA: 3  Anesthesia Plan: General   Post-op Pain Management:    Induction: Intravenous  PONV Risk Score and Plan: Propofol infusion and TIVA  Airway Management Planned: Natural Airway and Nasal Cannula  Additional Equipment:   Intra-op Plan:   Post-operative Plan:   Informed Consent: I have reviewed the patients History and Physical, chart, labs and discussed the procedure including the risks, benefits and alternatives for the proposed anesthesia with the patient or authorized representative who has indicated his/her understanding and acceptance.     Dental Advisory Given  Plan Discussed with: Anesthesiologist, CRNA and Surgeon  Anesthesia Plan Comments: (Patient consented for risks of anesthesia including but not limited to:  - adverse reactions to medications - risk of airway placement if required - damage to eyes, teeth, lips or other oral mucosa - nerve damage due to positioning  - sore throat or hoarseness - Damage to heart, brain, nerves, lungs, other parts of body or loss of life  Patient voiced understanding.)       Anesthesia Quick Evaluation

## 2021-12-13 NOTE — Progress Notes (Signed)
PROGRESS NOTE    Rebecca Gilmore   BMW:[id number] DOB: 10-15-1940  DOA: 12/11/2021 Date of Service: 12/13/21 PCP: Philmore Pali, NP (Inactive)     Brief Narrative / Hospital Course:  Rebecca Gilmore is a 81 y.o. 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  She  presents to ED from home on 12/11/2021 with dark stool (intermittent x9-10 mos), chest pain x few hrs PTA, and leg edema. 10/05: Initially hypotensive with blood pressure 50s/40s, which improved to 117/39 after given 1 L of LR. Hemoglobin dropped from 8.9 on 11/24/2021 to 5.7 --> 4.9 today.  WBC 7.2, troponin level 42, 37, BNP 262, positive FOBT, temperature normal, heart rate 70, RR 18, oxygen saturation 97% - 100% on room air currently.  Creatinine 1.8.  Chest x-ray negative. Pt is admitted to telemetry bed as inpatient.  Dr. Virgina Jock of GI and Dr. Saralyn Pilar of cardiology consulted.  2 units PRBC.  Per GI: EGD tomorrow pending stability.  If hemodynamically unstable, recommend stat CTA for potential IR/vascular intervention.  Per cardiology: Elevated troponin and other cardiac issues likely related to significant anemia and hypotension/demand ischemia. 10/06: BP stable.  Hgb stable, 7.3 posttransfusion and 8.2 this morning. Creatinine improved at 1.5. EGD today (+)gastritis. Colonoscopy planned tomorrow.  10/07: H/H stable, Hgb 8.6 this morning. Colonoscopy  fair prep may miss small AVM but no overt bleeding or dark stool was noted. Plan stay thru Monday for capsule endoscopy (today is Saturday) on CLD.   Consultants:  Gastroenterology Cardiology  Procedures: EGD 12/12/21  --> hiatal hernia, gastritis Colonoscopy 12/13/2021 -->  fair prep may miss small AVM but no overt bleeding or dark stool was noted. Consider outpatient capsule endoscopy       ASSESSMENT & PLAN:   Principal Problem:   GI bleeding Active Problems:   Acute blood loss anemia   Iron deficiency anemia   Chronic diastolic CHF (congestive heart failure) (HCC)   Acute renal failure superimposed on stage 3b chronic kidney disease (HCC)   Atrial fibrillation, chronic (HCC)   Femoral artery occlusion, left (HCC)   HLD (hyperlipidemia)   Myocardial injury   Temporal arteritis (HCC)   Depression   GI bleeding GI bleeding, acute blood loss anemia and history of iron deficiency anemia:  Acute blood loss anemia Hx iron deficiency anemia Hemoglobin dropped from 8.9 --> 5.7 --> 4.9.  Initially hypotensive which responded to IV fluid resuscitation.  Hgb stable over 8  today  Lab: INR, PTT and type screen, anemia panel - no concerns Status post 2 units PRBC, Hgb 8.2 this morning, continue to monitor CBC EGD yesterday --> hiatal hernia, gastritic Colonoscopy today --> fair prep may miss small AVM but no overt bleeding or dark stool was noted.  Consider outpatient capsule endoscopy --> pt refusing this unless we can do it here but we can't arrange this until Monday. D/w Dr Vicente Males, can keep her here thru the weekend on CLD Continue pantoprazole 40 mg bid Zofran IV for nausea Avoid NSAIDs and Rx DVT PPx Maintain IV access (2 large bore IVs if possible). Transfuse as necessary, if Hgb<7.0 Continue iron supplement hold Eliquis  Chronic diastolic CHF (congestive heart failure) ([place]) 2D echo on 11/17/2021 showed EF of 65-70% with grade  2 diastolic dysfunction.  Patient has elevated BNP 262, bilateral leg edema, but no shortness of breath, indicating mild fluid overload.   Due to GI bleed and hypotension, will not start diuretics. Hold Lasix due to hypotension and worsening renal function  Acute renal failure superimposed on stage 3b chronic kidney disease  ([place]) Baseline creatinine 1.30 on 11/24/2021.  Her creatinine on admission was 1.8, BUN 41, GFR 28. Likely due to volume depletion and continuation of diuretics Creatinine improved yesterday to 1.5 Hold Lasix  Atrial fibrillation, chronic (HCC) Heart rate controlled, sinus. Continue metoprolol with holding parameters for blood pressure  Hold Eliquis  Femoral artery occlusion, left (HCC) Ongoing healing wound in left groin S/P Left femoral endarterectomy with thrombectomy due to critical limb ischemia. Patient still has ongoing healing wound in the left groin area.  No fever or leukocytosis. Continue home doxycycline  HLD (hyperlipidemia) Crestor  Myocardial injury Myocardial injury due to severe anemia.  Consulted Dr. Saralyn Pilar of cardiology. Troponin level 42, 37. Held aspirin due to GI bleeding Continue Crestor Per cardio - no further diagnostics at this time continue current tx and hold apixiban   Temporal arteritis (HCC) Continue low-dose prednisone 2 mg daily  Depression Continue home medications    DVT prophylaxis: SCD, holding pharmacologic prophylaxis due to GI bleed Pertinent IV fluids/nutrition: no continuous IV fluids at this time. Clear liquids until capsule endoscopy  Central lines / invasive devices: None  Code Status: FULL CODE Family Communication: none at this time  Disposition: inpatient TOC needs: none at this time, may need PT/OT eval for HH/SNF when more stable Barriers to discharge / significant pending items: pending capsule endoscopy Monday (today is Saturday)              Subjective:  Patient denies CP/SOB, abd pain. NO bleeding. No dizzy/lightheaded.        Objective:  Vitals:   12/13/21 0952 12/13/21 1002 12/13/21 1012 12/13/21 1123  BP: (!) 92/24 (!) 131/40 108/64 (!) 115/50  Pulse: 61 66 (!) 59 (!) 57  Resp: _0 Temp: (!) 97.2 F (36.2 C)   98.4 F (36.9 C)  TempSrc: Temporal   Oral  SpO2: 100% 99% 100% 100%   Weight:      Height:        Intake/Output Summary (Last 24 hours) at 12/13/2021 1303 Last data filed at 12/12/2021 2341 Gross per 24 hour  Intake 480 ml  Output --  Net 480 ml   Filed Weights   12/11/21 2113 12/13/21 0603 12/13/21 0900  Weight: 108.4 kg 103.4 kg 103.4 kg    Examination:  Constitutional:  VS as above General Appearance: alert, slightly pale, NAD Respiratory: Normal respiratory effort No wheeze No rhonchi No rales Cardiovascular: S1/S2 normal No murmur No rub/gallop auscultated No lower extremity edema Gastrointestinal: No tenderness Musculoskeletal:  No clubbing/cyanosis of digits Symmetrical movement in all extremities Neurological: No cranial nerve deficit on limited exam Alert Psychiatric: Normal judgment/insight Normal mood and affect       Scheduled Medications:   amitriptyline  25 mg Oral QHS   cholecalciferol  1,000 Units Oral Daily   cyanocobalamin  1,000 mcg Oral Daily   doxycycline  100 mg Oral BID   ferrous sulfate  325 mg Oral Daily   metoprolol succinate  12.5 mg Oral Daily   pantoprazole (PROTONIX) IV  40 mg Intravenous Q12H   predniSONE  2 mg Oral Daily   pregabalin  75 mg Oral Daily  rosuvastatin  5 mg Oral Daily   sertraline  25 mg Oral Daily    Continuous Infusions:    PRN Medications:  acetaminophen, fentaNYL (SUBLIMAZE) injection, hydrALAZINE, loratadine, ondansetron (ZOFRAN) IV, polyethylene glycol-electrolytes  Antimicrobials:  Anti-infectives (From admission, onward)    Start     Dose/Rate Route Frequency Ordered Stop   12/11/21 1330  doxycycline (VIBRA-TABS) tablet 100 mg        100 mg Oral 2 times daily 12/11/21 1322         Data Reviewed: I have personally reviewed following labs and imaging studies  CBC: Recent Labs  Lab 12/11/21 0932 12/11/21 1634 12/11/21 2152 12/12/21 0303 12/12/21 0833 12/12/21 1841 12/13/21 0623  WBC 7.2 7.1  --  7.1 6.2  --  6.0  HGB 4.9* 7.3* 8.6* 8.2* 8.5*  10.3* 8.6*  HCT 16.6* 23.5* 26.4* 25.4* 25.6* 32.4* 27.2*  MCV 103.1* 93.3  --  91.7 91.4  --  93.8  PLT 162 172  --  169 155  --  947   Basic Metabolic Panel: Recent Labs  Lab 12/11/21 0730 12/12/21 0303 12/13/21 0623  NA 138 142 142  K 4.2 4.0 4.0  CL 102 109 109  CO2 _0 GLUCOSE 97 81 89  BUN 41* 34* 25*  CREATININE 1.80* 1.51* 1.43*  CALCIUM 9.2 9.0 9.0   GFR: Estimated Creatinine Clearance: 36.1 mL/min (A) (by C-G formula based on SCr of 1.43 mg/dL (H)). Liver Function Tests: No results for input(s): "AST", "ALT", "ALKPHOS", "BILITOT", "PROT", "ALBUMIN" in the last 168 hours. No results for input(s): "LIPASE", "AMYLASE" in the last 168 hours. No results for input(s): "AMMONIA" in the last 168 hours. Coagulation Profile: Recent Labs  Lab 12/11/21 0932 12/12/21 0303  INR 1.8* 1.4*   Cardiac Enzymes: No results for input(s): "CKTOTAL", "CKMB", "CKMBINDEX", "TROPONINI" in the last 168 hours. BNP (last 3 results) No results for input(s): "PROBNP" in the last 8760 hours. HbA1C: Recent Labs    12/11/21 0932  HGBA1C 4.1*   CBG: Recent Labs  Lab 12/12/21 0834 12/13/21 0759 12/13/21 1202  GLUCAP 82 83 70   Lipid Profile: Recent Labs    12/12/21 0303  CHOL 111  HDL 41  LDLCALC 52  TRIG 88  CHOLHDL 2.7   Thyroid Function Tests: No results for input(s): "TSH", "T4TOTAL", "FREET4", "T3FREE", "THYROIDAB" in the last 72 hours. Anemia Panel: Recent Labs    12/11/21 0903 12/11/21 0932 12/11/21 1634  VITAMINB12  --   --  910  FOLATE  --  9.4  --   FERRITIN  --  20  --   TIBC  --  322  --   IRON  --  56  --   RETICCTPCT 9.8*  --   --    Urine analysis: N/a Sepsis Labs: _1 (procalcitonin:4,lacticidven:4)  No results found for this or any previous visit (from the past 240 hour(s)).       Radiology Studies: DG Chest 2 View  Result Date: 12/11/2021 CLINICAL DATA:  Chest pain and shortness of breath. EXAM: CHEST - 2 VIEW COMPARISON:   11/19/2021 FINDINGS: Asymmetric elevation right hemidiaphragm The cardio pericardial silhouette is enlarged. The lungs are clear without focal pneumonia, edema, pneumothorax or pleural effusion. The visualized bony structures of the thorax are unremarkable. IMPRESSION: No active cardiopulmonary disease. Electronically Signed   By: Misty Stanley M.D.   On: 12/11/2021 07:53            LOS: 2 days  Emeterio Reeve, DO [facility] 12/13/2021, 1:03 PM   Staff may message me via secure chat in [place]  but this may not receive immediate response,  please page for urgent matters!  If 7PM-7AM, please contact night-coverage [email or web address]  Dictation software was used to generate the above note. Typos may occur and escape review, as with typed/written notes. Please contact Dr Sheppard Coil directly for clarity if needed.

## 2021-12-13 NOTE — H&P (Signed)
Rebecca Bellows, MD [address] [place], [place], [place], [address] [address], [place], [place], [place], [address] Phone: [phone]  Fax: [phone]  Primary Care Physician:  Philmore Pali, NP (Inactive)   Pre-Procedure History & Physical: HPI:  Rebecca Gilmore is a 81 y.o. female is here for an colonoscopy.   Past Medical History:  Diagnosis Date   Depression    HBP (high blood pressure)    History of bladder problems    Memory loss    Muscle pain    Osteoporosis    Reflux    Sinus congestion    Swelling    B/L FEET AND LEGS    Past Surgical History:  Procedure Laterality Date   ARTERY BIOPSY Bilateral 12/01/2016   Procedure: BIOPSY TEMPORAL ARTERY;  Surgeon: Conrad [place], MD;  Location: [facility];  Service: Vascular;  Laterality: Bilateral;   CATARACT SURGERY     EYELID SURGERY Bilateral    GROWTH ON FACE     KNEE SURGERY Right    THROMBECTOMY FEMORAL ARTERY Left 11/16/2021   Procedure: THROMBECTOMY FEMORAL ARTERY;  Surgeon: Conrad [place], MD;  Location: [facility] ORS;  Service: Vascular;  Laterality: Left;    Prior to Admission medications   Medication Sig Start Date End Date Taking? Authorizing Provider  apixaban (ELIQUIS) 5 MG TABS tablet Take 1 tablet (5 mg total) by mouth 2 (two) times daily. 11/27/21  Yes Kris Hartmann, NP  cholecalciferol (VITAMIN D3) 25 MCG (1000 UT) tablet Take 1,000 Units by mouth daily.   Yes [provider]  cyanocobalamin (VITAMIN B12) 1000 MCG tablet Take 1,000 mcg by mouth.   Yes [provider]  doxycycline (VIBRAMYCIN) 100 MG capsule Take 1 capsule (100 mg total) by mouth 2 (two) times daily. 12/09/21  Yes Kris Hartmann, NP  Ferrous Sulfate (IRON) 325 (65 Fe) MG TABS Take 1 tablet by mouth daily. 09/07/21  Yes [provider]  furosemide (LASIX) 40 MG tablet Take 40 mg by mouth daily. For fluid retention (swollen ankles) 10/14/16  Yes [provider]  metoprolol succinate (TOPROL-XL) 25 MG 24 hr  tablet Take 12.5 mg by mouth daily. 09/14/21  Yes [provider]  pantoprazole (PROTONIX) 40 MG tablet Take 40 mg by mouth every morning. 10/01/21  Yes [provider]  predniSONE (DELTASONE) 1 MG tablet Take 2 mg by mouth daily. 09/23/21  Yes [provider]  pregabalin (LYRICA) 75 MG capsule Take 1 capsule (75 mg total) by mouth daily. 11/25/21  Yes Kris Hartmann, NP  rosuvastatin (CRESTOR) 5 MG tablet Take 5 mg by mouth daily. 04/06/19  Yes [provider]  sertraline (ZOLOFT) 50 MG tablet Take 25 mg by mouth daily. 10/14/17  Yes [provider]  amitriptyline (ELAVIL) 25 MG tablet Take 25 mg by mouth at bedtime. 07/06/21   [provider]  apixaban (ELIQUIS) 5 MG TABS tablet Take 2 tablets (10 mg total) by mouth 2 (two) times daily. Patient not taking: Reported on 12/11/2021 11/25/21   Kris Hartmann, NP  loratadine (CLARITIN) 10 MG tablet Take 10 mg by mouth daily.    [provider]  oxyCODONE-acetaminophen (PERCOCET/ROXICET) 5-325 MG tablet Take 1 tablet by mouth every 6 (six) hours as needed for moderate pain. Patient not taking: Reported on 12/11/2021 11/25/21   Kris Hartmann, NP    Allergies as of 12/11/2021 - Review Complete 12/11/2021  Allergen Reaction Noted   Morphine and  related Itching 11/22/2021   Kenalog [triamcinolone acetonide] Other (See Comments) 12/12/2012   Penicillins Other (See Comments) 12/06/2012    Family History  Problem Relation Age of Onset   Heart disease Mother    AAA (abdominal aortic aneurysm) Mother    Heart disease Father     Social History   Socioeconomic History   Marital status: Married    Spouse name: Not on file   Number of children: Not on file   Years of education: Not on file   Highest education level: Not on file  Occupational History   Not on file  Tobacco Use   Smoking status: Former    Types: Cigarettes    Quit date: 01/12/1993    Years since quitting: 28.9   Smokeless  tobacco: Former    Types: Snuff, Chew   Tobacco comments:    DATE QUIT UNKNOWN  Vaping Use   Vaping Use: Never used  Substance and Sexual Activity   Alcohol use: No   Drug use: No   Sexual activity: Not on file  Other Topics Concern   Not on file  Social History Narrative   Not on file   Social Determinants of Health   Financial Resource Strain: Not on file  Food Insecurity: No Food Insecurity (12/11/2021)   Hunger Vital Sign    Worried About Running Out of Food in the Last Year: Never true    Ran Out of Food in the Last Year: Never true  Transportation Needs: No Transportation Needs (12/11/2021)   PRAPARE - Hydrologist (Medical): No    Lack of Transportation (Non-Medical): No  Physical Activity: Not on file  Stress: Not on file  Social Connections: Not on file  Intimate Partner Violence: Not At Risk (12/11/2021)   Humiliation, Afraid, Rape, and Kick questionnaire    Fear of Current or Ex-Partner: No    Emotionally Abused: No    Physically Abused: No    Sexually Abused: No    Review of Systems: See HPI, otherwise negative ROS  Physical Exam: BP (!) 133/46   Pulse 62   Temp (!) 96.9 F (36.1 C) (Temporal)   Resp 18   Ht _0  (1.626 m)   Wt 103.4 kg   SpO2 100%   BMI 39.14 kg/m  General:   Alert,  pleasant and cooperative in NAD Head:  Normocephalic and atraumatic. Neck:  Supple; no masses or thyromegaly. Lungs:  Clear throughout to auscultation, normal respiratory effort.    Heart:  +S1, +S2, Regular rate and rhythm, No edema. Abdomen:  Soft, nontender and nondistended. Normal bowel sounds, without guarding, and without rebound.   Neurologic:  Alert and  oriented x4;  grossly normal neurologically.  Impression/Plan: [name] is here for an colonoscopy to be performed for gi bleeding    Risks, benefits, limitations, and alternatives regarding  colonoscopy have been reviewed with the patient.  Questions have been answered.   All parties agreeable.   Rebecca Bellows, MD  12/13/2021, 9:23 AM

## 2021-12-13 NOTE — Progress Notes (Signed)
[facility] Patient ID: Rebecca Gilmore MRN: [id number] DOB/AGE: 04/26/1940 81 y.o.   Admit date: 12/11/2021 Referring Physician Dr. Blaine Hamper Primary Physician  Primary Cardiologist Dr. 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  Cardiology is consulted for further assistance.  Interval history: -S/p EGD 10/6 which revealed a hiatal hernia and gastritis, colonoscopy 10/7 without overt bleeding although dark stools noted, outpatient capsule endoscopy recommended. -Somewhat frustrated there has been no answer to the source of her bleeding -Denies further chest pain, shortness of breath, peripheral edema, palpitations   Vitals:   12/13/21 0952 12/13/21 1002 12/13/21 1012 12/13/21 1123  BP: (!) 92/24 (!) 131/40 108/64 (!) 115/50  Pulse: 61 66 (!) 59 (!) 57  Resp: _0 Temp: (!) 97.2 F (36.2 C)   98.4 F (36.9 C)  TempSrc: Temporal   Oral  SpO2: 100% 99% 100% 100%  Weight:      Height:         Intake/Output Summary (Last 24 hours) at 12/13/2021 1454 Last data filed at 12/12/2021 2341 Gross per 24 hour  Intake 480 ml  Output --  Net 480 ml     PHYSICAL EXAM General: Pleasant conversational elderly Caucasian female, well nourished, in no acute distress.  Sitting upright in hospital bed HEENT:  Normocephalic  and atraumatic. Neck:   No JVD.  Lungs: Normal respiratory effort on room air. Clear bilaterally to auscultation. No wheezes, crackles, rhonchi.  Heart: HRRR . Normal S1 and S2 without gallops.  3/6 systolic murmur heard throughout  Abdomen: Obese appearing.  Msk: Normal strength and tone for age. Extremities: Warm and well perfused. No clubbing, cyanosis.  No peripheral edema neuro: Alert and oriented X 3. Psych:  Answers questions appropriately.      LABS: Basic Metabolic Panel: Recent Labs    12/12/21 0303 12/13/21 0623  NA 142 142  K 4.0 4.0  CL 109 109  CO2 28 27  GLUCOSE 81 89  BUN 34* 25*  CREATININE 1.51* 1.43*  CALCIUM 9.0 9.0    Liver Function Tests: No results for input(s): "AST", "ALT", "ALKPHOS", "BILITOT", "PROT", "ALBUMIN" in the last 72 hours. No results for input(s): "LIPASE", "AMYLASE" in the last 72 hours. CBC: Recent Labs    12/12/21 0833 12/12/21 1841 12/13/21 0623  WBC 6.2  --  6.0  HGB 8.5* 10.3* 8.6*  HCT 25.6* 32.4* 27.2*  MCV 91.4  --  93.8  PLT 155  --  175    Cardiac Enzymes: No results for input(s): "CKTOTAL", "CKMB", "CKMBINDEX", "TROPONINI" in the last 72 hours. BNP: Invalid input(s): "POCBNP" D-Dimer: No results for input(s): "DDIMER" in the last 72 hours. Hemoglobin A1C: Recent Labs    12/11/21 0932  HGBA1C 4.1*    Fasting Lipid Panel: Recent Labs    12/12/21 0303  CHOL 111  HDL 41  LDLCALC 52  TRIG 88  CHOLHDL 2.7    Thyroid Function Tests: No results for input(s): "TSH", "T4TOTAL", "T3FREE", "THYROIDAB" in the last 72 hours.  Invalid input(s): "FREET3" Anemia Panel: Recent Labs    12/11/21 0903 12/11/21 0932 12/11/21 1634  VITAMINB12  --   --  910  FOLATE  --  9.4  --   FERRITIN  --  20  --   TIBC  --  322  --   IRON  --  56  --   RETICCTPCT 9.8*  --   --      No results found.   Echo EF 65-70% 11/17/2021, moderate aortic stenosis, mild to moderate aortic insufficiency  TELEMETRY: Sinus rhythm  65 bpm:  ASSESSMENT AND PLAN:  Principal Problem:   GI bleeding Active Problems:   Femoral artery occlusion, left (HCC)   Depression   HLD (hyperlipidemia)   Iron deficiency anemia   Chronic diastolic CHF (congestive heart failure) (HCC)   Acute renal failure superimposed on stage 3b chronic kidney disease (HCC)   Myocardial injury   Atrial fibrillation, chronic (HCC)   Temporal arteritis (HCC)   Acute blood loss anemia   Anemia    1.  Borderline elevated troponin, likely demand supply ischemia due to GI bleed and marked anemia 2.  Coronary artery disease, history of NSTEMI 03/05/2021 with diffuse RCA, 80% stenosis ostial RCA, 56% stenosis proximal LAD left heart cath/20 11/2020 at [facility] treated medically 3.  Paroxysmal atrial fibrillation/atrial flutter, initial occurrence in the setting of severe sepsis 02/2021, currently in sinus rhythm, not on chronic anticoagulation light of current GI bleed 4.  GI bleed, hemoglobin 8.5 and 25.6, respectively, EGD unremarkable, colonoscopy performed 12/13/2021 5.  HFpEF, 65-70% 11/17/2021, stable peripheral edema 6.  Peripheral vascular disease, status post left iliofemoral endarterectomy and patch angioplasty 11/16/2021  Recommendations  1.  Continue current therapy 2.  Continue to hold apixaban 3.  Continue metoprolol succinate 4.  Continue rosuvastatin 5.  Defer further cardiac diagnostics at this time  Cardiology will sign off.  Please reengage if needed  This patient's plan of care was discussed and created with Dr. Saralyn Pilar and he is in agreement.    Tristan Schroeder, PA-C 12/13/2021 2:54 PM

## 2021-12-13 NOTE — Transfer of Care (Signed)
Immediate Anesthesia Transfer of Care Note  Patient: Quenna Doepke  Procedure(s) Performed: COLONOSCOPY WITH PROPOFOL  Patient Location: PACU and Endoscopy Unit  Anesthesia Type:MAC  Level of Consciousness: drowsy  Airway & Oxygen Therapy: Patient Spontanous Breathing  Post-op Assessment: Report given to RN  Post vital signs: stable  Last Vitals:  Vitals Value Taken Time  BP 92/42   Temp 97.28F   Pulse 61   Resp 18   SpO2 100     Last Pain:  Vitals:   12/13/21 0900  TempSrc: Temporal  PainSc: 0-No pain         Complications: No notable events documented.

## 2021-12-13 NOTE — Anesthesia Postprocedure Evaluation (Signed)
Anesthesia Post Note  Patient: Ayden Apodaca  Procedure(s) Performed: COLONOSCOPY WITH PROPOFOL  Patient location during evaluation: Endoscopy Anesthesia Type: General Level of consciousness: awake and alert Pain management: pain level controlled Vital Signs Assessment: post-procedure vital signs reviewed and stable Respiratory status: spontaneous breathing, nonlabored ventilation, respiratory function stable and patient connected to nasal cannula oxygen Cardiovascular status: blood pressure returned to baseline and stable Postop Assessment: no apparent nausea or vomiting Anesthetic complications: no   No notable events documented.   Last Vitals:  Vitals:   12/13/21 1012 12/13/21 1123  BP: 108/64 (!) 115/50  Pulse: (!) 59 (!) 57  Resp: 18 20  Temp:  36.9 C  SpO2: 100% 100%    Last Pain:  Vitals:   12/13/21 1123  TempSrc: Oral  PainSc:                  Precious Haws Andreika Vandagriff

## 2021-12-13 NOTE — Op Note (Signed)
[facility] Patient Name: Rebecca Gilmore Procedure Date: 12/13/2021 9:05 AM MRN: [id number] Account #: [id number] Date of Birth: 17-Feb-1941 Admit Type: Outpatient Age: 81 Room: [facility] ENDO ROOM 4 Gender: Female Note Status: Finalized Instrument Name: Jasper Riling [id number] Procedure:             Colonoscopy Indications:           Hematochezia Providers:             Jonathon Bellows MD, MD Medicines:             Monitored Anesthesia Care Complications:         No immediate complications. Procedure:             Pre-Anesthesia Assessment:                        - Prior to the procedure, a History and Physical was                         performed, and patient medications, allergies and                         sensitivities were reviewed. The patient's tolerance                         of previous anesthesia was reviewed.                        - The risks and benefits of the procedure and the                         sedation options and risks were discussed with the                         patient. All questions were answered and informed                         consent was obtained.                        - ASA Grade Assessment: III - A patient with severe                         systemic disease.                        After obtaining informed consent, the colonoscope was                         passed under direct vision. Throughout the procedure,                         the patient's blood pressure, pulse, and oxygen                         saturations were monitored continuously. The                         Colonoscope was introduced through the anus and  advanced to the the cecum, identified by the                         appendiceal orifice. The colonoscopy was performed                         with ease. The patient tolerated the procedure well.                         The quality of the bowel preparation was fair. Findings:       The perianal and digital rectal examinations were normal.      The entire examined colon appeared normal on direct and retroflexion       views. Impression:            - Preparation of the colon was fair.                        - The entire examined colon is normal on direct and                         retroflexion views.                        - No specimens collected. Recommendation:        - Return patient to hospital ward for ongoing care.                        - Advance diet as tolerated.                        - No blood or dark stool seen anywhere in colon                         indicating no active bleeding . Prep was fair hence                         small AVM's may be missed.                        Suggest to have a dicussion with Dr Virgina Jock in the                         outpatient about a possible capsule study of the small                         bowel Procedure Code(s):     --- Professional ---                        [phone], Colonoscopy, flexible; diagnostic, including                         collection of specimen(s) by brushing or washing, when                         performed (separate procedure) Diagnosis Code(s):     --- Professional ---  K92.1, Melena (includes Hematochezia) CPT copyright 2019 [employer]. All rights reserved. The codes documented in this report are preliminary and upon coder review may  be revised to meet current compliance requirements. Jonathon Bellows, MD Jonathon Bellows MD, MD 12/13/2021 9:53:20 AM This report has been signed electronically. Number of Addenda: 0 Note Initiated On: 12/13/2021 9:05 AM Scope Withdrawal Time: 0 hours 6 minutes 37 seconds  Total Procedure Duration: 0 hours 10 minutes 3 seconds  Estimated Blood Loss:  Estimated blood loss: none.      [facility]

## 2021-12-14 LAB — CBC
HCT: 28.5 % — ABNORMAL LOW (ref 36.0–46.0)
Hemoglobin: 8.9 g/dL — ABNORMAL LOW (ref 12.0–15.0)
MCH: 30.1 pg (ref 26.0–34.0)
MCHC: 31.2 g/dL (ref 30.0–36.0)
MCV: 96.3 fL (ref 80.0–100.0)
Platelets: 173 10*3/uL (ref 150–400)
RBC: 2.96 MIL/uL — ABNORMAL LOW (ref 3.87–5.11)
RDW: 18.6 % — ABNORMAL HIGH (ref 11.5–15.5)
WBC: 5 10*3/uL (ref 4.0–10.5)
nRBC: 0 % (ref 0.0–0.2)

## 2021-12-14 LAB — BASIC METABOLIC PANEL
Anion gap: 6 (ref 5–15)
BUN: 19 mg/dL (ref 8–23)
CO2: 26 mmol/L (ref 22–32)
Calcium: 9.3 mg/dL (ref 8.9–10.3)
Chloride: 105 mmol/L (ref 98–111)
Creatinine, Ser: 1.38 mg/dL — ABNORMAL HIGH (ref 0.44–1.00)
GFR, Estimated: 38 mL/min — ABNORMAL LOW (ref >60)
Glucose, Bld: 86 mg/dL (ref 70–99)
Potassium: 4.2 mmol/L (ref 3.5–5.1)
Sodium: 137 mmol/L (ref 135–145)

## 2021-12-14 LAB — GLUCOSE, CAPILLARY: Glucose-Capillary: 81 mg/dL (ref 70–99)

## 2021-12-14 MED ORDER — METOPROLOL SUCCINATE ER 25 MG PO TB24
12.5000 mg | ORAL_TABLET | Freq: Every day | ORAL | Status: DC
  Administered 2021-12-14: 12.5 mg via ORAL
  Filled 2021-12-14: qty 1

## 2021-12-14 NOTE — Progress Notes (Signed)
   Rebecca Gilmore , MD [address] [place], [place], [place], [address] [address], [place], [place], [place], [address] Phone: [phone]  Fax: [phone]   Rebecca Gilmore is being followed for melena   Subjective: No complaints , has brown stool    Objective: Vital signs in last 24 hours: Vitals:   12/13/21 1548 12/13/21 2255 12/14/21 0309 12/14/21 0741  BP: 112/67 (!) 130/55 (!) 102/50 (!) 119/49  Pulse: 66 69 66 64  Resp: _0 (!) 22  Temp: 98.5 F (36.9 C) 98.8 F (37.1 C) 98.2 F (36.8 C) 98.4 F (36.9 C)  TempSrc:  Oral Oral   SpO2: 100% 96% 95% 98%  Weight:      Height:       Weight change: 0 kg  Intake/Output Summary (Last 24 hours) at 12/14/2021 1352 Last data filed at 12/14/2021 1300 Gross per 24 hour  Intake 1740 ml  Output 500 ml  Net 1240 ml     Exam: Heart:: Regular rate and rhythm, S1S2 present, or without murmur or extra heart sounds Lungs: normal and clear to auscultation Abdomen: soft, nontender, normal bowel sounds   Lab Results: _1 @ Micro Results: No results found for this or any previous visit (from the past 240 hour(s)). Studies/Results: No results found. Medications: I have reviewed the patient's current medications. Scheduled Meds:  amitriptyline  25 mg Oral QHS   cholecalciferol  1,000 Units Oral Daily   cyanocobalamin  1,000 mcg Oral Daily   doxycycline  100 mg Oral BID   ferrous sulfate  325 mg Oral Daily   metoprolol succinate  12.5 mg Oral Daily   pantoprazole (PROTONIX) IV  40 mg Intravenous Q12H   predniSONE  2 mg Oral Daily   pregabalin  75 mg Oral Daily   rosuvastatin  5 mg Oral Daily   sertraline  25 mg Oral Daily   Continuous Infusions: PRN Meds:.acetaminophen, hydrALAZINE, loratadine, ondansetron (ZOFRAN) IV, polyethylene glycol-electrolytes   Assessment: Principal Problem:   GI bleeding Active Problems:   Femoral artery occlusion, left (HCC)   Depression   HLD (hyperlipidemia)   Iron  deficiency anemia   Chronic diastolic CHF (congestive heart failure) (HCC)   Acute renal failure superimposed on stage 3b chronic kidney disease (HCC)   Myocardial injury   Atrial fibrillation, chronic (HCC)   Temporal arteritis (HCC)   Acute blood loss anemia   Anemia   Rebecca Gilmore 81 y.o. female admitted with dark stools , was on eloquis. EGD- no significant abnormality , colonoscopy too was normal . Plan was for OP capsule study but patient requests in patient . Hb stable.   Plan: Capsule study of the small bowel tomorrow   Risks, benefits, alternatives of Givens capsule discussed with patient to include but not limited to the rare risk of Given's capsule becoming lodged in the GI tract requiring surgical removal.  The patient agrees with this plan & consent will be obtained.    LOS: 3 days   Rebecca Bellows, MD 12/14/2021, 1:52 PM

## 2021-12-14 NOTE — Progress Notes (Signed)
PROGRESS NOTE    Rebecca Gilmore   ERD:[id number] DOB: [birth year]  DOA: 12/11/2021 Date of Service: 12/14/21 PCP: Philmore Pali, NP (Inactive)     Brief Narrative / Hospital Course:  Rebecca Gilmore is a 81 y.o. 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  She  presents to ED from home on 12/11/2021 with dark stool (intermittent x9-10 mos), chest pain x few hrs PTA, and leg edema. 10/05: Initially hypotensive with blood pressure 50s/40s, which improved to 117/39 after given 1 L of LR. Hemoglobin dropped from 8.9 on 11/24/2021 to 5.7 --> 4.9 today.  WBC 7.2, troponin level 42, 37, BNP 262, positive FOBT, temperature normal, heart rate 70, RR 18, oxygen saturation 97% - 100% on room air currently.  Creatinine 1.8.  Chest x-ray negative. Pt is admitted to telemetry bed as inpatient.  Dr. Virgina Jock of GI and Dr. Saralyn Pilar of cardiology consulted.  2 units PRBC.  Per GI: EGD tomorrow pending stability.  If hemodynamically unstable, recommend stat CTA for potential IR/vascular intervention.  Per cardiology: Elevated troponin and other cardiac issues likely related to significant anemia and hypotension/demand ischemia. 10/06: BP stable.  Hgb stable, 7.3 posttransfusion and 8.2 this morning. Creatinine improved at 1.5. EGD today (+)gastritis. Colonoscopy planned tomorrow.  10/07: H/H stable, Hgb 8.6 this morning. Colonoscopy  fair prep may miss small AVM but no overt bleeding or dark stool was noted. Plan stay thru Monday for capsule endoscopy (today is Saturday) on CLD.  10/08: Spoke on phone w/ Dr Virgina Jock - given [place] stable and brown stool on colonoscopy, unlikely to have small intestinal bleed, would  recommend continue holding Eliquis and keep on iron supplements and PPI, option to defer capsule. D/w patient, she would like to stay for the capsule endoscopy.   Consultants:  Gastroenterology Cardiology  Procedures: EGD 12/12/21  --> hiatal hernia, gastritis Colonoscopy 12/13/2021 --> fair prep may miss small AVM but no overt bleeding or dark stool was noted. Consider outpatient capsule endoscopy       ASSESSMENT & PLAN:   Principal Problem:   GI bleeding Active Problems:   Acute blood loss anemia   Iron deficiency anemia   Chronic diastolic CHF (congestive heart failure) (HCC)   Acute renal failure superimposed on stage 3b chronic kidney disease (HCC)   Atrial fibrillation, chronic (HCC)   Femoral artery occlusion, left (HCC)   HLD (hyperlipidemia)   Myocardial injury   Temporal arteritis (HCC)   Depression   GI bleeding GI bleeding, acute blood loss anemia and history of iron deficiency anemia:  Acute blood loss anemia Hx iron deficiency anemia Hemoglobin dropped from 8.9 --> 5.7 --> 4.9.  Initially hypotensive which responded to IV fluid resuscitation.  Hgb stable over 8  today  Lab: INR, PTT and type screen, anemia panel - no concerns Status post 2 units PRBC, Hgb 8.2 this morning, continue to monitor CBC EGD 2 days ago --> hiatal hernia, gastritic Colonoscopy yesterday --> fair prep may miss small AVM but no overt bleeding or dark stool was noted.  Plan capsule endoscopy --> pt refusing this unless we can do it here but we can't arrange this until Monday. D/w Dr Vicente Males yesterday, can keep her here thru the weekend on CLD  Continue pantoprazole 40 mg bid Zofran IV for nausea Avoid NSAIDs and Rx DVT PPx Maintain IV access (2 large bore IVs if possible). Transfuse as necessary, if Hgb<7.0 Continue iron supplement hold Eliquis  Chronic diastolic CHF (congestive heart failure) ([place]) 2D echo on 11/17/2021 showed EF of 65-70% with grade 2 diastolic dysfunction.  Patient  has elevated BNP 262, bilateral leg edema, but no shortness of breath, indicating mild fluid overload.   Due to GI bleed and hypotension, will not start diuretics. Hold Lasix due to hypotension and worsening renal function  Acute renal failure superimposed on stage 3b chronic kidney disease ([place]) Baseline creatinine 1.30 on 11/24/2021.  Her creatinine on admission was 1.8, BUN 41, GFR 28. Likely due to volume depletion and continuation of diuretics Creatinine improved yesterday to 1.5 Hold Lasix  Atrial fibrillation, chronic (HCC) Heart rate controlled, sinus. Continue metoprolol with holding parameters for blood pressure  Hold Eliquis  Femoral artery occlusion, left (HCC) Ongoing healing wound in left groin S/P Left femoral endarterectomy with thrombectomy due to critical limb ischemia. Patient still has ongoing healing wound in the left groin area.  No fever or leukocytosis. Continue home doxycycline  HLD (hyperlipidemia) Crestor  Myocardial injury Myocardial injury due to severe anemia.  Consulted Dr. Saralyn Pilar of cardiology. Troponin level 42, 37. Held aspirin due to GI bleeding Continue Crestor Per cardio - no further diagnostics at this time continue current tx and hold apixiban   Temporal arteritis (HCC) Continue low-dose prednisone 2 mg daily  Depression Continue home medications    DVT prophylaxis: SCD, holding pharmacologic prophylaxis due to GI bleed Pertinent IV fluids/nutrition: no continuous IV fluids at this time. Clear liquids until capsule endoscopy  Central lines / invasive devices: None  Code Status: FULL CODE Family Communication: none at this time  Disposition: inpatient TOC needs: none at this time, may need PT/OT eval for HH/SNF when more stable Barriers to discharge / significant pending items: pending capsule endoscopy Monday (today is Saturday)              Subjective:  Patient denies CP/SOB, abd pain. NO bleeding. No  dizzy/lightheaded.        Objective:  Vitals:   12/13/21 1548 12/13/21 2255 12/14/21 0309 12/14/21 0741  BP: 112/67 (!) 130/55 (!) 102/50 (!) 119/49  Pulse: 66 69 66 64  Resp: _0 (!) 22  Temp: 98.5 F (36.9 C) 98.8 F (37.1 C) 98.2 F (36.8 C) 98.4 F (36.9 C)  TempSrc:  Oral Oral   SpO2: 100% 96% 95% 98%  Weight:      Height:        Intake/Output Summary (Last 24 hours) at 12/14/2021 1534 Last data filed at 12/14/2021 1412 Gross per 24 hour  Intake 2520 ml  Output 500 ml  Net 2020 ml   Filed Weights   12/11/21 2113 12/13/21 0603 12/13/21 0900  Weight: 108.4 kg 103.4 kg 103.4 kg    Examination:  Constitutional:  VS as above General Appearance: alert, slightly pale, NAD Respiratory: Normal respiratory effort No wheeze No rhonchi No rales Cardiovascular: S1/S2 normal No murmur No rub/gallop auscultated No lower extremity edema Gastrointestinal: No tenderness Musculoskeletal:  No clubbing/cyanosis of digits Symmetrical movement in all extremities Neurological: No cranial nerve deficit on limited exam Alert Psychiatric: Normal judgment/insight Normal mood and affect       Scheduled Medications:   amitriptyline  25 mg Oral QHS   cholecalciferol  1,000 Units Oral Daily   cyanocobalamin  1,000  mcg Oral Daily   doxycycline  100 mg Oral BID   ferrous sulfate  325 mg Oral Daily   metoprolol succinate  12.5 mg Oral Daily   pantoprazole (PROTONIX) IV  40 mg Intravenous Q12H   predniSONE  2 mg Oral Daily   pregabalin  75 mg Oral Daily   rosuvastatin  5 mg Oral Daily   sertraline  25 mg Oral Daily    Continuous Infusions:    PRN Medications:  acetaminophen, hydrALAZINE, loratadine, ondansetron (ZOFRAN) IV, polyethylene glycol-electrolytes  Antimicrobials:  Anti-infectives (From admission, onward)    Start     Dose/Rate Route Frequency Ordered Stop   12/11/21 1330  doxycycline (VIBRA-TABS) tablet 100 mg        100 mg Oral 2 times  daily 12/11/21 1322         Data Reviewed: I have personally reviewed following labs and imaging studies  CBC: Recent Labs  Lab 12/11/21 1634 12/11/21 2152 12/12/21 0303 12/12/21 0833 12/12/21 1841 12/13/21 0623 12/13/21 1650 12/14/21 0702  WBC 7.1  --  7.1 6.2  --  6.0  --  5.0  HGB 7.3*   < > 8.2* 8.5* 10.3* 8.6* 8.8* 8.9*  HCT 23.5*   < > 25.4* 25.6* 32.4* 27.2* 28.2* 28.5*  MCV 93.3  --  91.7 91.4  --  93.8  --  96.3  PLT 172  --  169 155  --  175  --  173   < > = values in this interval not displayed.   Basic Metabolic Panel: Recent Labs  Lab 12/11/21 0730 12/12/21 0303 12/13/21 0623 12/14/21 0702  NA 138 142 142 137  K 4.2 4.0 4.0 4.2  CL 102 109 109 105  CO2 _0 GLUCOSE 97 81 89 86  BUN 41* 34* 25* 19  CREATININE 1.80* 1.51* 1.43* 1.38*  CALCIUM 9.2 9.0 9.0 9.3   GFR: Estimated Creatinine Clearance: 37.5 mL/min (A) (by C-G formula based on SCr of 1.38 mg/dL (H)). Liver Function Tests: No results for input(s): "AST", "ALT", "ALKPHOS", "BILITOT", "PROT", "ALBUMIN" in the last 168 hours. No results for input(s): "LIPASE", "AMYLASE" in the last 168 hours. No results for input(s): "AMMONIA" in the last 168 hours. Coagulation Profile: Recent Labs  Lab 12/11/21 0932 12/12/21 0303  INR 1.8* 1.4*   Cardiac Enzymes: No results for input(s): "CKTOTAL", "CKMB", "CKMBINDEX", "TROPONINI" in the last 168 hours. BNP (last 3 results) No results for input(s): "PROBNP" in the last 8760 hours. HbA1C: No results for input(s): "HGBA1C" in the last 72 hours.  CBG: Recent Labs  Lab 12/12/21 0834 12/13/21 0759 12/13/21 1202 12/14/21 0742  GLUCAP 82 83 70 81   Lipid Profile: Recent Labs    12/12/21 0303  CHOL 111  HDL 41  LDLCALC 52  TRIG 88  CHOLHDL 2.7   Thyroid Function Tests: No results for input(s): "TSH", "T4TOTAL", "FREET4", "T3FREE", "THYROIDAB" in the last 72 hours. Anemia Panel: Recent Labs    12/11/21 1634  VITAMINB12 910    Urine analysis: N/a Sepsis Labs: _1 (procalcitonin:4,lacticidven:4)  No results found for this or any previous visit (from the past 240 hour(s)).       Radiology Studies: DG Chest 2 View  Result Date: 12/11/2021 CLINICAL DATA:  Chest pain and shortness of breath. EXAM: CHEST - 2 VIEW COMPARISON:  11/19/2021 FINDINGS: Asymmetric elevation right hemidiaphragm The cardio pericardial silhouette is enlarged. The lungs are clear without focal pneumonia, edema, pneumothorax or pleural effusion. The visualized  bony structures of the thorax are unremarkable. IMPRESSION: No active cardiopulmonary disease. Electronically Signed   By: Misty Stanley M.D.   On: 12/11/2021 07:53            LOS: 3 days    Emeterio Reeve, DO [facility] 12/14/2021, 3:34 PM   Staff may message me via secure chat in [place]  but this may not receive immediate response,  please page for urgent matters!  If 7PM-7AM, please contact night-coverage [email or web address]  Dictation software was used to generate the above note. Typos may occur and escape review, as with typed/written notes. Please contact Dr Sheppard Coil directly for clarity if needed.

## 2021-12-14 NOTE — TOC Initial Note (Signed)
Transition of Care [facility]) - Initial/Assessment Note    Patient Details  Name: Rebecca Gilmore MRN: [id number] Date of Birth: [birth year]  Transition of [facility]) CM/SW Contact:    Magnus Ivan, LCSW Phone Number: 12/14/2021, 2:10 PM  Clinical Narrative:                 CSW spoke with patient who requested CSW call her daughter Rebecca Gilmore to discuss DC planning. Called [place]. Rebecca Gilmore stated patient lives with her spouse, grandson lives next door and is supportive. Rebecca Gilmore usually drives patient to appointments, if she is unavailable then other family will take patient.  PCP is [facility] in [place]. Pharmacy [facility].  Patient has a RW, rollator, and 3in1 at home.  Patient was recently set up with [facility], Rebecca Gilmore stated they never heard anything from them and want a different agency. CSW notified Therapist, art. Confirmed home address and per Rebecca Gilmore is in [facility]. HH referral accepted by [facility] with Alvis Lemmings. [place] # provided to [employer].   Expected Discharge Plan: [place] Barriers to Discharge: Continued Medical Work up   Patient Goals and CMS Choice Patient states their goals for this hospitalization and ongoing recovery are:: home with home health CMS Medicare.gov Compare Post Acute Care list provided to:: Patient Represenative (must comment) Choice offered to / list presented to : Patient, Adult Children  Expected Discharge Plan and Services Expected Discharge Plan: [place]       Living arrangements for the past 2 months: Single Family Home                                      Prior Living Arrangements/Services Living arrangements for the past 2 months: Single Family Home Lives with:: Spouse Patient language and need for interpreter reviewed:: Yes Do you feel safe going back to the place where you live?: Yes      Need for Family Participation in Patient Care: Yes  (Comment) Care giver support system in place?: Yes (comment) Current home services: DME Criminal Activity/Legal Involvement Pertinent to Current Situation/Hospitalization: No - Comment as needed  Activities of Daily Living Home Assistive Devices/Equipment: Cane (specify quad or straight) ADL Screening (condition at time of admission) Patient's cognitive ability adequate to safely complete daily activities?: Yes Is the patient deaf or have difficulty hearing?: No Does the patient have difficulty seeing, even when wearing glasses/contacts?: No Does the patient have difficulty concentrating, remembering, or making decisions?: No Patient able to express need for assistance with ADLs?: Yes Does the patient have difficulty dressing or bathing?: No Independently performs ADLs?: Yes (appropriate for developmental age) Does the patient have difficulty walking or climbing stairs?: Yes Weakness of Legs: Left Weakness of Arms/Hands: None  Permission Sought/Granted Permission sought to share information with : Facility Sport and exercise psychologist, Family Supports Permission granted to share information with : Yes, Verbal Permission Granted     Permission granted to share info w AGENCY: [place] agencies  Permission granted to share info w Relationship: daughter Rebecca Gilmore     Emotional Assessment       Orientation: : Oriented to Self, Oriented to Place, Oriented to  Time, Oriented to Situation Alcohol / Substance Use: Not Applicable Psych Involvement: No (comment)  Admission diagnosis:  GI bleeding [K92.2] Generalized weakness [R53.1] Anemia, unspecified type [D64.9] Chest pain, unspecified type [R07.9] Patient Active Problem List  Diagnosis Date Noted   Anemia    GI bleeding 12/11/2021   Temporal arteritis ([place]) 12/11/2021   Acute blood loss anemia 12/11/2021   Depression    HLD (hyperlipidemia)    Iron deficiency anemia    Chronic diastolic CHF (congestive heart failure) (HCC)    Acute  renal failure superimposed on stage 3b chronic kidney disease (HCC)    Myocardial injury    Atrial fibrillation, chronic (HCC)    Femoral artery occlusion, left ([place]) 11/16/2021   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 [place] [address]Phone: [phone]: [phone]    Social Determinants of Health (SDOH) Interventions    Readmission Risk Interventions     No data to display

## 2021-12-14 NOTE — Evaluation (Signed)
Physical Therapy Evaluation Patient Details Name: Rebecca Gilmore MRN: [id number] DOB: 04-18-1940 Today's Date: 12/14/2021  History of Present Illness  Pt is an 40yoF with a PMH of CAD s/p NSTEMI 02/2021 (diffuse RCA, 80% ostial RCA, 50-60% proximal to mid LAD by [facility]), atrial fibrillation versus flutter in the setting of sepsis and NSTEMI 02/2021 -not on anticoagulation, moderate aortic stenosis, chronic HFpEF (EF 65-70%, G2 DD 11/2021) PAD s/p left iliofemoral endarterectomy and patch angioplasty 11/16/2021, CKD 3, hyperlipidemia, temporal arteritis on chronic prednisone, 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   Clinical Impression  Patient alert agreeable to PT with some encouragement. Did report at baseline she is ambulatory with her walking stick, performs her ADLs, modI, and does the IADLs for her husband and herself. Stated her grandson lives right behind her and can assist somewhat.  The patient was able to perform bed mobility modI. Stand pivot to [facility] with BUE support, able to void and perform pericare modI. She also progressed to stepping a few feet in the room to the recliner, initially minA for steadying but with at least unilateral UE pt able to complete CGA.  Overall the patient demonstrated deficits (see "PT Problem List") that impede the patient's functional abilities, safety, and mobility and would benefit from skilled PT intervention. Recommendation at this time is HHPT with frequent/constant supervision/assistance to maximize safety, mobility, and independence.        Recommendations for follow up therapy are one component of a multi-disciplinary discharge planning process, led by the attending physician.  Recommendations may be updated based on patient status, additional functional criteria and  insurance authorization.  Follow Up Recommendations Home health PT Can patient physically be transported by private vehicle: Yes    Assistance Recommended at Discharge Frequent or constant Supervision/Assistance  Patient can return home with the following  A little help with walking and/or transfers;Assistance with cooking/housework;Assist for transportation;Help with stairs or ramp for entrance;A little help with bathing/dressing/bathroom    Equipment Recommendations None recommended by PT  Recommendations for Other Services       Functional Status Assessment Patient has had a recent decline in their functional status and demonstrates the ability to make significant improvements in function in a reasonable and predictable amount of time.     Precautions / Restrictions Precautions Precautions: Fall Restrictions Weight Bearing Restrictions: No      Mobility  Bed Mobility Overal bed mobility: Modified Independent Bed Mobility: Supine to Sit     Supine to sit: HOB elevated     General bed mobility comments: use of bed rails    Transfers Overall transfer level: Needs assistance Equipment used: None, 1 person hand held assist Transfers: Sit to/from Stand, Bed to chair/wheelchair/BSC Sit to Stand: Min guard           General transfer comment: stand pivot to [facility] with supervision, pt reliant on BUE support, but no physical assistance needed    Ambulation/Gait Ambulation/Gait assistance: Min assist Gait Distance (Feet): 3 Feet Assistive device: 1 person hand held assist         General Gait Details: pt declined RW; able to take several steps in room towards recliner, very light minA for steadying when standing but able to complete  Stairs            Wheelchair Mobility    Modified  Rankin (Stroke Patients Only)       Balance Overall balance assessment: Needs assistance Sitting-balance support: Bilateral upper extremity supported, Feet  supported Sitting balance-Leahy Scale: Fair     Standing balance support: Bilateral upper extremity supported, During functional activity, Reliant on assistive device for balance Standing balance-Leahy Scale: Fair                               Pertinent Vitals/Pain Pain Assessment Pain Assessment: No/denies pain    Home Living Family/patient expects to be discharged to:: Private residence Living Arrangements: Spouse/significant other Available Help at Discharge: Family;Available 24 hours/day;Available PRN/intermittently (husband, grandson) Type of Home: House Home Access: Stairs to enter Entrance Stairs-Rails:  (bar) Entrance Stairs-Number of Steps: 2   Home Layout: One level Home Equipment: Conservation officer, nature (2 wheels);Rollator (4 wheels);BSC/3in1 (equipment in attic)      Prior Function Prior Level of Function : Independent/Modified Independent             Mobility Comments: uses walking stick ADLs Comments: completes IADLs for husband except walking the dog     Hand Dominance        Extremity/Trunk Assessment   Upper Extremity Assessment Upper Extremity Assessment: Generalized weakness    Lower Extremity Assessment Lower Extremity Assessment: Generalized weakness       Communication   Communication: No difficulties  Cognition Arousal/Alertness: Awake/alert Behavior During Therapy: WFL for tasks assessed/performed Overall Cognitive Status: Within Functional Limits for tasks assessed                                          General Comments      Exercises     Assessment/Plan    PT Assessment Patient needs continued PT services  PT Problem List Decreased strength;Decreased activity tolerance;Decreased balance;Decreased mobility;Cardiopulmonary status limiting activity       PT Treatment Interventions DME instruction;Therapeutic activities;Gait training;Therapeutic exercise;Patient/family education;Stair  training;Balance training;Functional mobility training;Neuromuscular re-education    PT Goals (Current goals can be found in the Care Plan section)  Acute Rehab PT Goals Patient Stated Goal: get better and return home Time For Goal Achievement: 12/28/21 Potential to Achieve Goals: Good    Frequency Min 2X/week     Co-evaluation               AM-PAC PT "6 Clicks" Mobility  Outcome Measure Help needed turning from your back to your side while in a flat bed without using bedrails?: A Little Help needed moving from lying on your back to sitting on the side of a flat bed without using bedrails?: A Little Help needed moving to and from a bed to a chair (including a wheelchair)?: A Little Help needed standing up from a chair using your arms (e.g., wheelchair or bedside chair)?: A Little Help needed to walk in hospital room?: A Little Help needed climbing 3-5 steps with a railing? : A Lot 6 Click Score: 17    End of Session Equipment Utilized During Treatment: Gait belt Activity Tolerance: Patient tolerated treatment well Patient left: in chair;with call bell/phone within reach;with chair alarm set Nurse Communication: Mobility status PT Visit Diagnosis: Unsteadiness on feet (R26.81);Muscle weakness (generalized) (M62.81)    Time: 6644-0347 PT Time Calculation (min) (ACUTE ONLY): 22 min   Charges:   PT Evaluation $PT Eval Low Complexity: 1 Low PT Treatments $  Therapeutic Activity: 8-22 mins        Lieutenant Diego PT, DPT 12:33 PM,12/14/21

## 2021-12-15 ENCOUNTER — Inpatient Hospital Stay: Payer: Medicare Other | Admitting: Certified Registered"

## 2021-12-15 ENCOUNTER — Encounter
Admission: EM | Disposition: A | Discharge status: Home Health | Payer: Self-pay | Source: Home / Self Care | Attending: Osteopathic Medicine

## 2021-12-15 ENCOUNTER — Encounter (INDEPENDENT_AMBULATORY_CARE_PROVIDER_SITE_OTHER): Payer: Self-pay | Admitting: Nurse Practitioner

## 2021-12-15 DIAGNOSIS — K922 Gastrointestinal hemorrhage, unspecified: Secondary | ICD-10-CM | POA: Diagnosis not present

## 2021-12-15 DIAGNOSIS — D500 Iron deficiency anemia secondary to blood loss (chronic): Secondary | ICD-10-CM | POA: Diagnosis not present

## 2021-12-15 HISTORY — PX: GIVENS CAPSULE STUDY: SHX5432

## 2021-12-15 LAB — BASIC METABOLIC PANEL
Anion gap: 5 (ref 5–15)
BUN: 18 mg/dL (ref 8–23)
CO2: 26 mmol/L (ref 22–32)
Calcium: 9 mg/dL (ref 8.9–10.3)
Chloride: 110 mmol/L (ref 98–111)
Creatinine, Ser: 1.39 mg/dL — ABNORMAL HIGH (ref 0.44–1.00)
GFR, Estimated: 38 mL/min — ABNORMAL LOW (ref >60)
Glucose, Bld: 92 mg/dL (ref 70–99)
Potassium: 4.1 mmol/L (ref 3.5–5.1)
Sodium: 141 mmol/L (ref 135–145)

## 2021-12-15 LAB — SURGICAL PATHOLOGY

## 2021-12-15 LAB — CBC
HCT: 28.7 % — ABNORMAL LOW (ref 36.0–46.0)
Hemoglobin: 9.1 g/dL — ABNORMAL LOW (ref 12.0–15.0)
MCH: 30.5 pg (ref 26.0–34.0)
MCHC: 31.7 g/dL (ref 30.0–36.0)
MCV: 96.3 fL (ref 80.0–100.0)
Platelets: 166 10*3/uL (ref 150–400)
RBC: 2.98 MIL/uL — ABNORMAL LOW (ref 3.87–5.11)
RDW: 17.8 % — ABNORMAL HIGH (ref 11.5–15.5)
WBC: 4.6 10*3/uL (ref 4.0–10.5)
nRBC: 0 % (ref 0.0–0.2)

## 2021-12-15 LAB — GLUCOSE, CAPILLARY: Glucose-Capillary: 84 mg/dL (ref 70–99)

## 2021-12-15 SURGERY — IMAGING PROCEDURE, GI TRACT, INTRALUMINAL, VIA CAPSULE

## 2021-12-15 MED ORDER — ACETAMINOPHEN 325 MG PO TABS: 650.0000 mg | ORAL_TABLET | Freq: Four times a day (QID) | ORAL | Status: AC | PRN

## 2021-12-15 NOTE — Progress Notes (Signed)
Patient has been NPO since 0000 for capsule study.

## 2021-12-15 NOTE — Evaluation (Addendum)
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   Clinical Impression   Patient presenting with decreased endurance, strength, and balance impacting safety and independence in ADLs. At baseline, pt was independent with most ADLs (husband assists with LB dressing) and IADLs (family assist for transportation). Pt completed functional mobility using a walking stick. She lives with her husband and has a grandson next door who can provide physical assistance as needed upon discharge. Patient currently functioning at Mod I for supine <> sit, set up-supervision for seated grooming tasks, and Max A for LB dressing. Patient will benefit from acute OT to increase overall independence in the areas of ADLs and functional mobility in order to safely discharge home. Pt could benefit from [facility] following D/C to decrease falls risk, improve balance, and maximize independence in self-care within own home environment.     Recommendations for follow up therapy are one component of a multi-disciplinary discharge planning process, led by the attending physician.  Recommendations may be updated based on patient status, additional functional criteria and insurance  authorization.   Follow Up Recommendations  Home health OT    Assistance Recommended at Discharge Frequent or constant Supervision/Assistance  Patient can return home with the following A little help with walking and/or transfers;A lot of help with bathing/dressing/bathroom;Assistance with cooking/housework;Help with stairs or ramp for entrance;Assist for transportation    Functional Status Assessment  Patient has had a recent decline in their functional status and demonstrates the ability to make significant improvements in function in a reasonable and predictable amount of time.  Equipment Recommendations  None recommended by OT    Recommendations for Other Services       Precautions / Restrictions Precautions Precautions: Fall Restrictions Weight Bearing Restrictions: No      Mobility Bed Mobility Overal bed mobility: Modified Independent Bed Mobility: Supine to Sit     Supine to sit: HOB elevated, Modified independent (Device/Increase time) Sit to supine: Modified independent (Device/Increase time), HOB elevated   General bed mobility comments: use of bed rails    Transfers                   General transfer comment: pt deferred      Balance Overall balance assessment: Needs assistance Sitting-balance support: Bilateral upper extremity supported, Feet supported Sitting balance-Leahy Scale: Fair         Standing balance comment: pt deferred                           ADL either performed or assessed with clinical judgement   ADL Overall ADL's : Needs assistance/impaired     Grooming: Set up;Supervision/safety;Sitting;Wash/dry face;Brushing hair               Lower Body Dressing: Maximal assistance;Sitting/lateral leans Lower Body Dressing  Details (indicate cue type and reason): to don socks; reports husband helps at baseline               General ADL Comments: Pt deferred standing at sink to complete grooming tasks 2/2  urinary incontinence in standing. Pt reported just getting up to use BSC with stand pivot transfer.     Vision Baseline Vision/History: 1 Wears glasses (readers only) Patient Visual Report: No change from baseline       Perception     Praxis      Pertinent Vitals/Pain Pain Assessment Pain Assessment: No/denies pain     Hand Dominance     Extremity/Trunk Assessment Upper Extremity Assessment Upper Extremity Assessment: Generalized weakness   Lower Extremity Assessment Lower Extremity Assessment: Generalized weakness       Communication Communication Communication: No difficulties   Cognition Arousal/Alertness: Awake/alert Behavior During Therapy: WFL for tasks assessed/performed Overall Cognitive Status: Within Functional Limits for tasks assessed                                       General Comments       Exercises Other Exercises Other Exercises: OT provided education re: role of OT, OT POC, post acute recs, sitting up for all meals, EOB/OOB mobility with assistance, home/fall safety.     Shoulder Instructions      Home Living Family/patient expects to be discharged to:: Private residence Living Arrangements: Spouse/significant other Available Help at Discharge: Family;Available 24 hours/day;Available PRN/intermittently (husband, grandson) Type of Home: House Home Access: Stairs to enter [employer] of Steps: 2 Entrance Stairs-Rails:  (bar) Home Layout: One level     Bathroom Shower/Tub: Walk-in shower         Home Equipment: Conservation officer, nature (2 wheels);Rollator (4 wheels);BSC/3in1;Shower seat - built in;Grab bars - tub/shower;Toilet riser (equipment in attic)   Additional Comments: Live with spouse, grandson next door      Prior Functioning/Environment Prior Level of Function : Independent/Modified Independent             Mobility Comments: uses walking stick, denies falls ADLs Comments: IND most ADLs (husband  assist with LB dressing), completes IADLs for husband except walking the dog. Daughter or other family members provide transportation to appointments. Urinary incontinence at baseline        OT Problem List: Decreased strength;Decreased range of motion;Decreased activity tolerance;Impaired balance (sitting and/or standing);Decreased safety awareness      OT Treatment/Interventions: Self-care/ADL training;Therapeutic exercise;Energy conservation;DME and/or AE instruction;Therapeutic activities;Patient/family education;Balance training    OT Goals(Current goals can be found in the care plan section) Acute Rehab OT Goals Patient Stated Goal: go home OT Goal Formulation: With patient Time For Goal Achievement: 12/29/21 Potential to Achieve Goals: Good ADL Goals Pt Will Perform Grooming: with modified independence;standing Pt Will Perform Lower Body Dressing: with min assist;sit to/from stand;with caregiver independent in assisting Pt Will Transfer to Toilet: with modified independence;ambulating;regular height toilet Pt Will Perform Toileting - Clothing Manipulation and hygiene: with modified independence;sit to/from stand  OT Frequency: Min 2X/week    Co-evaluation              AM-PAC OT "6 Clicks" Daily Activity     Outcome Measure Help from another person eating meals?: None Help from another person taking care of personal grooming?: A Little Help from another person toileting, which includes using toliet, bedpan, or urinal?: A Little Help from another  person bathing (including washing, rinsing, drying)?: A Lot Help from another person to put on and taking off regular upper body clothing?: A Little Help from another person to put on and taking off regular lower body clothing?: A Lot 6 Click Score: 17   End of Session Nurse Communication: Mobility status  Activity Tolerance: Patient tolerated treatment well Patient left: in bed;with bed alarm set;with call bell/phone within  reach  OT Visit Diagnosis: Muscle weakness (generalized) (M62.81);Unsteadiness on feet (R26.81)                Time: 9977-4142 OT Time Calculation (min): 21 min Charges:  OT General Charges $OT Visit: 1 Visit OT Evaluation $OT Eval Low Complexity: 1 Low  [facility] MS, OTR/L ascom [phone]  12/15/21, 11:56 AM

## 2021-12-15 NOTE — TOC Transition Note (Signed)
Transition of Care [facility]) - CM/SW Discharge Note   Patient Details  Name: Rebecca Gilmore MRN: [id number] Date of Birth: June 13, 1940  [facility]) CM/SW Contact:  Beverly Sessions, RN Phone Number: 12/15/2021, 4:20 PM   Clinical Narrative:     Patient to discharge today Rebecca Gilmore with [facility] aware.  MD to enter home health orders for PT and OT    Barriers to Discharge: Continued Medical Work up   Patient Goals and CMS Choice Patient states their goals for this hospitalization and ongoing recovery are:: home with home health CMS Medicare.gov Compare Post Acute Care list provided to:: Patient Represenative (must comment) Choice offered to / list presented to : Patient, Adult Children  Discharge Placement                       Discharge Plan and Services                                     Social Determinants of Health (SDOH) Interventions     Readmission Risk Interventions     No data to display

## 2021-12-15 NOTE — Progress Notes (Signed)
Inpatient Follow-up/Progress Note   Patient ID: Rebecca Gilmore is a 81 y.o. female.  Overnight Events / Subjective Findings NAEON. Undergiong VCE. No GI complaints. No melena/hematochezia. Hgb improving. No other acute gi complaints.  Review of Systems  Constitutional:  Negative for activity change, appetite change, chills, diaphoresis, fatigue, fever and unexpected weight change.  HENT:  Negative for trouble swallowing and voice change.   Respiratory:  Negative for shortness of breath and wheezing.   Cardiovascular:  Negative for chest pain, palpitations and leg swelling.  Gastrointestinal:  Negative for abdominal distention, abdominal pain, anal bleeding, blood in stool, constipation, diarrhea, nausea, rectal pain and vomiting.  Musculoskeletal:  Negative for arthralgias and myalgias.  Skin:  Negative for color change and pallor.  Neurological:  Negative for dizziness, syncope and weakness.  Psychiatric/Behavioral:  Negative for confusion.   All other systems reviewed and are negative.    Medications  Current Facility-Administered Medications:    acetaminophen (TYLENOL) tablet 650 mg, 650 mg, Oral, Q6H PRN, Ivor Costa, MD   amitriptyline (ELAVIL) tablet 25 mg, 25 mg, Oral, QHS, Ivor Costa, MD, 25 mg at 12/14/21 2111   cholecalciferol (VITAMIN D3) 25 MCG (1000 UNIT) tablet 1,000 Units, 1,000 Units, Oral, Daily, Ivor Costa, MD, 1,000 Units at 12/14/21 4010   cyanocobalamin (VITAMIN B12) tablet 1,000 mcg, 1,000 mcg, Oral, Daily, Ivor Costa, MD, 1,000 mcg at 12/14/21 2725   doxycycline (VIBRA-TABS) tablet 100 mg, 100 mg, Oral, BID, Ivor Costa, MD, 100 mg at 12/14/21 2111   ferrous sulfate tablet 325 mg, 325 mg, Oral, Daily, Ivor Costa, MD, 325 mg at 12/14/21 1537   hydrALAZINE (APRESOLINE) injection 5 mg, 5 mg, Intravenous, Q2H PRN, Ivor Costa, MD   loratadine (CLARITIN) tablet 10 mg, 10 mg, Oral, Daily PRN, Ivor Costa, MD   metoprolol succinate (TOPROL-XL) 24 hr tablet 12.5 mg, 12.5  mg, Oral, Daily, Emeterio Reeve, DO, 12.5 mg at 12/14/21 0928   ondansetron (ZOFRAN) injection 4 mg, 4 mg, Intravenous, Q8H PRN, Ivor Costa, MD   pantoprazole (PROTONIX) injection 40 mg, 40 mg, Intravenous, Q12H, Ivor Costa, MD, 40 mg at 12/14/21 2111   polyethylene glycol-electrolytes (NuLYTELY) solution 2,000 mL, 2,000 mL, Oral, Once PRN, [name], DO   predniSONE (DELTASONE) tablet 2 mg, 2 mg, Oral, Daily, Ivor Costa, MD, 2 mg at 12/14/21 3664   pregabalin (LYRICA) capsule 75 mg, 75 mg, Oral, Daily, Ivor Costa, MD, 75 mg at 12/14/21 4034   rosuvastatin (CRESTOR) tablet 5 mg, 5 mg, Oral, Daily, Ivor Costa, MD, 5 mg at 12/14/21 7425   sertraline (ZOLOFT) tablet 25 mg, 25 mg, Oral, Daily, Ivor Costa, MD, 25 mg at 12/14/21 0930   acetaminophen, hydrALAZINE, loratadine, ondansetron (ZOFRAN) IV, polyethylene glycol-electrolytes   Objective    Vitals:   12/14/21 1604 12/14/21 2045 12/15/21 0520 12/15/21 0759  BP: 99/68 (!) 96/53 128/63 (!) 122/49  Pulse: (!) 54 60 61 (!) 54  Resp: _0 Temp: 97.8 F (36.6 C) 98.1 F (36.7 C) 97.7 F (36.5 C) 98.1 F (36.7 C)  TempSrc: Oral Oral Oral Oral  SpO2: 97% 97% 98% 95%  Weight:   103.9 kg   Height:         Physical Exam Vitals and nursing note reviewed.  Constitutional:      General: She is not in acute distress.    Appearance: She is obese. She is not ill-appearing, toxic-appearing or diaphoretic.  HENT:     Head: Normocephalic and atraumatic.  Nose: Nose normal.     Mouth/Throat:     Mouth: Mucous membranes are moist.     Pharynx: Oropharynx is clear.  Eyes:     General: No scleral icterus.    Extraocular Movements: Extraocular movements intact.     Comments: +Periorbital edema; + conjunctival pallor   Cardiovascular:     Rate and Rhythm: Normal rate and regular rhythm.     Heart sounds: Murmur heard.     No friction rub. No gallop.  Pulmonary:     Effort: Pulmonary effort is normal. No respiratory  distress.     Breath sounds: Normal breath sounds. No wheezing, rhonchi or rales.  Abdominal:     General: Bowel sounds are normal. There is no distension.     Palpations: Abdomen is soft.     Tenderness: There is no abdominal tenderness. There is no guarding or rebound.  Musculoskeletal:     Cervical back: Neck supple.     Right lower leg: Edema present.     Left lower leg: Edema present.  Skin:    General: Skin is warm and dry.     Coloration: Skin is pale. Skin is not jaundiced.  Neurological:     General: No focal deficit present.     Mental Status: She is alert and oriented to person, place, and time. Mental status is at baseline.  Psychiatric:        Mood and Affect: Mood normal.        Behavior: Behavior normal.        Thought Content: Thought content normal.        Judgment: Judgment normal.      Laboratory Data Recent Labs  Lab 12/13/21 0623 12/13/21 1650 12/14/21 0702 12/15/21 0534  WBC 6.0  --  5.0 4.6  HGB 8.6* 8.8* 8.9* 9.1*  HCT 27.2* 28.2* 28.5* 28.7*  PLT 175  --  173 166   Recent Labs  Lab 12/13/21 0623 12/14/21 0702 12/15/21 0534  NA 142 137 141  K 4.0 4.2 4.1  CL 109 105 110  CO2 _0 BUN 25* 19 18  CREATININE 1.43* 1.38* 1.39*  CALCIUM 9.0 9.3 9.0  GLUCOSE 89 86 92   Recent Labs  Lab 12/11/21 0932 12/12/21 0303  INR 1.8* 1.4*      Imaging Studies: No results found.  Assessment:   # UGIB - pt on once daily home protonix - daily prednisone at home in addition to eliquis - ckd with AS -pt has noted melena at home, no hematochezia   # ABLA on chronic anemia - baseline 8-9; p/w 5.7 now 4.9 after 1 L IVF for hypotension (improved); - received 2 uprbc with improvement to 8.2   # hemorrhagic shock - improving with resuscitation after hypotensive at 60/40s   # Afib on eliquis - last dose 12/10/21 pm   # PVD - L leg ischemia s/p intervention 11/2021   # Iron deficiency   # CKD   # HFpEF- g2 diastolic dysfunction; ef  65-70%   # aortic stenosis  Plan:   Undergoing Video Capsule Endoscopy for evaluation of small bowel Hgb stable/improving Continue bid po protonix 40 mg for 8 weeks Continue iron supplementation Eliquis currently being held  Monitor H&H.  Transfusion and resuscitation as per primary team Avoid frequent lab draws to prevent lab induced anemia Supportive care and antiemetics as per primary team Maintain two sites IV access Avoid nsaids Monitor for GIB.  I personally performed  the service.  Management of other medical comorbidities as per primary team  Thank you for allowing [place] to participate in this patient's care. Please don't hesitate to call if any questions or concerns arise.   [name], DO [facility], [facility]  Portions of the record may have been created with voice recognition software. Occasional wrong-word or 'sound-a-like' substitutions may have occurred due to the inherent limitations of voice recognition software.  Read the chart carefully and recognize, using context, where substitutions may have occurred.

## 2021-12-15 NOTE — Progress Notes (Signed)
Subjective:    Patient ID: Rebecca Gilmore, female    DOB: 1940/07/14, 81 y.o.   MRN: [id number] Chief Complaint  Patient presents with   Wound Check    1 week wound check    Rebecca Gilmore is an 81 year old female who presents today for follow-up evaluation of her left femoral endarterectomy.  The main incision has healed well but there is a small open area in her groin.  The patient notes that she has been fatigued since returning home.  She has not established with physical therapy.  She notes that they contacted her but she was busy at the time and asked for them to return a call to her.  She notes that she has not received a return phone call.  She denies any fevers or chills.  She denies any purulent drainage.  She also endorses having some swelling primarily in the left postintervention.    Review of Systems  Cardiovascular:  Positive for leg swelling.  All other systems reviewed and are negative.      Objective:   Physical Exam Vitals reviewed.  HENT:     Head: Normocephalic.  Cardiovascular:     Rate and Rhythm: Normal rate.     Pulses:          Dorsalis pedis pulses are 1+ on the right side and 1+ on the left side.       Posterior tibial pulses are 1+ on the right side and 1+ on the left side.  Pulmonary:     Effort: Pulmonary effort is normal.  Musculoskeletal:     Right lower leg: Edema present.     Left lower leg: Edema present.  Skin:    General: Skin is warm and dry.  Neurological:     Mental Status: She is oriented to person, place, and time.  Psychiatric:        Mood and Affect: Mood normal.        Behavior: Behavior normal.        Thought Content: Thought content normal.        Judgment: Judgment normal.     BP (!) 118/53 (BP Location: Right Arm)   Pulse (!) 59   Resp 15   Past Medical History:  Diagnosis Date   Depression    HBP (high blood pressure)    History of bladder problems    Memory loss    Muscle pain    Osteoporosis    Reflux     Sinus congestion    Swelling    B/L FEET AND LEGS    Social History   Socioeconomic History   Marital status: Married    Spouse name: Not on file   Number of children: Not on file   Years of education: Not on file   Highest education level: Not on file  Occupational History   Not on file  Tobacco Use   Smoking status: Former    Types: Cigarettes    Quit date: 01/12/1993    Years since quitting: 28.9   Smokeless tobacco: Former    Types: Snuff, Chew   Tobacco comments:    DATE QUIT UNKNOWN  Vaping Use   Vaping Use: Never used  Substance and Sexual Activity   Alcohol use: No   Drug use: No   Sexual activity: Not on file  Other Topics Concern   Not on file  Social History Narrative   Not on file   Social Determinants of Health  Financial Resource Strain: Not on file  Food Insecurity: No Food Insecurity (12/11/2021)   Hunger Vital Sign    Worried About Running Out of Food in the Last Year: Never true    Ran Out of Food in the Last Year: Never true  Transportation Needs: No Transportation Needs (12/11/2021)   PRAPARE - Hydrologist (Medical): No    Lack of Transportation (Non-Medical): No  Physical Activity: Not on file  Stress: Not on file  Social Connections: Not on file  Intimate Partner Violence: Not At Risk (12/11/2021)   Humiliation, Afraid, Rape, and Kick questionnaire    Fear of Current or Ex-Partner: No    Emotionally Abused: No    Physically Abused: No    Sexually Abused: No    Past Surgical History:  Procedure Laterality Date   ARTERY BIOPSY Bilateral 12/01/2016   Procedure: BIOPSY TEMPORAL ARTERY;  Surgeon: Conrad Newport, MD;  Location: [place];  Service: Vascular;  Laterality: Bilateral;   CATARACT SURGERY     EYELID SURGERY Bilateral    GROWTH ON FACE     KNEE SURGERY Right    THROMBECTOMY FEMORAL ARTERY Left 11/16/2021   Procedure: THROMBECTOMY FEMORAL ARTERY;  Surgeon: Conrad , MD;  Location: [facility];   Service: Vascular;  Laterality: Left;    Family History  Problem Relation Age of Onset   Heart disease Mother    AAA (abdominal aortic aneurysm) Mother    Heart disease Father     Allergies  Allergen Reactions   Morphine And Related Itching   Kenalog [Triamcinolone Acetonide] Other (See Comments)    FLUSH IN FACE   Penicillins Other (See Comments)    Has patient had a PCN reaction causing immediate rash, facial/tongue/throat swelling, SOB or lightheadedness with hypotension: Unknown Has patient had a PCN reaction causing severe rash involving mucus membranes or skin necrosis: Unknown Has patient had a PCN reaction that required hospitalization:No Has patient had a PCN reaction occurring within the last 10 years: No If all of the above answers are "NO", then may proceed with Cephalosporin use.        Latest Ref Rng & Units 12/14/2021    7:02 AM 12/13/2021    4:50 PM 12/13/2021    6:23 AM  CBC  WBC 4.0 - 10.5 K/uL 5.0   6.0   Hemoglobin 12.0 - 15.0 g/dL 8.9  8.8  8.6   Hematocrit 36.0 - 46.0 % 28.5  28.2  27.2   Platelets 150 - 400 K/uL 173   175       CMP     Component Value Date/Time   NA 137 12/14/2021 0702   K 4.2 12/14/2021 0702   K 3.8 06/06/2012 1632   CL 105 12/14/2021 0702   CO2 26 12/14/2021 0702   GLUCOSE 86 12/14/2021 0702   BUN 19 12/14/2021 0702   CREATININE 1.38 (H) 12/14/2021 0702   CALCIUM 9.3 12/14/2021 0702   PROT 7.2 11/16/2021 0430   ALBUMIN 3.7 11/16/2021 0430   AST 18 11/16/2021 0430   ALT 11 11/16/2021 0430   ALKPHOS 47 11/16/2021 0430   BILITOT 0.6 11/16/2021 0430   GFRNONAA 38 (L) 12/14/2021 0702   GFRAA 37 (L) 01/23/2019 1100     No results found.     Assessment & Plan:   1. Femoral artery occlusion, left ([place]) The patient has a small wound in her left groin.  We set up outpatient PT for the patient  however they contacted the patient and because she was busy at the time, she has not had a chance to recontact the agency.  We will  try to reach facilitate this for the patient.  She does have fatigue today, we discussed undergoing labs to evaluate for anemia but the patient currently did not wish to go into the lab today.  Patient does have an upcoming visit with her PCP labs can be evaluated at that time.  We will have the patient return in 2 weeks for wound reevaluation or sooner if issues arise.   No current facility-administered medications on file prior to visit.   Current Outpatient Medications on File Prior to Visit  Medication Sig Dispense Refill   amitriptyline (ELAVIL) 25 MG tablet Take 25 mg by mouth at bedtime.     apixaban (ELIQUIS) 5 MG TABS tablet Take 1 tablet (5 mg total) by mouth 2 (two) times daily. 60 tablet 3   apixaban (ELIQUIS) 5 MG TABS tablet Take 2 tablets (10 mg total) by mouth 2 (two) times daily. (Patient not taking: Reported on 12/11/2021) 6 tablet 0   cholecalciferol (VITAMIN D3) 25 MCG (1000 UT) tablet Take 1,000 Units by mouth daily.     cyanocobalamin (VITAMIN B12) 1000 MCG tablet Take 1,000 mcg by mouth.     Ferrous Sulfate (IRON) 325 (65 Fe) MG TABS Take 1 tablet by mouth daily.     furosemide (LASIX) 40 MG tablet Take 40 mg by mouth daily. For fluid retention (swollen ankles)     loratadine (CLARITIN) 10 MG tablet Take 10 mg by mouth daily.     metoprolol succinate (TOPROL-XL) 25 MG 24 hr tablet Take 12.5 mg by mouth daily.     oxyCODONE-acetaminophen (PERCOCET/ROXICET) 5-325 MG tablet Take 1 tablet by mouth every 6 (six) hours as needed for moderate pain. (Patient not taking: Reported on 12/11/2021) 30 tablet 0   pantoprazole (PROTONIX) 40 MG tablet Take 40 mg by mouth every morning.     predniSONE (DELTASONE) 1 MG tablet Take 2 mg by mouth daily.     pregabalin (LYRICA) 75 MG capsule Take 1 capsule (75 mg total) by mouth daily. 30 capsule 0   rosuvastatin (CRESTOR) 5 MG tablet Take 5 mg by mouth daily.     sertraline (ZOLOFT) 50 MG tablet Take 25 mg by mouth daily.  0    There are no  Patient Instructions on file for this visit. No follow-ups on file.   Kris Hartmann, NP

## 2021-12-15 NOTE — Discharge Summary (Signed)
Physician Discharge Summary   Patient: Rebecca Gilmore MRN: [id number]  DOB: 11/05/1940   Admit:     Date of Admission: 12/11/2021 Admitted from: home   Discharge: Date of discharge: 12/15/21 Disposition: Home w/ home health PT/OT Condition at discharge: fair  CODE STATUS: FULL CODE     Discharge Physician: Emeterio Reeve, DO [facility]     PCP: Philmore Pali, NP (Inactive)    Discharge Diagnoses: Principal Problem:   GI bleeding Active Problems:   Acute blood loss anemia   Iron deficiency anemia   Chronic diastolic CHF (congestive heart failure) (HCC)   Acute renal failure superimposed on stage 3b chronic kidney disease (HCC)   Atrial fibrillation, chronic (HCC)   Femoral artery occlusion, left (HCC)   HLD (hyperlipidemia)   Myocardial injury   Temporal arteritis ([place])   Depression   Anemia     Recommendations for Outpatient Follow-up:  Follow up with PCP Philmore Pali, NP (Inactive) in 1-2 weeks Please obtain labs/tests: CBC, BMP in 1-2 weeks Please follow up on the following pending results: capsule endoscopy - Dr Virgina Jock or his office should call patient  PCP AND OTHER OUTPATIENT PROVIDERS: SEE BELOW FOR SPECIFIC DISCHARGE INSTRUCTIONS PRINTED FOR PATIENT IN ADDITION TO GENERIC AVS PATIENT INFO    Discharge Instructions     Diet - low sodium heart healthy   Complete by: As directed    Discharge instructions   Complete by: As directed    HOLD Eliquis/apixiban blood thinner until or unless told to restart this by your GI doctor. Advise do not take NSAIDs such as aspirin, ibuprofen or naproxen. Tylenol/acetaminophen is fine to use.   Dr. Virgina Jock or his office will call you with the results of your capsule endoscopy and discuss next steps if needed. If you do not hear back from them by tomorrow, 12/16/2021, please call their office 12/17/2021.   Increase activity slowly   Complete by: As directed    Increase activity slowly   Complete by: As  directed    No wound care   Complete by: As directed    No wound care   Complete by: As directed           Hospital Course: Rebecca Gilmore is a 81 y.o. 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  She  presents to ED from home on 12/11/2021 with dark stool (intermittent x9-10 mos), chest pain x few hrs PTA, and leg edema. 10/05: Initially hypotensive with blood pressure 50s/40s, which improved to 117/39 after given 1 L of LR. Hemoglobin dropped from 8.9 on 11/24/2021 to 5.7 --> 4.9 today.  WBC 7.2, troponin level 42, 37, BNP 262, positive FOBT, temperature normal, heart rate 70, RR 18, oxygen saturation 97% - 100% on room air currently.  Creatinine 1.8.  Chest x-ray negative. Pt is admitted to telemetry bed as inpatient.  Dr. Virgina Jock of GI and Dr. Saralyn Pilar of cardiology consulted.  2 units PRBC.  Per GI: EGD tomorrow pending stability.  If hemodynamically unstable, recommend stat CTA for potential IR/vascular intervention.  Per cardiology: Elevated troponin and other cardiac issues likely related to significant anemia and hypotension/demand ischemia. 10/06: BP stable.  Hgb stable, 7.3 posttransfusion and 8.2 this  morning. Creatinine improved at 1.5. EGD today (+)gastritis. Colonoscopy planned tomorrow.  10/07: H/H stable, Hgb 8.6 this morning. Colonoscopy  fair prep may miss small AVM but no overt bleeding or dark stool was noted. Plan stay thru Monday for capsule endoscopy (today is Saturday) on CLD.  10/08: Spoke on phone w/ Dr Virgina Jock - given [place] stable and brown stool on colonoscopy, unlikely to have small intestinal bleed, would recommend continue holding Eliquis and keep on iron  supplements and PPI, option to defer capsule. D/w patient, she would like to stay for the capsule endoscopy.   Consultants:  Gastroenterology Cardiology  Procedures: EGD 12/12/21  --> hiatal hernia, gastritis Colonoscopy 12/13/2021 --> fair prep may miss small AVM but no overt bleeding or dark stool was noted. Consider outpatient capsule endoscopy       ASSESSMENT & PLAN:   Principal Problem:   GI bleeding Active Problems:   Acute blood loss anemia   Iron deficiency anemia   Chronic diastolic CHF (congestive heart failure) (HCC)   Acute renal failure superimposed on stage 3b chronic kidney disease (HCC)   Atrial fibrillation, chronic (HCC)   Femoral artery occlusion, left (HCC)   HLD (hyperlipidemia)   Myocardial injury   Temporal arteritis (HCC)   Depression   GI bleeding GI bleeding, acute blood loss anemia and history of iron deficiency anemia:  Acute blood loss anemia Hx iron deficiency anemia Hemoglobin dropped from 8.9 --> 5.7 --> 4.9.  Initially hypotensive which responded to IV fluid resuscitation.  Hgb stable over 8  today  Lab: INR, PTT and type screen, anemia panel - no concerns Status post 2 units PRBC, Hgb 8.2 this morning, continue to monitor CBC EGD 2 days ago --> hiatal hernia, gastritic Colonoscopy yesterday --> fair prep may miss small AVM but no overt bleeding or dark stool was noted.  Plan capsule endoscopy --> pt refusing this unless we can do it here but we can't arrange this until Monday. D/w Dr Vicente Males yesterday, can keep her here thru the weekend on CLD Continue pantoprazole 40 mg bid Zofran IV for nausea Avoid NSAIDs and Rx DVT PPx Maintain IV access (2 large bore IVs if possible). Transfuse as necessary, if Hgb<7.0 Continue iron supplement hold Eliquis Capsule endoscopy pending   Chronic diastolic CHF (congestive heart failure) ([place]) - NO EXACERBATION 2D echo on 11/17/2021 showed EF of 65-70% with grade 2 diastolic dysfunction.  Patient has  elevated BNP 262, bilateral leg edema, but no shortness of breath, indicating mild fluid overload.   Restarted Lasix on discharge   Acute renal failure superimposed on stage 3b chronic kidney disease ([place]) - AKI RESOLVED Baseline creatinine 1.30 on 11/24/2021.  Her creatinine on admission was 1.8, BUN 41, GFR 28. Likely due to volume depletion and continuation of diuretics Creatinine improved now to 1.3  Atrial fibrillation, chronic (HCC) Heart rate controlled, sinus. Continue metoprolol with holding parameters for blood pressure  Hold Eliquis  Femoral artery occlusion, left (HCC) Ongoing healing wound in left groin S/P Left femoral endarterectomy with thrombectomy due to critical limb ischemia. Patient still has ongoing healing wound in the left groin area.  No fever or leukocytosis. Continue home doxycycline  HLD (hyperlipidemia) Crestor  Myocardial injury Myocardial injury due to severe anemia.  Consulted Dr. Saralyn Pilar of cardiology. Troponin level 42, 37. Held aspirin due to GI bleeding Continue Crestor Per cardio - no further diagnostics at this time continue current tx and hold apixiban   Temporal arteritis (HCC) Continue low-dose prednisone  2 mg daily  Depression Continue home medications            Discharge Instructions  Allergies as of 12/15/2021       Reactions   Morphine And Related Itching   Kenalog [triamcinolone Acetonide] Other (See Comments)   FLUSH IN FACE   Penicillins Other (See Comments)   Has patient had a PCN reaction causing immediate rash, facial/tongue/throat swelling, SOB or lightheadedness with hypotension: Unknown Has patient had a PCN reaction causing severe rash involving mucus membranes or skin necrosis: Unknown Has patient had a PCN reaction that required hospitalization:No Has patient had a PCN reaction occurring within the last 10 years: No If all of the above answers are "NO", then may proceed with Cephalosporin use.         Medication List     STOP taking these medications    apixaban 5 MG Tabs tablet Commonly known as: ELIQUIS   oxyCODONE-acetaminophen 5-325 MG tablet Commonly known as: PERCOCET/ROXICET       TAKE these medications    acetaminophen 325 MG tablet Commonly known as: TYLENOL Take 2 tablets (650 mg total) by mouth every 6 (six) hours as needed for mild pain or fever.   amitriptyline 25 MG tablet Commonly known as: ELAVIL Take 25 mg by mouth at bedtime.   cholecalciferol 25 MCG (1000 UNIT) tablet Commonly known as: VITAMIN D3 Take 1,000 Units by mouth daily.   cyanocobalamin 1000 MCG tablet Commonly known as: VITAMIN B12 Take 1,000 mcg by mouth.   doxycycline 100 MG capsule Commonly known as: VIBRAMYCIN Take 1 capsule (100 mg total) by mouth 2 (two) times daily.   furosemide 40 MG tablet Commonly known as: LASIX Take 40 mg by mouth daily. For fluid retention (swollen ankles)   Iron 325 (65 Fe) MG Tabs Take 1 tablet by mouth daily.   loratadine 10 MG tablet Commonly known as: CLARITIN Take 10 mg by mouth daily.   metoprolol succinate 25 MG 24 hr tablet Commonly known as: TOPROL-XL Take 12.5 mg by mouth daily.   pantoprazole 40 MG tablet Commonly known as: PROTONIX Take 40 mg by mouth every morning.   predniSONE 1 MG tablet Commonly known as: DELTASONE Take 2 mg by mouth daily.   pregabalin 75 MG capsule Commonly known as: Lyrica Take 1 capsule (75 mg total) by mouth daily.   rosuvastatin 5 MG tablet Commonly known as: CRESTOR Take 5 mg by mouth daily.   sertraline 50 MG tablet Commonly known as: ZOLOFT Take 25 mg by mouth daily.         Follow-up Information     [name], DO. Call in 2 day(s).   Specialty: Gastroenterology Why: if you do not hear back about your capsule endoscopy results by end of day 12/16/2021 / sooner if you have any problems with additional black or bloody stool, severe abdominal pain, vomiting black/bloody  material. Contact information: [facility] [place] [address] [phone]                 Allergies  Allergen Reactions   Morphine And Related Itching   Kenalog [Triamcinolone Acetonide] Other (See Comments)    FLUSH IN FACE   Penicillins Other (See Comments)    Has patient had a PCN reaction causing immediate rash, facial/tongue/throat swelling, SOB or lightheadedness with hypotension: Unknown Has patient had a PCN reaction causing severe rash involving mucus membranes or skin necrosis: Unknown Has patient had a PCN reaction that required  hospitalization:No Has patient had a PCN reaction occurring within the last 10 years: No If all of the above answers are "NO", then may proceed with Cephalosporin use.      Subjective: Pt feeling well this morning other than feeling hungry for "real food," no CP/SOB, no abdominal pain   Discharge Exam: BP (!) 122/49 (BP Location: Right Arm)   Pulse (!) 54   Temp 98.1 F (36.7 C) (Oral)   Resp 18   Ht 5' 4" (1.626 m)   Wt 103.9 kg   SpO2 95%   BMI 39.32 kg/m  General: Pt is alert, awake, not in acute distress Cardiovascular: RRR, S1/S2 +, no rubs, no gallops Respiratory: CTA bilaterally, no wheezing, no rhonchi Abdominal: Soft, NT, ND, bowel sounds + Extremities: no edema, no cyanosis     The results of significant diagnostics from this hospitalization (including imaging, microbiology, ancillary and laboratory) are listed below for reference.     Microbiology: No results found for this or any previous visit (from the past 240 hour(s)).   Labs: BNP (last 3 results) Recent Labs    12/11/21 0903  BNP 286.7*   Basic Metabolic Panel: Recent Labs  Lab 12/11/21 0730 12/12/21 0303 12/13/21 0623 12/14/21 0702 12/15/21 0534  NA 138 142 142 137 141  K 4.2 4.0 4.0 4.2 4.1  CL 102 109 109 105 110  CO2 _0 GLUCOSE 97 81 89 86 92  BUN 41* 34* 25* 19 18  CREATININE 1.80* 1.51*  1.43* 1.38* 1.39*  CALCIUM 9.2 9.0 9.0 9.3 9.0   Liver Function Tests: No results for input(s): "AST", "ALT", "ALKPHOS", "BILITOT", "PROT", "ALBUMIN" in the last 168 hours. No results for input(s): "LIPASE", "AMYLASE" in the last 168 hours. No results for input(s): "AMMONIA" in the last 168 hours. CBC: Recent Labs  Lab 12/12/21 0303 12/12/21 5198 12/12/21 1841 12/13/21 0623 12/13/21 1650 12/14/21 0702 12/15/21 0534  WBC 7.1 6.2  --  6.0  --  5.0 4.6  HGB 8.2* 8.5* 10.3* 8.6* 8.8* 8.9* 9.1*  HCT 25.4* 25.6* 32.4* 27.2* 28.2* 28.5* 28.7*  MCV 91.7 91.4  --  93.8  --  96.3 96.3  PLT 169 155  --  175  --  173 166   Cardiac Enzymes: No results for input(s): "CKTOTAL", "CKMB", "CKMBINDEX", "TROPONINI" in the last 168 hours. BNP: Invalid input(s): "POCBNP" CBG: Recent Labs  Lab 12/12/21 0834 12/13/21 0759 12/13/21 1202 12/14/21 0742 12/15/21 0758  GLUCAP 82 83 70 81 84   D-Dimer No results for input(s): "DDIMER" in the last 72 hours. Hgb A1c No results for input(s): "HGBA1C" in the last 72 hours. Lipid Profile No results for input(s): "CHOL", "HDL", "LDLCALC", "TRIG", "CHOLHDL", "LDLDIRECT" in the last 72 hours. Thyroid function studies No results for input(s): "TSH", "T4TOTAL", "T3FREE", "THYROIDAB" in the last 72 hours.  Invalid input(s): "FREET3" Anemia work up No results for input(s): "VITAMINB12", "FOLATE", "FERRITIN", "TIBC", "IRON", "RETICCTPCT" in the last 72 hours. Urinalysis N/a Sepsis Labs Recent Labs  Lab 12/12/21 2429 12/13/21 0623 12/14/21 0702 12/15/21 0534  WBC 6.2 6.0 5.0 4.6   Microbiology No results found for this or any previous visit (from the past 240 hour(s)). Imaging DG Chest 2 View  Result Date: 12/11/2021 CLINICAL DATA:  Chest pain and shortness of breath. EXAM: CHEST - 2 VIEW COMPARISON:  11/19/2021 FINDINGS: Asymmetric elevation right hemidiaphragm The cardio pericardial silhouette is enlarged. The lungs are clear without focal  pneumonia, edema, pneumothorax or pleural  effusion. The visualized bony structures of the thorax are unremarkable. IMPRESSION: No active cardiopulmonary disease. Electronically Signed   By: Misty Stanley M.D.   On: 12/11/2021 07:53      Time coordinating discharge: over 30 minutes  SIGNED:  Emeterio Reeve DO [facility]

## 2021-12-15 NOTE — Care Management Important Message (Signed)
Important Message  Patient Details  Name: Rebecca Gilmore MRN: [id number] Date of Birth: 1941/03/01   Medicare Important Message Given:  Yes     Dannette Barbara 12/15/2021, 11:18 AM

## 2021-12-16 ENCOUNTER — Encounter: Payer: Self-pay | Admitting: Gastroenterology

## 2021-12-18 DIAGNOSIS — D649 Anemia, unspecified: Secondary | ICD-10-CM | POA: Diagnosis not present

## 2021-12-18 DIAGNOSIS — I482 Chronic atrial fibrillation, unspecified: Secondary | ICD-10-CM | POA: Diagnosis not present

## 2021-12-18 DIAGNOSIS — Z79899 Other long term (current) drug therapy: Secondary | ICD-10-CM | POA: Diagnosis not present

## 2021-12-18 DIAGNOSIS — N184 Chronic kidney disease, stage 4 (severe): Secondary | ICD-10-CM | POA: Diagnosis not present

## 2021-12-18 DIAGNOSIS — Z7689 Persons encountering health services in other specified circumstances: Secondary | ICD-10-CM | POA: Diagnosis not present

## 2021-12-22 DIAGNOSIS — K922 Gastrointestinal hemorrhage, unspecified: Secondary | ICD-10-CM | POA: Diagnosis not present

## 2021-12-22 DIAGNOSIS — I5032 Chronic diastolic (congestive) heart failure: Secondary | ICD-10-CM | POA: Diagnosis not present

## 2021-12-22 DIAGNOSIS — Z48812 Encounter for surgical aftercare following surgery on the circulatory system: Secondary | ICD-10-CM | POA: Diagnosis not present

## 2021-12-22 DIAGNOSIS — D62 Acute posthemorrhagic anemia: Secondary | ICD-10-CM | POA: Diagnosis not present

## 2021-12-22 DIAGNOSIS — I130 Hypertensive heart and chronic kidney disease with heart failure and stage 1 through stage 4 chronic kidney disease, or unspecified chronic kidney disease: Secondary | ICD-10-CM | POA: Diagnosis not present

## 2021-12-23 ENCOUNTER — Ambulatory Visit (INDEPENDENT_AMBULATORY_CARE_PROVIDER_SITE_OTHER): Payer: Medicare Other | Admitting: Nurse Practitioner

## 2021-12-23 ENCOUNTER — Encounter (INDEPENDENT_AMBULATORY_CARE_PROVIDER_SITE_OTHER): Payer: Self-pay | Admitting: Nurse Practitioner

## 2021-12-23 VITALS — BP 79/49 | HR 61 | Resp 16 | Ht 63.0 in | Wt 234.0 lb

## 2021-12-23 DIAGNOSIS — N189 Chronic kidney disease, unspecified: Secondary | ICD-10-CM | POA: Insufficient documentation

## 2021-12-23 DIAGNOSIS — N184 Chronic kidney disease, stage 4 (severe): Secondary | ICD-10-CM | POA: Insufficient documentation

## 2021-12-23 DIAGNOSIS — I70202 Unspecified atherosclerosis of native arteries of extremities, left leg: Secondary | ICD-10-CM

## 2021-12-23 MED ORDER — CLOPIDOGREL BISULFATE 75 MG PO TABS: 75.0000 mg | ORAL_TABLET | Freq: Every day | ORAL | 6 refills | Status: DC

## 2021-12-26 ENCOUNTER — Encounter (INDEPENDENT_AMBULATORY_CARE_PROVIDER_SITE_OTHER): Payer: Medicare Other

## 2021-12-26 ENCOUNTER — Ambulatory Visit (INDEPENDENT_AMBULATORY_CARE_PROVIDER_SITE_OTHER): Payer: Medicare Other | Admitting: Nurse Practitioner

## 2021-12-29 ENCOUNTER — Encounter (INDEPENDENT_AMBULATORY_CARE_PROVIDER_SITE_OTHER): Payer: Self-pay | Admitting: Nurse Practitioner

## 2021-12-29 DIAGNOSIS — I130 Hypertensive heart and chronic kidney disease with heart failure and stage 1 through stage 4 chronic kidney disease, or unspecified chronic kidney disease: Secondary | ICD-10-CM | POA: Diagnosis not present

## 2021-12-29 DIAGNOSIS — I5032 Chronic diastolic (congestive) heart failure: Secondary | ICD-10-CM | POA: Diagnosis not present

## 2021-12-29 DIAGNOSIS — D62 Acute posthemorrhagic anemia: Secondary | ICD-10-CM | POA: Diagnosis not present

## 2021-12-29 DIAGNOSIS — Z48812 Encounter for surgical aftercare following surgery on the circulatory system: Secondary | ICD-10-CM | POA: Diagnosis not present

## 2021-12-29 DIAGNOSIS — K922 Gastrointestinal hemorrhage, unspecified: Secondary | ICD-10-CM | POA: Diagnosis not present

## 2021-12-29 NOTE — Progress Notes (Signed)
Subjective:    Patient ID: Rebecca Gilmore, female    DOB: 1940-03-14, 81 y.o.   MRN: [id number] Chief Complaint  Patient presents with   Follow-up    2 week no studies    Rebecca Gilmore is an 81 year old female who presents today for follow-up evaluation of her left femoral endarterectomy.  The main incision has healed well but there is a small open area in her groin.  The patient has had been having severe fatigue since returning home and was found to be anemic.  She has had her Eliquis stopped.      Review of Systems  Skin:  Positive for wound.  All other systems reviewed and are negative.      Objective:   Physical Exam Vitals reviewed.  HENT:     Head: Normocephalic.  Cardiovascular:     Rate and Rhythm: Normal rate.     Pulses:          Dorsalis pedis pulses are 1+ on the left side.  Pulmonary:     Effort: Pulmonary effort is normal.  Skin:    General: Skin is warm and dry.  Neurological:     Mental Status: She is alert and oriented to person, place, and time.  Psychiatric:        Mood and Affect: Mood normal.        Behavior: Behavior normal.        Thought Content: Thought content normal.        Judgment: Judgment normal.     BP (!) 79/49 (BP Location: Left Arm)   Pulse 61   Resp 16   Ht _0  (1.6 m)   Wt 234 lb (106.1 kg)   BMI 41.45 kg/m   Past Medical History:  Diagnosis Date   Depression    HBP (high blood pressure)    History of bladder problems    Memory loss    Muscle pain    Osteoporosis    Reflux    Sinus congestion    Swelling    B/L FEET AND LEGS    Social History   Socioeconomic History   Marital status: Married    Spouse name: Not on file   Number of children: Not on file   Years of education: Not on file   Highest education level: Not on file  Occupational History   Not on file  Tobacco Use   Smoking status: Former    Types: Cigarettes    Quit date: 01/12/1993    Years since quitting: 28.9   Smokeless tobacco:  Former    Types: Snuff, Chew   Tobacco comments:    DATE QUIT UNKNOWN  Vaping Use   Vaping Use: Never used  Substance and Sexual Activity   Alcohol use: No   Drug use: No   Sexual activity: Not on file  Other Topics Concern   Not on file  Social History Narrative   Not on file   Social Determinants of Health   Financial Resource Strain: Not on file  Food Insecurity: No Food Insecurity (12/11/2021)   Hunger Vital Sign    Worried About Running Out of Food in the Last Year: Never true    Ran Out of Food in the Last Year: Never true  Transportation Needs: No Transportation Needs (12/11/2021)   PRAPARE - Hydrologist (Medical): No    Lack of Transportation (Non-Medical): No  Physical Activity: Not on file  Stress: Not on file  Social Connections: Not on file  Intimate Partner Violence: Not At Risk (12/11/2021)   Humiliation, Afraid, Rape, and Kick questionnaire    Fear of Current or Ex-Partner: No    Emotionally Abused: No    Physically Abused: No    Sexually Abused: No    Past Surgical History:  Procedure Laterality Date   ARTERY BIOPSY Bilateral 12/01/2016   Procedure: BIOPSY TEMPORAL ARTERY;  Surgeon: Conrad Worcester, MD;  Location: [facility];  Service: Vascular;  Laterality: Bilateral;   CATARACT SURGERY     COLONOSCOPY WITH PROPOFOL N/A 12/13/2021   Procedure: COLONOSCOPY WITH PROPOFOL;  Surgeon: Jonathon Bellows, MD;  Location: [facility] ENDOSCOPY;  Service: Gastroenterology;  Laterality: N/A;   ESOPHAGOGASTRODUODENOSCOPY (EGD) WITH PROPOFOL N/A 12/12/2021   Procedure: ESOPHAGOGASTRODUODENOSCOPY (EGD) WITH PROPOFOL;  Surgeon: [name], DO;  Location[facility] ENDOSCOPY;  Service: Gastroenterology;  Laterality: N/A;   EYELID SURGERY Bilateral    GIVENS CAPSULE STUDY N/A 12/15/2021   Procedure: GIVENS CAPSULE STUDY;  Surgeon: [name], DO;  Location: [facility] ENDOSCOPY;  Service: Gastroenterology;  Laterality: N/A;   GROWTH ON FACE     KNEE  SURGERY Right    THROMBECTOMY FEMORAL ARTERY Left 11/16/2021   Procedure: THROMBECTOMY FEMORAL ARTERY;  Surgeon: Conrad Magnolia, MD;  Location: [facility] ORS;  Service: Vascular;  Laterality: Left;    Family History  Problem Relation Age of Onset   Heart disease Mother    AAA (abdominal aortic aneurysm) Mother    Heart disease Father     Allergies  Allergen Reactions   Morphine And Related Itching   Kenalog [Triamcinolone Acetonide] Other (See Comments)    FLUSH IN FACE   Penicillins Other (See Comments)    Has patient had a PCN reaction causing immediate rash, facial/tongue/throat swelling, SOB or lightheadedness with hypotension: Unknown Has patient had a PCN reaction causing severe rash involving mucus membranes or skin necrosis: Unknown Has patient had a PCN reaction that required hospitalization:No Has patient had a PCN reaction occurring within the last 10 years: No If all of the above answers are "NO", then may proceed with Cephalosporin use.        Latest Ref Rng & Units 12/15/2021    5:34 AM 12/14/2021    7:02 AM 12/13/2021    4:50 PM  CBC  WBC 4.0 - 10.5 K/uL 4.6  5.0    Hemoglobin 12.0 - 15.0 g/dL 9.1  8.9  8.8   Hematocrit 36.0 - 46.0 % 28.7  28.5  28.2   Platelets 150 - 400 K/uL 166  173        CMP     Component Value Date/Time   NA 141 12/15/2021 0534   K 4.1 12/15/2021 0534   K 3.8 06/06/2012 1632   CL 110 12/15/2021 0534   CO2 26 12/15/2021 0534   GLUCOSE 92 12/15/2021 0534   BUN 18 12/15/2021 0534   CREATININE 1.39 (H) 12/15/2021 0534   CALCIUM 9.0 12/15/2021 0534   PROT 7.2 11/16/2021 0430   ALBUMIN 3.7 11/16/2021 0430   AST 18 11/16/2021 0430   ALT 11 11/16/2021 0430   ALKPHOS 47 11/16/2021 0430   BILITOT 0.6 11/16/2021 0430   GFRNONAA 38 (L) 12/15/2021 0534   GFRAA 37 (L) 01/23/2019 1100     No results found.     Assessment & Plan:   1. Femoral artery occlusion, left (HCC) Due to GI bleed the patient had to stop her Eliquis.  We will  Eliquis to be optimal given the recent anemia we will try to place the patient on Plavix with hope to maintain patency of her recent intervention.  The patient will use an over-the-counter hydrocolloid dressing for her groin wound.  We will have her return in 2 to 3 weeks for reevaluation.   Current Outpatient Medications on File Prior to Visit  Medication Sig Dispense Refill   acetaminophen (TYLENOL) 325 MG tablet Take 2 tablets (650 mg total) by mouth every 6 (six) hours as needed for mild pain or fever.     amitriptyline (ELAVIL) 25 MG tablet Take 25 mg by mouth at bedtime.     cholecalciferol (VITAMIN D3) 25 MCG (1000 UT) tablet Take 1,000 Units by mouth daily.     cyanocobalamin (VITAMIN B12) 1000 MCG tablet Take 1,000 mcg by mouth.     doxycycline (VIBRAMYCIN) 100 MG capsule Take 1 capsule (100 mg total) by mouth 2 (two) times daily. 14 capsule 0   Ferrous Sulfate (IRON) 325 (65 Fe) MG TABS Take 1 tablet by mouth daily.     furosemide (LASIX) 40 MG tablet Take 40 mg by mouth daily. For fluid retention (swollen ankles)     pantoprazole (PROTONIX) 40 MG tablet Take 40 mg by mouth every morning.     predniSONE (DELTASONE) 1 MG tablet Take 2 mg by mouth daily.     pregabalin (LYRICA) 75 MG capsule Take 1 capsule (75 mg total) by mouth daily. 30 capsule 0   rosuvastatin (CRESTOR) 5 MG tablet Take 5 mg by mouth daily.     sertraline (ZOLOFT) 50 MG tablet Take 25 mg by mouth daily.  0   loratadine (CLARITIN) 10 MG tablet Take 10 mg by mouth daily. (Patient not taking: Reported on 12/23/2021)     metoprolol succinate (TOPROL-XL) 25 MG 24 hr tablet Take 12.5 mg by mouth daily. (Patient not taking: Reported on 12/23/2021)     No current facility-administered medications on file prior to visit.    There are no Patient Instructions on file for this visit. No follow-ups on file.   Kris Hartmann, NP

## 2022-01-01 DIAGNOSIS — I5032 Chronic diastolic (congestive) heart failure: Secondary | ICD-10-CM | POA: Diagnosis not present

## 2022-01-01 DIAGNOSIS — K922 Gastrointestinal hemorrhage, unspecified: Secondary | ICD-10-CM | POA: Diagnosis not present

## 2022-01-01 DIAGNOSIS — I130 Hypertensive heart and chronic kidney disease with heart failure and stage 1 through stage 4 chronic kidney disease, or unspecified chronic kidney disease: Secondary | ICD-10-CM | POA: Diagnosis not present

## 2022-01-01 DIAGNOSIS — D62 Acute posthemorrhagic anemia: Secondary | ICD-10-CM | POA: Diagnosis not present

## 2022-01-01 DIAGNOSIS — Z48812 Encounter for surgical aftercare following surgery on the circulatory system: Secondary | ICD-10-CM | POA: Diagnosis not present

## 2022-01-05 ENCOUNTER — Encounter (INDEPENDENT_AMBULATORY_CARE_PROVIDER_SITE_OTHER): Payer: Self-pay

## 2022-01-05 DIAGNOSIS — D62 Acute posthemorrhagic anemia: Secondary | ICD-10-CM | POA: Diagnosis not present

## 2022-01-05 DIAGNOSIS — I130 Hypertensive heart and chronic kidney disease with heart failure and stage 1 through stage 4 chronic kidney disease, or unspecified chronic kidney disease: Secondary | ICD-10-CM | POA: Diagnosis not present

## 2022-01-05 DIAGNOSIS — I5032 Chronic diastolic (congestive) heart failure: Secondary | ICD-10-CM | POA: Diagnosis not present

## 2022-01-05 DIAGNOSIS — Z48812 Encounter for surgical aftercare following surgery on the circulatory system: Secondary | ICD-10-CM | POA: Diagnosis not present

## 2022-01-05 DIAGNOSIS — K922 Gastrointestinal hemorrhage, unspecified: Secondary | ICD-10-CM | POA: Diagnosis not present

## 2022-01-07 ENCOUNTER — Other Ambulatory Visit
Admission: RE | Admit: 2022-01-07 | Discharge: 2022-01-07 | Disposition: A | Discharge status: Home / Self Care | Payer: Medicare Other | Attending: Nurse Practitioner | Admitting: Nurse Practitioner

## 2022-01-07 ENCOUNTER — Encounter (INDEPENDENT_AMBULATORY_CARE_PROVIDER_SITE_OTHER): Payer: Self-pay | Admitting: Nurse Practitioner

## 2022-01-07 ENCOUNTER — Ambulatory Visit (INDEPENDENT_AMBULATORY_CARE_PROVIDER_SITE_OTHER): Payer: Medicare Other | Admitting: Nurse Practitioner

## 2022-01-07 VITALS — BP 97/62 | HR 70 | Resp 18 | Ht 63.0 in | Wt 238.8 lb

## 2022-01-07 DIAGNOSIS — K922 Gastrointestinal hemorrhage, unspecified: Secondary | ICD-10-CM | POA: Insufficient documentation

## 2022-01-07 DIAGNOSIS — I70202 Unspecified atherosclerosis of native arteries of extremities, left leg: Secondary | ICD-10-CM

## 2022-01-07 LAB — BASIC METABOLIC PANEL
Anion gap: 8 (ref 5–15)
BUN: 27 mg/dL — ABNORMAL HIGH (ref 8–23)
CO2: 26 mmol/L (ref 22–32)
Calcium: 9.1 mg/dL (ref 8.9–10.3)
Chloride: 106 mmol/L (ref 98–111)
Creatinine, Ser: 1.75 mg/dL — ABNORMAL HIGH (ref 0.44–1.00)
GFR, Estimated: 29 mL/min — ABNORMAL LOW (ref >60)
Glucose, Bld: 109 mg/dL — ABNORMAL HIGH (ref 70–99)
Potassium: 3.7 mmol/L (ref 3.5–5.1)
Sodium: 140 mmol/L (ref 135–145)

## 2022-01-08 ENCOUNTER — Ambulatory Visit (INDEPENDENT_AMBULATORY_CARE_PROVIDER_SITE_OTHER): Payer: Medicare Other | Admitting: Nurse Practitioner

## 2022-01-08 DIAGNOSIS — M316 Other giant cell arteritis: Secondary | ICD-10-CM | POA: Diagnosis not present

## 2022-01-08 DIAGNOSIS — D472 Monoclonal gammopathy: Secondary | ICD-10-CM | POA: Diagnosis not present

## 2022-01-08 DIAGNOSIS — N1832 Chronic kidney disease, stage 3b: Secondary | ICD-10-CM | POA: Diagnosis not present

## 2022-01-08 DIAGNOSIS — I10 Essential (primary) hypertension: Secondary | ICD-10-CM | POA: Diagnosis not present

## 2022-01-08 DIAGNOSIS — R600 Localized edema: Secondary | ICD-10-CM | POA: Diagnosis not present

## 2022-01-12 DIAGNOSIS — K922 Gastrointestinal hemorrhage, unspecified: Secondary | ICD-10-CM | POA: Diagnosis not present

## 2022-01-12 DIAGNOSIS — I130 Hypertensive heart and chronic kidney disease with heart failure and stage 1 through stage 4 chronic kidney disease, or unspecified chronic kidney disease: Secondary | ICD-10-CM | POA: Diagnosis not present

## 2022-01-12 DIAGNOSIS — I5032 Chronic diastolic (congestive) heart failure: Secondary | ICD-10-CM | POA: Diagnosis not present

## 2022-01-12 DIAGNOSIS — Z48812 Encounter for surgical aftercare following surgery on the circulatory system: Secondary | ICD-10-CM | POA: Diagnosis not present

## 2022-01-12 DIAGNOSIS — D62 Acute posthemorrhagic anemia: Secondary | ICD-10-CM | POA: Diagnosis not present

## 2022-01-15 ENCOUNTER — Telehealth (INDEPENDENT_AMBULATORY_CARE_PROVIDER_SITE_OTHER): Payer: Self-pay

## 2022-01-15 DIAGNOSIS — I214 Non-ST elevation (NSTEMI) myocardial infarction: Secondary | ICD-10-CM | POA: Diagnosis not present

## 2022-01-15 NOTE — Telephone Encounter (Signed)
Rebecca Gilmore stating that her mom was seen last week and her HbA1c was ordered to be checked and she would like to get those results.  Please advise

## 2022-01-18 ENCOUNTER — Encounter (INDEPENDENT_AMBULATORY_CARE_PROVIDER_SITE_OTHER): Payer: Self-pay | Admitting: Nurse Practitioner

## 2022-01-18 NOTE — Progress Notes (Signed)
Subjective:    Patient ID: Rebecca Gilmore, female    DOB: 1941-02-09, 81 y.o.   MRN: [id number] No chief complaint on file.   latish toutant is an 81 year old female who presents today for follow-up evaluation of her left femoral endarterectomy.  The previous small open wound in her groin is healed at this time.  The patient has had been having severe fatigue since returning home and was found to be anemic.  She has had her Eliquis stopped, but has been Plavix every other day.  There has been no rest pain or claudication-like symptoms.     Review of Systems  Skin:  Negative for wound.  All other systems reviewed and are negative.      Objective:   Physical Exam Vitals reviewed.  HENT:     Head: Normocephalic.  Cardiovascular:     Rate and Rhythm: Normal rate.     Pulses:          Dorsalis pedis pulses are 1+ on the right side and 1+ on the left side.       Posterior tibial pulses are 1+ on the right side and 1+ on the left side.  Pulmonary:     Effort: Pulmonary effort is normal.  Skin:    General: Skin is warm and dry.  Neurological:     Mental Status: She is alert and oriented to person, place, and time.  Psychiatric:        Mood and Affect: Mood normal.        Behavior: Behavior normal.        Thought Content: Thought content normal.        Judgment: Judgment normal.     BP 97/62 (BP Location: Right Arm)   Pulse 70   Resp 18   Ht 5' 3" (1.6 m)   Wt 238 lb 12.8 oz (108.3 kg)   BMI 42.30 kg/m   Past Medical History:  Diagnosis Date   Depression    HBP (high blood pressure)    History of bladder problems    Memory loss    Muscle pain    Osteoporosis    Reflux    Sinus congestion    Swelling    B/L FEET AND LEGS    Social History   Socioeconomic History   Marital status: Married    Spouse name: Not on file   Number of children: Not on file   Years of education: Not on file   Highest education level: Not on file  Occupational History   Not on  file  Tobacco Use   Smoking status: Former    Types: Cigarettes    Quit date: 01/12/1993    Years since quitting: 29.0   Smokeless tobacco: Former    Types: Snuff, Chew   Tobacco comments:    DATE QUIT UNKNOWN  Vaping Use   Vaping Use: Never used  Substance and Sexual Activity   Alcohol use: No   Drug use: No   Sexual activity: Not on file  Other Topics Concern   Not on file  Social History Narrative   Not on file   Social Determinants of Health   Financial Resource Strain: Not on file  Food Insecurity: No Food Insecurity (12/11/2021)   Hunger Vital Sign    Worried About Running Out of Food in the Last Year: Never true    Ran Out of Food in the Last Year: Never true  Transportation Needs: No Transportation Needs (  12/11/2021)   PRAPARE - Hydrologist (Medical): No    Lack of Transportation (Non-Medical): No  Physical Activity: Not on file  Stress: Not on file  Social Connections: Not on file  Intimate Partner Violence: Not At Risk (12/11/2021)   Humiliation, Afraid, Rape, and Kick questionnaire    Fear of Current or Ex-Partner: No    Emotionally Abused: No    Physically Abused: No    Sexually Abused: No    Past Surgical History:  Procedure Laterality Date   ARTERY BIOPSY Bilateral 12/01/2016   Procedure: BIOPSY TEMPORAL ARTERY;  Surgeon: Conrad Springview, MD;  Location: [facility];  Service: Vascular;  Laterality: Bilateral;   CATARACT SURGERY     COLONOSCOPY WITH PROPOFOL N/A 12/13/2021   Procedure: COLONOSCOPY WITH PROPOFOL;  Surgeon: Jonathon Bellows, MD;  Location: [facility] ENDOSCOPY;  Service: Gastroenterology;  Laterality: N/A;   ESOPHAGOGASTRODUODENOSCOPY (EGD) WITH PROPOFOL N/A 12/12/2021   Procedure: ESOPHAGOGASTRODUODENOSCOPY (EGD) WITH PROPOFOL;  Surgeon: [name], DO;  Location: [facility] ENDOSCOPY;  Service: Gastroenterology;  Laterality: N/A;   EYELID SURGERY Bilateral    GIVENS CAPSULE STUDY N/A 12/15/2021   Procedure: GIVENS CAPSULE  STUDY;  Surgeon: [name], DO;  Location: [facility] ENDOSCOPY;  Service: Gastroenterology;  Laterality: N/A;   GROWTH ON FACE     KNEE SURGERY Right    THROMBECTOMY FEMORAL ARTERY Left 11/16/2021   Procedure: THROMBECTOMY FEMORAL ARTERY;  Surgeon: Conrad Rowena, MD;  Location: [facility] ORS;  Service: Vascular;  Laterality: Left;    Family History  Problem Relation Age of Onset   Heart disease Mother    AAA (abdominal aortic aneurysm) Mother    Heart disease Father     Allergies  Allergen Reactions   Morphine And Related Itching   Kenalog [Triamcinolone Acetonide] Other (See Comments)    FLUSH IN FACE   Penicillins Other (See Comments)    Has patient had a PCN reaction causing immediate rash, facial/tongue/throat swelling, SOB or lightheadedness with hypotension: Unknown Has patient had a PCN reaction causing severe rash involving mucus membranes or skin necrosis: Unknown Has patient had a PCN reaction that required hospitalization:No Has patient had a PCN reaction occurring within the last 10 years: No If all of the above answers are "NO", then may proceed with Cephalosporin use.        Latest Ref Rng & Units 12/15/2021    5:34 AM 12/14/2021    7:02 AM 12/13/2021    4:50 PM  CBC  WBC 4.0 - 10.5 K/uL 4.6  5.0    Hemoglobin 12.0 - 15.0 g/dL 9.1  8.9  8.8   Hematocrit 36.0 - 46.0 % 28.7  28.5  28.2   Platelets 150 - 400 K/uL 166  173        CMP     Component Value Date/Time   NA 140 01/07/2022 1146   K 3.7 01/07/2022 1146   K 3.8 06/06/2012 1632   CL 106 01/07/2022 1146   CO2 26 01/07/2022 1146   GLUCOSE 109 (H) 01/07/2022 1146   BUN 27 (H) 01/07/2022 1146   CREATININE 1.75 (H) 01/07/2022 1146   CALCIUM 9.1 01/07/2022 1146   PROT 7.2 11/16/2021 0430   ALBUMIN 3.7 11/16/2021 0430   AST 18 11/16/2021 0430   ALT 11 11/16/2021 0430   ALKPHOS 47 11/16/2021 0430   BILITOT 0.6 11/16/2021 0430   GFRNONAA 29 (L) 01/07/2022 1146   GFRAA 37 (L) 01/23/2019 1100  No results found.     Assessment & Plan:   1. Gastrointestinal hemorrhage, unspecified gastrointestinal hemorrhage type The patient has not had any further worsening incidents.  We will send the patient for a hemoglobin blood draw in order to determine the levels and possibly move to taking Plavix daily.  2. Femoral artery occlusion, left [facility]) Patient continues to maintain patency postintervention.  The patient return in approximately 8 weeks with ABI.   Current Outpatient Medications on File Prior to Visit  Medication Sig Dispense Refill   acetaminophen (TYLENOL) 325 MG tablet Take 2 tablets (650 mg total) by mouth every 6 (six) hours as needed for mild pain or fever.     amitriptyline (ELAVIL) 25 MG tablet Take 25 mg by mouth at bedtime.     cholecalciferol (VITAMIN D3) 25 MCG (1000 UT) tablet Take 1,000 Units by mouth daily.     clopidogrel (PLAVIX) 75 MG tablet Take 1 tablet (75 mg total) by mouth daily. 30 tablet 6   cyanocobalamin (VITAMIN B12) 1000 MCG tablet Take 1,000 mcg by mouth.     Ferrous Sulfate (IRON) 325 (65 Fe) MG TABS Take 1 tablet by mouth daily.     furosemide (LASIX) 40 MG tablet Take 40 mg by mouth daily. For fluid retention (swollen ankles)     loratadine (CLARITIN) 10 MG tablet Take 10 mg by mouth daily as needed.     pantoprazole (PROTONIX) 40 MG tablet Take 40 mg by mouth every morning.     predniSONE (DELTASONE) 1 MG tablet Take 2 mg by mouth daily.     pregabalin (LYRICA) 75 MG capsule Take 1 capsule (75 mg total) by mouth daily. 30 capsule 0   rosuvastatin (CRESTOR) 5 MG tablet Take 5 mg by mouth daily.     sertraline (ZOLOFT) 50 MG tablet Take 25 mg by mouth daily.  0   metoprolol succinate (TOPROL-XL) 25 MG 24 hr tablet Take 12.5 mg by mouth daily. (Patient not taking: Reported on 12/23/2021)     No current facility-administered medications on file prior to visit.    There are no Patient Instructions on file for this visit. No follow-ups on  file.   Kris Hartmann, NP

## 2022-01-19 DIAGNOSIS — D62 Acute posthemorrhagic anemia: Secondary | ICD-10-CM | POA: Diagnosis not present

## 2022-01-19 DIAGNOSIS — K922 Gastrointestinal hemorrhage, unspecified: Secondary | ICD-10-CM | POA: Diagnosis not present

## 2022-01-19 DIAGNOSIS — I5032 Chronic diastolic (congestive) heart failure: Secondary | ICD-10-CM | POA: Diagnosis not present

## 2022-01-19 DIAGNOSIS — Z48812 Encounter for surgical aftercare following surgery on the circulatory system: Secondary | ICD-10-CM | POA: Diagnosis not present

## 2022-01-19 DIAGNOSIS — I130 Hypertensive heart and chronic kidney disease with heart failure and stage 1 through stage 4 chronic kidney disease, or unspecified chronic kidney disease: Secondary | ICD-10-CM | POA: Diagnosis not present

## 2022-01-26 ENCOUNTER — Other Ambulatory Visit: Payer: Self-pay | Admitting: Family Medicine

## 2022-01-26 DIAGNOSIS — I4891 Unspecified atrial fibrillation: Secondary | ICD-10-CM | POA: Diagnosis not present

## 2022-01-26 DIAGNOSIS — R600 Localized edema: Secondary | ICD-10-CM | POA: Diagnosis not present

## 2022-01-26 DIAGNOSIS — D649 Anemia, unspecified: Secondary | ICD-10-CM | POA: Diagnosis not present

## 2022-01-26 DIAGNOSIS — Z79899 Other long term (current) drug therapy: Secondary | ICD-10-CM | POA: Diagnosis not present

## 2022-01-27 ENCOUNTER — Inpatient Hospital Stay
Admission: EM | Admit: 2022-01-27 | Discharge: 2022-01-29 | DRG: 812 | Disposition: A | Discharge status: Home / Self Care | Payer: Medicare Other | Attending: Internal Medicine | Admitting: Internal Medicine

## 2022-01-27 ENCOUNTER — Other Ambulatory Visit: Payer: Self-pay

## 2022-01-27 DIAGNOSIS — F32A Depression, unspecified: Secondary | ICD-10-CM | POA: Diagnosis not present

## 2022-01-27 DIAGNOSIS — Z8249 Family history of ischemic heart disease and other diseases of the circulatory system: Secondary | ICD-10-CM

## 2022-01-27 DIAGNOSIS — M316 Other giant cell arteritis: Secondary | ICD-10-CM | POA: Diagnosis not present

## 2022-01-27 DIAGNOSIS — D500 Iron deficiency anemia secondary to blood loss (chronic): Secondary | ICD-10-CM | POA: Diagnosis not present

## 2022-01-27 DIAGNOSIS — D509 Iron deficiency anemia, unspecified: Secondary | ICD-10-CM | POA: Diagnosis not present

## 2022-01-27 DIAGNOSIS — D62 Acute posthemorrhagic anemia: Principal | ICD-10-CM | POA: Diagnosis present

## 2022-01-27 DIAGNOSIS — D649 Anemia, unspecified: Secondary | ICD-10-CM | POA: Diagnosis not present

## 2022-01-27 DIAGNOSIS — Z7902 Long term (current) use of antithrombotics/antiplatelets: Secondary | ICD-10-CM | POA: Diagnosis not present

## 2022-01-27 DIAGNOSIS — E876 Hypokalemia: Secondary | ICD-10-CM | POA: Diagnosis present

## 2022-01-27 DIAGNOSIS — Z885 Allergy status to narcotic agent status: Secondary | ICD-10-CM

## 2022-01-27 DIAGNOSIS — K449 Diaphragmatic hernia without obstruction or gangrene: Secondary | ICD-10-CM | POA: Diagnosis not present

## 2022-01-27 DIAGNOSIS — M810 Age-related osteoporosis without current pathological fracture: Secondary | ICD-10-CM | POA: Diagnosis not present

## 2022-01-27 DIAGNOSIS — Z86718 Personal history of other venous thrombosis and embolism: Secondary | ICD-10-CM

## 2022-01-27 DIAGNOSIS — Z888 Allergy status to other drugs, medicaments and biological substances status: Secondary | ICD-10-CM

## 2022-01-27 DIAGNOSIS — E785 Hyperlipidemia, unspecified: Secondary | ICD-10-CM | POA: Diagnosis not present

## 2022-01-27 DIAGNOSIS — Z79899 Other long term (current) drug therapy: Secondary | ICD-10-CM | POA: Diagnosis not present

## 2022-01-27 DIAGNOSIS — Z720 Tobacco use: Secondary | ICD-10-CM

## 2022-01-27 DIAGNOSIS — K922 Gastrointestinal hemorrhage, unspecified: Secondary | ICD-10-CM | POA: Diagnosis not present

## 2022-01-27 DIAGNOSIS — I739 Peripheral vascular disease, unspecified: Secondary | ICD-10-CM | POA: Diagnosis not present

## 2022-01-27 DIAGNOSIS — I10 Essential (primary) hypertension: Secondary | ICD-10-CM | POA: Diagnosis not present

## 2022-01-27 DIAGNOSIS — Z880 Allergy status to penicillin: Secondary | ICD-10-CM

## 2022-01-27 DIAGNOSIS — I130 Hypertensive heart and chronic kidney disease with heart failure and stage 1 through stage 4 chronic kidney disease, or unspecified chronic kidney disease: Secondary | ICD-10-CM | POA: Diagnosis not present

## 2022-01-27 DIAGNOSIS — K311 Adult hypertrophic pyloric stenosis: Secondary | ICD-10-CM | POA: Diagnosis present

## 2022-01-27 DIAGNOSIS — I5032 Chronic diastolic (congestive) heart failure: Secondary | ICD-10-CM | POA: Diagnosis present

## 2022-01-27 DIAGNOSIS — I482 Chronic atrial fibrillation, unspecified: Secondary | ICD-10-CM | POA: Diagnosis present

## 2022-01-27 DIAGNOSIS — N1832 Chronic kidney disease, stage 3b: Secondary | ICD-10-CM | POA: Diagnosis not present

## 2022-01-27 LAB — CBC WITH DIFFERENTIAL/PLATELET
Abs Immature Granulocytes: 0.02 10*3/uL (ref 0.00–0.07)
Basophils Absolute: 0 10*3/uL (ref 0.0–0.1)
Basophils Relative: 1 %
Eosinophils Absolute: 0.1 10*3/uL (ref 0.0–0.5)
Eosinophils Relative: 3 %
HCT: 20.2 % — ABNORMAL LOW (ref 36.0–46.0)
Hemoglobin: 6.2 g/dL — ABNORMAL LOW (ref 12.0–15.0)
Immature Granulocytes: 1 %
Lymphocytes Relative: 26 %
Lymphs Abs: 1.1 10*3/uL (ref 0.7–4.0)
MCH: 30.5 pg (ref 26.0–34.0)
MCHC: 30.7 g/dL (ref 30.0–36.0)
MCV: 99.5 fL (ref 80.0–100.0)
Monocytes Absolute: 0.5 10*3/uL (ref 0.1–1.0)
Monocytes Relative: 10 %
Neutro Abs: 2.6 10*3/uL (ref 1.7–7.7)
Neutrophils Relative %: 59 %
Platelets: 228 10*3/uL (ref 150–400)
RBC: 2.03 MIL/uL — ABNORMAL LOW (ref 3.87–5.11)
RDW: 14.9 % (ref 11.5–15.5)
WBC: 4.3 10*3/uL (ref 4.0–10.5)
nRBC: 0 % (ref 0.0–0.2)

## 2022-01-27 LAB — COMPREHENSIVE METABOLIC PANEL
ALT: 8 U/L (ref 0–44)
AST: 22 U/L (ref 15–41)
Albumin: 3.7 g/dL (ref 3.5–5.0)
Alkaline Phosphatase: 44 U/L (ref 38–126)
Anion gap: 9 (ref 5–15)
BUN: 35 mg/dL — ABNORMAL HIGH (ref 8–23)
CO2: 29 mmol/L (ref 22–32)
Calcium: 9.5 mg/dL (ref 8.9–10.3)
Chloride: 103 mmol/L (ref 98–111)
Creatinine, Ser: 1.7 mg/dL — ABNORMAL HIGH (ref 0.44–1.00)
GFR, Estimated: 30 mL/min — ABNORMAL LOW (ref >60)
Glucose, Bld: 108 mg/dL — ABNORMAL HIGH (ref 70–99)
Potassium: 2.9 mmol/L — ABNORMAL LOW (ref 3.5–5.1)
Sodium: 141 mmol/L (ref 135–145)
Total Bilirubin: 0.7 mg/dL (ref 0.3–1.2)
Total Protein: 7 g/dL (ref 6.5–8.1)

## 2022-01-27 LAB — CBC
HCT: 21.6 % — ABNORMAL LOW (ref 36.0–46.0)
Hemoglobin: 6.9 g/dL — ABNORMAL LOW (ref 12.0–15.0)
MCH: 30.7 pg (ref 26.0–34.0)
MCHC: 31.9 g/dL (ref 30.0–36.0)
MCV: 96 fL (ref 80.0–100.0)
Platelets: 204 10*3/uL (ref 150–400)
RBC: 2.25 MIL/uL — ABNORMAL LOW (ref 3.87–5.11)
RDW: 15.5 % (ref 11.5–15.5)
WBC: 5 10*3/uL (ref 4.0–10.5)
nRBC: 0 % (ref 0.0–0.2)

## 2022-01-27 LAB — BRAIN NATRIURETIC PEPTIDE: B Natriuretic Peptide: 260.3 pg/mL — ABNORMAL HIGH (ref 0.0–100.0)

## 2022-01-27 LAB — PREPARE RBC (CROSSMATCH)

## 2022-01-27 LAB — APTT: aPTT: 36 seconds (ref 24–36)

## 2022-01-27 LAB — PROTIME-INR
INR: 1.1 (ref 0.8–1.2)
Prothrombin Time: 14.4 seconds (ref 11.4–15.2)

## 2022-01-27 LAB — MAGNESIUM: Magnesium: 2.1 mg/dL (ref 1.7–2.4)

## 2022-01-27 MED ORDER — POTASSIUM CHLORIDE CRYS ER 20 MEQ PO TBCR
40.0000 meq | EXTENDED_RELEASE_TABLET | Freq: Once | ORAL | Status: AC
  Administered 2022-01-27: 40 meq via ORAL
  Filled 2022-01-27: qty 2

## 2022-01-27 MED ORDER — POTASSIUM CHLORIDE 10 MEQ/100ML IV SOLN
10.0000 meq | INTRAVENOUS | Status: DC
  Filled 2022-01-27: qty 100

## 2022-01-27 MED ORDER — FERROUS SULFATE 325 (65 FE) MG PO TABS
325.0000 mg | ORAL_TABLET | Freq: Every day | ORAL | Status: DC
  Administered 2022-01-28 – 2022-01-29 (×2): 325 mg via ORAL
  Filled 2022-01-27 (×2): qty 1

## 2022-01-27 MED ORDER — SODIUM CHLORIDE 0.9 % IV SOLN: INTRAVENOUS | Status: DC

## 2022-01-27 MED ORDER — POLYETHYLENE GLYCOL 3350 17 GM/SCOOP PO POWD
0.5000 | Freq: Once | ORAL | Status: AC
  Administered 2022-01-27: 127.5 g via ORAL
  Filled 2022-01-27: qty 255

## 2022-01-27 MED ORDER — SERTRALINE HCL 50 MG PO TABS
25.0000 mg | ORAL_TABLET | Freq: Every day | ORAL | Status: DC
  Administered 2022-01-28 – 2022-01-29 (×2): 25 mg via ORAL
  Filled 2022-01-27 (×2): qty 1

## 2022-01-27 MED ORDER — FUROSEMIDE 10 MG/ML IJ SOLN: 20.0000 mg | Freq: Once | INTRAMUSCULAR | Status: DC

## 2022-01-27 MED ORDER — SODIUM CHLORIDE 0.9 % IV SOLN
10.0000 mL/h | Freq: Once | INTRAVENOUS | Status: AC
  Administered 2022-01-27: 10 mL/h via INTRAVENOUS

## 2022-01-27 MED ORDER — FUROSEMIDE 40 MG PO TABS
40.0000 mg | ORAL_TABLET | Freq: Every day | ORAL | Status: DC
  Administered 2022-01-28 – 2022-01-29 (×2): 40 mg via ORAL
  Filled 2022-01-27 (×2): qty 1

## 2022-01-27 MED ORDER — PREDNISONE 1 MG PO TABS
1.0000 mg | ORAL_TABLET | Freq: Every day | ORAL | Status: DC
  Administered 2022-01-27 – 2022-01-29 (×3): 1 mg via ORAL
  Filled 2022-01-27 (×3): qty 1

## 2022-01-27 MED ORDER — AMITRIPTYLINE HCL 25 MG PO TABS: 25.0000 mg | ORAL_TABLET | Freq: Every evening | ORAL | Status: DC | PRN

## 2022-01-27 MED ORDER — SODIUM CHLORIDE 0.9% IV SOLUTION: Freq: Once | INTRAVENOUS | Status: AC

## 2022-01-27 MED ORDER — FUROSEMIDE 10 MG/ML IJ SOLN
40.0000 mg | Freq: Once | INTRAMUSCULAR | Status: DC
  Filled 2022-01-27: qty 4

## 2022-01-27 MED ORDER — FUROSEMIDE 40 MG PO TABS: 40.0000 mg | ORAL_TABLET | Freq: Every day | ORAL | Status: DC

## 2022-01-27 MED ORDER — ROSUVASTATIN CALCIUM 5 MG PO TABS
5.0000 mg | ORAL_TABLET | Freq: Every day | ORAL | Status: DC
  Administered 2022-01-27 – 2022-01-28 (×2): 5 mg via ORAL
  Filled 2022-01-27 (×2): qty 1

## 2022-01-27 MED ORDER — VITAMIN B-12 1000 MCG PO TABS
1000.0000 ug | ORAL_TABLET | Freq: Every day | ORAL | Status: DC
  Administered 2022-01-28 – 2022-01-29 (×2): 1000 ug via ORAL
  Filled 2022-01-27 (×2): qty 1

## 2022-01-27 MED ORDER — VITAMIN D 25 MCG (1000 UNIT) PO TABS
1000.0000 [IU] | ORAL_TABLET | Freq: Every day | ORAL | Status: DC
  Administered 2022-01-28 – 2022-01-29 (×2): 1000 [IU] via ORAL
  Filled 2022-01-27 (×2): qty 1

## 2022-01-27 MED ORDER — PREGABALIN 75 MG PO CAPS
75.0000 mg | ORAL_CAPSULE | Freq: Every day | ORAL | Status: DC
  Administered 2022-01-28 – 2022-01-29 (×2): 75 mg via ORAL
  Filled 2022-01-27 (×2): qty 1

## 2022-01-27 MED ORDER — PANTOPRAZOLE SODIUM 40 MG IV SOLR
40.0000 mg | Freq: Two times a day (BID) | INTRAVENOUS | Status: DC
  Administered 2022-01-27 – 2022-01-28 (×4): 40 mg via INTRAVENOUS
  Filled 2022-01-27 (×5): qty 10

## 2022-01-27 MED ORDER — PANTOPRAZOLE 80MG IVPB - SIMPLE MED
80.0000 mg | Freq: Once | INTRAVENOUS | Status: DC
  Filled 2022-01-27: qty 100

## 2022-01-27 MED ORDER — LORATADINE 10 MG PO TABS: 10.0000 mg | ORAL_TABLET | Freq: Every day | ORAL | Status: DC | PRN

## 2022-01-27 NOTE — ED Triage Notes (Addendum)
Pt was sent by PCP due to a low hemoglobin. Hgb yesterday was 6.9 and pt has a history of blood transfusion and stomach bleed. Pt also noted to have swelling to bilateral lower extremities.  Pt denies being on blood thinners and states weakness after walking

## 2022-01-27 NOTE — ED Notes (Signed)
Meal tray at bedside.  

## 2022-01-27 NOTE — ED Provider Notes (Signed)
[facility] Provider Note    Event Date/Time   First MD Initiated Contact with Patient 01/27/22 1133     (approximate)   History   No chief complaint on file.   HPI  Rebecca Gilmore is a 81 y.o. 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  The patient otherwise has been feeling at her baseline without increased lightheadedness, dizziness, shortness of breath, and she denies bleeding from her rectum.  Denies bright red blood or melena.  Denies hemoptysis, vaginal bleeding, hematemesis.  She states otherwise she has been feeling at her baseline and no recent infectious symptoms either.  He has been taking all of her medications as prescribed and has no longer been taking Eliquis and takes Plavix every other day, no Plavix today.  Reviewed external medical notes including EGD with findings: October 2023 - EGD Localized severe inflammation characterized by erosions, erythema, friability and granularity was found in the gastric antrum. Biopsies were taken with a cold forceps for histology. Biopsies were taken with a cold forceps for Helicobacter pylori testing. Estimated blood loss was minimal.  Also normal colonoscopy in October 2023  History was obtained via the patient and a review of external medical notes as above.      Physical Exam   Triage Vital Signs: ED Triage Vitals  Enc Vitals Group     BP 01/27/22 1028 (!) 131/51     Pulse Rate 01/27/22 1028 (!) 58     Resp 01/27/22 1028 20     Temp 01/27/22 1028 (!) 97.3 F (36.3 C)     Temp Source 01/27/22 1028 Oral     SpO2 01/27/22 1028 93 %     Weight 01/27/22 1023 237 lb (107.5 kg)     Height 01/27/22  1023 _0  (1.6 m)     Head Circumference --      Peak Flow --      Pain Score 01/27/22 1022 0     Pain Loc --      Pain Edu? --      Excl. in [place]? --     Most recent vital signs: Vitals:   01/27/22 1028  BP: (!) 131/51  Pulse: (!) 58  Resp: 20  Temp: (!) 97.3 F (36.3 C)  SpO2: 93%    General: Awake, no distress.  CV:  Pale conjunctiva Resp:  Normal effort.  Abd:  No distention.  Very mild epigastric tenderness without rigidity or guarding Other:  Alert oriented and pleasant.  Rectal exam shows yellow stool guiac +, no frank blood or melena.   ED Results / Procedures / Treatments   Labs (all labs ordered are listed, but only abnormal results are displayed) Labs Reviewed  COMPREHENSIVE METABOLIC PANEL - Abnormal; Notable for the following components:      Result Value   Potassium 2.9 (*)    Glucose, Bld 108 (*)    BUN 35 (*)    Creatinine, Ser 1.70 (*)    GFR, Estimated 30 (*)    All other components within normal limits  CBC WITH DIFFERENTIAL/PLATELET - Abnormal; Notable for the following components:   RBC 2.03 (*)    Hemoglobin 6.2 (*)    HCT  20.2 (*)    All other components within normal limits  TYPE AND SCREEN  PREPARE RBC (CROSSMATCH)     I reviewed labs and they are notable for hypokalemia with a potassium of 2.9 and anemia with hemoglobin of 6.2, creatinine is at baseline 1.70   PROCEDURES:  Critical Care performed: No  Procedures   MEDICATIONS ORDERED IN ED: Medications  potassium chloride 10 mEq in 100 mL IVPB (has no administration in time range)  potassium chloride SA (KLOR-CON M) CR tablet 40 mEq (has no administration in time range)  0.9 %  sodium chloride infusion (has no administration in time range)  pantoprazole (PROTONIX) 80 mg /NS 100 mL IVPB (has no administration in time range)    Consultants:  I spoke with GI consultant Dr. Marius Ditch regarding care plan for this patient.   IMPRESSION / MDM / ASSESSMENT AND PLAN / ED COURSE  I  reviewed the triage vital signs and the nursing notes.                              Differential diagnosis includes, but is not limited to, GI bleeding blood loss anemia, upper GI bleeding, gastritis or gastric ulcer, iron deficiency anemia   The patient is on the cardiac monitor to evaluate for evidence of arrhythmia and/or significant heart rate changes.  MDM: Patient with anemia with a hemoglobin of 6 requiring blood transfusion though she states no significant symptoms of anemia, no shortness of breath or lightheadedness, denies any source of bleeding.  Her rectal exam was negative for frank blood or melena and she did have an EGD and colonoscopy performed within the last month which showed some friability in the gastric antrum only.  She is no longer on anticoagulation.  She is more dynamically stable.  She will get a unit of blood in the emergency department.  I spoke with Dr. Marius Ditch at urology about this patient and reviewed labs and presentation.  Her BUN has increased from normal to 35 today over the past 1 month, and hemoglobin decreased 3 points since being home, no frank bleeding on my exam today and hemodynamically stable but will benefit from admission and pill endoscopy tomorrow.   Patient's presentation is most consistent with acute presentation with potential threat to life or bodily function.       FINAL CLINICAL IMPRESSION(S) / ED DIAGNOSES   Final diagnoses:  Anemia, unspecified type     Rx / DC Orders   ED Discharge Orders     None        Note:  This document was prepared using Dragon voice recognition software and may include unintentional dictation errors.    Lucillie Garfinkel, MD 01/27/22 1220

## 2022-01-27 NOTE — Consult Note (Signed)
Cephas Darby, MD [address]  [place]  [place], [place] [address]  Main: [phone]  Fax: [phone] Pager: [phone]   Consultation  Referring Provider:     No ref. provider found Primary Care Physician:  Philmore Pali, NP (Inactive) Primary Gastroenterologist:  Dr. Virgina Jock       Reason for Consultation: Acute on chronic iron deficiency anemia  Date of Admission:  01/27/2022 Date of Consultation:  01/27/2022         HPI:   Rebecca Gilmore is a 81 y.o. 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  Patient went to see her PCP yesterday, was found to have hemoglobin of 6.9 along with swelling of bilateral lower extremities.  Therefore, she was advised to go to the emergency room.  Patient was admitted to [facility] from 10/5 to 10/9 due to severe iron deficiency anemia and she responded appropriately to blood transfusions.  She underwent upper endoscopy as well as colonoscopy which were unremarkable Subsequently, we do capsule endoscopy was inconclusive.  Patient reports that she has been taking oral iron as outpatient.  She is also taking Plavix every other day.  She is taking Protonix 40 mg daily.  Patient's repeat hemoglobin in the ER was 6.2 which dropped from 9.1 since last admission.  She reports having brown stool.  GI is counseled to go to acute on chronic anemia.  NSAIDs: None  Antiplts/Anticoagulants/Anti thrombotics: Plavix, last dose yesterday  GI Procedures:  EDG on 10/6: - Normal duodenal bulb, first portion of the duodenum and second portion of the duodenum. - Z-line regular. - Esophagogastric landmarks identified. - 2 cm hiatal hernia. - Gastritis. Biopsied. - Gastric stenosis was found at the pylorus.   Colonoscopy on 10/7: - Preparation of the colon was fair. - The entire  examined colon is normal on direct and retroflexion views. - No specimens collected.  Past Medical History:  Diagnosis Date   Depression    HBP (high blood pressure)    History of bladder problems    Memory loss    Muscle pain    Osteoporosis    Reflux    Sinus congestion    Swelling    B/L FEET AND LEGS    Past Surgical History:  Procedure Laterality Date   ARTERY BIOPSY Bilateral 12/01/2016   Procedure: BIOPSY TEMPORAL ARTERY;  Surgeon: Conrad Haw River, MD;  Location: [facility];  Service: Vascular;  Laterality: Bilateral;   CATARACT SURGERY     COLONOSCOPY WITH PROPOFOL N/A 12/13/2021   Procedure: COLONOSCOPY WITH PROPOFOL;  Surgeon: Jonathon Bellows, MD;  Location[facility] ENDOSCOPY;  Service: Gastroenterology;  Laterality: N/A;   ESOPHAGOGASTRODUODENOSCOPY (EGD) WITH PROPOFOL N/A 12/12/2021   Procedure: ESOPHAGOGASTRODUODENOSCOPY (EGD) WITH PROPOFOL;  Surgeon: [name], DO;  Location: [facility] ENDOSCOPY;  Service: Gastroenterology;  Laterality: N/A;   EYELID SURGERY Bilateral    GIVENS CAPSULE STUDY N/A 12/15/2021   Procedure: GIVENS CAPSULE STUDY;  Surgeon: [name], DO;  Location: [facility] ENDOSCOPY;  Service: Gastroenterology;  Laterality: N/A;   GROWTH ON FACE     KNEE SURGERY Right    THROMBECTOMY FEMORAL ARTERY Left 11/16/2021   Procedure: THROMBECTOMY FEMORAL ARTERY;  Surgeon: Conrad Espino, MD;  Location: [facility] ORS;  Service: Vascular;  Laterality: Left;     Current Facility-Administered Medications:    amitriptyline (  ELAVIL) tablet 25 mg, 25 mg, Oral, QHS PRN, Ivor Costa, MD   [START ON 01/28/2022] cholecalciferol (VITAMIN D3) 25 MCG (1000 UNIT) tablet 1,000 Units, 1,000 Units, Oral, Daily, Ivor Costa, MD   [START ON 01/28/2022] cyanocobalamin (VITAMIN B12) tablet 1,000 mcg, 1,000 mcg, Oral, Daily, Ivor Costa, MD   Derrill Memo ON 01/28/2022] ferrous sulfate tablet 325 mg, 325 mg, Oral, Daily, Ivor Costa, MD   furosemide (LASIX) injection 40 mg, 40 mg, Intravenous, Once,  Ivor Costa, MD   Derrill Memo ON 01/28/2022] furosemide (LASIX) tablet 40 mg, 40 mg, Oral, Daily, Ivor Costa, MD   loratadine (CLARITIN) tablet 10 mg, 10 mg, Oral, Daily PRN, Ivor Costa, MD   pantoprazole (PROTONIX) injection 40 mg, 40 mg, Intravenous, Q12H, Ivor Costa, MD, 40 mg at 01/27/22 1437   polyethylene glycol powder (GLYCOLAX/MIRALAX) container 127.5 g, 0.5 Container, Oral, Once, Fiona Coto, Tally Due, MD   predniSONE (DELTASONE) tablet 1 mg, 1 mg, Oral, Daily, Ivor Costa, MD, 1 mg at 01/27/22 1451   [START ON 01/28/2022] pregabalin (LYRICA) capsule 75 mg, 75 mg, Oral, Daily, Ivor Costa, MD   rosuvastatin (CRESTOR) tablet 5 mg, 5 mg, Oral, QHS, Ivor Costa, MD   [START ON 01/28/2022] sertraline (ZOLOFT) tablet 25 mg, 25 mg, Oral, Daily, Ivor Costa, MD  Current Outpatient Medications:    cholecalciferol (VITAMIN D3) 25 MCG (1000 UT) tablet, Take 1,000 Units by mouth daily., Disp: , Rfl:    clopidogrel (PLAVIX) 75 MG tablet, Take 1 tablet (75 mg total) by mouth daily., Disp: 30 tablet, Rfl: 6   cyanocobalamin (VITAMIN B12) 1000 MCG tablet, Take 1,000 mcg by mouth., Disp: , Rfl:    Ferrous Sulfate (IRON) 325 (65 Fe) MG TABS, Take 1 tablet by mouth daily., Disp: , Rfl:    furosemide (LASIX) 40 MG tablet, Take 40 mg by mouth daily. For fluid retention (swollen ankles), Disp: , Rfl:    pantoprazole (PROTONIX) 40 MG tablet, Take 40 mg by mouth every morning., Disp: , Rfl:    predniSONE (DELTASONE) 1 MG tablet, Take 1 mg by mouth daily., Disp: , Rfl:    pregabalin (LYRICA) 75 MG capsule, Take 1 capsule (75 mg total) by mouth daily., Disp: 30 capsule, Rfl: 0   rosuvastatin (CRESTOR) 5 MG tablet, Take 5 mg by mouth daily., Disp: , Rfl:    sertraline (ZOLOFT) 50 MG tablet, Take 25 mg by mouth daily., Disp: , Rfl: 0   torsemide (DEMADEX) 100 MG tablet, Take 100 mg by mouth daily., Disp: , Rfl:    acetaminophen (TYLENOL) 325 MG tablet, Take 2 tablets (650 mg total) by mouth every 6 (six) hours as needed for  mild pain or fever., Disp: , Rfl:    amitriptyline (ELAVIL) 25 MG tablet, Take 25 mg by mouth at bedtime., Disp: , Rfl:    loratadine (CLARITIN) 10 MG tablet, Take 10 mg by mouth daily as needed., Disp: , Rfl:    metoprolol succinate (TOPROL-XL) 25 MG 24 hr tablet, Take 12.5 mg by mouth daily. (Patient not taking: Reported on 12/23/2021), Disp: , Rfl:    Family History  Problem Relation Age of Onset   Heart disease Mother    AAA (abdominal aortic aneurysm) Mother    Heart disease Father      Social History   Tobacco Use   Smoking status: Former    Types: Cigarettes    Quit date: 01/12/1993    Years since quitting: 29.0   Smokeless tobacco: Former    Types: Snuff, Loss adjuster, chartered  Tobacco comments:    DATE QUIT UNKNOWN  Vaping Use   Vaping Use: Never used  Substance Use Topics   Alcohol use: No   Drug use: No    Allergies as of 01/27/2022 - Review Complete 01/27/2022  Allergen Reaction Noted   Morphine Hives and Itching 01/08/2022   Morphine and related Itching 11/22/2021   Kenalog [triamcinolone acetonide] Other (See Comments) 12/12/2012   Penicillins Other (See Comments) 12/06/2012    Review of Systems:    All systems reviewed and negative except where noted in HPI.   Physical Exam:  Vital signs in last 24 hours: Temp:  [97.3 F (36.3 C)-97.7 F (36.5 C)] 97.7 F (36.5 C) (11/21 1434) Pulse Rate:  [55-59] 57 (11/21 1522) Resp:  [14-20] 14 (11/21 1522) BP: (112-131)/(32-51) 115/41 (11/21 1522) SpO2:  [93 %-100 %] 100 % (11/21 1522) Weight:  [107.5 kg] 107.5 kg (11/21 1023)   General:   Pleasant, cooperative in NAD Head:  Normocephalic and atraumatic. Eyes:   No icterus.   Conjunctiva pale. PERRLA. Ears:  Normal auditory acuity. Neck:  Supple; no masses or thyroidomegaly Lungs: Respirations even and unlabored. Lungs clear to auscultation bilaterally.   No wheezes, crackles, or rhonchi.  Heart:  Regular rate and rhythm;  Without murmur, clicks, rubs or gallops Abdomen:   Soft, nondistended, nontender. Normal bowel sounds. No appreciable masses or hepatomegaly.  No rebound or guarding.  Rectal:  Not performed. Msk:  Symmetrical without gross deformities.  Strength generalized weakness Extremities:  Without edema, cyanosis or clubbing. Neurologic:  Alert and oriented x3;  grossly normal neurologically. Skin:  Intact without significant lesions or rashes. Psych:  Alert and cooperative. Normal affect.  LAB RESULTS:    Latest Ref Rng & Units 01/27/2022   10:27 AM 12/15/2021    5:34 AM 12/14/2021    7:02 AM  CBC  WBC 4.0 - 10.5 K/uL 4.3  4.6  5.0   Hemoglobin 12.0 - 15.0 g/dL 6.2  9.1  8.9   Hematocrit 36.0 - 46.0 % 20.2  28.7  28.5   Platelets 150 - 400 K/uL 228  166  173     BMET    Latest Ref Rng & Units 01/27/2022   10:27 AM 01/07/2022   11:46 AM 12/15/2021    5:34 AM  BMP  Glucose 70 - 99 mg/dL 108  109  92   BUN 8 - 23 mg/dL 35  27  18   Creatinine 0.44 - 1.00 mg/dL 1.70  1.75  1.39   Sodium 135 - 145 mmol/L 141  140  141   Potassium 3.5 - 5.1 mmol/L 2.9  3.7  4.1   Chloride 98 - 111 mmol/L 103  106  110   CO2 22 - 32 mmol/L _0 Calcium 8.9 - 10.3 mg/dL 9.5  9.1  9.0     LFT    Latest Ref Rng & Units 01/27/2022   10:27 AM 11/16/2021    4:30 AM 01/30/2020   10:51 AM  Hepatic Function  Total Protein 6.5 - 8.1 g/dL 7.0  7.2  7.1   Albumin 3.5 - 5.0 g/dL 3.7  3.7  3.9   AST 15 - 41 U/L _1 ALT 0 - 44 U/L _2 Alk Phosphatase 38 - 126 U/L 44  47  39   Total Bilirubin 0.3 - 1.2 mg/dL 0.7  0.6  0.9  STUDIES: No results found.    Impression / Plan:   Icis Budreau is a 81 y.o. female with history of peripheral artery disease s/p thrombectomy, A-fib, previously on Eliquis which has been discontinued due to severe iron deficiency anemia of unclear etiology, on Plavix every other day is admitted with acute on chronic iron deficiency anemia with no evidence of active GI bleed.    Acute on chronic iron  deficiency anemia Mildly elevated BUN/creatinine Recommend Protonix 40 mg IV twice daily Correct hypokalemia Okay with clear liquid diet Given that patient had upper endoscopy and colonoscopy about a month ago which were unremarkable and video capsule endoscopy was inconclusive.  Therefore, repeat video Capsule endoscopy as inpatient tomorrow Hold Plavix Half a gallon of MiraLAX prep ordered  I have discussed alternative options, risks & benefits,  which include, but are not limited to, bleeding, infection, perforation,respiratory complication & drug reaction.  The patient agrees with this plan & written consent will be obtained.     Thank you for involving me in the care of this patient.      LOS: 0 days   Sherri Sear, MD  01/27/2022, 3:43 PM    Note: This dictation was prepared with Dragon dictation along with smaller phrase technology. Any transcriptional errors that result from this process are unintentional.

## 2022-01-27 NOTE — ED Notes (Signed)
Multiple unsuccessful IV attempts by staff.

## 2022-01-27 NOTE — H&P (Addendum)
History and Physical    Rebecca Gilmore:[id number] DOB: 10/28/1940 DOA: 01/27/2022  Referring MD/NP/PA:   PCP: Philmore Pali, NP (Inactive)   Patient coming from:  The patient is coming from home.  At baseline, pt is independent for most of ADL.        Chief Complaint: low hgb  HPI: Rebecca Gilmore is a 81 y.o. female with medical history significant of hypertension, hyperlipidemia, GERD, depression, diastolic CHF, AJO-8N, atrial fibrillation and a DVT not on anticoagulants, PVD, iron deficiency anemia, recent admission due to GI bleeding, temporal arteritis on lowe dose of prednisone, who presents with low hemoglobin.  Patient was recently hospitalized from 10/5 - 10/9 due to GI bleeding.  Hemoglobin down to 4.9, required blood transfusion, hemoglobin improved to 9.1 at the discharge on 10/9.  Patient is no longer on anticoagulants.  Patient is taking Plavix every other day.  Last dose was yesterday. Pt presents to the emergency department after being called by her doctor for outpatient testing showing hemoglobin in the sixes.  Patient states that her stool has always been dark which she attributed to iron supplement use.  Patient denies chest pain, cough, shortness of breath.  No lightheadedness or dizziness.  Denies nausea, vomiting, diarrhea or abdominal pain.  No symptoms of UTI.   EDG on 10/6: - Normal duodenal bulb, first portion of the duodenum and second portion of the duodenum. - Z-line regular. - Esophagogastric landmarks identified. - 2 cm hiatal hernia. - Gastritis. Biopsied. - Gastric stenosis was found at the pylorus.  Colonoscopy on 10/7: - Preparation of the colon was fair. - The entire examined colon is normal on direct and retroflexion views. - No specimens collected.  Data reviewed independently and ED Course: pt was found to have hemoglobin 6.2 (9.1 on 12/15/2021), WBC 4.3, positive FOBT, BNP 260, renal function close to baseline, potassium 2.9, temperature 97.3,  blood pressure 131/51, heart rate of 58, RR 20, oxygen saturation 93% on room air.  Patient is admitted to telemetry bed as inpatient.  Dr. Marius Ditch of GI is consulted.   EKG: Not done in ED, will get one.      Review of Systems:   General: no fevers, chills, no body weight gain HEENT: no blurry vision, hearing changes or sore throat Respiratory: no dyspnea, coughing, wheezing CV: no chest pain, no palpitations GI: no nausea, vomiting, abdominal pain, diarrhea, constipation. Has dark stool GU: no dysuria, burning on urination, increased urinary frequency, hematuria  Ext: has leg edema Neuro: no unilateral weakness, numbness, or tingling, no vision change or hearing loss Skin: no rash, no skin tear. MSK: No muscle spasm, no deformity, no limitation of range of movement in spin Heme: No easy bruising.  Travel history: No recent long distant travel.   Allergy:  Allergies  Allergen Reactions   Morphine Hives and Itching    Other Reaction(s): Confusion   Morphine And Related Itching   Kenalog [Triamcinolone Acetonide] Other (See Comments)    FLUSH IN FACE   Penicillins Other (See Comments)    Has patient had a PCN reaction causing immediate rash, facial/tongue/throat swelling, SOB or lightheadedness with hypotension: Unknown Has patient had a PCN reaction causing severe rash involving mucus membranes or skin necrosis: Unknown Has patient had a PCN reaction that required hospitalization:No Has patient had a PCN reaction occurring within the last 10 years: No If all of the above answers are "NO", then may proceed with Cephalosporin use.     Past  Medical History:  Diagnosis Date   Depression    HBP (high blood pressure)    History of bladder problems    Memory loss    Muscle pain    Osteoporosis    Reflux    Sinus congestion    Swelling    B/L FEET AND LEGS    Past Surgical History:  Procedure Laterality Date   ARTERY BIOPSY Bilateral 12/01/2016   Procedure: BIOPSY  TEMPORAL ARTERY;  Surgeon: Conrad Franklin, MD;  Location: [facility];  Service: Vascular;  Laterality: Bilateral;   CATARACT SURGERY     COLONOSCOPY WITH PROPOFOL N/A 12/13/2021   Procedure: COLONOSCOPY WITH PROPOFOL;  Surgeon: Jonathon Bellows, MD;  [facility] ENDOSCOPY;  Service: Gastroenterology;  Laterality: N/A;   ESOPHAGOGASTRODUODENOSCOPY (EGD) WITH PROPOFOL N/A 12/12/2021   Procedure: ESOPHAGOGASTRODUODENOSCOPY (EGD) WITH PROPOFOL;  Surgeon: [name], DO;  Location: [facility] ENDOSCOPY;  Service: Gastroenterology;  Laterality: N/A;   EYELID SURGERY Bilateral    GIVENS CAPSULE STUDY N/A 12/15/2021   Procedure: GIVENS CAPSULE STUDY;  Surgeon: [name], DO;  Location: [facility] ENDOSCOPY;  Service: Gastroenterology;  Laterality: N/A;   GROWTH ON FACE     KNEE SURGERY Right    THROMBECTOMY FEMORAL ARTERY Left 11/16/2021   Procedure: THROMBECTOMY FEMORAL ARTERY;  Surgeon: Conrad Maury, MD;  Location: [facility] ORS;  Service: Vascular;  Laterality: Left;    Social History:  reports that she quit smoking about 29 years ago. Her smoking use included cigarettes. She has quit using smokeless tobacco.  Her smokeless tobacco use included snuff and chew. She reports that she does not drink alcohol and does not use drugs.  Family History:  Family History  Problem Relation Age of Onset   Heart disease Mother    AAA (abdominal aortic aneurysm) Mother    Heart disease Father      Prior to Admission medications   Medication Sig Start Date End Date Taking? Authorizing Provider  acetaminophen (TYLENOL) 325 MG tablet Take 2 tablets (650 mg total) by mouth every 6 (six) hours as needed for mild pain or fever. 12/15/21   Emeterio Reeve, DO  amitriptyline (ELAVIL) 25 MG tablet Take 25 mg by mouth at bedtime. 07/06/21   [provider]  cholecalciferol (VITAMIN D3) 25 MCG (1000 UT) tablet Take 1,000 Units by mouth daily.    [provider]  clopidogrel (PLAVIX) 75 MG tablet Take 1  tablet (75 mg total) by mouth daily. 12/23/21   Kris Hartmann, NP  cyanocobalamin (VITAMIN B12) 1000 MCG tablet Take 1,000 mcg by mouth.    [provider]  Ferrous Sulfate (IRON) 325 (65 Fe) MG TABS Take 1 tablet by mouth daily. 09/07/21   [provider]  furosemide (LASIX) 40 MG tablet Take 40 mg by mouth daily. For fluid retention (swollen ankles) 10/14/16   [provider]  loratadine (CLARITIN) 10 MG tablet Take 10 mg by mouth daily as needed.    [provider]  metoprolol succinate (TOPROL-XL) 25 MG 24 hr tablet Take 12.5 mg by mouth daily. Patient not taking: Reported on 12/23/2021 09/14/21   [provider]  pantoprazole (PROTONIX) 40 MG tablet Take 40 mg by mouth every morning. 10/01/21   [provider]  predniSONE (DELTASONE) 1 MG tablet Take 2 mg by mouth daily. 09/23/21   [provider]  pregabalin (LYRICA) 75 MG capsule Take 1 capsule (75 mg total) by mouth daily. 11/25/21   Kris Hartmann, NP  rosuvastatin (  CRESTOR) 5 MG tablet Take 5 mg by mouth daily. 04/06/19   [provider]  sertraline (ZOLOFT) 50 MG tablet Take 25 mg by mouth daily. 10/14/17   [provider]    Physical Exam: Vitals:   01/27/22 1234 01/27/22 1248 01/27/22 1352 01/27/22 1434  BP: (!) 114/37 (!) 116/32 (!) 112/37 (!) 128/45  Pulse: (!) 57 (!) 57 (!) 59 (!) 55  Resp: _0 Temp: 97.7 F (36.5 C) 97.7 F (36.5 C)  97.7 F (36.5 C)  TempSrc: Oral Oral    SpO2:  100% 100% 100%  Weight:      Height:       General: Not in acute distress. Pale looking HEENT:       Eyes: PERRL, EOMI, no scleral icterus.       ENT: No discharge from the ears and nose, no pharynx injection, no tonsillar enlargement.        Neck: No JVD, no bruit, no mass felt. Heme: No neck lymph node enlargement. Cardiac: S1/S2, RRR, No murmurs, No gallops or rubs. Respiratory: No rales, wheezing, rhonchi or rubs. GI: Soft, nondistended, nontender, no  rebound pain, no organomegaly, BS present. GU: No hematuria Ext: 2+ pitting leg edema bilaterally. 1+DP/PT pulse bilaterally. Musculoskeletal: No joint deformities, No joint redness or warmth, no limitation of ROM in spin. Skin: No rashes.  Neuro: Alert, oriented X3, cranial nerves II-XII grossly intact, moves all extremities normally.  Psych: Patient is not psychotic, no suicidal or hemocidal ideation.  Labs on Admission: I have personally reviewed following labs and imaging studies  CBC: Recent Labs  Lab 01/27/22 1027  WBC 4.3  NEUTROABS 2.6  HGB 6.2*  HCT 20.2*  MCV 99.5  PLT 856   Basic Metabolic Panel: Recent Labs  Lab 01/27/22 1027  NA 141  K 2.9*  CL 103  CO2 29  GLUCOSE 108*  BUN 35*  CREATININE 1.70*  CALCIUM 9.5  MG 2.1   GFR: Estimated Creatinine Clearance: 30.5 mL/min (A) (by C-G formula based on SCr of 1.7 mg/dL (H)). Liver Function Tests: Recent Labs  Lab 01/27/22 1027  AST 22  ALT 8  ALKPHOS 44  BILITOT 0.7  PROT 7.0  ALBUMIN 3.7   No results for input(s): "LIPASE", "AMYLASE" in the last 168 hours. No results for input(s): "AMMONIA" in the last 168 hours. Coagulation Profile: No results for input(s): "INR", "PROTIME" in the last 168 hours. Cardiac Enzymes: No results for input(s): "CKTOTAL", "CKMB", "CKMBINDEX", "TROPONINI" in the last 168 hours. BNP (last 3 results) No results for input(s): "PROBNP" in the last 8760 hours. HbA1C: No results for input(s): "HGBA1C" in the last 72 hours. CBG: No results for input(s): "GLUCAP" in the last 168 hours. Lipid Profile: No results for input(s): "CHOL", "HDL", "LDLCALC", "TRIG", "CHOLHDL", "LDLDIRECT" in the last 72 hours. Thyroid Function Tests: No results for input(s): "TSH", "T4TOTAL", "FREET4", "T3FREE", "THYROIDAB" in the last 72 hours. Anemia Panel: No results for input(s): "VITAMINB12", "FOLATE", "FERRITIN", "TIBC", "IRON", "RETICCTPCT" in the last 72 hours. Urine analysis:    Component  Value Date/Time   COLORURINE YELLOW (A) 11/19/2021 1223   APPEARANCEUR CLOUDY (A) 11/19/2021 1223   LABSPEC 1.015 11/19/2021 1223   PHURINE 5.0 11/19/2021 1223   GLUCOSEU NEGATIVE 11/19/2021 1223   HGBUR SMALL (A) 11/19/2021 1223   BILIRUBINUR NEGATIVE 11/19/2021 1223   KETONESUR NEGATIVE 11/19/2021 1223   PROTEINUR 30 (A) 11/19/2021 1223   NITRITE NEGATIVE 11/19/2021 1223   LEUKOCYTESUR LARGE (A)  11/19/2021 1223   Sepsis Labs: _0 (procalcitonin:4,lacticidven:4) )No results found for this or any previous visit (from the past 240 hour(s)).   Radiological Exams on Admission: No results found.    Assessment/Plan Principal Problem:   Acute blood loss anemia Active Problems:   GI bleeding   Iron deficiency anemia   Chronic diastolic CHF (congestive heart failure) (HCC)   Atrial fibrillation, chronic (HCC)   Chronic kidney disease, stage 3b (HCC)   Hypokalemia   HLD (hyperlipidemia)   Temporal arteritis (HCC)   Depression   Assessment and Plan:  Acute blood loss anemia and hx of iron deficiency anemia and GI bleeding: Hgb 9.1 --> 6.2.  Currently patient is asymptomatic.  Consulted Dr. Marius Ditch of GI, planning to do capsule endoscopy tomorrow.  - will admitted to tele bed as inpatient - transfuse 1 units of blood now - Start IV pantoprazole 40 mg bid - Zofran IV for nausea - Avoid NSAIDs and SQ heparin - Maintain IV access (2 large bore IVs if possible). - Monitor closely and follow q6h cbc, transfuse as necessary, if Hgb<7.0  -continue iron supplement - LaB: INR, PTT and type screen - Hold Plavix  Addendum: Hgb is 6.9 after giving 1unit of blood transfusion.  -will transfuse 1 more unit of blood.  Chronic diastolic CHF (congestive heart failure) ([place]): 2D echo on 11/17/2021 showed EF of 24-40% with diastolic dysfunction grade 2.  Patient has 2+ leg edema, but no shortness of breath.  BNP 260, does not seem to have CHF exacerbation, but obviously patient had risk of  developing CHF exacerbation. -Give 40 mg of Lasix IV now -Continue home Lasix 40 mg daily tomorrow  Atrial fibrillation, chronic ([place]): Heart rate 58.  Patient is not taking metoprolol currently.  Patient is not taking anticoagulants except for Plavix. -Telemetry monitoring  Chronic kidney disease, stage 3b [facility]): Patient had baseline creatinine 1.39 on 12/15/2021.  Her creatinine was 1.75 on 01/07/2022.  Today creatinine is 1.70, BUN 35, GFR 30, not worse than recent level of creatinine. -Monitor renal functio by BMP  Hypokalemia: K 2.9 -Repleted potassium -Check magnesium level --> 2.1  HLD (hyperlipidemia) -Crestor  Depression: -continue home meds  Temporal arteritis -Continue home prednisone 5 mg daily   DVT ppx: SCD  Code Status: Full code  Family Communication:   Yes, patient's daughter by phone  Disposition Plan:  Anticipate discharge back to previous environment  Consults called:  Dr. Marius Ditch of GI  Admission status and Level of care: Telemetry Medical:     as inpt      Dispo: The patient is from: Home              Anticipated d/c is to: Home              Anticipated d/c date is: 2 days              Patient currently is not medically stable to d/c.    Severity of Illness:  The appropriate patient status for this patient is INPATIENT. Inpatient status is judged to be reasonable and necessary in order to provide the required intensity of service to ensure the patient's safety. The patient's presenting symptoms, physical exam findings, and initial radiographic and laboratory data in the context of their chronic comorbidities is felt to place them at high risk for further clinical deterioration. Furthermore, it is not anticipated that the patient will be medically stable for discharge from the hospital within 2 midnights of admission.   *  I certify that at the point of admission it is my clinical judgment that the patient will require inpatient hospital care spanning  beyond 2 midnights from the point of admission due to high intensity of service, high risk for further deterioration and high frequency of surveillance required.*       Date of Service 01/27/2022    Ivor Costa [facility]   If 7PM-7AM, please contact night-coverage [email or web address] 01/27/2022, 2:44 PM

## 2022-01-28 ENCOUNTER — Encounter
Admission: EM | Disposition: A | Discharge status: Home / Self Care | Payer: Self-pay | Source: Home / Self Care | Attending: Internal Medicine

## 2022-01-28 ENCOUNTER — Encounter: Payer: Self-pay | Admitting: Internal Medicine

## 2022-01-28 HISTORY — PX: GIVENS CAPSULE STUDY: SHX5432

## 2022-01-28 LAB — TYPE AND SCREEN
ABO/RH(D): A NEG
Antibody Screen: NEGATIVE
Unit division: 0
Unit division: 0

## 2022-01-28 LAB — CBC
HCT: 23.9 % — ABNORMAL LOW (ref 36.0–46.0)
HCT: 23.8 % — ABNORMAL LOW (ref 36.0–46.0)
HCT: 27.2 % — ABNORMAL LOW (ref 36.0–46.0)
Hemoglobin: 7.6 g/dL — ABNORMAL LOW (ref 12.0–15.0)
Hemoglobin: 7.6 g/dL — ABNORMAL LOW (ref 12.0–15.0)
Hemoglobin: 8.6 g/dL — ABNORMAL LOW (ref 12.0–15.0)
MCH: 29.6 pg (ref 26.0–34.0)
MCH: 29.8 pg (ref 26.0–34.0)
MCH: 30.1 pg (ref 26.0–34.0)
MCHC: 31.8 g/dL (ref 30.0–36.0)
MCHC: 31.9 g/dL (ref 30.0–36.0)
MCHC: 31.6 g/dL (ref 30.0–36.0)
MCV: 93 fL (ref 80.0–100.0)
MCV: 93.3 fL (ref 80.0–100.0)
MCV: 95.1 fL (ref 80.0–100.0)
Platelets: 207 10*3/uL (ref 150–400)
Platelets: 209 10*3/uL (ref 150–400)
Platelets: 240 10*3/uL (ref 150–400)
RBC: 2.57 MIL/uL — ABNORMAL LOW (ref 3.87–5.11)
RBC: 2.55 MIL/uL — ABNORMAL LOW (ref 3.87–5.11)
RBC: 2.86 MIL/uL — ABNORMAL LOW (ref 3.87–5.11)
RDW: 16.8 % — ABNORMAL HIGH (ref 11.5–15.5)
RDW: 17.2 % — ABNORMAL HIGH (ref 11.5–15.5)
RDW: 17.2 % — ABNORMAL HIGH (ref 11.5–15.5)
WBC: 6.4 10*3/uL (ref 4.0–10.5)
WBC: 4.8 10*3/uL (ref 4.0–10.5)
WBC: 5.7 10*3/uL (ref 4.0–10.5)
nRBC: 0 % (ref 0.0–0.2)
nRBC: 0 % (ref 0.0–0.2)
nRBC: 0 % (ref 0.0–0.2)

## 2022-01-28 LAB — BPAM RBC
Blood Product Expiration Date: 202311262359
Blood Product Expiration Date: 202312012359
ISSUE DATE / TIME: 202311211218
ISSUE DATE / TIME: 202311212141
Unit Type and Rh: 600
Unit Type and Rh: 600

## 2022-01-28 LAB — BASIC METABOLIC PANEL
Anion gap: 5 (ref 5–15)
BUN: 28 mg/dL — ABNORMAL HIGH (ref 8–23)
CO2: 28 mmol/L (ref 22–32)
Calcium: 9.1 mg/dL (ref 8.9–10.3)
Chloride: 106 mmol/L (ref 98–111)
Creatinine, Ser: 1.56 mg/dL — ABNORMAL HIGH (ref 0.44–1.00)
GFR, Estimated: 33 mL/min — ABNORMAL LOW (ref >60)
Glucose, Bld: 98 mg/dL (ref 70–99)
Potassium: 3.7 mmol/L (ref 3.5–5.1)
Sodium: 139 mmol/L (ref 135–145)

## 2022-01-28 SURGERY — IMAGING PROCEDURE, GI TRACT, INTRALUMINAL, VIA CAPSULE

## 2022-01-28 MED ORDER — CLOPIDOGREL BISULFATE 75 MG PO TABS
75.0000 mg | ORAL_TABLET | Freq: Every day | ORAL | Status: DC
  Administered 2022-01-29: 75 mg via ORAL
  Filled 2022-01-28: qty 1

## 2022-01-28 MED ORDER — SODIUM CHLORIDE 0.9 % IV SOLN
300.0000 mg | Freq: Once | INTRAVENOUS | Status: AC
  Administered 2022-01-28: 300 mg via INTRAVENOUS
  Filled 2022-01-28: qty 15

## 2022-01-28 MED ORDER — POTASSIUM CHLORIDE CRYS ER 20 MEQ PO TBCR
40.0000 meq | EXTENDED_RELEASE_TABLET | Freq: Every day | ORAL | Status: DC
  Administered 2022-01-28 – 2022-01-29 (×2): 40 meq via ORAL
  Filled 2022-01-28 (×2): qty 2

## 2022-01-28 NOTE — Progress Notes (Signed)
Capsule reached cecum Study is suboptimal due to small bowel images captured only for 78mn, rest of the images are from large bowel for 8hr study period.  There was no active bleeding on this study  Patient to follow up with Dr RVirgina Jockas outpt in 2weeks Continue iron therapy, close follow up with hematology for IV iron Ok to resume plavix every other day   RCephas Darby MD [facility], [facility] [place] [address] Main: [phone] Fax: [phone]: [phone]

## 2022-01-28 NOTE — TOC Progression Note (Signed)
Transition of Care [facility]) - Progression Note    Patient Details  Name: Rebecca Gilmore MRN: [id number] Date of Birth: January 24, 1941  Transition of Care [facility]) CM/SW [place], RN Phone Number: 01/28/2022, 12:18 PM  Clinical Narrative:      The patient lives with her spouse, grandson lives next door and is supportive. Daughter Anderson Malta usually drives patient to appointments, if she is unavailable then other family will take patient.  PCP is [facility] in [place]. Pharmacy is [facility].  Patient has a RW, rollator, and 3in1 at home. As well as a rollator The patient was admitted last month and Discharged with [place] for Home health, I notified Tommi Rumps with [facility] of the admission   Expected Discharge Plan: [place] Barriers to Discharge: Continued Medical Work up  Expected Discharge Plan and Services Expected Discharge Plan: [place]   Discharge Planning Services: CM Consult   Living arrangements for the past 2 months: Single Family Home                 DME Arranged: N/A DME Agency: NA         HH Agency: [place] Date [place]: 01/28/22 Time [place]: 1215 Representative spoke with at [place]: Notified Tommi Rumps that Patient is in the Hospital   Social Determinants of Health (SDOH) Interventions    Readmission Risk Interventions     No data to display

## 2022-01-28 NOTE — Progress Notes (Signed)
PROGRESS NOTE  Rebecca Gilmore  VWP:[id number] DOB: Sep 14, 1940 DOA: 01/27/2022 PCP: Philmore Pali, NP (Inactive)   Brief Narrative: Patient is a 81 year old female with history of hypertension, hyperlipidemia, GERD, depression, diastolic CHF, CKD stage IIIb, atrial fibrillation/DVT not on anticoagulation, peripheral vascular disease, iron deficiency anemia, recent admission for GI bleed, temporal arteritis on low-dose prednisone presents for evaluation of low hemoglobin.  Patient was recently hospitalized from 10/5 - 10/9 for GI bleed during which time hemoglobin was down to 4.9, she required blood transfusion and she was discharged on 10/9.  She underwent EGD/colonocopy at that time which did not show any source of bleeding but showed some gastritis.  Outpatient lab work done recently showed hemoglobin of 6, and she was prompted to ED.  GI consulted again.  Plan for capsule endoscopy.  Assessment & Plan:  Principal Problem:   Acute blood loss anemia Active Problems:   GI bleeding   Iron deficiency anemia   Chronic diastolic CHF (congestive heart failure) (HCC)   Atrial fibrillation, chronic (HCC)   Chronic kidney disease, stage 3b (HCC)   Hypokalemia   HLD (hyperlipidemia)   Temporal arteritis (HCC)   Depression   Acute on chronic blood loss anemia   Acute blood loss anemia secondary to occult GI bleed: Patient was admitted for the same and was hospitalized from 10/5 - 10/9 for GI bleed during which time hemoglobin was down to 4.9, she required blood transfusion and she was discharged on 10/9.  She underwent EGD/colonocopy at that time which did not show any source of bleeding but showed some gastritis.  Continue IV PPI.  Gastroenterology reconsulted.  Plan to do video capsule endoscopy. Hemoglobin was in the range of 6 on admission, FOBT positive.  She was transfused with a unit of PRBC this  admission.  Hemoglobin this morning the range of 7.  Avoid NSAIDs, blood thinners  Chronic  diastolic CHF: Last echo done on 11/17/2021 showed EF of 60 to 37%, grade 2 diastolic dysfunction.  Has lower extremity edema but no shortness of breath.  Mild elevated BNP.  Continue Lasix at home dose.  Chronic A-fib: Currently rate is controlled.  Not taking rate control medications.  Not on anticoagulation except for Plavix every other day.  CKD stage IIIb: Baseline creatinine around 1.3.  Currently kidney function  close at baseline  Hypokalemia: Potassium 2.9 on presentation.  Continue to monitor and supplement aggressively  Hyperlipidemia: On Crestor  Depression: Continue home medications  Temporal arteritis: On prednisone 5 mg daily        DVT prophylaxis:SCDs Start: 01/27/22 1242     Code Status: Full Code  Family Communication: None at bedside  Patient status:Inpatient  Patient is from :Home  Anticipated discharge SM:[facility]  Estimated DC date:102 days   Consultants: GI  Procedures: Undergoing video capsule endoscopy study  Antimicrobials:  Anti-infectives (From admission, onward)    None       Subjective: Patient seen and examined at the bedside today.  Comfortable, hemodynamically stable.  She was undergoing video capsule endoscopy.  No report of stools, abdominal pain.  She was sitting on a potty.Eager to go home.  Discussed about the importance of completing the procedure, checking hemoglobin tomorrow.   Objective: Vitals:   01/27/22 2201 01/27/22 2358 01/28/22 0030 01/28/22 0801  BP: (!) 130/31 (!) 118/44 (!) 130/39 (!) 115/36  Pulse: (!) 55 62 60 (!) 56  Resp: _0 Temp: 97.7 F (36.5 C) 97.6 F (36.4  C) 98 F (36.7 C) 98.1 F (36.7 C)  TempSrc:   Oral   SpO2:  97%  97%  Weight:      Height:        Intake/Output Summary (Last 24 hours) at 01/28/2022 1357 Last data filed at 01/28/2022 0030 Gross per 24 hour  Intake 630 ml  Output --  Net 630 ml   Filed Weights   01/27/22 1023  Weight: 107.5 kg    Examination:  General  exam: Overall comfortable, not in distress, pleasant elderly female HEENT: PERRL Respiratory system:  no wheezes or crackles  Cardiovascular system: S1 & S2 heard, RRR.  Gastrointestinal system: Abdomen is nondistended, soft and nontender. Central nervous system: Alert and oriented Extremities: lower extremity pitting edema, no clubbing ,no cyanosis Skin: No rashes, no ulcers,no icterus     Data Reviewed: I have personally reviewed following labs and imaging studies  CBC: Recent Labs  Lab 01/27/22 1027 01/27/22 1717 01/28/22 0149 01/28/22 1020  WBC 4.3 5.0 6.4 4.8  NEUTROABS 2.6  --   --   --   HGB 6.2* 6.9* 7.6* 7.6*  HCT 20.2* 21.6* 23.9* 23.8*  MCV 99.5 96.0 93.0 93.3  PLT 228 204 207 625   Basic Metabolic Panel: Recent Labs  Lab 01/27/22 1027 01/28/22 0149  NA 141 139  K 2.9* 3.7  CL 103 106  CO2 29 28  GLUCOSE 108* 98  BUN 35* 28*  CREATININE 1.70* 1.56*  CALCIUM 9.5 9.1  MG 2.1  --      No results found for this or any previous visit (from the past 240 hour(s)).   Radiology Studies: No results found.  Scheduled Meds:  cholecalciferol  1,000 Units Oral Daily   cyanocobalamin  1,000 mcg Oral Daily   ferrous sulfate  325 mg Oral Daily   furosemide  40 mg Intravenous Once   furosemide  40 mg Oral Daily   pantoprazole (PROTONIX) IV  40 mg Intravenous Q12H   predniSONE  1 mg Oral Daily   pregabalin  75 mg Oral Daily   rosuvastatin  5 mg Oral QHS   sertraline  25 mg Oral Daily   Continuous Infusions:   LOS: 1 day   Shelly Coss, MD [facility] P11/22/2023, 1:57 PM

## 2022-01-28 NOTE — Plan of Care (Signed)

## 2022-01-29 LAB — BASIC METABOLIC PANEL
Anion gap: 5 (ref 5–15)
BUN: 26 mg/dL — ABNORMAL HIGH (ref 8–23)
CO2: 27 mmol/L (ref 22–32)
Calcium: 9.4 mg/dL (ref 8.9–10.3)
Chloride: 110 mmol/L (ref 98–111)
Creatinine, Ser: 1.75 mg/dL — ABNORMAL HIGH (ref 0.44–1.00)
GFR, Estimated: 29 mL/min — ABNORMAL LOW (ref >60)
Glucose, Bld: 99 mg/dL (ref 70–99)
Potassium: 4.2 mmol/L (ref 3.5–5.1)
Sodium: 142 mmol/L (ref 135–145)

## 2022-01-29 LAB — CBC
HCT: 25.2 % — ABNORMAL LOW (ref 36.0–46.0)
Hemoglobin: 7.9 g/dL — ABNORMAL LOW (ref 12.0–15.0)
MCH: 30 pg (ref 26.0–34.0)
MCHC: 31.3 g/dL (ref 30.0–36.0)
MCV: 95.8 fL (ref 80.0–100.0)
Platelets: 203 10*3/uL (ref 150–400)
RBC: 2.63 MIL/uL — ABNORMAL LOW (ref 3.87–5.11)
RDW: 17 % — ABNORMAL HIGH (ref 11.5–15.5)
WBC: 5.2 10*3/uL (ref 4.0–10.5)
nRBC: 0 % (ref 0.0–0.2)

## 2022-01-29 MED ORDER — POTASSIUM CHLORIDE CRYS ER 20 MEQ PO TBCR: 20.0000 meq | EXTENDED_RELEASE_TABLET | Freq: Every day | ORAL | 0 refills | Status: DC

## 2022-01-29 MED ORDER — CLOPIDOGREL BISULFATE 75 MG PO TABS: 75.0000 mg | ORAL_TABLET | ORAL | 6 refills | Status: DC

## 2022-01-29 MED ORDER — METOPROLOL SUCCINATE ER 25 MG PO TB24: 12.5000 mg | ORAL_TABLET | Freq: Every day | ORAL | Status: DC

## 2022-01-29 MED ORDER — TORSEMIDE 100 MG PO TABS: 50.0000 mg | ORAL_TABLET | Freq: Every day | ORAL | Status: DC

## 2022-01-29 NOTE — Discharge Summary (Signed)
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  Patient was recently hospitalized from 10/5 - 10/9 for GI bleed during which time hemoglobin was down to 4.9, she required blood transfusion and she was discharged on 10/9.  She underwent EGD/colonocopy at that time which did not show any source of bleeding but showed some gastritis.  Outpatient lab work done recently showed hemoglobin of 6, and she was prompted to ED.  GI consulted again.  S/P capsule endoscopy without finding of any bleeding source. GI cleared for discharge.  She needs to follow-up with gastroenterology in 2 weeks as an outpatient.  We have recommend her to follow-up with PCP to check CBC and BMP just in a week  Following problems were addressed during her hospitalization:  Acute blood loss anemia secondary to occult GI bleed: Patient was admitted for the same and was hospitalized from 10/5 - 10/9 for GI bleed during which time hemoglobin was down to 4.9, she required blood transfusion and she was discharged on 10/9.  She underwent EGD/colonocopy at that time which did not show any source of bleeding but showed some gastritis.  Continue IV PPI.  Gastroenterology reconsulted. Hemoglobin was in the range of 6 on admission, FOBT positive.  She was transfused with a unit of PRBC this  admission.    S/P  capsule endoscopy without  finding of any bleeding source.  GI cleared for discharge.  She needs to follow-up with gastroenterology in 2 weeks as an outpatient.  Hemoglobin this morning the range of 7. Avoid NSAIDs, blood thinners.Ok to resume Plavix as per GI   Chronic diastolic CHF: Last echo done on 11/17/2021 showed EF of 60 to 16%, grade 2 diastolic dysfunction.  Has lower extremity edema but no shortness of breath.  Mild elevated BNP.  On torsemide 100 mg recently started but has not taken it.  We recommend to continue torsemide 50 mg daily along with potassium.   Chronic A-fib: Currently rate is controlled.  Not taking rate control medications, but she was supposed to be on metoprolol.  Not on anticoagulation except for Plavix every other day.  We recommend to discuss with her PCP for continuation of metoprolol   CKD stage IIIb: Baseline creatinine around 1.3.  Currently kidney function around 1.7.  We recommend to follow-up with PCP in a week and a BMP test done.   Hypokalemia: Supplemented and corrected.  We recommend to continue potassium   Hyperlipidemia: On Crestor   Depression: Continue home medications   Temporal arteritis: On prednisone 5 mg daily  Discharge Diagnoses:  Principal Problem:   Acute blood loss anemia Active Problems:   GI bleeding   Iron deficiency anemia   Chronic diastolic CHF (congestive heart failure) (HCC)   Atrial fibrillation, chronic (HCC)   Chronic kidney disease, stage 3b (HCC)   Hypokalemia   HLD (hyperlipidemia)   Temporal arteritis (HCC)   Depression   Acute on chronic blood loss anemia    Discharge Instructions  Discharge Instructions  Diet - low sodium heart healthy   Complete by: As directed    Discharge instructions   Complete by: As directed    1)Please take your medications as instructed 2)Follow up with your PCP in a week.  Do a CBC, BMP test to check your hemoglobin and kidney function. 3)Follow up with gastroenterology in 2 weeks.  Name and  number the provider has attached   Increase activity slowly   Complete by: As directed    No wound care   Complete by: As directed       Allergies as of 01/29/2022       Reactions   Morphine Hives, Itching   Other Reaction(s): Confusion   Morphine And Related Itching   Kenalog [triamcinolone Acetonide] Other (See Comments)   FLUSH IN FACE   Penicillins Other (See Comments)   Has patient had a PCN reaction causing immediate rash, facial/tongue/throat swelling, SOB or lightheadedness with hypotension: Unknown Has patient had a PCN reaction causing severe rash involving mucus membranes or skin necrosis: Unknown Has patient had a PCN reaction that required hospitalization:No Has patient had a PCN reaction occurring within the last 10 years: No If all of the above answers are "NO", then may proceed with Cephalosporin use.        Medication List     STOP taking these medications    furosemide 40 MG tablet Commonly known as: LASIX       TAKE these medications    acetaminophen 325 MG tablet Commonly known as: TYLENOL Take 2 tablets (650 mg total) by mouth every 6 (six) hours as needed for mild pain or fever.   amitriptyline 25 MG tablet Commonly known as: ELAVIL Take 25 mg by mouth at bedtime.   cholecalciferol 25 MCG (1000 UNIT) tablet Commonly known as: VITAMIN D3 Take 1,000 Units by mouth daily.   clopidogrel 75 MG tablet Commonly known as: PLAVIX Take 1 tablet (75 mg total) by mouth every other day. What changed: when to take this   cyanocobalamin 1000 MCG tablet Commonly known as: VITAMIN B12 Take 1,000 mcg by mouth.   Iron 325 (65 Fe) MG Tabs Take 1 tablet by mouth daily.   loratadine 10 MG tablet Commonly known as: CLARITIN Take 10 mg by mouth daily as needed.   metoprolol succinate 25 MG 24 hr tablet Commonly known as: TOPROL-XL Take 0.5 tablets (12.5 mg total) by mouth daily. Discuss with your doctor if u need to continue this med What changed:  additional instructions   pantoprazole 40 MG tablet Commonly known as: PROTONIX Take 40 mg by mouth every morning.   potassium chloride SA 20 MEQ tablet Commonly known as: KLOR-CON M Take 1 tablet (20 mEq total) by mouth daily.   predniSONE 1 MG tablet Commonly known as: DELTASONE Take 1 mg by mouth daily.   pregabalin 75 MG capsule Commonly known as: Lyrica Take 1 capsule (75 mg total) by mouth daily.   rosuvastatin 5 MG tablet Commonly known as: CRESTOR Take 5 mg by mouth daily.   sertraline 50 MG tablet Commonly known as: ZOLOFT Take 25 mg by mouth daily.   torsemide 100 MG tablet Commonly known as: DEMADEX Take 0.5 tablets (50 mg total) by mouth daily. What changed: how much to take        Follow-up Information     Lam, Rudi Rummage, NP. Schedule an appointment as soon as possible for a visit in 1 week(s).   Specialty: Nurse Practitioner Contact information: [phone]  [place] [address] [phone]         [name], DO. Schedule an appointment as soon as possible for a visit in 2 week(s).   Specialty: Gastroenterology Contact information: [facility] [place] [address] [phone]                Allergies  Allergen Reactions   Morphine Hives and Itching    Other Reaction(s): Confusion   Morphine And Related Itching   Kenalog [Triamcinolone Acetonide] Other (See Comments)    FLUSH IN FACE   Penicillins Other (See Comments)    Has patient had a PCN reaction causing immediate rash, facial/tongue/throat swelling, SOB or lightheadedness with hypotension: Unknown Has patient had a PCN reaction causing severe rash involving mucus membranes or skin necrosis: Unknown Has patient had a PCN reaction that required hospitalization:No Has patient had a PCN reaction occurring within the last 10 years: No If all of the above answers are "NO", then may proceed with Cephalosporin use.      Consultations: GI   Procedures/Studies: No results found.    Subjective: Patient seen and examined at bedside today.  Hemodynamically stable for discharge today.I called and discussed the discharge planning with daughter on phone   Discharge Exam: Vitals:   01/28/22 1553 01/29/22 0045  BP: (!) 115/54 116/62  Pulse: (!) 58 63  Resp: 16 16  Temp: 98 F (36.7 C) 98.3 F (36.8 C)  SpO2: 100% 94%   Vitals:   01/28/22 0030 01/28/22 0801 01/28/22 1553 01/29/22 0045  BP: (!) 130/39 (!) 115/36 (!) 115/54 116/62  Pulse: 60 (!) 56 (!) 58 63  Resp: _0 Temp: 98 F (36.7 C) 98.1 F (36.7 C) 98 F (36.7 C) 98.3 F (36.8 C)  TempSrc: Oral     SpO2:  97% 100% 94%  Weight:      Height:        General: Pt is alert, awake, not in acute distress Cardiovascular: RRR, S1/S2 +, no rubs, no gallops Respiratory: CTA bilaterally, no wheezing, no rhonchi Abdominal: Soft, NT, ND, bowel sounds + Extremities: no edema, no cyanosis    The results of significant diagnostics from this hospitalization (including imaging, microbiology, ancillary and laboratory) are listed below for reference.     Microbiology: No results found for this or any previous visit (from the past 240 hour(s)).   Labs: BNP (last 3 results) Recent Labs    12/11/21 0903 01/27/22 1027  BNP 262.4* 703.5*   Basic Metabolic Panel: Recent Labs  Lab 01/27/22 1027 01/28/22 0149 01/29/22 0619  NA 141 139 142  K 2.9* 3.7 4.2  CL 103 106 110  CO2 _1 GLUCOSE 108* 98 99  BUN 35* 28* 26*  CREATININE 1.70* 1.56* 1.75*  CALCIUM 9.5 9.1 9.4  MG 2.1  --   --    Liver Function Tests: Recent Labs  Lab 01/27/22 1027  AST 22  ALT 8  ALKPHOS 44  BILITOT 0.7  PROT 7.0  ALBUMIN 3.7   No results for input(s): "LIPASE", "AMYLASE" in the last 168 hours. No results for input(s): "AMMONIA" in the last 168 hours. CBC: Recent Labs  Lab 01/27/22 1027 01/27/22 1717 01/28/22 0149 01/28/22 1020  01/28/22 1623 01/29/22 0619  WBC 4.3 5.0 6.4 4.8 5.7 5.2  NEUTROABS 2.6  --   --   --   --   --   HGB 6.2* 6.9* 7.6* 7.6* 8.6*  7.9*  HCT 20.2* 21.6* 23.9* 23.8* 27.2* 25.2*  MCV 99.5 96.0 93.0 93.3 95.1 95.8  PLT 228 204 207 209 240 203   Cardiac Enzymes: No results for input(s): "CKTOTAL", "CKMB", "CKMBINDEX", "TROPONINI" in the last 168 hours. BNP: Invalid input(s): "POCBNP" CBG: No results for input(s): "GLUCAP" in the last 168 hours. D-Dimer No results for input(s): "DDIMER" in the last 72 hours. Hgb A1c No results for input(s): "HGBA1C" in the last 72 hours. Lipid Profile No results for input(s): "CHOL", "HDL", "LDLCALC", "TRIG", "CHOLHDL", "LDLDIRECT" in the last 72 hours. Thyroid function studies No results for input(s): "TSH", "T4TOTAL", "T3FREE", "THYROIDAB" in the last 72 hours.  Invalid input(s): "FREET3" Anemia work up No results for input(s): "VITAMINB12", "FOLATE", "FERRITIN", "TIBC", "IRON", "RETICCTPCT" in the last 72 hours. Urinalysis    Component Value Date/Time   COLORURINE YELLOW (A) 11/19/2021 1223   APPEARANCEUR CLOUDY (A) 11/19/2021 1223   LABSPEC 1.015 11/19/2021 1223   PHURINE 5.0 11/19/2021 1223   GLUCOSEU NEGATIVE 11/19/2021 1223   HGBUR SMALL (A) 11/19/2021 1223   BILIRUBINUR NEGATIVE 11/19/2021 1223   KETONESUR NEGATIVE 11/19/2021 1223   PROTEINUR 30 (A) 11/19/2021 1223   NITRITE NEGATIVE 11/19/2021 1223   LEUKOCYTESUR LARGE (A) 11/19/2021 1223   Sepsis Labs Recent Labs  Lab 01/28/22 0149 01/28/22 1020 01/28/22 1623 01/29/22 0619  WBC 6.4 4.8 5.7 5.2   Microbiology No results found for this or any previous visit (from the past 240 hour(s)).  Please note: You were cared for by a hospitalist during your hospital stay. Once you are discharged, your primary care physician will handle any further medical issues. Please note that NO REFILLS for any discharge medications will be authorized once you are discharged, as it is imperative that  you return to your primary care physician (or establish a relationship with a primary care physician if you do not have one) for your post hospital discharge needs so that they can reassess your need for medications and monitor your lab values.    Time coordinating discharge: 40 minutes  SIGNED:   Shelly Coss, MD  [facility] 01/29/2022, 8:55 AM Pager [phone]  If 7PM-7AM, please contact night-coverage [email or web address] Password [email or web address]

## 2022-01-29 NOTE — Plan of Care (Signed)

## 2022-02-09 DIAGNOSIS — K922 Gastrointestinal hemorrhage, unspecified: Secondary | ICD-10-CM | POA: Diagnosis not present

## 2022-02-09 DIAGNOSIS — R600 Localized edema: Secondary | ICD-10-CM | POA: Diagnosis not present

## 2022-02-09 DIAGNOSIS — E876 Hypokalemia: Secondary | ICD-10-CM | POA: Diagnosis not present

## 2022-02-09 DIAGNOSIS — Z79899 Other long term (current) drug therapy: Secondary | ICD-10-CM | POA: Diagnosis not present

## 2022-02-10 ENCOUNTER — Other Ambulatory Visit: Payer: Medicare Other

## 2022-02-11 ENCOUNTER — Ambulatory Visit
Admission: RE | Admit: 2022-02-11 | Discharge: 2022-02-11 | Disposition: A | Discharge status: Home / Self Care | Payer: Medicare Other | Source: Ambulatory Visit | Attending: Family Medicine | Admitting: Family Medicine

## 2022-02-11 DIAGNOSIS — M79605 Pain in left leg: Secondary | ICD-10-CM | POA: Diagnosis not present

## 2022-02-11 DIAGNOSIS — M79604 Pain in right leg: Secondary | ICD-10-CM | POA: Diagnosis not present

## 2022-02-11 DIAGNOSIS — R600 Localized edema: Secondary | ICD-10-CM | POA: Diagnosis not present

## 2022-02-14 DIAGNOSIS — I214 Non-ST elevation (NSTEMI) myocardial infarction: Secondary | ICD-10-CM | POA: Diagnosis not present

## 2022-02-18 ENCOUNTER — Encounter: Payer: Self-pay | Admitting: Internal Medicine

## 2022-02-18 ENCOUNTER — Other Ambulatory Visit: Payer: Self-pay

## 2022-02-18 ENCOUNTER — Emergency Department: Payer: Medicare Other

## 2022-02-18 ENCOUNTER — Inpatient Hospital Stay
Admission: EM | Admit: 2022-02-18 | Discharge: 2022-02-20 | DRG: 811 | Disposition: A | Discharge status: Home / Self Care | Payer: Medicare Other | Attending: Osteopathic Medicine | Admitting: Osteopathic Medicine

## 2022-02-18 DIAGNOSIS — N1832 Chronic kidney disease, stage 3b: Secondary | ICD-10-CM | POA: Diagnosis present

## 2022-02-18 DIAGNOSIS — F32A Depression, unspecified: Secondary | ICD-10-CM | POA: Diagnosis not present

## 2022-02-18 DIAGNOSIS — E876 Hypokalemia: Secondary | ICD-10-CM | POA: Diagnosis present

## 2022-02-18 DIAGNOSIS — I482 Chronic atrial fibrillation, unspecified: Secondary | ICD-10-CM | POA: Diagnosis present

## 2022-02-18 DIAGNOSIS — Z880 Allergy status to penicillin: Secondary | ICD-10-CM | POA: Diagnosis not present

## 2022-02-18 DIAGNOSIS — D509 Iron deficiency anemia, unspecified: Principal | ICD-10-CM | POA: Diagnosis present

## 2022-02-18 DIAGNOSIS — Z6841 Body Mass Index (BMI) 40.0 and over, adult: Secondary | ICD-10-CM

## 2022-02-18 DIAGNOSIS — K449 Diaphragmatic hernia without obstruction or gangrene: Secondary | ICD-10-CM | POA: Diagnosis not present

## 2022-02-18 DIAGNOSIS — R195 Other fecal abnormalities: Secondary | ICD-10-CM | POA: Diagnosis not present

## 2022-02-18 DIAGNOSIS — I5033 Acute on chronic diastolic (congestive) heart failure: Secondary | ICD-10-CM | POA: Diagnosis present

## 2022-02-18 DIAGNOSIS — Z885 Allergy status to narcotic agent status: Secondary | ICD-10-CM

## 2022-02-18 DIAGNOSIS — K922 Gastrointestinal hemorrhage, unspecified: Secondary | ICD-10-CM | POA: Diagnosis present

## 2022-02-18 DIAGNOSIS — D472 Monoclonal gammopathy: Secondary | ICD-10-CM | POA: Diagnosis present

## 2022-02-18 DIAGNOSIS — I5032 Chronic diastolic (congestive) heart failure: Secondary | ICD-10-CM

## 2022-02-18 DIAGNOSIS — K219 Gastro-esophageal reflux disease without esophagitis: Secondary | ICD-10-CM | POA: Diagnosis present

## 2022-02-18 DIAGNOSIS — J811 Chronic pulmonary edema: Secondary | ICD-10-CM | POA: Diagnosis not present

## 2022-02-18 DIAGNOSIS — I130 Hypertensive heart and chronic kidney disease with heart failure and stage 1 through stage 4 chronic kidney disease, or unspecified chronic kidney disease: Secondary | ICD-10-CM | POA: Diagnosis present

## 2022-02-18 DIAGNOSIS — D500 Iron deficiency anemia secondary to blood loss (chronic): Secondary | ICD-10-CM | POA: Diagnosis not present

## 2022-02-18 DIAGNOSIS — I70202 Unspecified atherosclerosis of native arteries of extremities, left leg: Secondary | ICD-10-CM | POA: Diagnosis not present

## 2022-02-18 DIAGNOSIS — M316 Other giant cell arteritis: Secondary | ICD-10-CM | POA: Diagnosis present

## 2022-02-18 DIAGNOSIS — D649 Anemia, unspecified: Secondary | ICD-10-CM | POA: Diagnosis not present

## 2022-02-18 DIAGNOSIS — Z8249 Family history of ischemic heart disease and other diseases of the circulatory system: Secondary | ICD-10-CM | POA: Diagnosis not present

## 2022-02-18 DIAGNOSIS — Z79899 Other long term (current) drug therapy: Secondary | ICD-10-CM

## 2022-02-18 DIAGNOSIS — R0602 Shortness of breath: Secondary | ICD-10-CM | POA: Diagnosis not present

## 2022-02-18 DIAGNOSIS — I480 Paroxysmal atrial fibrillation: Secondary | ICD-10-CM | POA: Diagnosis present

## 2022-02-18 DIAGNOSIS — I4892 Unspecified atrial flutter: Secondary | ICD-10-CM | POA: Diagnosis not present

## 2022-02-18 DIAGNOSIS — M810 Age-related osteoporosis without current pathological fracture: Secondary | ICD-10-CM | POA: Diagnosis not present

## 2022-02-18 DIAGNOSIS — Z87891 Personal history of nicotine dependence: Secondary | ICD-10-CM | POA: Diagnosis not present

## 2022-02-18 DIAGNOSIS — I4891 Unspecified atrial fibrillation: Secondary | ICD-10-CM | POA: Diagnosis not present

## 2022-02-18 DIAGNOSIS — I739 Peripheral vascular disease, unspecified: Secondary | ICD-10-CM | POA: Diagnosis not present

## 2022-02-18 DIAGNOSIS — E785 Hyperlipidemia, unspecified: Secondary | ICD-10-CM | POA: Diagnosis not present

## 2022-02-18 DIAGNOSIS — Z888 Allergy status to other drugs, medicaments and biological substances status: Secondary | ICD-10-CM

## 2022-02-18 DIAGNOSIS — I350 Nonrheumatic aortic (valve) stenosis: Secondary | ICD-10-CM | POA: Diagnosis not present

## 2022-02-18 DIAGNOSIS — D6489 Other specified anemias: Secondary | ICD-10-CM | POA: Diagnosis not present

## 2022-02-18 DIAGNOSIS — Z7902 Long term (current) use of antithrombotics/antiplatelets: Secondary | ICD-10-CM

## 2022-02-18 DIAGNOSIS — R609 Edema, unspecified: Secondary | ICD-10-CM | POA: Diagnosis not present

## 2022-02-18 DIAGNOSIS — Z7952 Long term (current) use of systemic steroids: Secondary | ICD-10-CM

## 2022-02-18 LAB — CBC WITH DIFFERENTIAL/PLATELET
Abs Immature Granulocytes: 0.02 10*3/uL (ref 0.00–0.07)
Basophils Absolute: 0 10*3/uL (ref 0.0–0.1)
Basophils Relative: 1 %
Eosinophils Absolute: 0.1 10*3/uL (ref 0.0–0.5)
Eosinophils Relative: 2 %
HCT: 21.6 % — ABNORMAL LOW (ref 36.0–46.0)
Hemoglobin: 6.5 g/dL — ABNORMAL LOW (ref 12.0–15.0)
Immature Granulocytes: 0 %
Lymphocytes Relative: 20 %
Lymphs Abs: 1.1 10*3/uL (ref 0.7–4.0)
MCH: 29.7 pg (ref 26.0–34.0)
MCHC: 30.1 g/dL (ref 30.0–36.0)
MCV: 98.6 fL (ref 80.0–100.0)
Monocytes Absolute: 0.5 10*3/uL (ref 0.1–1.0)
Monocytes Relative: 10 %
Neutro Abs: 3.7 10*3/uL (ref 1.7–7.7)
Neutrophils Relative %: 67 %
Platelets: 210 10*3/uL (ref 150–400)
RBC: 2.19 MIL/uL — ABNORMAL LOW (ref 3.87–5.11)
RDW: 14.2 % (ref 11.5–15.5)
WBC: 5.5 10*3/uL (ref 4.0–10.5)
nRBC: 0 % (ref 0.0–0.2)

## 2022-02-18 LAB — CBC
HCT: 27.7 % — ABNORMAL LOW (ref 36.0–46.0)
HCT: 25.1 % — ABNORMAL LOW (ref 36.0–46.0)
Hemoglobin: 8.6 g/dL — ABNORMAL LOW (ref 12.0–15.0)
Hemoglobin: 7.9 g/dL — ABNORMAL LOW (ref 12.0–15.0)
MCH: 29.8 pg (ref 26.0–34.0)
MCH: 30 pg (ref 26.0–34.0)
MCHC: 31 g/dL (ref 30.0–36.0)
MCHC: 31.5 g/dL (ref 30.0–36.0)
MCV: 95.8 fL (ref 80.0–100.0)
MCV: 95.4 fL (ref 80.0–100.0)
Platelets: 218 10*3/uL (ref 150–400)
Platelets: 213 10*3/uL (ref 150–400)
RBC: 2.89 MIL/uL — ABNORMAL LOW (ref 3.87–5.11)
RBC: 2.63 MIL/uL — ABNORMAL LOW (ref 3.87–5.11)
RDW: 14.1 % (ref 11.5–15.5)
RDW: 14.4 % (ref 11.5–15.5)
WBC: 7 10*3/uL (ref 4.0–10.5)
WBC: 6.5 10*3/uL (ref 4.0–10.5)
nRBC: 0 % (ref 0.0–0.2)
nRBC: 0 % (ref 0.0–0.2)

## 2022-02-18 LAB — COMPREHENSIVE METABOLIC PANEL
ALT: 8 U/L (ref 0–44)
AST: 19 U/L (ref 15–41)
Albumin: 3.1 g/dL — ABNORMAL LOW (ref 3.5–5.0)
Alkaline Phosphatase: 49 U/L (ref 38–126)
Anion gap: 9 (ref 5–15)
BUN: 29 mg/dL — ABNORMAL HIGH (ref 8–23)
CO2: 26 mmol/L (ref 22–32)
Calcium: 8.8 mg/dL — ABNORMAL LOW (ref 8.9–10.3)
Chloride: 104 mmol/L (ref 98–111)
Creatinine, Ser: 1.69 mg/dL — ABNORMAL HIGH (ref 0.44–1.00)
GFR, Estimated: 30 mL/min — ABNORMAL LOW (ref >60)
Glucose, Bld: 109 mg/dL — ABNORMAL HIGH (ref 70–99)
Potassium: 3.3 mmol/L — ABNORMAL LOW (ref 3.5–5.1)
Sodium: 139 mmol/L (ref 135–145)
Total Bilirubin: 0.9 mg/dL (ref 0.3–1.2)
Total Protein: 6.4 g/dL — ABNORMAL LOW (ref 6.5–8.1)

## 2022-02-18 LAB — PROTIME-INR
INR: 1.2 (ref 0.8–1.2)
Prothrombin Time: 14.9 seconds (ref 11.4–15.2)

## 2022-02-18 LAB — APTT: aPTT: 35 seconds (ref 24–36)

## 2022-02-18 LAB — MAGNESIUM: Magnesium: 2.1 mg/dL (ref 1.7–2.4)

## 2022-02-18 LAB — BRAIN NATRIURETIC PEPTIDE: B Natriuretic Peptide: 287.3 pg/mL — ABNORMAL HIGH (ref 0.0–100.0)

## 2022-02-18 LAB — PREPARE RBC (CROSSMATCH)

## 2022-02-18 MED ORDER — VITAMIN B-12 1000 MCG PO TABS
1000.0000 ug | ORAL_TABLET | Freq: Every day | ORAL | Status: DC
  Administered 2022-02-19 – 2022-02-20 (×2): 1000 ug via ORAL
  Filled 2022-02-18 (×2): qty 1

## 2022-02-18 MED ORDER — LORATADINE 10 MG PO TABS: 10.0000 mg | ORAL_TABLET | Freq: Every day | ORAL | Status: DC | PRN

## 2022-02-18 MED ORDER — ALBUTEROL SULFATE HFA 108 (90 BASE) MCG/ACT IN AERS: 2.0000 | INHALATION_SPRAY | RESPIRATORY_TRACT | Status: DC | PRN

## 2022-02-18 MED ORDER — VITAMIN D 25 MCG (1000 UNIT) PO TABS
1000.0000 [IU] | ORAL_TABLET | Freq: Every day | ORAL | Status: DC
  Administered 2022-02-19 – 2022-02-20 (×2): 1000 [IU] via ORAL
  Filled 2022-02-18 (×2): qty 1

## 2022-02-18 MED ORDER — AMITRIPTYLINE HCL 25 MG PO TABS: 25.0000 mg | ORAL_TABLET | Freq: Every evening | ORAL | Status: DC | PRN

## 2022-02-18 MED ORDER — FERROUS SULFATE 325 (65 FE) MG PO TABS
325.0000 mg | ORAL_TABLET | Freq: Every day | ORAL | Status: DC
  Administered 2022-02-19 – 2022-02-20 (×2): 325 mg via ORAL
  Filled 2022-02-18 (×2): qty 1

## 2022-02-18 MED ORDER — FUROSEMIDE 10 MG/ML IJ SOLN
60.0000 mg | Freq: Two times a day (BID) | INTRAMUSCULAR | Status: DC
  Administered 2022-02-18 – 2022-02-20 (×4): 60 mg via INTRAVENOUS
  Filled 2022-02-18 (×4): qty 6

## 2022-02-18 MED ORDER — PANTOPRAZOLE SODIUM 40 MG IV SOLR
40.0000 mg | Freq: Two times a day (BID) | INTRAVENOUS | Status: DC
  Administered 2022-02-18 – 2022-02-20 (×4): 40 mg via INTRAVENOUS
  Filled 2022-02-18 (×4): qty 10

## 2022-02-18 MED ORDER — PANTOPRAZOLE 80MG IVPB - SIMPLE MED
80.0000 mg | Freq: Once | INTRAVENOUS | Status: AC
  Administered 2022-02-18: 80 mg via INTRAVENOUS
  Filled 2022-02-18: qty 100

## 2022-02-18 MED ORDER — POTASSIUM CHLORIDE CRYS ER 20 MEQ PO TBCR
40.0000 meq | EXTENDED_RELEASE_TABLET | Freq: Once | ORAL | Status: AC
  Administered 2022-02-18: 40 meq via ORAL
  Filled 2022-02-18: qty 2

## 2022-02-18 MED ORDER — DIPHENHYDRAMINE HCL 50 MG/ML IJ SOLN: 12.5000 mg | Freq: Three times a day (TID) | INTRAMUSCULAR | Status: DC | PRN

## 2022-02-18 MED ORDER — ALBUTEROL SULFATE (2.5 MG/3ML) 0.083% IN NEBU: 2.5000 mg | INHALATION_SOLUTION | RESPIRATORY_TRACT | Status: DC | PRN

## 2022-02-18 MED ORDER — PREGABALIN 75 MG PO CAPS
75.0000 mg | ORAL_CAPSULE | Freq: Every day | ORAL | Status: DC
  Administered 2022-02-19 – 2022-02-20 (×2): 75 mg via ORAL
  Filled 2022-02-18 (×2): qty 1

## 2022-02-18 MED ORDER — PREDNISONE 10 MG PO TABS
5.0000 mg | ORAL_TABLET | Freq: Every day | ORAL | Status: DC
  Administered 2022-02-18 – 2022-02-20 (×2): 5 mg via ORAL
  Filled 2022-02-18 (×3): qty 1

## 2022-02-18 MED ORDER — ROSUVASTATIN CALCIUM 10 MG PO TABS
5.0000 mg | ORAL_TABLET | Freq: Every day | ORAL | Status: DC
  Administered 2022-02-18 – 2022-02-20 (×3): 5 mg via ORAL
  Filled 2022-02-18 (×3): qty 1

## 2022-02-18 MED ORDER — FUROSEMIDE 10 MG/ML IJ SOLN
80.0000 mg | Freq: Once | INTRAMUSCULAR | Status: AC
  Administered 2022-02-18: 80 mg via INTRAVENOUS
  Filled 2022-02-18: qty 8

## 2022-02-18 MED ORDER — HYDRALAZINE HCL 20 MG/ML IJ SOLN: 5.0000 mg | INTRAMUSCULAR | Status: DC | PRN

## 2022-02-18 MED ORDER — SERTRALINE HCL 50 MG PO TABS
25.0000 mg | ORAL_TABLET | Freq: Every day | ORAL | Status: DC
  Administered 2022-02-19 – 2022-02-20 (×2): 25 mg via ORAL
  Filled 2022-02-18 (×2): qty 1

## 2022-02-18 MED ORDER — ACETAMINOPHEN 325 MG PO TABS: 650.0000 mg | ORAL_TABLET | Freq: Four times a day (QID) | ORAL | Status: DC | PRN

## 2022-02-18 MED ORDER — SODIUM CHLORIDE 0.9 % IV SOLN
10.0000 mL/h | Freq: Once | INTRAVENOUS | Status: AC
  Administered 2022-02-18: 10 mL/h via INTRAVENOUS

## 2022-02-18 NOTE — ED Provider Notes (Signed)
[facility] Provider Note    Event Date/Time   First MD Initiated Contact with Patient 02/18/22 1146     (approximate)   History   Shortness of Breath   HPI  Rebecca Gilmore is a 81 y.o. female with past medical history of hypertension hyperlipidemia GERD chronic diastolic heart failure CKD A-fib/DVT not on anticoagulation who presents because of concern for low hemoglobin.  Patient has had recent admissions for anemia presumed due to occult GI bleed.  Patient has had colonoscopy endoscopy and PillCam endoscopy without identification of source but has required multiple units of blood.  She presents today because she saw her heart failure doctor today and she was concerned about how pale she appeared told her to come to the emergency department.  Patient does endorse lower extremity swelling and pain has been going on since Thanksgiving.  Thinks she has gained about 5 to 10 pounds.  She stopped taking her diuretic about 3 days ago presents because she says it does not work.  Patient denies any black stool or blood in her stool.  Says she has a black stool in the past but it does not look like that now.  Does feel winded with exertion but denies chest pain.     Past Medical History:  Diagnosis Date   Depression    HBP (high blood pressure)    History of bladder problems    Memory loss    Muscle pain    Osteoporosis    Reflux    Sinus congestion    Swelling    B/L FEET AND LEGS    Patient Active Problem List   Diagnosis Date Noted   Chronic kidney disease, stage 3b ([place]) 01/27/2022   Hypokalemia 01/27/2022   Acute on chronic blood loss anemia 01/27/2022   Chronic kidney disease 12/23/2021   Anemia    GI bleeding 12/11/2021   Temporal arteritis ([place]) 12/11/2021   Acute blood loss anemia 12/11/2021   Depression    HLD (hyperlipidemia)    Iron deficiency anemia    Chronic diastolic CHF (congestive heart failure) (HCC)    Acute renal failure  superimposed on stage 3b chronic kidney disease (HCC)    Myocardial injury    Atrial fibrillation, chronic (HCC)    Femoral artery occlusion, 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   Atrial fibrillation and flutter ([place]) 04/18/2021   Acute heart failure ([place]) 03/05/2021   Acute respiratory failure with hypoxia ([place]) 03/05/2021   Influenza A 03/05/2021   NSTEMI (non-ST elevated myocardial infarction) ([place]) 03/05/2021   Bradycardia 06/27/2019   Peripheral edema 01/18/2018   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 --      Excl. in [place]? --     Most recent vital signs: Vitals:   02/18/22 1143 02/18/22 1200  BP: (!) 116/49 (!) 139/43  Pulse: 67 64  Resp: 18 19  Temp: 97.7 F (36.5 C)   SpO2: 95% 100%     General: Awake, no distress.  Patient is pale CV:  Good peripheral perfusion. 2+ pitting edema in the bilateral lower extremities with venous stasis changes Resp:  Patient becomes winded with movement in the stretcher but lungs are clear Abd:  No distention.  Neuro:             Awake, Alert, Oriented x 3  Other:  Brown stool on rectal exam   ED Results / Procedures / Treatments  Labs (all labs ordered are listed, but only abnormal results are displayed) Labs Reviewed  COMPREHENSIVE METABOLIC PANEL - Abnormal; Notable for the following components:      Result Value   Potassium 3.3 (*)    Glucose, Bld 109 (*)    BUN 29 (*)     Creatinine, Ser 1.69 (*)    Calcium 8.8 (*)    Total Protein 6.4 (*)    Albumin 3.1 (*)    GFR, Estimated 30 (*)    All other components within normal limits  BRAIN NATRIURETIC PEPTIDE - Abnormal; Notable for the following components:   B Natriuretic Peptide 287.3 (*)    All other components within normal limits  CBC WITH DIFFERENTIAL/PLATELET - Abnormal; Notable for the following components:   RBC 2.19 (*)    Hemoglobin 6.5 (*)    HCT 21.6 (*)    All other components within normal limits  TYPE AND SCREEN  PREPARE RBC (CROSSMATCH)     EKG EKG reviewed interpreted myself shows normal sinus rhythm right bundle branch block and left anterior fascicular block ST depression lateral precordial leads and inferior leads   RADIOLOGY I reviewed and interpreted the CXR which does not show any acute cardiopulmonary process    PROCEDURES:  Critical Care performed: Yes, see critical care procedure note(s)  .Critical Care  Performed by: Rada Hay, MD Authorized by: Rada Hay, MD   Critical care provider statement:    Critical care time (minutes):  30   Critical care was time spent personally by me on the following activities:  Development of treatment plan with patient or surrogate, discussions with consultants, evaluation of patient's response to treatment, examination of patient, ordering and review of laboratory studies, ordering and review of radiographic studies, ordering and performing treatments and interventions, pulse oximetry, re-evaluation of patient's condition and review of old charts   The patient is on the cardiac monitor to evaluate for evidence of arrhythmia and/or significant heart rate changes.   MEDICATIONS ORDERED IN ED: Medications  pantoprazole (PROTONIX) 80 mg /NS 100 mL IVPB (80 mg Intravenous New Bag/Given 02/18/22 1324)  0.9 %  sodium chloride infusion (0 mL/hr Intravenous Stopped 02/18/22 1325)  furosemide (LASIX) injection 80 mg (80  mg Intravenous Given 02/18/22 1257)  potassium chloride SA (KLOR-CON M) CR tablet 40 mEq (40 mEq Oral Given 02/18/22 1310)     IMPRESSION / MDM / ASSESSMENT AND PLAN / ED COURSE  I reviewed the triage vital signs and the nursing notes.                              Patient's presentation is most consistent  with acute presentation with potential threat to life or bodily function.  Differential diagnosis includes, but is not limited to, symptomatic anemia, CHF, GI bleed  Patient is a 81 year old female with history of anemia secondary to occult GI bleeding 6 diastolic heart failure who presents because her heart failure doctor thought she looked pale.  Patient has had increasing lower extremity edema dyspnea on exertion.  She does not have any blood in her stool that she sees or black stool.  Your diastolic pressure is somewhat low but her vitals are otherwise reassuring.  She does look somewhat dyspneic when she is moving around in the bed and has pitting edema in the lower extremities.  Chest x-ray read as mild pulmonary vascular congestion.  Hemoglobin is 6.5.  Will give a unit of blood.  On rectal exam she has brown stool.  Patient has had extensive workup for GI bleed in the past including colonoscopy endoscopy and pill camera endoscopy.  Will give a dose of Protonix.       FINAL CLINICAL IMPRESSION(S) / ED DIAGNOSES   Final diagnoses:  Symptomatic anemia     Rx / DC Orders   ED Discharge Orders     None        Note:  This document was prepared using Dragon voice recognition software and may include unintentional dictation errors.   Rada Hay, MD 02/18/22 [phone]

## 2022-02-18 NOTE — ED Notes (Signed)
First nurse note:  Pt here from [facility] clinic at [facility] with c/o of pallor, pt has HX of anemia, pt unsure of bleeding at this time. Pt also has c/o of SOB.

## 2022-02-18 NOTE — ED Notes (Signed)
BP is 125/37 map of 61, MD aware.  Pt denies feeling lightheaded or dizzy.

## 2022-02-18 NOTE — Progress Notes (Signed)
Patient arrived to unit in stable condition. Patient alert and oriented. States uses walker at baseline, but is currently very weak. Puriwick in place. Skin assessment completed by bedside RN Cayley.

## 2022-02-18 NOTE — Plan of Care (Signed)

## 2022-02-18 NOTE — H&P (Signed)
History and Physical    Rebecca Gilmore EVO:[id number] DOB: 29-Apr-1940 DOA: 02/18/2022  Referring MD/NP/PA:   PCP: Leonides Sake, MD   Patient coming from:  The patient is coming from home.    Chief Complaint: SOB  HPI: Rebecca Gilmore is a 81 y.o. female with medical history significant of HTN, HLD,, GERD, depression, dCHF, CKD-3B, atrial fibrillation and DVT not on anticoagulants, PVD, iron deficiency anemia, temporal arteritis on lowe dose of prednisone, recent admissions due to GIB with extensive workup without clear source identified, who presents with SOB.  Patient was admitted several times due to anemia, with most recent admission from 11/21 - 11/23 due to possible GIB and worsening anemia. Pt had capsule endoscopy on 01/28/2022, which was suboptimal study and negative for acute findings. Pt had blood transfusion, with hemoglobin increased from 6.2 to 8.6. pt states that she has worsening shortness of breath in the past several days.  Patient has mild dry cough, no chest pain, fever or chills.  Patient denies nausea, vomiting, diarrhea or abdominal pain. Patient took her last dose of Plavix yesterday.  She is on iron supplement with dark stool which is not as dark as before per patient. Pt also has worsening bilateral leg edema.  Patient was seen by cardiologist in office today, and found to to be very pale, sent to ED for further evaluation and treatment.   EDG on 10/6: - Normal duodenal bulb, first portion of the duodenum and second portion of the duodenum. - Z-line regular. - Esophagogastric landmarks identified. - 2 cm hiatal hernia. - Gastritis. Biopsied. - Gastric stenosis was found at the pylorus.   Colonoscopy on 10/7: - Preparation of the colon was fair. - The entire examined colon is normal on direct and retroflexion views. - No specimens collected.  Capsule endoscopy on 01/28/2022 Study is suboptimal due to small bowel images captured only for 25 minutes, rest of  the images are unremarkable.  There was no active bleeding on the study.   Data reviewed independently and ED Course: pt was found to have Hgb 6.5 (7.9 on 01/29/22), WBC 5.5, BNP 287, stable renal function, potassium 3.3, temperature normal, blood pressure 139/43, heart rate 64, RR 19, oxygen saturation 95% on room air.  Chest x-ray showed cardiomegaly and vascular congestion.  Patient is placed on head of bed for observation.   EKG: I have personally reviewed.  Sinus rhythm, QTc 505, bifascicular block.   Review of Systems:   General: no fevers, chills, no body weight gain, has fatigue HEENT: no blurry vision, hearing changes or sore throat Respiratory: has dyspnea, coughing CV: no chest pain, no palpitations GI: no nausea, vomiting, abdominal pain, diarrhea, constipation GU: no dysuria, burning on urination, increased urinary frequency, hematuria  Ext: has leg edema Neuro: no unilateral weakness, numbness, or tingling, no vision change or hearing loss Skin: no rash, no skin tear. MSK: No muscle spasm, no deformity, no limitation of range of movement in spin Heme: No easy bruising.  Travel history: No recent long distant travel.   Allergy:  Allergies  Allergen Reactions   Morphine Hives and Itching    Other Reaction(s): Confusion   Morphine And Related Itching   Kenalog [Triamcinolone Acetonide] Other (See Comments)    FLUSH IN FACE   Penicillins Other (See Comments)    Has patient had a PCN reaction causing immediate rash, facial/tongue/throat swelling, SOB or lightheadedness with hypotension: Unknown Has patient had a PCN reaction causing severe rash involving mucus  membranes or skin necrosis: Unknown Has patient had a PCN reaction that required hospitalization:No Has patient had a PCN reaction occurring within the last 10 years: No If all of the above answers are "NO", then may proceed with Cephalosporin use.     Past Medical History:  Diagnosis Date   Depression     HBP (high blood pressure)    History of bladder problems    Memory loss    Muscle pain    Osteoporosis    Reflux    Sinus congestion    Swelling    B/L FEET AND LEGS    Past Surgical History:  Procedure Laterality Date   ARTERY BIOPSY Bilateral 12/01/2016   Procedure: BIOPSY TEMPORAL ARTERY;  Surgeon: Conrad Caribou, MD;  Location: [facility];  Service: Vascular;  Laterality: Bilateral;   CATARACT SURGERY     COLONOSCOPY WITH PROPOFOL N/A 12/13/2021   Procedure: COLONOSCOPY WITH PROPOFOL;  Surgeon: Jonathon Bellows, MD;  Location: [facility] ENDOSCOPY;  Service: Gastroenterology;  Laterality: N/A;   ESOPHAGOGASTRODUODENOSCOPY (EGD) WITH PROPOFOL N/A 12/12/2021   Procedure: ESOPHAGOGASTRODUODENOSCOPY (EGD) WITH PROPOFOL;  Surgeon: [name], DO;  Location: [facility] ENDOSCOPY;  Service: Gastroenterology;  Laterality: N/A;   EYELID SURGERY Bilateral    GIVENS CAPSULE STUDY N/A 12/15/2021   Procedure: GIVENS CAPSULE STUDY;  Surgeon: [name], DO;  Location: [facility] ENDOSCOPY;  Service: Gastroenterology;  Laterality: N/A;   GIVENS CAPSULE STUDY N/A 01/28/2022   Procedure: GIVENS CAPSULE STUDY;  Surgeon: [name], MD;  Location: [facility] ENDOSCOPY;  Service: Gastroenterology;  Laterality: N/A;   GROWTH ON FACE     KNEE SURGERY Right    THROMBECTOMY FEMORAL ARTERY Left 11/16/2021   Procedure: THROMBECTOMY FEMORAL ARTERY;  Surgeon: Conrad [place], MD;  Location: [facility] ORS;  Service: Vascular;  Laterality: Left;    Social History:  reports that she quit smoking about 29 years ago. Her smoking use included cigarettes. She has quit using smokeless tobacco.  Her smokeless tobacco use included snuff and chew. She reports that she does not drink alcohol and does not use drugs.  Family History:  Family History  Problem Relation Age of Onset   Heart disease Mother    AAA (abdominal aortic aneurysm) Mother    Heart disease Father      Prior to Admission medications   Medication Sig Start  Date End Date Taking? Authorizing Provider  acetaminophen (TYLENOL) 325 MG tablet Take 2 tablets (650 mg total) by mouth every 6 (six) hours as needed for mild pain or fever. 12/15/21   Emeterio Reeve, DO  amitriptyline (ELAVIL) 25 MG tablet Take 25 mg by mouth at bedtime. 07/06/21   [provider]  cholecalciferol (VITAMIN D3) 25 MCG (1000 UT) tablet Take 1,000 Units by mouth daily.    [provider]  clopidogrel (PLAVIX) 75 MG tablet Take 1 tablet (75 mg total) by mouth every other day. 01/29/22   Shelly Coss, MD  cyanocobalamin (VITAMIN B12) 1000 MCG tablet Take 1,000 mcg by mouth.    [provider]  Ferrous Sulfate (IRON) 325 (65 Fe) MG TABS Take 1 tablet by mouth daily. 09/07/21   [provider]  loratadine (CLARITIN) 10 MG tablet Take 10 mg by mouth daily as needed.    [provider]  metoprolol succinate (TOPROL-XL) 25 MG 24 hr tablet Take 0.5 tablets (12.5 mg total) by mouth daily. Discuss with your doctor if u need to continue this med 01/29/22   Shelly Coss, MD  pantoprazole (PROTONIX) 40 MG tablet Take 40 mg by mouth every morning. 10/01/21   [provider]  potassium chloride SA (KLOR-CON M) 20 MEQ tablet Take 1 tablet (20 mEq total) by mouth daily. 01/29/22   Shelly Coss, MD  predniSONE (DELTASONE) 1 MG tablet Take 1 mg by mouth daily. 09/23/21   [provider]  pregabalin (LYRICA) 75 MG capsule Take 1 capsule (75 mg total) by mouth daily. 11/25/21   Kris Hartmann, NP  rosuvastatin (CRESTOR) 5 MG tablet Take 5 mg by mouth daily. 04/06/19   [provider]  sertraline (ZOLOFT) 50 MG tablet Take 25 mg by mouth daily. 10/14/17   [provider]  torsemide (DEMADEX) 100 MG tablet Take 0.5 tablets (50 mg total) by mouth daily. 01/29/22   Shelly Coss, MD    Physical Exam: Vitals:   02/18/22 1400 02/18/22 1415 02/18/22 1435 02/18/22 1438  BP:  (!) 123/47  (!) 130/48  Pulse:  66  65  Resp:   18  20  Temp: (!) 97.3 F (36.3 C) (!) 97.3 F (36.3 C) (!) 97.5 F (36.4 C) (!) 97.5 F (36.4 C)  TempSrc: Oral  Oral Oral  SpO2:  96%    Weight:      Height:       General: Not in acute distress. Pale looking. HEENT:       Eyes: PERRL, EOMI, no scleral icterus.       ENT: No discharge from the ears and nose, no pharynx injection, no tonsillar enlargement.        Neck: Difficult to assess JVD due to morbid obesity, no bruit, no mass felt. Heme: No neck lymph node enlargement. Cardiac: S1/S2, RRR, No murmurs, No gallops or rubs. Respiratory: No rales, wheezing, rhonchi or rubs. GI: Soft, nondistended, nontender, no rebound pain, no organomegaly, BS present. GU: No hematuria Ext: 2+ pitting leg edema bilaterally. 1+DP/PT pulse bilaterally. Musculoskeletal: No joint deformities, No joint redness or warmth, no limitation of ROM in spin. Skin: No rashes.  Neuro: Alert, oriented X3, cranial nerves II-XII grossly intact, moves all extremities normally.  Psych: Patient is not psychotic, no suicidal or hemocidal ideation.  Labs on Admission: I have personally reviewed following labs and imaging studies  CBC: Recent Labs  Lab 02/18/22 1203  WBC 5.5  NEUTROABS 3.7  HGB 6.5*  HCT 21.6*  MCV 98.6  PLT 694   Basic Metabolic Panel: Recent Labs  Lab 02/18/22 1203  NA 139  K 3.3*  CL 104  CO2 26  GLUCOSE 109*  BUN 29*  CREATININE 1.69*  CALCIUM 8.8*  MG 2.1   GFR: Estimated Creatinine Clearance: 30.7 mL/min (A) (by C-G formula based on SCr of 1.69 mg/dL (H)). Liver Function Tests: Recent Labs  Lab 02/18/22 1203  AST 19  ALT 8  ALKPHOS 49  BILITOT 0.9  PROT 6.4*  ALBUMIN 3.1*   No results for input(s): "LIPASE", "AMYLASE" in the last 168 hours. No results for input(s): "AMMONIA" in the last 168 hours. Coagulation Profile: Recent Labs  Lab 02/18/22 1203  INR 1.2   Cardiac Enzymes: No results for input(s): "CKTOTAL", "CKMB", "CKMBINDEX", "TROPONINI" in the  last 168 hours. BNP (last 3 results) No results for input(s): "PROBNP" in the last 8760 hours. HbA1C: No results for input(s): "HGBA1C" in the last 72 hours. CBG: No results for input(s): "GLUCAP" in the last 168 hours. Lipid Profile: No results for input(s): "CHOL", "HDL", "LDLCALC", "TRIG", "CHOLHDL", "LDLDIRECT" in the last 72  hours. Thyroid Function Tests: No results for input(s): "TSH", "T4TOTAL", "FREET4", "T3FREE", "THYROIDAB" in the last 72 hours. Anemia Panel: No results for input(s): "VITAMINB12", "FOLATE", "FERRITIN", "TIBC", "IRON", "RETICCTPCT" in the last 72 hours. Urine analysis:    Component Value Date/Time   COLORURINE YELLOW (A) 11/19/2021 1223   APPEARANCEUR CLOUDY (A) 11/19/2021 1223   LABSPEC 1.015 11/19/2021 1223   PHURINE 5.0 11/19/2021 1223   GLUCOSEU NEGATIVE 11/19/2021 1223   HGBUR SMALL (A) 11/19/2021 1223   BILIRUBINUR NEGATIVE 11/19/2021 1223   KETONESUR NEGATIVE 11/19/2021 1223   PROTEINUR 30 (A) 11/19/2021 1223   NITRITE NEGATIVE 11/19/2021 1223   LEUKOCYTESUR LARGE (A) 11/19/2021 1223   Sepsis Labs: _0 (procalcitonin:4,lacticidven:4) )No results found for this or any previous visit (from the past 240 hour(s)).   Radiological Exams on Admission: DG Chest 2 View  Result Date: 02/18/2022 CLINICAL DATA:  Choose breath EXAM: CHEST - 2 VIEW COMPARISON:  12/11/2021 FINDINGS: Stable elevation of the right diaphragm. Bilateral mild interstitial thickening. No focal consolidation. No pleural effusion or pneumothorax. Stable cardiomegaly. Thoracic aortic atherosclerosis. No acute osseous abnormality. IMPRESSION: 1. Cardiomegaly with mild pulmonary vascular congestion. Electronically Signed   By: Kathreen Devoid M.D.   On: 02/18/2022 12:46      Assessment/Plan Principal Problem:   Symptomatic anemia Active Problems:   Iron deficiency anemia   Acute on chronic diastolic (congestive) heart failure (HCC)   Atrial fibrillation, chronic (HCC)    Chronic kidney disease, stage 3b (HCC)   Hypokalemia   HLD (hyperlipidemia)   Depression   Temporal arteritis (HCC)   Morbid obesity with BMI of 40.0-44.9, adult (HCC)   Assessment and Plan:  Symptomatic anemia and iron deficiency anemia: Hgb 7.9 --> 6.5.  Patient had multiple admission due to worsening anemia 2/2 possible GI bleeding.  Patient had extensive workup without clear source of bleeding identified. Pt is on Plavix. Still suspecting chronic GI blood loss.  Consulted Dr. Alice Reichert of GI, who recommended hematologist consult.  Consulted Dr. Rogue Bussing of hematology.  - will admit to tele bed as inpt - transfuse 1 units of blood now - Start IV pantoprazole 40 mg bid - Zofran IV for nausea - Avoid NSAIDs and SQ heparin - Maintain IV access (2 large bore IVs if possible). - Monitor closely and follow q6h cbc, transfuse as necessary, if Hgb<7.0  -continue iron supplement - [facility]: INR, PTT and type screen - Hold Plavix   Acute on chronic diastolic CHF (congestive heart failure) ([place]): 2D echo on 11/17/2021 showed EF of 80-22% with diastolic dysfunction grade 2.  Patient has 2+ leg edema.  BNP 287, vascular congestion on chest x-ray, clinically consistent with CHF exacerbation. -80 mg of Lasix IV was given in ED -Continue Lasix 60 mg bid   Atrial fibrillation, chronic ([place]): Heart rate 64.  Patient is not taking anticoagulants except for Plavix. pt is not taking Metoprolol currently -Telemetry monitoring    Chronic kidney disease, stage 3b [facility]): Recent baseline creatinine 1.4-1.7.  Her creatinine is 1.69 and BUN 29, GFDR 30. Close to baseline.  -Monitor renal functio by BMP   Hypokalemia: K 3.3 -Repleted potassium -Check magnesium level    HLD (hyperlipidemia) -Crestor   Depression: -continue home meds   Temporal arteritis -Continue prednisone 5 mg daily  Morbid obesity with BMI of 40.0-44.9, adult (Gross): BMI 41.98, body weight 107.5 kg -Healthy diet and  exercise -Encourage losing weight    DVT ppx: SCD  Code Status: Full code  Family Communication: Yes, patient's  daughter  at bed side.      Disposition Plan:  Anticipate discharge back to previous environment  Consults called:  Dr. Alice Reichert of GI  Admission status and Level of care: Telemetry Medical:  as inpt       Dispo: The patient is from: Home              Anticipated d/c is to: Home              Anticipated d/c date is: 2 days              Patient currently is not medically stable to d/c.    Severity of Illness:  The appropriate patient status for this patient is INPATIENT. Inpatient status is judged to be reasonable and necessary in order to provide the required intensity of service to ensure the patient's safety. The patient's presenting symptoms, physical exam findings, and initial radiographic and laboratory data in the context of their chronic comorbidities is felt to place them at high risk for further clinical deterioration. Furthermore, it is not anticipated that the patient will be medically stable for discharge from the hospital within 2 midnights of admission.   * I certify that at the point of admission it is my clinical judgment that the patient will require inpatient hospital care spanning beyond 2 midnights from the point of admission due to high intensity of service, high risk for further deterioration and high frequency of surveillance required.*       Date of Service 02/18/2022    Ivor Costa [facility]   If 7PM-7AM, please contact night-coverage [email or web address] 02/18/2022, 3:24 PM

## 2022-02-18 NOTE — ED Triage Notes (Signed)
Pt here with SOB that has been ongoing for a while. Pt also has a low hemoglobin. PT stable in triage.

## 2022-02-18 NOTE — ED Notes (Signed)
Assessed both of pt's arms for IV access. Nothing fit for a 20g. Will place 22g or use [place] if necessary.

## 2022-02-18 NOTE — Consult Note (Signed)
GI Inpatient Consult Note  Reason for Consult: Symptomatic anemia    Attending Requesting Consult: Dr. Ivor Costa  History of Present Illness: Rebecca Gilmore is a 81 y.o. female seen for evaluation of symptomatic anemia at the request of admitting hospitalist - Dr. Ivor Costa. Patient has a PMH of HTN, HLD, GERD, morbid obesity (BMI 42), dCHF, CKD IIIb, MGUS, giant cell arteritis, PAF not on chronic anticoagulation, hx of DVT, osteoporosis, PVD, and depression. She presented to the [facility] ED this morning after having outpatient routine follow-up with cardiology team and told she looked really pale and needed to come to the ED. She also complained of weight gain and worsening bilateral lower extremity edema. Upon presentation to the ED, all vital signs were stable. Labs were significant for hemoglobin 6.5, hematocrit 21.6, potassium 3.3, BUN 29, serum creatinine 1.69, albumin 3.1, and BNP 287.3. Hemoglobin was 7.9 three weeks ago. Per ED physician, she had brown stool on rectal exam. Chest x-ray showed cardiomegaly and pulmonary vascular congestion. She was admitted for observation and GI team consulted for further evaluation and management.   Patient seen and examined in ED room this afternoon. Daughter present in room. Of note, patient has been worked up extensively by GI over the past two months for concerns of symptomatic anemia. She was most recently admitted 11/21 - 11/23 last month for worsening anemia and concerns of occult GI bleeding. She had video capsule endoscopy performed 11/22 which showed suboptimal study due to small bowel images being only captured for 25 minutes. However, no active bleeding seen on the study. She had endoscopy and colonoscopy performed on 10/6 and 10/7, respectively, last month which showed no etiologies for anemia. Hemoglobin during admission in October 2023 showed trough of 4.9. She denies seeing any overt bloody or tarry stools. She reports stools have been green to  dark brown over the past few months. She does take daily iron therapy. She denies any abdominal pain or abdominal cramping. She does endorse worsening bilateral lower extremity edema and reports she is having a 5-10-lb weight gain over the past few weeks. She does have fatigue at baseline. She denies any concerning UGI symptoms such as nausea, vomiting, dysphagia, odynophagia, GERD, or early satiety.    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 No specimens collected  VCE 01/28/2022 - study suboptimal due to small bowel images captured only for 25 minutes, rest of images unremarkable. No active bleeding seen.  Past Medical History:  Past Medical History:  Diagnosis Date   Depression    HBP (high blood pressure)    History of bladder problems    Memory loss    Muscle pain    Osteoporosis    Reflux    Sinus congestion    Swelling    B/L FEET AND LEGS    Problem List: Patient Active Problem List   Diagnosis Date Noted   Symptomatic anemia 02/18/2022   Morbid obesity with BMI of 40.0-44.9, adult ([place]) 02/18/2022   Acute on chronic diastolic (congestive) heart failure ([place]) 02/18/2022   Chronic kidney disease, stage 3b ([place]) 01/27/2022   Hypokalemia 01/27/2022   Acute on chronic blood loss anemia 01/27/2022   Chronic kidney disease 12/23/2021   Anemia    GI bleeding 12/11/2021   Temporal arteritis ([place]) 12/11/2021   Acute blood  loss anemia 12/11/2021   Depression    HLD (hyperlipidemia)    Iron deficiency anemia    Chronic diastolic CHF (congestive heart failure) (HCC)    Acute renal failure superimposed on stage 3b chronic kidney disease (HCC)    Myocardial injury    Atrial fibrillation, chronic (HCC)    Femoral artery occlusion, 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   Atrial fibrillation and flutter ([place]) 04/18/2021    Acute heart failure ([place]) 03/05/2021   Acute respiratory failure with hypoxia ([place]) 03/05/2021   Influenza A 03/05/2021   NSTEMI (non-ST elevated myocardial infarction) ([place]) 03/05/2021   Bradycardia 06/27/2019   Peripheral edema 01/18/2018   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   SOB (shortness of breath) on exertion 09/21/2017   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    Past Surgical History: Past Surgical History:  Procedure Laterality Date   ARTERY BIOPSY Bilateral 12/01/2016   Procedure: BIOPSY TEMPORAL ARTERY;  Surgeon: Conrad Woolstock, MD;  Location: [facility] OR;  Service: Vascular;  Laterality: Bilateral;   CATARACT SURGERY     COLONOSCOPY WITH PROPOFOL N/A 12/13/2021   Procedure: COLONOSCOPY WITH PROPOFOL;  Surgeon: Jonathon Bellows, MD;  Location: [facility] ENDOSCOPY;  Service: Gastroenterology;  Laterality: N/A;   ESOPHAGOGASTRODUODENOSCOPY (EGD) WITH PROPOFOL N/A 12/12/2021   Procedure: ESOPHAGOGASTRODUODENOSCOPY (EGD) WITH PROPOFOL;  Surgeon: [name], DO;  Location: [facility] ENDOSCOPY;  Service: Gastroenterology;  Laterality: N/A;   EYELID SURGERY Bilateral    GIVENS CAPSULE STUDY N/A 12/15/2021   Procedure: GIVENS CAPSULE STUDY;  Surgeon: [name], DO;  Location: [facility] ENDOSCOPY;  Service: Gastroenterology;  Laterality: N/A;   GIVENS CAPSULE STUDY N/A 01/28/2022   Procedure: GIVENS CAPSULE STUDY;  Surgeon: [name], MD;  Location[facility] ENDOSCOPY;  Service: Gastroenterology;  Laterality: N/A;   GROWTH ON FACE     KNEE SURGERY Right    THROMBECTOMY FEMORAL ARTERY Left 11/16/2021   Procedure: THROMBECTOMY FEMORAL ARTERY;  Surgeon: Conrad Hatteras, MD;  Location: [facility] ORS;  Service: Vascular;  Laterality: Left;    Allergies: Allergies  Allergen Reactions   Morphine Hives and Itching    Other Reaction(s): Confusion   Morphine And Related Itching   Kenalog [Triamcinolone Acetonide] Other (See Comments)    FLUSH IN FACE   Penicillins Other (See  Comments)    Has patient had a PCN reaction causing immediate rash, facial/tongue/throat swelling, SOB or lightheadedness with hypotension: Unknown Has patient had a PCN reaction causing severe rash involving mucus membranes or skin necrosis: Unknown Has patient had a PCN reaction that required hospitalization:No Has patient had a PCN reaction occurring within the last 10 years: No If all of the above answers are "NO", then may proceed with Cephalosporin use.     Home Medications: (Not in a hospital admission)  Home medication reconciliation was completed with the patient.   Scheduled Inpatient Medications:    [START ON 02/19/2022] cholecalciferol  1,000 Units Oral Daily   [START ON 02/19/2022] cyanocobalamin  1,000 mcg Oral Daily   [START ON 02/19/2022] ferrous sulfate  325 mg Oral Q breakfast   furosemide  60 mg Intravenous Q12H   pantoprazole (PROTONIX) IV  40 mg Intravenous Q12H   predniSONE  5 mg Oral Daily   [START ON 02/19/2022] pregabalin  75 mg Oral Daily   rosuvastatin  5 mg Oral Daily   [START ON 02/19/2022] sertraline  25 mg Oral Daily    Continuous Inpatient Infusions:    PRN Inpatient Medications:  acetaminophen, albuterol, amitriptyline, diphenhydrAMINE, hydrALAZINE, loratadine  Family History: family history includes AAA (abdominal aortic aneurysm) in her mother; Heart disease in her father and mother.  The patient's family history is negative for inflammatory bowel disorders, GI malignancy, or solid organ transplantation.  Social History:   reports that she quit smoking about 29 years ago. Her smoking use included cigarettes. She has quit using smokeless tobacco.  Her smokeless tobacco use included snuff and chew. She reports that she does not drink alcohol and does not use drugs. The patient denies ETOH, tobacco, or drug use.   Review of Systems: Constitutional: + weight gain  Eyes: No changes in vision. ENT: No oral lesions, sore throat.  GI: see HPI.   Heme/Lymph: No easy bruising.  CV: No chest pain.  GU: No hematuria.  Integumentary: No rashes.  Neuro: No headaches.  Psych: No depression/anxiety.  Endocrine: No heat/cold intolerance.  Allergic/Immunologic: No urticaria.  Resp: No cough, SOB.  Musculoskeletal: No joint swelling.    Physical Examination: BP (!) 120/52   Pulse 65   Temp (!) 97.5 F (36.4 C) (Oral)   Resp 19   Ht _0  (1.6 m)   Wt 107.5 kg   SpO2 100%   BMI 41.98 kg/m  Gen: NAD, alert and oriented x 4 HEENT: PEERLA, EOMI, Neck: supple, no JVD or thyromegaly Chest: CTA bilaterally, no wheezes, crackles, or other adventitious sounds CV: RRR, no m/g/c/r Abd: soft, NT, ND, +BS in all four quadrants; no HSM, guarding, ridigity, or rebound tenderness Ext: 2+ bilateral lower extremity edema  Skin: no rash or lesions noted Lymph: no LAD  Data: Lab Results  Component Value Date   WBC 5.5 02/18/2022   HGB 6.5 (L) 02/18/2022   HCT 21.6 (L) 02/18/2022   MCV 98.6 02/18/2022   PLT 210 02/18/2022   Recent Labs  Lab 02/18/22 1203  HGB 6.5*   Lab Results  Component Value Date   NA 139 02/18/2022   K 3.3 (L) 02/18/2022   CL 104 02/18/2022   CO2 26 02/18/2022   BUN 29 (H) 02/18/2022   CREATININE 1.69 (H) 02/18/2022   Lab Results  Component Value Date   ALT 8 02/18/2022   AST 19 02/18/2022   ALKPHOS 49 02/18/2022   BILITOT 0.9 02/18/2022   Recent Labs  Lab 02/18/22 1203  APTT 35  INR 1.2    Assessment/Plan:  81 y/o Caucasian female with a PMH of HTN, HLD, GERD, morbid obesity (BMI 42), dCHF, CKD IIIb, MGUS, giant cell arteritis, PAF not on chronic anticoagulation, hx of DVT, osteoporosis, PVD, and depression presented to the [facility] ED this morning for chief complaint of symptomatic anemia and worsening bilateral lower extremity edema  Symptomatic, normocytic anemia - hemoglobin dropped from 7.9 --> 6.5 this morning, unexplained IDA despite thorough GI work-up with bidirectional endoscopies + VCE  within the last two months. No overt GI bleeding.   CHF exacerbation with known hx dCHF  Chronic atrial fibrillation - NOT on chronic anticoagulation  PVD on Plavix - Plavix now on hold  CKD Stage IIIb  Temporal arteritis  Morbid obesity - BMI 42  Recommendations:  - Agree with transfusion 1 unit pRBCs this afternoon - Continue to monitor H&H closely. Transfuse for Hgb <7.0.  - No signs of overt GI bleeding or active GI bleeding at present time - Discussed potential repeat bidirectional endoscopies with patient and daughter today and they decline these at present time which is reasonable given recent negative EGD/CSY/VCE.  - Recommend consultation with Hematology to  explore other potential causes of anemia (MDS, autoimmune hemolytic anemia, etc).  - No plans for GI interventions at this time - Heart healthy diet for now - Continue correction of electrolytes per primary team - Continue to hold Plavix for now - Continue supportive care and management of CHF exacerbation per primary team - GI will sign off at this time and follow peripherally  - If there are signs of GI bleeding, please call Dr. Alice Reichert.   Thank you for the consult. Please call with questions or concerns.  Geanie Kenning, PA-C [facility] [phone]

## 2022-02-19 DIAGNOSIS — D649 Anemia, unspecified: Secondary | ICD-10-CM | POA: Diagnosis not present

## 2022-02-19 LAB — CBC WITH DIFFERENTIAL/PLATELET
Abs Immature Granulocytes: 0.04 10*3/uL (ref 0.00–0.07)
Basophils Absolute: 0 10*3/uL (ref 0.0–0.1)
Basophils Relative: 1 %
Eosinophils Absolute: 0 10*3/uL (ref 0.0–0.5)
Eosinophils Relative: 1 %
HCT: 24.7 % — ABNORMAL LOW (ref 36.0–46.0)
Hemoglobin: 7.8 g/dL — ABNORMAL LOW (ref 12.0–15.0)
Immature Granulocytes: 1 %
Lymphocytes Relative: 21 %
Lymphs Abs: 1.3 10*3/uL (ref 0.7–4.0)
MCH: 30.4 pg (ref 26.0–34.0)
MCHC: 31.6 g/dL (ref 30.0–36.0)
MCV: 96.1 fL (ref 80.0–100.0)
Monocytes Absolute: 0.6 10*3/uL (ref 0.1–1.0)
Monocytes Relative: 10 %
Neutro Abs: 4.2 10*3/uL (ref 1.7–7.7)
Neutrophils Relative %: 66 %
Platelets: 209 10*3/uL (ref 150–400)
RBC: 2.57 MIL/uL — ABNORMAL LOW (ref 3.87–5.11)
RDW: 14.4 % (ref 11.5–15.5)
WBC: 6.2 10*3/uL (ref 4.0–10.5)
nRBC: 0 % (ref 0.0–0.2)

## 2022-02-19 LAB — CBC
HCT: 26.3 % — ABNORMAL LOW (ref 36.0–46.0)
HCT: 25.5 % — ABNORMAL LOW (ref 36.0–46.0)
HCT: 26.7 % — ABNORMAL LOW (ref 36.0–46.0)
Hemoglobin: 8 g/dL — ABNORMAL LOW (ref 12.0–15.0)
Hemoglobin: 7.9 g/dL — ABNORMAL LOW (ref 12.0–15.0)
Hemoglobin: 8.1 g/dL — ABNORMAL LOW (ref 12.0–15.0)
MCH: 29.2 pg (ref 26.0–34.0)
MCH: 30 pg (ref 26.0–34.0)
MCH: 29.7 pg (ref 26.0–34.0)
MCHC: 30.4 g/dL (ref 30.0–36.0)
MCHC: 31 g/dL (ref 30.0–36.0)
MCHC: 30.3 g/dL (ref 30.0–36.0)
MCV: 96 fL (ref 80.0–100.0)
MCV: 97 fL (ref 80.0–100.0)
MCV: 97.8 fL (ref 80.0–100.0)
Platelets: 215 10*3/uL (ref 150–400)
Platelets: 214 10*3/uL (ref 150–400)
Platelets: 209 10*3/uL (ref 150–400)
RBC: 2.74 MIL/uL — ABNORMAL LOW (ref 3.87–5.11)
RBC: 2.63 MIL/uL — ABNORMAL LOW (ref 3.87–5.11)
RBC: 2.73 MIL/uL — ABNORMAL LOW (ref 3.87–5.11)
RDW: 14.4 % (ref 11.5–15.5)
RDW: 14.6 % (ref 11.5–15.5)
RDW: 14.6 % (ref 11.5–15.5)
WBC: 6.2 10*3/uL (ref 4.0–10.5)
WBC: 6.4 10*3/uL (ref 4.0–10.5)
WBC: 6.7 10*3/uL (ref 4.0–10.5)
nRBC: 0 % (ref 0.0–0.2)
nRBC: 0 % (ref 0.0–0.2)
nRBC: 0 % (ref 0.0–0.2)

## 2022-02-19 LAB — BASIC METABOLIC PANEL
Anion gap: 8 (ref 5–15)
BUN: 33 mg/dL — ABNORMAL HIGH (ref 8–23)
CO2: 27 mmol/L (ref 22–32)
Calcium: 8.8 mg/dL — ABNORMAL LOW (ref 8.9–10.3)
Chloride: 106 mmol/L (ref 98–111)
Creatinine, Ser: 1.9 mg/dL — ABNORMAL HIGH (ref 0.44–1.00)
GFR, Estimated: 26 mL/min — ABNORMAL LOW (ref >60)
Glucose, Bld: 135 mg/dL — ABNORMAL HIGH (ref 70–99)
Potassium: 4.2 mmol/L (ref 3.5–5.1)
Sodium: 141 mmol/L (ref 135–145)

## 2022-02-19 LAB — RETICULOCYTES
Immature Retic Fract: 30.7 % — ABNORMAL HIGH (ref 2.3–15.9)
RBC.: 2.6 MIL/uL — ABNORMAL LOW (ref 3.87–5.11)
Retic Count, Absolute: 119.3 10*3/uL (ref 19.0–186.0)
Retic Ct Pct: 4.6 % — ABNORMAL HIGH (ref 0.4–3.1)

## 2022-02-19 LAB — TECHNOLOGIST SMEAR REVIEW: Plt Morphology: NORMAL

## 2022-02-19 LAB — LACTATE DEHYDROGENASE: LDH: 140 U/L (ref 98–192)

## 2022-02-19 LAB — MAGNESIUM: Magnesium: 2 mg/dL (ref 1.7–2.4)

## 2022-02-19 LAB — GLUCOSE, CAPILLARY: Glucose-Capillary: 111 mg/dL — ABNORMAL HIGH (ref 70–99)

## 2022-02-19 LAB — HEMOGLOBIN AND HEMATOCRIT, BLOOD
HCT: 25.4 % — ABNORMAL LOW (ref 36.0–46.0)
Hemoglobin: 7.8 g/dL — ABNORMAL LOW (ref 12.0–15.0)

## 2022-02-19 NOTE — Consult Note (Signed)
[facility] CONSULT NOTE  Patient Care Team: Hamrick, Lorin Mercy, MD as PCP - General (Family Medicine)  CHIEF COMPLAINTS/PURPOSE OF CONSULTATION: Acute Symptomatic   HISTORY OF PRESENTING ILLNESS:   Rebecca Gilmore 81 y.o.  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  Patient has had multiple admissions for anemia needing blood transfusions.  Nadir hemoglobin 2 to 3 months ago was 4.7 needing blood transfusion.  She also had extensive workup including GI workup-colonoscopy upper endoscopy, capsule-all negative.   Hematology has been consulted for further evaluation.  Review of Systems  Constitutional:  Positive for malaise/fatigue. Negative for chills, diaphoresis, fever and weight loss.  HENT:  Negative for nosebleeds and sore throat.   Eyes:  Negative for double vision.  Respiratory:  Positive for shortness of breath. Negative for cough, hemoptysis, sputum production and wheezing.   Cardiovascular:  Positive for leg swelling. Negative for chest pain, palpitations and orthopnea.  Gastrointestinal:  Negative for abdominal pain, blood in stool, constipation, diarrhea, heartburn, melena, nausea and vomiting.  Genitourinary:  Negative for dysuria, frequency and urgency.  Musculoskeletal:  Positive for back pain and joint pain.  Skin: Negative.  Negative for itching and rash.  Neurological:  Negative for dizziness, tingling, focal weakness, weakness and headaches.  Endo/Heme/Allergies:  Does not bruise/bleed easily.  Psychiatric/Behavioral:  Negative for depression. The patient is not nervous/anxious and does not have insomnia.      MEDICAL HISTORY:  Past Medical History:  Diagnosis Date   Depression    HBP (high blood pressure)    History of bladder problems    Memory loss     Muscle pain    Osteoporosis    Reflux    Sinus congestion    Swelling    B/L FEET AND LEGS    SURGICAL HISTORY: Past Surgical History:  Procedure Laterality Date   ARTERY BIOPSY Bilateral 12/01/2016   Procedure: BIOPSY TEMPORAL ARTERY;  Surgeon: Conrad Speed, MD;  Location: [facility];  Service: Vascular;  Laterality: Bilateral;   CATARACT SURGERY     COLONOSCOPY WITH PROPOFOL N/A 12/13/2021   Procedure: COLONOSCOPY WITH PROPOFOL;  Surgeon: Jonathon Bellows, MD;  Location: [facility] ENDOSCOPY;  Service: Gastroenterology;  Laterality: N/A;   ESOPHAGOGASTRODUODENOSCOPY (EGD) WITH PROPOFOL N/A 12/12/2021   Procedure: ESOPHAGOGASTRODUODENOSCOPY (EGD) WITH PROPOFOL;  Surgeon: [name], DO;  Location: [facility] ENDOSCOPY;  Service: Gastroenterology;  Laterality: N/A;   EYELID SURGERY Bilateral    GIVENS CAPSULE STUDY N/A 12/15/2021   Procedure: GIVENS CAPSULE STUDY;  Surgeon: [name], DO;  Location: [facility] ENDOSCOPY;  Service: Gastroenterology;  Laterality: N/A;   GIVENS CAPSULE STUDY N/A 01/28/2022   Procedure: GIVENS CAPSULE STUDY;  Surgeon: [name], MD;  Location: [facility] ENDOSCOPY;  Service: Gastroenterology;  Laterality: N/A;   GROWTH ON FACE     KNEE SURGERY Right    THROMBECTOMY FEMORAL ARTERY Left 11/16/2021   Procedure: THROMBECTOMY FEMORAL ARTERY;  Surgeon: Conrad Yauco, MD;  Location: [facility] ORS;  Service: Vascular;  Laterality: Left;    SOCIAL HISTORY: Social History   Socioeconomic History   Marital status: Married    Spouse name: Not on file   Number of children: Not on file   Years of education: Not on file   Highest education level: Not on file  Occupational History   Not on file  Tobacco Use  Smoking status: Former    Types: Cigarettes    Quit date: 01/12/1993    Years since quitting: 29.1   Smokeless tobacco: Former    Types: Snuff, Chew   Tobacco comments:    DATE QUIT UNKNOWN  Vaping Use   Vaping Use: Never used  Substance and Sexual Activity    Alcohol use: No   Drug use: No   Sexual activity: Not on file  Other Topics Concern   Not on file  Social History Narrative   Not on file   Social Determinants of Health   Financial Resource Strain: Not on file  Food Insecurity: No Food Insecurity (02/18/2022)   Hunger Vital Sign    Worried About Running Out of Food in the Last Year: Never true    Ran Out of Food in the Last Year: Never true  Transportation Needs: No Transportation Needs (02/18/2022)   PRAPARE - Hydrologist (Medical): No    Lack of Transportation (Non-Medical): No  Physical Activity: Not on file  Stress: Not on file  Social Connections: Not on file  Intimate Partner Violence: Not At Risk (02/18/2022)   Humiliation, Afraid, Rape, and Kick questionnaire    Fear of Current or Ex-Partner: No    Emotionally Abused: No    Physically Abused: No    Sexually Abused: No    FAMILY HISTORY: Family History  Problem Relation Age of Onset   Heart disease Mother    AAA (abdominal aortic aneurysm) Mother    Heart disease Father     ALLERGIES:  is allergic to morphine, morphine and related, kenalog [triamcinolone acetonide], and penicillins.  MEDICATIONS:  Current Facility-Administered Medications  Medication Dose Route Frequency Provider Last Rate Last Admin   acetaminophen (TYLENOL) tablet 650 mg  650 mg Oral Q6H PRN Ivor Costa, MD       albuterol (PROVENTIL) (2.5 MG/3ML) 0.083% nebulizer solution 2.5 mg  2.5 mg Nebulization Q4H PRN Alison Murray, RPH       amitriptyline (ELAVIL) tablet 25 mg  25 mg Oral QHS PRN Ivor Costa, MD       cholecalciferol (VITAMIN D3) 25 MCG (1000 UNIT) tablet 1,000 Units  1,000 Units Oral Daily Ivor Costa, MD   1,000 Units at 02/19/22 7412   cyanocobalamin (VITAMIN B12) tablet 1,000 mcg  1,000 mcg Oral Daily Ivor Costa, MD   1,000 mcg at 02/19/22 8786   diphenhydrAMINE (BENADRYL) injection 12.5 mg  12.5 mg Intravenous Q8H PRN Ivor Costa, MD       ferrous  sulfate tablet 325 mg  325 mg Oral Q breakfast Ivor Costa, MD   325 mg at 02/19/22 7672   furosemide (LASIX) injection 60 mg  60 mg Intravenous Huntley Dec, MD   60 mg at 02/19/22 0947   hydrALAZINE (APRESOLINE) injection 5 mg  5 mg Intravenous Q2H PRN Ivor Costa, MD       loratadine (CLARITIN) tablet 10 mg  10 mg Oral Daily PRN Ivor Costa, MD       pantoprazole (PROTONIX) injection 40 mg  40 mg Intravenous Q12H Ivor Costa, MD   40 mg at 02/19/22 0810   predniSONE (DELTASONE) tablet 5 mg  5 mg Oral Daily Ivor Costa, MD   5 mg at 02/18/22 1533   pregabalin (LYRICA) capsule 75 mg  75 mg Oral Daily Ivor Costa, MD   75 mg at 02/19/22 0811   rosuvastatin (CRESTOR) tablet 5 mg  5 mg Oral Daily Niu,  Soledad Gerlach, MD   5 mg at 02/19/22 3870   sertraline (ZOLOFT) tablet 25 mg  25 mg Oral Daily Ivor Costa, MD   25 mg at 02/19/22 6582     PHYSICAL EXAMINATION:  Multiple bruises noted.  No active bleeding.  Vitals:   02/19/22 1143 02/19/22 1558  BP: (!) 112/45 (!) 129/40  Pulse: (!) 56 62  Resp: 16 15  Temp: 99.1 F (37.3 C) 98.2 F (36.8 C)  SpO2: 94% 95%   Filed Weights   02/18/22 1142 02/19/22 0202  Weight: 236 lb 15.9 oz (107.5 kg) 232 lb 2.3 oz (105.3 kg)    Physical Exam Vitals and nursing note reviewed.  Constitutional:      Comments:     HENT:     Head: Normocephalic and atraumatic.     Mouth/Throat:     Mouth: Mucous membranes are moist.     Pharynx: No oropharyngeal exudate.  Eyes:     Pupils: Pupils are equal, round, and reactive to light.  Cardiovascular:     Rate and Rhythm: Normal rate and regular rhythm.  Pulmonary:     Effort: No respiratory distress.     Breath sounds: No wheezing.     Comments: Decreased breath sounds bilaterally at bases.  No wheeze or crackles Abdominal:     General: Bowel sounds are normal. There is no distension.     Palpations: Abdomen is soft. There is no mass.     Tenderness: There is no abdominal tenderness. There is no guarding or  rebound.  Musculoskeletal:        General: No tenderness. Normal range of motion.     Cervical back: Normal range of motion and neck supple.  Skin:    General: Skin is warm.  Neurological:     Mental Status: She is alert and oriented to person, place, and time.  Psychiatric:        Mood and Affect: Affect normal.        Judgment: Judgment normal.      LABORATORY DATA:  I have reviewed the data as listed Lab Results  Component Value Date   WBC 6.7 02/19/2022   HGB 8.1 (L) 02/19/2022   HCT 26.7 (L) 02/19/2022   MCV 97.8 02/19/2022   PLT 209 02/19/2022   Recent Labs    11/16/21 0430 11/17/21 0627 01/27/22 1027 01/28/22 0149 01/29/22 0619 02/18/22 1203 02/19/22 0135  NA 143   < > 141   < > 142 139 141  K 3.6   < > 2.9*   < > 4.2 3.3* 4.2  CL 106   < > 103   < > 110 104 106  CO2 29   < > 29   < > _0 GLUCOSE 122*   < > 108*   < > 99 109* 135*  BUN 35*   < > 35*   < > 26* 29* 33*  CREATININE 1.68*   < > 1.70*   < > 1.75* 1.69* 1.90*  CALCIUM 9.4   < > 9.5   < > 9.4 8.8* 8.8*  GFRNONAA 30*   < > 30*   < > 29* 30* 26*  PROT 7.2  --  7.0  --   --  6.4*  --   ALBUMIN 3.7  --  3.7  --   --  3.1*  --   AST 18  --  22  --   --  19  --  ALT 11  --  8  --   --  8  --   ALKPHOS 47  --  44  --   --  49  --   BILITOT 0.6  --  0.7  --   --  0.9  --    < > = values in this interval not displayed.    RADIOGRAPHIC STUDIES: I have personally reviewed the radiological images as listed and agreed with the findings in the report. DG Chest 2 View  Result Date: 02/18/2022 CLINICAL DATA:  Choose breath EXAM: CHEST - 2 VIEW COMPARISON:  12/11/2021 FINDINGS: Stable elevation of the right diaphragm. Bilateral mild interstitial thickening. No focal consolidation. No pleural effusion or pneumothorax. Stable cardiomegaly. Thoracic aortic atherosclerosis. No acute osseous abnormality. IMPRESSION: 1. Cardiomegaly with mild pulmonary vascular congestion. Electronically Signed   By: Kathreen Devoid M.D.   On: 02/18/2022 12:46   US Venous Img Lower Bilateral (DVT)  Result Date: 02/11/2022 CLINICAL DATA:  Bilateral lower extremity pain and edema. EXAM: BILATERAL LOWER EXTREMITY VENOUS DOPPLER ULTRASOUND TECHNIQUE: Gray-scale sonography with graded compression, as well as color Doppler and duplex ultrasound were performed to evaluate the lower extremity deep venous systems from the level of the common femoral vein and including the common femoral, femoral, profunda femoral, popliteal and calf veins including the posterior tibial, peroneal and gastrocnemius veins when visible. The superficial great saphenous vein was also interrogated. Spectral Doppler was utilized to evaluate flow at rest and with distal augmentation maneuvers in the common femoral, femoral and popliteal veins. COMPARISON:  None Available. FINDINGS: RIGHT LOWER EXTREMITY Common Femoral Vein: No evidence of thrombus. Normal compressibility, respiratory phasicity and response to augmentation. Saphenofemoral Junction: No evidence of thrombus. Normal compressibility and flow on color Doppler imaging. Profunda Femoral Vein: No evidence of thrombus. Normal compressibility and flow on color Doppler imaging. Femoral Vein: No evidence of thrombus. Normal compressibility, respiratory phasicity and response to augmentation. Popliteal Vein: No evidence of thrombus. Normal compressibility, respiratory phasicity and response to augmentation. Calf Veins: No evidence of thrombus. Normal compressibility and flow on color Doppler imaging. Superficial Great Saphenous Vein: No evidence of thrombus. Normal compressibility. Venous Reflux:  None. Other Findings: No evidence of superficial thrombophlebitis or abnormal fluid collection. LEFT LOWER EXTREMITY Common Femoral Vein: No evidence of thrombus. Normal compressibility, respiratory phasicity and response to augmentation. Saphenofemoral Junction: No evidence of thrombus. Normal compressibility and flow on  color Doppler imaging. Profunda Femoral Vein: No evidence of thrombus. Normal compressibility and flow on color Doppler imaging. Femoral Vein: No evidence of thrombus. Normal compressibility, respiratory phasicity and response to augmentation. Popliteal Vein: No evidence of thrombus. Normal compressibility, respiratory phasicity and response to augmentation. Calf Veins: No evidence of thrombus. Normal compressibility and flow on color Doppler imaging. Superficial Great Saphenous Vein: No evidence of thrombus. Normal compressibility. Venous Reflux:  None. Other Findings: No evidence of superficial thrombophlebitis. There is a focal fluid collection within the subcutaneous soft tissues of the left groin measuring up to approximately 3.8 x 1.5 x 2.9 cm. This likely relates to some residual postoperative fluid from a left ileofemoral endarterectomy performed on 11/16/2021. IMPRESSION: 1. No evidence of deep venous thrombosis in either lower extremity. 2. Focal fluid collection in the subcutaneous soft tissues of the left groin measuring up to 3.8 cm. This likely relates to some residual postoperative fluid from a left ileofemoral endarterectomy performed on 11/16/2021. Electronically Signed   By: Aletta Edouard M.D.   On: 02/11/2022 12:08  81 y.o. female patient multiple medical problems including CHF CKD currently admitted to hospital for worsening shortness of breath/worsening fatigue.  Patient noted to have severe anemia.  # Severe anemia-worsening over the last 3 to 4 months.  S/p blood transfusion.  Hemoglobin is 7.8.  Currently status post extensive GI workup including EGD colonoscopy/capsule study.  High clinical suspicion for primary bone marrow process like high-grade MDS Vs. 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   # CKD- III  # History of  temporal arteritis-on low-dose prednisone.  # Morbid obesity  # Recommendations:  # Rule out hemolysis-order LDH reticulocyte count peripheral smear; haptoglobin.  Also recommend checking myeloma panel kappa lambda light chain ratio.   # Given the concern for MDS/primary bone marrow process I would recommend bone marrow biopsy for further evaluation. Discussed with the patient the bone marrow biopsy and aspiration indication and procedure at length.  Given significant discomfort involved-I would recommend under sedation with radiology.  I discussed the potential complications include-bleeding/trauma and risk of infection; which are fortunately very rare.  Patient concerned and reluctant with the procedure.  I have called the patient's daughter, unable to reach her.  I left a message for her to discuss the above.  If patient is interested-will order bone marrow biopsy for tomorrow.  Thank you Dr. Sheppard Coil for allowing me to participate in the care of your pleasant patient. Please do not hesitate to contact me with questions or concerns in the interim.  Discussed with Dr. Sheppard Coil  # Addendum: Spoke to patient's daughter later in the afternoon-they finally agreed to proceed with bone marrow biopsy.  Orders placed.  N.p.o. overnight.

## 2022-02-19 NOTE — Plan of Care (Signed)
  Problem: Education: Goal: Knowledge of General Education information will improve Description: Including pain rating scale, medication(s)/side effects and non-pharmacologic comfort measures 02/19/2022 0743 by Mancel Bale, RN Outcome: Progressing 02/18/2022 1855 by Mancel Bale, RN Outcome: Progressing   Problem: Health Behavior/Discharge Planning: Goal: Ability to manage health-related needs will improve 02/19/2022 0743 by Mancel Bale, RN Outcome: Progressing 02/18/2022 1855 by Mancel Bale, RN Outcome: Progressing   Problem: Clinical Measurements: Goal: Ability to maintain clinical measurements within normal limits will improve 02/19/2022 0743 by Mancel Bale, RN Outcome: Progressing 02/18/2022 1855 by Mancel Bale, RN Outcome: Progressing Goal: Will remain free from infection 02/19/2022 0743 by Mancel Bale, RN Outcome: Progressing 02/18/2022 1855 by Mancel Bale, RN Outcome: Progressing Goal: Diagnostic test results will improve 02/19/2022 0743 by Mancel Bale, RN Outcome: Progressing 02/18/2022 1855 by Mancel Bale, RN Outcome: Progressing Goal: Respiratory complications will improve 02/19/2022 0743 by Mancel Bale, RN Outcome: Progressing 02/18/2022 1855 by Mancel Bale, RN Outcome: Progressing Goal: Cardiovascular complication will be avoided 02/19/2022 0743 by Mancel Bale, RN Outcome: Progressing 02/18/2022 1855 by Mancel Bale, RN Outcome: Progressing   Problem: Activity: Goal: Risk for activity intolerance will decrease 02/19/2022 0743 by Mancel Bale, RN Outcome: Progressing 02/18/2022 1855 by Mancel Bale, RN Outcome: Progressing   Problem: Nutrition: Goal: Adequate nutrition will be maintained 02/19/2022 0743 by Mancel Bale, RN Outcome: Progressing 02/18/2022 1855 by Mancel Bale, RN Outcome: Progressing   Problem: Coping: Goal: Level of anxiety will decrease 02/19/2022  0743 by Mancel Bale, RN Outcome: Progressing 02/18/2022 1855 by Mancel Bale, RN Outcome: Progressing   Problem: Elimination: Goal: Will not experience complications related to bowel motility 02/19/2022 0743 by Mancel Bale, RN Outcome: Progressing 02/18/2022 1855 by Mancel Bale, RN Outcome: Progressing Goal: Will not experience complications related to urinary retention 02/19/2022 0743 by Mancel Bale, RN Outcome: Progressing 02/18/2022 1855 by Mancel Bale, RN Outcome: Progressing   Problem: Pain Managment: Goal: General experience of comfort will improve 02/19/2022 0743 by Mancel Bale, RN Outcome: Progressing 02/18/2022 1855 by Mancel Bale, RN Outcome: Progressing   Problem: Safety: Goal: Ability to remain free from injury will improve 02/19/2022 0743 by Mancel Bale, RN Outcome: Progressing 02/18/2022 1855 by Mancel Bale, RN Outcome: Progressing   Problem: Skin Integrity: Goal: Risk for impaired skin integrity will decrease 02/19/2022 0743 by Mancel Bale, RN Outcome: Progressing 02/18/2022 1855 by Mancel Bale, RN Outcome: Progressing

## 2022-02-19 NOTE — Progress Notes (Signed)
Brief note -  Pt S&E on rounds - no complaints.  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 She's like to talk to her daughter.   If decides to stay, will arrange BMB and perhaps can d/c home after procedure If decides against BMB, will d/c home today

## 2022-02-19 NOTE — Consult Note (Addendum)
  Heart Failure Navigator Nurse Note  HFpEF 60-65%.  Grade 2 diastolic dysfunction.  She presented to the emergency room from home with complaints of shortness of breath, mild cough and 2+ lower extremity edema.  Chest x-ray revealed vascular congestion.  BNP 287.  Patient is obese with a BMI of 41.12.  Comorbidities:  Hyperlipidemia Hypertension GERD Depression Chronic kidney disease stage III Atrial fibrillation DVT not on anticoagulation Morbid obesity   Medications:  Furosemide 60 mg IV every 12 hours Crestor 5 mg daily  At home she is on metoprolol and torsemide.  Labs:  Sodium 141, potassium 4.2, chloride 106, CO2 27, BUN 33, creatinine 1.19, magnesium 2, hemoglobin 8, hematocrit 26.  Hemoglobin on admission was 6.5 with hematocrit of 21.6. It is 105.3 kg Intake 394 mL Output 2500 mL  Initial meeting with patient, her husband and daughter were at the bedside.  She states that she has heard of heart failure past but was not sure what it meant.  Went over the function of her heart and explained diastolic dysfunction.  Discussed  her diet in relationship to a low-sodium diet.  She states that she does use vegetables in a can, will cook in plain water, discussed rinsing the vegetables first before cooking in plain water.  She states she has not salt at the table for a long time.  In the past she has  used processed meats like hot dogs but recently cut those out of her diet.  Discussed daily weights and reporting 2 pound weight gain overnight or 5 pounds in a week along with changes in symptoms like increasing edema, increasing shortness of breath, PND and orthopnea etc.  She weighs herself daily at home.  Again reiterated reporting 2 pound weight gain overnight or 5 pounds within the week and recording.  She voices understanding.  Also discussed fluid restriction, she states that she drinks a lot of water throughout the day and does suck on ice.  Made aware that 1 cup of ice  equals 1/2 cup of liquid and this does count into her fluid restriction which included milk, juice, water, sodas, tea and coffee.  She voices understanding.  She states that at home with her diuretic she had noticed a decrease in the amount she was urinating, she states her stomach had also gotten bigger.  States at home that she has a lot of trouble with the swelling of her legs, she states that even just standing at the stove to fix the medial her leg swelling can get worse.  Discussed using compression stockings but she states that she does not like those,causes too much pain.  Adjusted when she follows up in the outpatient heart failure clinic ask Otila Kluver about compression pumps.  She has follow-up in the outpatient heart failure clinic on January 2 at 130.  She has a 0% no-show.  Pricilla Riffle RN [facility]

## 2022-02-19 NOTE — Progress Notes (Signed)
PROGRESS NOTE    Rebecca Gilmore   UEA:[id number] DOB: 1940-12-15  DOA: 02/18/2022 Date of Service: 02/19/22 PCP: Leonides Sake, MD     Brief Narrative / Hospital Course:  Rebecca Gilmore is a 81 y.o. female with medical history significant of HTN, HLD,, GERD, depression, dCHF, CKD-3B, atrial fibrillation and DVT not on anticoagulants, PVD, iron deficiency anemia, temporal arteritis on lowe dose of prednisone, recent admissions due to GIB with extensive workup without clear source identified, who presents with SOB 02/18/22 worsening shortness of breath in the past several days. Patient has mild dry cough, no chest pain, fever or chills.  Patient denies nausea, vomiting, diarrhea or abdominal pain. Patient took her last dose of Plavix yesterday.  She is on iron supplement with dark stool which is not as dark as before per patient. Pt also has worsening bilateral leg edema.  Patient was seen by cardiologist in office 12/13, and found to to be very pale, sent to ED for further evaluation and treatment. Of note - Patient has recently been admitted several times due to anemia, with most recent admission from 11/21 - 11/23 due to possible GIB and worsening anemia - 12/12/21 EGD (+)gastritis, 12/13/21 Colonoscopy no concerns. Pt had blood transfusion, with hemoglobin increased from 6.2 to 8.6. 01/28/22 capsule endoscopy which was suboptimal study and negative for acute findings.  pt states that she has .   12/13: In ED and early admission Hgb 6.5 (7.9 on 01/29/22), WBC 5.5, BNP 287, stable renal function, potassium 3.3, temperature normal, blood pressure 139/43, heart rate 64, RR 19, oxygen saturation 95% on room air.  Chest x-ray showed cardiomegaly and vascular congestion. GI saw pt, d/w her potential for repeat bidirectional endoscopies, pt decline these (reasonable given recent negative EGD/CSY/VCE). No plans for GI interventions at this time, recommend hematology consult  12/14: Hgb 8.0. Seen by  [facility] Dr Rogue Bussing. Plan for bone marrow biopsy tomorrow   Consultants:  Gastroenterology Hematology  Procedures: Planned bone marrow biopsy tomorrow 02/20/22      ASSESSMENT & PLAN:   Principal Problem:   Symptomatic anemia Active Problems:   Iron deficiency anemia   Acute on chronic diastolic (congestive) heart failure (HCC)   Atrial fibrillation, chronic (HCC)   Chronic kidney disease, stage 3b (HCC)   Hypokalemia   HLD (hyperlipidemia)   Depression   Temporal arteritis (HCC)   Morbid obesity with BMI of 40.0-44.9, adult (HCC)  Symptomatic anemia and iron deficiency anemia:  Hgb 7.9 --> 6.5.   Patient had multiple admission due to worsening anemia 2/2 possible occult GI bleeding.  Patient had extensive workup without clear source of bleeding identified.  Admitting hospitalist consulted Dr. Alice Reichert of GI, who recommended hematologist consult.   Admitting hospitalist consulted Dr. Rogue Bussing of hematology. will admit to tele bed as inpt transfuse 1 units of blood now Start IV pantoprazole 40 mg bid Zofran IV for nausea Avoid NSAIDs and SQ heparin Maintain IV access (2 large bore IVs if possible). Monitor closely and follow q6h cbc, transfuse as necessary, if Hgb<7.0 continue iron supplement LaB: INR, PTT and type screen Hold Plavix   Acute on chronic diastolic CHF (congestive heart failure) ([place]):  2D echo on 11/17/2021 showed EF of 47-82% with diastolic dysfunction grade 2.   Patient has 2+ leg edema.  BNP 287, vascular congestion on chest x-ray, clinically consistent with CHF exacerbation. 80 mg of Lasix IV was given in ED Continue Lasix 60 mg bid   Atrial fibrillation, chronic (  [place]):  Heart rate 64.   Patient is not taking anticoagulants/antiplatelets except for Plavix.  Pt is not taking Metoprolol currently No events on tele, ok to let expire     Chronic kidney disease, stage 3b [facility]):  Recent baseline creatinine 1.4-1.7.   Her creatinine is 1.69 and BUN  29, GFDR 30. Close to baseline.  Monitor renal functio by BMP   Hypokalemia: K 3.3 Repleted potassium Check magnesium level    HLD (hyperlipidemia) Crestor   Depression: continue home meds   Temporal arteritis Continue prednisone 5 mg daily   Morbid obesity with BMI of 40.0-44.9, adult ([place]): BMI 41.98, body weight 107.5 kg Healthy diet and exercise Encourage losing weight     DVT prophylaxis: SCD Pertinent IV fluids/nutrition: no continuous IV fluids  Central lines / invasive devices: none   Code Status: FULL CODE  Disposition: inpatient TOC needs: none at this time  Barriers to discharge / significant pending items: workup anemia as above, needing transfusion and close monitoring, anticipate d/c home tomorrow after BMB             Subjective:  Patient reports no complaints  Denies CP/SOB.  Pain controlled.  Denies new weakness.  Tolerating diet.  Reports no concerns w/ urination/defecation.   Family Communication: none at this time- Dr Rogue Bussing has spoken w/ patient's daughter today, will call with any additional updates/concerns.     Objective Findings:  Vitals:   02/19/22 0202 02/19/22 0428 02/19/22 0741 02/19/22 1143  BP:  (!) 89/52 (!) 137/47 (!) 112/45  Pulse:  69 (!) 59 (!) 56  Resp:  _0 Temp:  97.6 F (36.4 C) (!) 97.4 F (36.3 C) 99.1 F (37.3 C)  TempSrc:   Oral   SpO2:  96% 97% 94%  Weight: 105.3 kg     Height:        Intake/Output Summary (Last 24 hours) at 02/19/2022 1501 Last data filed at 02/18/2022 2038 Gross per 24 hour  Intake 394 ml  Output 2500 ml  Net -2106 ml   Filed Weights   02/18/22 1142 02/19/22 0202  Weight: 107.5 kg 105.3 kg    Examination:  Constitutional:  VS as above General Appearance: alert, well-developed, well-nourished, NAD Respiratory: Normal respiratory effort No wheeze No rhonchi No rales Cardiovascular: S1/S2 normal No rub/gallop auscultated No lower extremity  edema Musculoskeletal:  No clubbing/cyanosis of digits Symmetrical movement in all extremities Neurological: No cranial nerve deficit on limited exam Alert Psychiatric: Normal judgment/insight Normal mood and affect       Scheduled Medications:   cholecalciferol  1,000 Units Oral Daily   cyanocobalamin  1,000 mcg Oral Daily   ferrous sulfate  325 mg Oral Q breakfast   furosemide  60 mg Intravenous Q12H   pantoprazole (PROTONIX) IV  40 mg Intravenous Q12H   predniSONE  5 mg Oral Daily   pregabalin  75 mg Oral Daily   rosuvastatin  5 mg Oral Daily   sertraline  25 mg Oral Daily    Continuous Infusions:   PRN Medications:  acetaminophen, albuterol, amitriptyline, diphenhydrAMINE, hydrALAZINE, loratadine  Antimicrobials:  Anti-infectives (From admission, onward)    None           Data Reviewed: I have personally reviewed following labs and imaging studies  CBC: Recent Labs  Lab 02/18/22 1203 02/18/22 1910 02/18/22 2309 02/19/22 0135 02/19/22 0751  WBC 5.5 7.0 6.5 6.2 6.4  6.2  NEUTROABS 3.7  --   --   --  4.2  HGB 6.5* 8.6* 7.9* 8.0* 7.9*  7.8*  HCT 21.6* 27.7* 25.1* 26.3* 25.5*  24.7*  MCV 98.6 95.8 95.4 96.0 97.0  96.1  PLT 210 218 213 215 214  338   Basic Metabolic Panel: Recent Labs  Lab 02/18/22 1203 02/19/22 0135  NA 139 141  K 3.3* 4.2  CL 104 106  CO2 26 27  GLUCOSE 109* 135*  BUN 29* 33*  CREATININE 1.69* 1.90*  CALCIUM 8.8* 8.8*  MG 2.1 2.0   GFR: Estimated Creatinine Clearance: 27 mL/min (A) (by C-G formula based on SCr of 1.9 mg/dL (H)). Liver Function Tests: Recent Labs  Lab 02/18/22 1203  AST 19  ALT 8  ALKPHOS 49  BILITOT 0.9  PROT 6.4*  ALBUMIN 3.1*   No results for input(s): "LIPASE", "AMYLASE" in the last 168 hours. No results for input(s): "AMMONIA" in the last 168 hours. Coagulation Profile: Recent Labs  Lab 02/18/22 1203  INR 1.2   Cardiac Enzymes: No results for input(s): "CKTOTAL", "CKMB",  "CKMBINDEX", "TROPONINI" in the last 168 hours. BNP (last 3 results) No results for input(s): "PROBNP" in the last 8760 hours. HbA1C: No results for input(s): "HGBA1C" in the last 72 hours. CBG: Recent Labs  Lab 02/19/22 0742  GLUCAP 111*   Lipid Profile: No results for input(s): "CHOL", "HDL", "LDLCALC", "TRIG", "CHOLHDL", "LDLDIRECT" in the last 72 hours. Thyroid Function Tests: No results for input(s): "TSH", "T4TOTAL", "FREET4", "T3FREE", "THYROIDAB" in the last 72 hours. Anemia Panel: Recent Labs    02/19/22 0751  RETICCTPCT 4.6*   Most Recent Urinalysis On File:     Component Value Date/Time   COLORURINE YELLOW (A) 11/19/2021 1223   APPEARANCEUR CLOUDY (A) 11/19/2021 1223   LABSPEC 1.015 11/19/2021 1223   PHURINE 5.0 11/19/2021 1223   GLUCOSEU NEGATIVE 11/19/2021 1223   HGBUR SMALL (A) 11/19/2021 1223   BILIRUBINUR NEGATIVE 11/19/2021 1223   KETONESUR NEGATIVE 11/19/2021 1223   PROTEINUR 30 (A) 11/19/2021 1223   NITRITE NEGATIVE 11/19/2021 1223   LEUKOCYTESUR LARGE (A) 11/19/2021 1223   Sepsis Labs: _0 (procalcitonin:4,lacticidven:4)  No results found for this or any previous visit (from the past 240 hour(s)).       Radiology Studies: DG Chest 2 View  Result Date: 02/18/2022 CLINICAL DATA:  Choose breath EXAM: CHEST - 2 VIEW COMPARISON:  12/11/2021 FINDINGS: Stable elevation of the right diaphragm. Bilateral mild interstitial thickening. No focal consolidation. No pleural effusion or pneumothorax. Stable cardiomegaly. Thoracic aortic atherosclerosis. No acute osseous abnormality. IMPRESSION: 1. Cardiomegaly with mild pulmonary vascular congestion. Electronically Signed   By: Kathreen Devoid M.D.   On: 02/18/2022 12:46            LOS: 1 day     Emeterio Reeve, DO [facility] 02/19/2022, 3:01 PM    Dictation software may have been used to generate the above note. Typos may occur and escape review in typed/dictated notes. Please  contact Dr Sheppard Coil directly for clarity if needed.  Staff may message me via secure chat in [place]  but this may not receive an immediate response,  please page me for urgent matters!  If 7PM-7AM, please contact night coverage [email or web address]

## 2022-02-19 NOTE — Discharge Instructions (Signed)

## 2022-02-19 NOTE — Hospital Course (Addendum)
Rebecca Gilmore is a 81 y.o. female with medical history significant of HTN, HLD,, GERD, depression, dCHF, CKD-3B, atrial fibrillation and DVT not on anticoagulants, PVD, iron deficiency anemia, temporal arteritis on lowe dose of prednisone, recent admissions due to GIB with extensive workup without clear source identified, who presents with SOB 02/18/22 worsening shortness of breath in the past several days. Patient has mild dry cough, no chest pain, fever or chills.  Patient denies nausea, vomiting, diarrhea or abdominal pain. Patient took her last dose of Plavix yesterday.  She is on iron supplement with dark stool which is not as dark as before per patient. Pt also has worsening bilateral leg edema.  Patient was seen by cardiologist in office 12/13, and found to to be very pale, sent to ED for further evaluation and treatment. Of note - Patient has recently been admitted several times due to anemia, with most recent admission from 11/21 - 11/23 due to possible GIB and worsening anemia - 12/12/21 EGD (+)gastritis, 12/13/21 Colonoscopy no concerns. Pt had blood transfusion, with hemoglobin increased from 6.2 to 8.6. 01/28/22 capsule endoscopy which was suboptimal study and negative for acute findings.  pt states that she has .   12/13: In ED and early admission Hgb 6.5 (7.9 on 01/29/22), WBC 5.5, BNP 287, stable renal function, potassium 3.3, temperature normal, blood pressure 139/43, heart rate 64, RR 19, oxygen saturation 95% on room air.  Chest x-ray showed cardiomegaly and vascular congestion. GI saw pt, d/w her potential for repeat bidirectional endoscopies, pt decline these (reasonable given recent negative EGD/CSY/VCE). No plans for GI interventions at this time, recommend hematology consult  12/14: Hgb 8.0. Seen by [facility] Dr Rogue Bussing. Plan for bone marrow biopsy tomorrow  12/15: BMB and 1 additional unit PRBC administered, follow outpatient w/ hematology   Consultants:   Gastroenterology Hematology  Procedures: bone marrow biopsy 02/20/22      ASSESSMENT & PLAN:   Principal Problem:   Symptomatic anemia Active Problems:   Iron deficiency anemia   Acute on chronic diastolic (congestive) heart failure (HCC)   Atrial fibrillation, chronic (HCC)   Chronic kidney disease, stage 3b (HCC)   Hypokalemia   HLD (hyperlipidemia)   Depression   Temporal arteritis (HCC)   Morbid obesity with BMI of 40.0-44.9, adult (HCC)  Symptomatic anemia and iron deficiency anemia:  Hgb 7.9 --> 6.5.   Patient had multiple admission due to worsening anemia 2/2 possible occult GI bleeding.  Patient had extensive workup without clear source of bleeding identified.  Dr. Alice Reichert of GI recommended hematologist consult.   Dr. Rogue Bussing of hematology consulted - recs bone marrow biopsy transfuse 1 units of blood on admission, hematology recommended another unit prior to discharge  Start IV pantoprazole 40 mg bid --> po on discharge Avoid NSAIDs and d/c antiplatelets  Hold Plavix   Acute on chronic diastolic CHF (congestive heart failure) ([place]):  2D echo on 11/17/2021 showed EF of 72-09% with diastolic dysfunction grade 2.   Patient has 2+ leg edema.  BNP 287, vascular congestion on chest x-ray, clinically consistent with CHF exacerbation. 80 mg of Lasix IV was given in ED Continue Lasix 60 mg bid   Atrial fibrillation, chronic ([place]):  Heart rate 64.   Patient is not taking anticoagulants/antiplatelets except for Plavix.  Pt is not taking Metoprolol currently    Chronic kidney disease, stage 3b [facility]):  Recent baseline creatinine 1.4-1.7.   Her creatinine is 1.69 and BUN 29, GFDR 30. Close to baseline.  Monitor renal functio by BMP   Hypokalemia: K 3.3 Repleted potassium   HLD (hyperlipidemia) Crestor   Depression: continue home meds   Temporal arteritis Continue prednisone 5 mg daily   Morbid obesity with BMI of 40.0-44.9, adult ([place]): BMI 41.98, body  weight 107.5 kg Healthy diet and exercise Encourage losing weight

## 2022-02-19 NOTE — TOC Initial Note (Signed)
Transition of Care [facility]) - Initial/Assessment Note    Patient Details  Name: Rebecca Gilmore MRN: [id number] Date of Birth: January 22, 1941  [facility] of [facility]) CM/SW Contact:    Quin Hoop, LCSW Phone Number: 02/19/2022, 4:22 PM  Clinical Narrative:                 TOC readmit assessment completed.  Pt's PCP is Dr. Lisbeth Ply.  [facility] and there are no financial concerns with getting meds.  Pt states that, if SNF is recommended, she will decline and ask for [facility]. Previous [place] services with [employer].  TOC will continue to follow.  Expected Discharge Plan: Home/Self Care Barriers to Discharge: Continued Medical Work up   Patient Goals and CMS Choice Patient states their goals for this hospitalization and ongoing recovery are:: to return home      Expected Discharge Plan and Services Expected Discharge Plan: Home/Self Care       Living arrangements for the past 2 months: Single Family Home                                      Prior Living Arrangements/Services Living arrangements for the past 2 months: Single Family Home Lives with:: Self                   Activities of Daily Living Home Assistive Devices/Equipment: Environmental consultant (specify type) ADL Screening (condition at time of admission) Patient's cognitive ability adequate to safely complete daily activities?: Yes Is the patient deaf or have difficulty hearing?: No Does the patient have difficulty seeing, even when wearing glasses/contacts?: No Does the patient have difficulty concentrating, remembering, or making decisions?: No Patient able to express need for assistance with ADLs?: Yes Does the patient have difficulty dressing or bathing?: No Independently performs ADLs?: Yes (appropriate for developmental age) Does the patient have difficulty walking or climbing stairs?: No Weakness of Legs: Both Weakness of Arms/Hands: None  Permission Sought/Granted                   Emotional Assessment Appearance:: Appears stated age Attitude/Demeanor/Rapport: Engaged Affect (typically observed): Unable to Assess Orientation: :  (phone assessment completed) Alcohol / Substance Use: Not Applicable Psych Involvement: No (comment)  Admission diagnosis:  Symptomatic anemia [D64.9] Acute on chronic diastolic (congestive) heart failure (HCC) [I50.33] Patient Active Problem List   Diagnosis Date Noted   Symptomatic anemia 02/18/2022   Morbid obesity with BMI of 40.0-44.9, adult ([place]) 02/18/2022   Acute on chronic diastolic (congestive) heart failure ([place]) 02/18/2022   Chronic kidney disease, stage 3b ([place]) 01/27/2022   Hypokalemia 01/27/2022   Acute on chronic blood loss anemia 01/27/2022   Chronic kidney disease 12/23/2021   Anemia    GI bleeding 12/11/2021   Temporal arteritis ([place]) 12/11/2021   Acute blood loss anemia 12/11/2021   Depression    HLD (hyperlipidemia)    Iron deficiency anemia    Chronic diastolic CHF (congestive heart failure) (HCC)    Acute renal failure superimposed on stage 3b chronic kidney disease (HCC)    Myocardial injury    Atrial fibrillation, chronic (HCC)    Femoral artery occlusion, 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   Atrial fibrillation and flutter ([place]) 04/18/2021   Acute heart failure ([place]) 03/05/2021   Acute respiratory failure with hypoxia ([place]) 03/05/2021   Influenza A 03/05/2021   NSTEMI (non-ST elevated myocardial  infarction) ([place]) 03/05/2021   Bradycardia 06/27/2019   Peripheral edema 01/18/2018   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 [place] [address]Phone: [phone]: [phone]    Social Determinants of Health (SDOH) Interventions    Readmission Risk Interventions     No data to display

## 2022-02-20 ENCOUNTER — Other Ambulatory Visit: Payer: Self-pay | Admitting: Internal Medicine

## 2022-02-20 ENCOUNTER — Inpatient Hospital Stay: Payer: Medicare Other

## 2022-02-20 ENCOUNTER — Telehealth: Payer: Self-pay | Admitting: Internal Medicine

## 2022-02-20 DIAGNOSIS — D631 Anemia in chronic kidney disease: Secondary | ICD-10-CM | POA: Insufficient documentation

## 2022-02-20 DIAGNOSIS — D649 Anemia, unspecified: Secondary | ICD-10-CM | POA: Diagnosis not present

## 2022-02-20 DIAGNOSIS — N184 Chronic kidney disease, stage 4 (severe): Secondary | ICD-10-CM | POA: Insufficient documentation

## 2022-02-20 LAB — KAPPA/LAMBDA LIGHT CHAINS
Kappa free light chain: 110.9 mg/L — ABNORMAL HIGH (ref 3.3–19.4)
Kappa, lambda light chain ratio: 2.21 — ABNORMAL HIGH (ref 0.26–1.65)
Lambda free light chains: 50.1 mg/L — ABNORMAL HIGH (ref 5.7–26.3)

## 2022-02-20 LAB — HEMOGLOBIN AND HEMATOCRIT, BLOOD
HCT: 25.3 % — ABNORMAL LOW (ref 36.0–46.0)
HCT: 30.5 % — ABNORMAL LOW (ref 36.0–46.0)
Hemoglobin: 7.7 g/dL — ABNORMAL LOW (ref 12.0–15.0)
Hemoglobin: 9.6 g/dL — ABNORMAL LOW (ref 12.0–15.0)

## 2022-02-20 LAB — PREPARE RBC (CROSSMATCH)

## 2022-02-20 MED ORDER — SODIUM CHLORIDE 0.9% IV SOLUTION: Freq: Once | INTRAVENOUS | Status: AC

## 2022-02-20 MED ORDER — HEPARIN SOD (PORK) LOCK FLUSH 100 UNIT/ML IV SOLN: 500.0000 [IU] | Freq: Once | INTRAVENOUS | Status: DC

## 2022-02-20 MED ORDER — TORSEMIDE 100 MG PO TABS: 50.0000 mg | ORAL_TABLET | Freq: Every day | ORAL | Status: DC

## 2022-02-20 MED ORDER — HEPARIN SOD (PORK) LOCK FLUSH 100 UNIT/ML IV SOLN
INTRAVENOUS | Status: AC
  Filled 2022-02-20: qty 5

## 2022-02-20 MED ORDER — LIDOCAINE HCL (PF) 1 % IJ SOLN
10.0000 mL | Freq: Once | INTRAMUSCULAR | Status: DC
  Filled 2022-02-20: qty 10

## 2022-02-20 NOTE — Care Management Important Message (Signed)
Important Message  Patient Details  Name: Rebecca Gilmore MRN: [id number] Date of Birth: [birth year]   Medicare Important Message Given:  N/A - LOS <3 / Initial given by admissions     Dannette Barbara 02/20/2022, 8:57 AM

## 2022-02-20 NOTE — Progress Notes (Signed)
Rebecca Gilmore   DOB:[birth year]   DJ#:[id number]    Subjective: Patient denies any worsening shortness of breath or cough.  Anxious for the upcoming bone marrow biopsy this morning.  Objective:  Vitals:   02/20/22 1820 02/20/22 2016  BP: (!) 120/52 (!) 123/47  Pulse: 66 71  Resp: 16 18  Temp:  (!) 97.5 F (36.4 C)  SpO2: 93% 97%     Intake/Output Summary (Last 24 hours) at 02/20/2022 2120 Last data filed at 02/20/2022 1921 Gross per 24 hour  Intake 414.17 ml  Output 2400 ml  Net -1985.83 ml    Physical Exam Vitals and nursing note reviewed.  HENT:     Head: Normocephalic and atraumatic.     Mouth/Throat:     Pharynx: Oropharynx is clear.  Eyes:     Extraocular Movements: Extraocular movements intact.     Pupils: Pupils are equal, round, and reactive to light.  Cardiovascular:     Rate and Rhythm: Normal rate and regular rhythm.  Pulmonary:     Comments: Decreased breath sounds bilaterally.  Abdominal:     Palpations: Abdomen is soft.  Musculoskeletal:        General: Normal range of motion.     Cervical back: Normal range of motion.  Skin:    General: Skin is warm.  Neurological:     General: No focal deficit present.     Mental Status: She is alert and oriented to person, place, and time.  Psychiatric:        Behavior: Behavior normal.        Judgment: Judgment normal.      Labs:  Lab Results  Component Value Date   WBC 6.7 02/19/2022   HGB 9.6 (L) 02/20/2022   HCT 30.5 (L) 02/20/2022   MCV 97.8 02/19/2022   PLT 209 02/19/2022   NEUTROABS 4.2 02/19/2022    Lab Results  Component Value Date   NA 141 02/19/2022   K 4.2 02/19/2022   CL 106 02/19/2022   CO2 27 02/19/2022    Studies:  CT BONE MARROW BIOPSY  Result Date: 02/20/2022 INDICATION: Anemia EXAM: CT BIOPSY BONE MARROW MEDICATIONS: None. ANESTHESIA/SEDATION: Local analgesia FLUOROSCOPY TIME:  N/a COMPLICATIONS: None immediate. PROCEDURE: Informed written consent was obtained from the  patient after a thorough discussion of the procedural risks, benefits and alternatives. All questions were addressed. Maximal Sterile Barrier Technique was utilized including caps, mask, sterile gowns, sterile gloves, sterile drape, hand hygiene and skin antiseptic. A timeout was performed prior to the initiation of the procedure. The patient was placed prone on the CT exam table. Limited CT of the pelvis was performed for planning purposes. Skin entry site was marked, and the overlying skin was prepped and draped in the standard sterile fashion. Local analgesia was obtained with 1% lidocaine. Using CT guidance, an 11 gauge needle was advanced just deep to the cortex of the left posterior ilium. Subsequently, bone marrow aspiration and core biopsy were performed. Specimens were submitted to lab/pathology for handling. Hemostasis was achieved with manual pressure, and a clean dressing was placed. The patient tolerated the procedure well without immediate complication. IMPRESSION: Successful CT-guided bone marrow aspiration and core biopsy of the left posterior ilium. Electronically Signed   By: Albin Felling M.D.   On: 02/20/2022 10:53    No problem-specific Assessment & Plan notes found for this encounter.  81 y.o. female patient multiple medical problems including CHF CKD currently admitted to hospital for worsening shortness of breath/worsening  fatigue.  Patient noted to have severe anemia.   # Severe anemia-worsening over the last 3 to 4 months.  S/p blood transfusion.  Hemoglobin is 7.8.  Currently status post extensive GI workup including EGD colonoscopy/capsule study.  High clinical suspicion for primary bone marrow process like high-grade MDS Vs. anemia secondary to CKD/iron defciency- Iron sat- 20%. Proceed with bone marrow biopsy for further diagnostic evaluation and therapeutic options.  Pending- myeloma panel kappa lambda light chain ratio.    # History of left lower extremity DVT [sep 2023]-not  on anticoagulation   # Congestive heart failure/A-fib-not on anticoagulation; on plavix.    # CKD- III   # History of temporal arteritis-on low-dose prednisone.   # Morbid obesity  # Disposition: Patient could potentially be discharged post bone marrow biopsy if clinically stable from medical standpoint.  Recommend follow-up the week of Christmas.  My staff should be able to contact the patient for follow-up in the cancer center.  Discussed with Dr. Sheppard Coil.  Cammie Sickle, MD 02/20/2022  9:20 PM

## 2022-02-20 NOTE — Telephone Encounter (Signed)
M-please have the patient follow-up the week of Christmas- MD; labs CBC BMP hold tube; possible Venofer.  D-2 possible blood transfusion.  Contact the patient's daughter-for appointments.  Thanks, GB

## 2022-02-20 NOTE — Consult Note (Signed)
Patient was seen in relation to request received by IR for bone marrow biopsy due to severe anemia. The case was discussed with Dr Denna Haggard who approved an image-guided bone marrow biopsy with local anesthetic. Patient was seen bedside and a discussion occurred where it was discussed that an image-guided bone marrow biopsy with local anesthetic could be performed today, but if the patient preferred to be sedated for the procedure, that the procedure could be scheduled as an outpatient. The patient expressed a desire to have the image-guided bone marrow biopsy performed today with local anesthetic only.  Risks and benefits of image-guided bone marrow biopsy was discussed with the patient and/or patient's family including, but not limited to bleeding, infection, damage to adjacent structures or low yield requiring additional tests.  All of the questions were answered and there is agreement to proceed.  Consent signed and in chart.  Lura Em, PA-C 02/20/2022 9:07 AM

## 2022-02-20 NOTE — Progress Notes (Signed)
   Heart Failure Nurse Navigator Note  Met with patient today in follow-up to heart failure teaching completed yesterday.  No family members at the bedside.  She states that her bone marrow biopsy was completed earlier this morning.   By teach back method went over sodium restriction, fluid restriction, daily weights and what to report.  She needs reinforcement.  Discussing fluid restriction she states" you mean that I cannot be drinking a lot of water."  Went over the relationship between drinking too much and being on a fluid pill, that defeats the purpose.  She needs to limit to no more than 64 ounces of any liquid, water, or tea, coffee, juices, sodas, also the ice that she sucks on daily.  Given written information about the fluid restriction.  Also explained the ventricle health program.  Patient states that she does not have a smart phone nor do they have computer to be communicating with the company.  She declines at this time.  She had no further questions.  Pricilla Riffle RN [facility]

## 2022-02-20 NOTE — Discharge Summary (Incomplete)
Physician Discharge Summary   Patient: Rebecca Gilmore MRN: [id number]  DOB: 1940/12/24   Admit:     Date of Admission: 02/18/2022 Admitted from: home   Discharge: Date of discharge: 02/20/22 Disposition: Home Condition at discharge: fair  CODE STATUS: FULL CODE      Discharge Physician: Emeterio Reeve, DO [facility]     PCP: Leonides Sake, MD  Recommendations for Outpatient Follow-up:  Follow up with PCP Hamrick, Lorin Mercy, MD in 1-2 weeks Follow up with hematology Dr Rogue Bussing in 1 week  Please obtain labs/tests: CBC in 1 week Please follow up on the following pending results: bone marrow biopsy  PCP AND OTHER OUTPATIENT PROVIDERS: SEE [facility] PATIENT INFO    Discharge Instructions     Diet - low sodium heart healthy   Complete by: As directed    Discharge wound care:   Complete by: As directed    Keep biopsy site clean and dry - ok to shower tomorrow and pat dry after shower, do not soak   Increase activity slowly   Complete by: As directed          Discharge Diagnoses: Principal Problem:   Symptomatic anemia Active Problems:   Iron deficiency anemia   Acute on chronic diastolic (congestive) heart failure (HCC)   Atrial fibrillation, chronic (HCC)   Chronic kidney disease, stage 3b ([place])   Hypokalemia   HLD (hyperlipidemia)   Depression   Temporal arteritis ([place])   Morbid obesity with BMI of 40.0-44.9, adult [facility])       Hospital Course: Rebecca Gilmore is a 81 y.o. female with medical history significant of HTN, HLD,, GERD, depression, dCHF, CKD-3B, atrial fibrillation and DVT not on anticoagulants, PVD, iron deficiency anemia, temporal arteritis on lowe dose of prednisone, recent admissions due to GIB with extensive workup without clear source identified, who presents with SOB 02/18/22 worsening shortness of breath in the past several days. Patient  has mild dry cough, no chest pain, fever or chills.  Patient denies nausea, vomiting, diarrhea or abdominal pain. Patient took her last dose of Plavix yesterday.  She is on iron supplement with dark stool which is not as dark as before per patient. Pt also has worsening bilateral leg edema.  Patient was seen by cardiologist in office 12/13, and found to to be very pale, sent to ED for further evaluation and treatment. Of note - Patient has recently been admitted several times due to anemia, with most recent admission from 11/21 - 11/23 due to possible GIB and worsening anemia - 12/12/21 EGD (+)gastritis, 12/13/21 Colonoscopy no concerns. Pt had blood transfusion, with hemoglobin increased from 6.2 to 8.6. 01/28/22 capsule endoscopy which was suboptimal study and negative for acute findings.  pt states that she has .   12/13: In ED and early admission Hgb 6.5 (7.9 on 01/29/22), WBC 5.5, BNP 287, stable renal function, potassium 3.3, temperature normal, blood pressure 139/43, heart rate 64, RR 19, oxygen saturation 95% on room air.  Chest x-ray showed cardiomegaly and vascular congestion. GI saw pt, d/w her potential for repeat bidirectional endoscopies, pt decline these (reasonable given recent negative EGD/CSY/VCE). No plans for GI interventions at this time, recommend hematology consult  12/14: Hgb 8.0. Seen by [facility] Dr Rogue Bussing. Plan for bone marrow biopsy tomorrow  12/15: bone marrow biopsy uneventful. Dr Rogue Bussing recommended another unit of PRBC prior to discharge, repeat Hgb ***  and discharge order placed   Consultants:  Gastroenterology Hematology  Procedures: Planned bone marrow biopsy tomorrow 02/20/22      ASSESSMENT & PLAN:   Principal Problem:   Symptomatic anemia Active Problems:   Iron deficiency anemia   Acute on chronic diastolic (congestive) heart failure (HCC)   Atrial fibrillation, chronic (HCC)   Chronic kidney disease, stage 3b (HCC)   Hypokalemia   HLD  (hyperlipidemia)   Depression   Temporal arteritis (HCC)   Morbid obesity with BMI of 40.0-44.9, adult (HCC)  Symptomatic anemia and iron deficiency anemia:  Hgb 7.9 --> 6.5 on admission   Patient had multiple admission due to worsening anemia 2/2 possible occult GI bleeding.  Patient had extensive workup without clear source of bleeding identified.  Transfused 2 units of blood total continue iron supplement Hold Plavix   Acute on chronic diastolic CHF (congestive heart failure) ([place]):  2D echo on 11/17/2021 showed EF of 31-51% with diastolic dysfunction grade 2.   Patient has 2+ leg edema.  BNP 287, vascular congestion on chest x-ray, clinically consistent with CHF exacerbation. 80 mg of Lasix IV was given in ED Continue Lasix 60 mg bid   Atrial fibrillation, chronic ([place]):  Heart rate 64.   Patient is not taking anticoagulants/antiplatelets except for Plavix.  Pt is not taking Metoprolol currently No events on tele, ok to D/C    Chronic kidney disease, stage 3b [facility]):  Recent baseline creatinine 1.4-1.7.   Her creatinine is 1.69 and BUN 29, GFDR 30. Close to baseline.  Monitor periodic BMP   Hypokalemia:  Repleted potassium Check magnesium level    HLD (hyperlipidemia) Crestor   Depression: continue home meds   Temporal arteritis Continue prednisone 5 mg daily   Morbid obesity with BMI of 40.0-44.9, adult ([place]): BMI 41.98, body weight 107.5 kg Healthy diet and exercise Encourage losing weight               Discharge Instructions  Allergies as of 02/20/2022       Reactions   Morphine Hives, Itching   Other Reaction(s): Confusion   Morphine And Related Itching   Kenalog [triamcinolone Acetonide] Other (See Comments)   FLUSH IN FACE   Penicillins Other (See Comments)   Has patient had a PCN reaction causing immediate rash, facial/tongue/throat swelling, SOB or lightheadedness with hypotension: Unknown Has patient had a PCN reaction causing severe  rash involving mucus membranes or skin necrosis: Unknown Has patient had a PCN reaction that required hospitalization:No Has patient had a PCN reaction occurring within the last 10 years: No If all of the above answers are "NO", then may proceed with Cephalosporin use.        Medication List     STOP taking these medications    clopidogrel 75 MG tablet Commonly known as: PLAVIX   furosemide 40 MG tablet Commonly known as: LASIX   potassium chloride SA 20 MEQ tablet Commonly known as: KLOR-CON M       TAKE these medications    acetaminophen 325 MG tablet Commonly known as: TYLENOL Take 2 tablets (650 mg total) by mouth every 6 (six) hours as needed for mild pain or fever.   amitriptyline 25 MG tablet Commonly known as: ELAVIL Take 25 mg by mouth at bedtime.   cholecalciferol 25 MCG (1000 UNIT) tablet Commonly known as: VITAMIN D3 Take 1,000 Units by mouth daily.   cyanocobalamin 1000 MCG tablet Commonly known as: VITAMIN B12 Take 1,000 mcg by mouth.  Iron 325 (65 Fe) MG Tabs Take 1 tablet by mouth daily.   loratadine 10 MG tablet Commonly known as: CLARITIN Take 10 mg by mouth daily as needed.   metoprolol succinate 25 MG 24 hr tablet Commonly known as: TOPROL-XL Take 0.5 tablets (12.5 mg total) by mouth daily. Discuss with your doctor if u need to continue this med   pantoprazole 40 MG tablet Commonly known as: PROTONIX Take 40 mg by mouth every morning.   predniSONE 1 MG tablet Commonly known as: DELTASONE Take 1 mg by mouth daily.   pregabalin 75 MG capsule Commonly known as: Lyrica Take 1 capsule (75 mg total) by mouth daily.   rosuvastatin 5 MG tablet Commonly known as: CRESTOR Take 5 mg by mouth daily.   sertraline 50 MG tablet Commonly known as: ZOLOFT Take 25 mg by mouth daily.   torsemide 100 MG tablet Commonly known as: DEMADEX Take 0.5 tablets (50 mg total) by mouth daily. Increase to 1-1.5 tablet (100-150 mg total) by mouth ONCE  daily (total daily dose 100-150 mg) as needed for up to 3 days for increased leg swelling, shortness of breath, weight gain 5+ lbs over 1-2 days. Seek medical care if these symptoms are not improving with increased dose. What changed: additional instructions               Discharge Care Instructions  (From admission, onward)           Start     Ordered   02/20/22 0000  Discharge wound care:       Comments: Keep biopsy site clean and dry - ok to shower tomorrow and pat dry after shower, do not soak   02/20/22 1708             Follow-up Information     Cammie Sickle, MD. Schedule an appointment as soon as possible for a visit.   Specialties: Internal Medicine, Oncology Contact information: [address] [place] [address] [phone]                 Allergies  Allergen Reactions   Morphine Hives and Itching    Other Reaction(s): Confusion   Morphine And Related Itching   Kenalog [Triamcinolone Acetonide] Other (See Comments)    FLUSH IN FACE   Penicillins Other (See Comments)    Has patient had a PCN reaction causing immediate rash, facial/tongue/throat swelling, SOB or lightheadedness with hypotension: Unknown Has patient had a PCN reaction causing severe rash involving mucus membranes or skin necrosis: Unknown Has patient had a PCN reaction that required hospitalization:No Has patient had a PCN reaction occurring within the last 10 years: No If all of the above answers are "NO", then may proceed with Cephalosporin use.      Subjective: pt feeling tired but okay overall    Discharge Exam: BP (!) 121/100 (BP Location: Right Arm)   Pulse (!) 59   Temp 99.4 F (37.4 C)   Resp 16   Ht _0  (1.6 m)   Wt 105 kg   SpO2 97%   BMI 41.01 kg/m  General: Pt is alert, awake, not in acute distress Cardiovascular: RRR, S1/S2 +, no rubs, no gallops Respiratory: CTA bilaterally, no wheezing, no rhonchi Abdominal: Soft, NT, ND, bowel  sounds + Extremities: no edema, no cyanosis     The results of significant diagnostics from this hospitalization (including imaging, microbiology, ancillary and laboratory) are listed below for reference.     Microbiology:  No results found for this or any previous visit (from the past 240 hour(s)).   Labs: BNP (last 3 results) Recent Labs    12/11/21 0903 01/27/22 1027 02/18/22 1203  BNP 262.4* 260.3* 631.4*   Basic Metabolic Panel: Recent Labs  Lab 02/18/22 1203 02/19/22 0135  NA 139 141  K 3.3* 4.2  CL 104 106  CO2 26 27  GLUCOSE 109* 135*  BUN 29* 33*  CREATININE 1.69* 1.90*  CALCIUM 8.8* 8.8*  MG 2.1 2.0   Liver Function Tests: Recent Labs  Lab 02/18/22 1203  AST 19  ALT 8  ALKPHOS 49  BILITOT 0.9  PROT 6.4*  ALBUMIN 3.1*   No results for input(s): "LIPASE", "AMYLASE" in the last 168 hours. No results for input(s): "AMMONIA" in the last 168 hours. CBC: Recent Labs  Lab 02/18/22 1203 02/18/22 1910 02/18/22 2309 02/19/22 0135 02/19/22 0751 02/19/22 1414 02/19/22 2157 02/20/22 0526  WBC 5.5 7.0 6.5 6.2 6.4  6.2 6.7  --   --   NEUTROABS 3.7  --   --   --  4.2  --   --   --   HGB 6.5* 8.6* 7.9* 8.0* 7.9*  7.8* 8.1* 7.8* 7.7*  HCT 21.6* 27.7* 25.1* 26.3* 25.5*  24.7* 26.7* 25.4* 25.3*  MCV 98.6 95.8 95.4 96.0 97.0  96.1 97.8  --   --   PLT 210 218 213 215 214  209 209  --   --    Cardiac Enzymes: No results for input(s): "CKTOTAL", "CKMB", "CKMBINDEX", "TROPONINI" in the last 168 hours. BNP: Invalid input(s): "POCBNP" CBG: Recent Labs  Lab 02/19/22 0742  GLUCAP 111*   D-Dimer No results for input(s): "DDIMER" in the last 72 hours. Hgb A1c No results for input(s): "HGBA1C" in the last 72 hours. Lipid Profile No results for input(s): "CHOL", "HDL", "LDLCALC", "TRIG", "CHOLHDL", "LDLDIRECT" in the last 72 hours. Thyroid function studies No results for input(s): "TSH", "T4TOTAL", "T3FREE", "THYROIDAB" in the last 72 hours.  Invalid  input(s): "FREET3" Anemia work up Recent Labs    02/19/22 0751  RETICCTPCT 4.6*   Urinalysis    Component Value Date/Time   COLORURINE YELLOW (A) 11/19/2021 1223   APPEARANCEUR CLOUDY (A) 11/19/2021 1223   LABSPEC 1.015 11/19/2021 1223   PHURINE 5.0 11/19/2021 1223   GLUCOSEU NEGATIVE 11/19/2021 1223   HGBUR SMALL (A) 11/19/2021 1223   BILIRUBINUR NEGATIVE 11/19/2021 1223   KETONESUR NEGATIVE 11/19/2021 1223   PROTEINUR 30 (A) 11/19/2021 1223   NITRITE NEGATIVE 11/19/2021 1223   LEUKOCYTESUR LARGE (A) 11/19/2021 1223   Sepsis Labs Recent Labs  Lab 02/18/22 2309 02/19/22 0135 02/19/22 0751 02/19/22 1414  WBC 6.5 6.2 6.4  6.2 6.7   Microbiology No results found for this or any previous visit (from the past 240 hour(s)). Imaging DG Chest 2 View  Result Date: 02/18/2022 CLINICAL DATA:  Choose breath EXAM: CHEST - 2 VIEW COMPARISON:  12/11/2021 FINDINGS: Stable elevation of the right diaphragm. Bilateral mild interstitial thickening. No focal consolidation. No pleural effusion or pneumothorax. Stable cardiomegaly. Thoracic aortic atherosclerosis. No acute osseous abnormality. IMPRESSION: 1. Cardiomegaly with mild pulmonary vascular congestion. Electronically Signed   By: Kathreen Devoid M.D.   On: 02/18/2022 12:46      Time coordinating discharge: over 30 minutes  SIGNED:  Emeterio Reeve DO [facility]

## 2022-02-21 LAB — BPAM RBC
Blood Product Expiration Date: 202312222359
Blood Product Expiration Date: 202401102359
ISSUE DATE / TIME: 202312131410
ISSUE DATE / TIME: 202312151500
Unit Type and Rh: 600
Unit Type and Rh: 600

## 2022-02-21 LAB — TYPE AND SCREEN
ABO/RH(D): A NEG
Antibody Screen: NEGATIVE
Unit division: 0
Unit division: 0

## 2022-02-21 LAB — ERYTHROPOIETIN: Erythropoietin: 27.2 m[IU]/mL — ABNORMAL HIGH (ref 2.6–18.5)

## 2022-02-21 LAB — HAPTOGLOBIN: Haptoglobin: 163 mg/dL (ref 41–333)

## 2022-02-22 NOTE — Discharge Summary (Signed)
Physician Discharge Summary   Patient: Rebecca Gilmore MRN: [id number]  DOB: [birth year]   Admit:     Date of Admission: 02/18/2022 Admitted from: home   Discharge: Date of discharge: 02/20/2022 Disposition: Home Condition at discharge: fair  CODE STATUS: FULL CODE     Discharge Physician: Emeterio Reeve, DO [facility]     PCP: Leonides Sake, MD  Recommendations for Outpatient Follow-up:  Follow up with PCP Hamrick, Maura L, MD in 2-4 weeks FOllow up w/ hematology in 1 week  Please obtain labs/tests: CBC, BMP in <1 week Please follow up on the following pending results: bone marrow biopsy    Discharge Instructions     Diet - low sodium heart healthy   Complete by: As directed    Discharge wound care:   Complete by: As directed    Keep biopsy site clean and dry - ok to shower tomorrow and pat dry after shower, do not soak   Increase activity slowly   Complete by: As directed          Discharge Diagnoses: Principal Problem:   Symptomatic anemia Active Problems:   Iron deficiency anemia   Acute on chronic diastolic (congestive) heart failure (HCC)   Atrial fibrillation, chronic (HCC)   Chronic kidney disease, stage 3b (HCC)   Hypokalemia   HLD (hyperlipidemia)   Depression   Temporal arteritis ([place])   Morbid obesity with BMI of 40.0-44.9, adult [facility])       Hospital Course: Rebecca Gilmore is a 81 y.o. female with medical history significant of HTN, HLD,, GERD, depression, dCHF, CKD-3B, atrial fibrillation and DVT not on anticoagulants, PVD, iron deficiency anemia, temporal arteritis on lowe dose of prednisone, recent admissions due to GIB with extensive workup without clear source identified, who presents with SOB 02/18/22 worsening shortness of breath in the past several days. Patient has mild dry cough, no chest pain, fever or chills.  Patient denies nausea, vomiting, diarrhea or abdominal pain. Patient took her last dose of Plavix  yesterday.  She is on iron supplement with dark stool which is not as dark as before per patient. Pt also has worsening bilateral leg edema.  Patient was seen by cardiologist in office 12/13, and found to to be very pale, sent to ED for further evaluation and treatment. Of note - Patient has recently been admitted several times due to anemia, with most recent admission from 11/21 - 11/23 due to possible GIB and worsening anemia - 12/12/21 EGD (+)gastritis, 12/13/21 Colonoscopy no concerns. Pt had blood transfusion, with hemoglobin increased from 6.2 to 8.6. 01/28/22 capsule endoscopy which was suboptimal study and negative for acute findings.  pt states that she has .   12/13: In ED and early admission Hgb 6.5 (7.9 on 01/29/22), WBC 5.5, BNP 287, stable renal function, potassium 3.3, temperature normal, blood pressure 139/43, heart rate 64, RR 19, oxygen saturation 95% on room air.  Chest x-ray showed cardiomegaly and vascular congestion. GI saw pt, d/w her potential for repeat bidirectional endoscopies, pt decline these (reasonable given recent negative EGD/CSY/VCE). No plans for GI interventions at this time, recommend hematology consult  12/14: Hgb 8.0. Seen by [facility] Dr Rogue Bussing. Plan for bone marrow biopsy tomorrow  12/15: BMB and 1 additional unit PRBC administered, follow outpatient w/ hematology   Consultants:  Gastroenterology Hematology  Procedures: bone marrow biopsy 02/20/22      ASSESSMENT & PLAN:   Principal Problem:   Symptomatic anemia Active Problems:  Iron deficiency anemia   Acute on chronic diastolic (congestive) heart failure (HCC)   Atrial fibrillation, chronic (HCC)   Chronic kidney disease, stage 3b (HCC)   Hypokalemia   HLD (hyperlipidemia)   Depression   Temporal arteritis (HCC)   Morbid obesity with BMI of 40.0-44.9, adult (HCC)  Symptomatic anemia and iron deficiency anemia:  Hgb 7.9 --> 6.5.   Patient had multiple admission due to worsening anemia 2/2  possible occult GI bleeding.  Patient had extensive workup without clear source of bleeding identified.  Dr. Alice Reichert of GI recommended hematologist consult.   Dr. Rogue Bussing of hematology consulted - recs bone marrow biopsy transfuse 1 units of blood on admission, hematology recommended another unit prior to discharge  Start IV pantoprazole 40 mg bid --> po on discharge Avoid NSAIDs and d/c antiplatelets  Hold Plavix   Acute on chronic diastolic CHF (congestive heart failure) ([place]):  2D echo on 11/17/2021 showed EF of 82-80% with diastolic dysfunction grade 2.   Patient has 2+ leg edema.  BNP 287, vascular congestion on chest x-ray, clinically consistent with CHF exacerbation. 80 mg of Lasix IV was given in ED Continue Lasix 60 mg bid   Atrial fibrillation, chronic ([place]):  Heart rate 64.   Patient is not taking anticoagulants/antiplatelets except for Plavix.  Pt is not taking Metoprolol currently    Chronic kidney disease, stage 3b [facility]):  Recent baseline creatinine 1.4-1.7.   Her creatinine is 1.69 and BUN 29, GFDR 30. Close to baseline.  Monitor renal functio by BMP   Hypokalemia: K 3.3 Repleted potassium   HLD (hyperlipidemia) Crestor   Depression: continue home meds   Temporal arteritis Continue prednisone 5 mg daily   Morbid obesity with BMI of 40.0-44.9, adult ([place]): BMI 41.98, body weight 107.5 kg Healthy diet and exercise Encourage losing weight           Discharge Instructions  Allergies as of 02/20/2022       Reactions   Morphine Hives, Itching   Other Reaction(s): Confusion   Morphine And Related Itching   Kenalog [triamcinolone Acetonide] Other (See Comments)   FLUSH IN FACE   Penicillins Other (See Comments)   Has patient had a PCN reaction causing immediate rash, facial/tongue/throat swelling, SOB or lightheadedness with hypotension: Unknown Has patient had a PCN reaction causing severe rash involving mucus membranes or skin necrosis:  Unknown Has patient had a PCN reaction that required hospitalization:No Has patient had a PCN reaction occurring within the last 10 years: No If all of the above answers are "NO", then may proceed with Cephalosporin use.        Medication List     STOP taking these medications    clopidogrel 75 MG tablet Commonly known as: PLAVIX   furosemide 40 MG tablet Commonly known as: LASIX   potassium chloride SA 20 MEQ tablet Commonly known as: KLOR-CON M       TAKE these medications    acetaminophen 325 MG tablet Commonly known as: TYLENOL Take 2 tablets (650 mg total) by mouth every 6 (six) hours as needed for mild pain or fever.   amitriptyline 25 MG tablet Commonly known as: ELAVIL Take 25 mg by mouth at bedtime.   cholecalciferol 25 MCG (1000 UNIT) tablet Commonly known as: VITAMIN D3 Take 1,000 Units by mouth daily.   cyanocobalamin 1000 MCG tablet Commonly known as: VITAMIN B12 Take 1,000 mcg by mouth.   Iron 325 (65 Fe) MG Tabs Take 1 tablet by mouth  daily.   loratadine 10 MG tablet Commonly known as: CLARITIN Take 10 mg by mouth daily as needed.   metoprolol succinate 25 MG 24 hr tablet Commonly known as: TOPROL-XL Take 0.5 tablets (12.5 mg total) by mouth daily. Discuss with your doctor if u need to continue this med   pantoprazole 40 MG tablet Commonly known as: PROTONIX Take 40 mg by mouth every morning.   predniSONE 1 MG tablet Commonly known as: DELTASONE Take 1 mg by mouth daily.   pregabalin 75 MG capsule Commonly known as: Lyrica Take 1 capsule (75 mg total) by mouth daily.   rosuvastatin 5 MG tablet Commonly known as: CRESTOR Take 5 mg by mouth daily.   sertraline 50 MG tablet Commonly known as: ZOLOFT Take 25 mg by mouth daily.   torsemide 100 MG tablet Commonly known as: DEMADEX Take 0.5 tablets (50 mg total) by mouth daily. Increase to 1-1.5 tablet (100-150 mg total) by mouth ONCE daily (total daily dose 100-150 mg) as needed for  up to 3 days for increased leg swelling, shortness of breath, weight gain 5+ lbs over 1-2 days. Seek medical care if these symptoms are not improving with increased dose. What changed: additional instructions               Discharge Care Instructions  (From admission, onward)           Start     Ordered   02/20/22 0000  Discharge wound care:       Comments: Keep biopsy site clean and dry - ok to shower tomorrow and pat dry after shower, do not soak   02/20/22 1708             Follow-up Information     Cammie Sickle, MD. Schedule an appointment as soon as possible for a visit.   Specialties: Internal Medicine, Oncology Contact information: [address] [place] [address] [phone]                 Allergies  Allergen Reactions   Morphine Hives and Itching    Other Reaction(s): Confusion   Morphine And Related Itching   Kenalog [Triamcinolone Acetonide] Other (See Comments)    FLUSH IN FACE   Penicillins Other (See Comments)    Has patient had a PCN reaction causing immediate rash, facial/tongue/throat swelling, SOB or lightheadedness with hypotension: Unknown Has patient had a PCN reaction causing severe rash involving mucus membranes or skin necrosis: Unknown Has patient had a PCN reaction that required hospitalization:No Has patient had a PCN reaction occurring within the last 10 years: No If all of the above answers are "NO", then may proceed with Cephalosporin use.      Subjective: pt feeling tired but okay for discharge home, no CP/SOB   Discharge Exam: BP (!) 123/47 (BP Location: Right Arm)   Pulse 71   Temp (!) 97.5 F (36.4 C) (Oral)   Resp 18   Ht _0  (1.6 m)   Wt 105 kg   SpO2 97%   BMI 41.01 kg/m  General: Pt is alert, awake, not in acute distress Cardiovascular: RRR, S1/S2 +, no rubs, no gallops Respiratory: CTA bilaterally, no wheezing, no rhonchi Abdominal: Soft, NT, ND, bowel sounds + Extremities: no  edema, no cyanosis     The results of significant diagnostics from this hospitalization (including imaging, microbiology, ancillary and laboratory) are listed below for reference.     Microbiology: No results found for this or any  previous visit (from the past 240 hour(s)).   Labs: BNP (last 3 results) Recent Labs    12/11/21 0903 01/27/22 1027 02/18/22 1203  BNP 262.4* 260.3* 266.9*   Basic Metabolic Panel: Recent Labs  Lab 02/18/22 1203 02/19/22 0135  NA 139 141  K 3.3* 4.2  CL 104 106  CO2 26 27  GLUCOSE 109* 135*  BUN 29* 33*  CREATININE 1.69* 1.90*  CALCIUM 8.8* 8.8*  MG 2.1 2.0   Liver Function Tests: Recent Labs  Lab 02/18/22 1203  AST 19  ALT 8  ALKPHOS 49  BILITOT 0.9  PROT 6.4*  ALBUMIN 3.1*   No results for input(s): "LIPASE", "AMYLASE" in the last 168 hours. No results for input(s): "AMMONIA" in the last 168 hours. CBC: Recent Labs  Lab 02/18/22 1203 02/18/22 1910 02/18/22 2309 02/19/22 0135 02/19/22 0751 02/19/22 1414 02/19/22 2157 02/20/22 0526 02/20/22 1942  WBC 5.5 7.0 6.5 6.2 6.4  6.2 6.7  --   --   --   NEUTROABS 3.7  --   --   --  4.2  --   --   --   --   HGB 6.5* 8.6* 7.9* 8.0* 7.9*  7.8* 8.1* 7.8* 7.7* 9.6*  HCT 21.6* 27.7* 25.1* 26.3* 25.5*  24.7* 26.7* 25.4* 25.3* 30.5*  MCV 98.6 95.8 95.4 96.0 97.0  96.1 97.8  --   --   --   PLT 210 218 213 215 214  209 209  --   --   --    Cardiac Enzymes: No results for input(s): "CKTOTAL", "CKMB", "CKMBINDEX", "TROPONINI" in the last 168 hours. BNP: Invalid input(s): "POCBNP" CBG: Recent Labs  Lab 02/19/22 0742  GLUCAP 111*   D-Dimer No results for input(s): "DDIMER" in the last 72 hours. Hgb A1c No results for input(s): "HGBA1C" in the last 72 hours. Lipid Profile No results for input(s): "CHOL", "HDL", "LDLCALC", "TRIG", "CHOLHDL", "LDLDIRECT" in the last 72 hours. Thyroid function studies No results for input(s): "TSH", "T4TOTAL", "T3FREE", "THYROIDAB" in the last  72 hours.  Invalid input(s): "FREET3" Anemia work up No results for input(s): "VITAMINB12", "FOLATE", "FERRITIN", "TIBC", "IRON", "RETICCTPCT" in the last 72 hours. Urinalysis    Component Value Date/Time   COLORURINE YELLOW (A) 11/19/2021 1223   APPEARANCEUR CLOUDY (A) 11/19/2021 1223   LABSPEC 1.015 11/19/2021 1223   PHURINE 5.0 11/19/2021 1223   GLUCOSEU NEGATIVE 11/19/2021 1223   HGBUR SMALL (A) 11/19/2021 1223   BILIRUBINUR NEGATIVE 11/19/2021 1223   KETONESUR NEGATIVE 11/19/2021 1223   PROTEINUR 30 (A) 11/19/2021 1223   NITRITE NEGATIVE 11/19/2021 1223   LEUKOCYTESUR LARGE (A) 11/19/2021 1223   Sepsis Labs Recent Labs  Lab 02/18/22 2309 02/19/22 0135 02/19/22 0751 02/19/22 1414  WBC 6.5 6.2 6.4  6.2 6.7   Microbiology No results found for this or any previous visit (from the past 240 hour(s)). Imaging DG Chest 2 View  Result Date: 02/18/2022 CLINICAL DATA:  Choose breath EXAM: CHEST - 2 VIEW COMPARISON:  12/11/2021 FINDINGS: Stable elevation of the right diaphragm. Bilateral mild interstitial thickening. No focal consolidation. No pleural effusion or pneumothorax. Stable cardiomegaly. Thoracic aortic atherosclerosis. No acute osseous abnormality. IMPRESSION: 1. Cardiomegaly with mild pulmonary vascular congestion. Electronically Signed   By: Kathreen Devoid M.D.   On: 02/18/2022 12:46      Time coordinating discharge: over 30 minutes  SIGNED:  Emeterio Reeve DO [facility]

## 2022-02-23 ENCOUNTER — Other Ambulatory Visit: Payer: Self-pay | Admitting: *Deleted

## 2022-02-23 DIAGNOSIS — D631 Anemia in chronic kidney disease: Secondary | ICD-10-CM

## 2022-02-24 DIAGNOSIS — Z79899 Other long term (current) drug therapy: Secondary | ICD-10-CM | POA: Diagnosis not present

## 2022-02-24 DIAGNOSIS — D649 Anemia, unspecified: Secondary | ICD-10-CM | POA: Diagnosis not present

## 2022-02-24 LAB — MULTIPLE MYELOMA PANEL, SERUM
Albumin SerPl Elph-Mcnc: 3.2 g/dL (ref 2.9–4.4)
Albumin/Glob SerPl: 1.1 (ref 0.7–1.7)
Alpha 1: 0.4 g/dL (ref 0.0–0.4)
Alpha2 Glob SerPl Elph-Mcnc: 0.8 g/dL (ref 0.4–1.0)
B-Globulin SerPl Elph-Mcnc: 1 g/dL (ref 0.7–1.3)
Gamma Glob SerPl Elph-Mcnc: 1.1 g/dL (ref 0.4–1.8)
Globulin, Total: 3.2 g/dL (ref 2.2–3.9)
IgA: 336 mg/dL (ref 64–422)
IgG (Immunoglobin G), Serum: 1010 mg/dL (ref 586–1602)
IgM (Immunoglobulin M), Srm: 134 mg/dL (ref 26–217)
Total Protein ELP: 6.4 g/dL (ref 6.0–8.5)

## 2022-02-26 ENCOUNTER — Other Ambulatory Visit (INDEPENDENT_AMBULATORY_CARE_PROVIDER_SITE_OTHER): Payer: Self-pay | Admitting: Nurse Practitioner

## 2022-02-26 DIAGNOSIS — I70202 Unspecified atherosclerosis of native arteries of extremities, left leg: Secondary | ICD-10-CM

## 2022-03-03 ENCOUNTER — Inpatient Hospital Stay: Payer: Medicare Other

## 2022-03-03 ENCOUNTER — Ambulatory Visit: Payer: Medicare Other | Admitting: Oncology

## 2022-03-03 ENCOUNTER — Inpatient Hospital Stay (HOSPITAL_BASED_OUTPATIENT_CLINIC_OR_DEPARTMENT_OTHER): Payer: Medicare Other | Admitting: Oncology

## 2022-03-03 ENCOUNTER — Encounter (HOSPITAL_COMMUNITY): Payer: Self-pay | Admitting: Internal Medicine

## 2022-03-03 ENCOUNTER — Encounter: Payer: Self-pay | Admitting: Oncology

## 2022-03-03 ENCOUNTER — Inpatient Hospital Stay: Payer: Medicare Other | Attending: Oncology

## 2022-03-03 ENCOUNTER — Ambulatory Visit: Payer: Medicare Other | Admitting: Internal Medicine

## 2022-03-03 VITALS — BP 96/43 | HR 63 | Temp 98.7°F | Resp 18 | Wt 233.9 lb

## 2022-03-03 VITALS — BP 149/52 | HR 59 | Resp 18

## 2022-03-03 DIAGNOSIS — D631 Anemia in chronic kidney disease: Secondary | ICD-10-CM | POA: Diagnosis not present

## 2022-03-03 DIAGNOSIS — M549 Dorsalgia, unspecified: Secondary | ICD-10-CM | POA: Diagnosis not present

## 2022-03-03 DIAGNOSIS — D62 Acute posthemorrhagic anemia: Secondary | ICD-10-CM

## 2022-03-03 DIAGNOSIS — Z87891 Personal history of nicotine dependence: Secondary | ICD-10-CM | POA: Diagnosis not present

## 2022-03-03 DIAGNOSIS — N184 Chronic kidney disease, stage 4 (severe): Secondary | ICD-10-CM

## 2022-03-03 DIAGNOSIS — R5383 Other fatigue: Secondary | ICD-10-CM | POA: Insufficient documentation

## 2022-03-03 DIAGNOSIS — Z7952 Long term (current) use of systemic steroids: Secondary | ICD-10-CM | POA: Insufficient documentation

## 2022-03-03 LAB — CBC WITH DIFFERENTIAL/PLATELET
Abs Immature Granulocytes: 0.02 10*3/uL (ref 0.00–0.07)
Basophils Absolute: 0 10*3/uL (ref 0.0–0.1)
Basophils Relative: 0 %
Eosinophils Absolute: 0.2 10*3/uL (ref 0.0–0.5)
Eosinophils Relative: 3 %
HCT: 33.7 % — ABNORMAL LOW (ref 36.0–46.0)
Hemoglobin: 10.4 g/dL — ABNORMAL LOW (ref 12.0–15.0)
Immature Granulocytes: 0 %
Lymphocytes Relative: 18 %
Lymphs Abs: 1.2 10*3/uL (ref 0.7–4.0)
MCH: 29.2 pg (ref 26.0–34.0)
MCHC: 30.9 g/dL (ref 30.0–36.0)
MCV: 94.7 fL (ref 80.0–100.0)
Monocytes Absolute: 0.4 10*3/uL (ref 0.1–1.0)
Monocytes Relative: 7 %
Neutro Abs: 4.7 10*3/uL (ref 1.7–7.7)
Neutrophils Relative %: 72 %
Platelets: 187 10*3/uL (ref 150–400)
RBC: 3.56 MIL/uL — ABNORMAL LOW (ref 3.87–5.11)
RDW: 13.2 % (ref 11.5–15.5)
WBC: 6.5 10*3/uL (ref 4.0–10.5)
nRBC: 0 % (ref 0.0–0.2)

## 2022-03-03 LAB — SAMPLE TO BLOOD BANK

## 2022-03-03 LAB — BASIC METABOLIC PANEL
Anion gap: 7 (ref 5–15)
BUN: 34 mg/dL — ABNORMAL HIGH (ref 8–23)
CO2: 29 mmol/L (ref 22–32)
Calcium: 9 mg/dL (ref 8.9–10.3)
Chloride: 102 mmol/L (ref 98–111)
Creatinine, Ser: 1.96 mg/dL — ABNORMAL HIGH (ref 0.44–1.00)
GFR, Estimated: 25 mL/min — ABNORMAL LOW (ref >60)
Glucose, Bld: 113 mg/dL — ABNORMAL HIGH (ref 70–99)
Potassium: 3.1 mmol/L — ABNORMAL LOW (ref 3.5–5.1)
Sodium: 138 mmol/L (ref 135–145)

## 2022-03-03 MED ORDER — SODIUM CHLORIDE 0.9 % IV SOLN
Freq: Once | INTRAVENOUS | Status: AC
  Filled 2022-03-03: qty 250

## 2022-03-03 MED ORDER — SODIUM CHLORIDE 0.9 % IV SOLN
200.0000 mg | Freq: Once | INTRAVENOUS | Status: AC
  Administered 2022-03-03: 200 mg via INTRAVENOUS
  Filled 2022-03-03: qty 200

## 2022-03-03 NOTE — Assessment & Plan Note (Addendum)
Labs reviewed and discussed with patient. 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 Status post PRBC transfusion and 1 dose of Venofer during hospitalization currently. Bone marrow biopsy results were reviewed and discussed with patient.  Overall hypercellular marrow, no significant dysplastic features.  Cytogenetic normal. Hemoglobin has improved to 10.4.  I recommend another dose of Venofer 200 mg x 1 today. Repeat CBC, iron TIBC ferritin in 2 weeks. If hemoglobin is less than 10 despite improvement of iron stores in the future, recommend erythropoietin replacement therapy.  Currently she does not need.  Patient agrees with the plan.

## 2022-03-03 NOTE — Addendum Note (Signed)
Addended by: [name] on: 03/03/2022 09:30 PM   Modules accepted: Orders

## 2022-03-03 NOTE — Assessment & Plan Note (Signed)
Encourage oral hydration and avoid nephrotoxins.

## 2022-03-03 NOTE — Progress Notes (Signed)
Hematology/Oncology Consult note [facility] Telephone:([phone] [phone]   Patient Care Team: Hamrick, Lorin Mercy, MD as PCP - General (Family Medicine)   CHIEF COMPLAINTS/REASON FOR VISIT:  Anemia due to CKD  ASSESSMENT & PLAN:   Anemia of chronic kidney failure, stage 4 (severe) ([place]) Labs reviewed and discussed with patient. 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 Status post PRBC transfusion and 1 dose of Venofer during hospitalization currently. Bone marrow biopsy results were reviewed and discussed with patient.  Overall hypercellular marrow, no significant dysplastic.  Cytogenetic normal. Hemoglobin has improved to 10.4.  I recommend another dose of Venofer 200 mg x 1 today. Repeat CBC, iron TIBC ferritin in 2 weeks. If hemoglobin is less than 10 despite improvement of iron stores in the future, recommend erythropoietin replacement therapy.  Currently she does not need.  Patient agrees with the plan.  CKD (chronic kidney disease) stage 4, GFR 15-29 ml/min (HCC) Encourage oral hydration and avoid nephrotoxins.     Orders Placed This Encounter  Procedures   CBC with Differential/Platelet    Standing Status:   Future    Standing Expiration Date:   03/04/2023   Ferritin    Standing Status:   Future    Standing Expiration Date:   03/04/2023   Iron and TIBC    Standing Status:   Future    Standing Expiration Date:   03/04/2023   Sample to Blood Bank    Standing Status:   Future    Standing Expiration Date:   03/04/2023   Follow-up, repeat labs in 2 weeks and future plan depending on lab work.  All questions were answered. The patient knows to call the clinic with any problems, questions or concerns.  [name], MD, PhD [facility] Hematology Oncology 03/03/2022    HISTORY OF PRESENTING ILLNESS:  Rebecca Gilmore is a  81 y.o.  female with PMH listed below who was referred to me for evaluation of anemia Reviewed  patient's recent labs that was done.  10/17/2018 labs revealed anemia with hemoglobin of 12.4 Free kappa light chain 5.35, lambda light chain 2.23, kappa lambda ratio 2.31. SPEP showed faint monoclonal component typed as IgG kappa.  Faint band suspicious for monoclonal components No aggravating or improving factors.  Associated signs and symptoms: Chronic fatigue.  No exacerbating or alleviating factors. . Patient has chronic kidney disease stage III follow-up with [facility] nephrology Dr. 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  Has been on chronic steroids.She reports her current prednisone regimen is 5 mg alternating with 2.5 mg. Chronic joint, neck, back pains.  INTERVAL HISTORY Rebecca Gilmore is a 81 y.o. female who has above history reviewed by me today presents for follow up of anemia due to CKD She was last seen by me on 02/06/2020. Patient was recently hospitalized from 02/18/2022 - 02/20/2022 due to anemia.  Patient had extensive GI workup including EGD, colonoscopy, capsule study which were negative.  Patient was seen by Dr. Rogue Bussing during hospitalization.  Patient presented with a hemoglobin of 6.5, status post PRBC transfusion and 1 dose of Venofer 300 mg daily.  Tolerated well. She also had a bone marrow biopsy done and present to discuss results.  She has felt improvement of her fatigue level since discharge.  No new complaints.  Denies any nausea vomiting diarrhea, blood in the stool.   Review of Systems  Constitutional:  Positive for fatigue. Negative for appetite change, chills and fever.  HENT:  Negative for hearing loss and voice change.   Eyes:  Negative for eye problems.  Respiratory:  Negative for chest tightness and cough.   Cardiovascular:  Negative for chest pain.  Gastrointestinal:  Negative for abdominal distention, abdominal pain and blood in stool.  Endocrine: Negative for hot flashes.  Genitourinary:  Negative for difficulty  urinating and frequency.   Musculoskeletal:  Positive for arthralgias, back pain and neck pain.  Skin:  Negative for itching and rash.  Neurological:  Negative for extremity weakness.  Hematological:  Negative for adenopathy.  Psychiatric/Behavioral:  Negative for confusion.     MEDICAL HISTORY:  Past Medical History:  Diagnosis Date   Depression    HBP (high blood pressure)    History of bladder problems    Memory loss    Muscle pain    Osteoporosis    Reflux    Sinus congestion    Swelling    B/L FEET AND LEGS    SURGICAL HISTORY: Past Surgical History:  Procedure Laterality Date   ARTERY BIOPSY Bilateral 12/01/2016   Procedure: BIOPSY TEMPORAL ARTERY;  Surgeon: Conrad Dailey, MD;  Location: [place];  Service: Vascular;  Laterality: Bilateral;   CATARACT SURGERY     COLONOSCOPY WITH PROPOFOL N/A 12/13/2021   Procedure: COLONOSCOPY WITH PROPOFOL;  Surgeon: Jonathon Bellows, MD;  Location: [facility] ENDOSCOPY;  Service: Gastroenterology;  Laterality: N/A;   ESOPHAGOGASTRODUODENOSCOPY (EGD) WITH PROPOFOL N/A 12/12/2021   Procedure: ESOPHAGOGASTRODUODENOSCOPY (EGD) WITH PROPOFOL;  Surgeon: [name], DO;  Location: [facility] ENDOSCOPY;  Service: Gastroenterology;  Laterality: N/A;   EYELID SURGERY Bilateral    GIVENS CAPSULE STUDY N/A 12/15/2021   Procedure: GIVENS CAPSULE STUDY;  Surgeon: [name], DO;  Location: [facility] ENDOSCOPY;  Service: Gastroenterology;  Laterality: N/A;   GIVENS CAPSULE STUDY N/A 01/28/2022   Procedure: GIVENS CAPSULE STUDY;  Surgeon: [name], MD;  Location: [facility] ENDOSCOPY;  Service: Gastroenterology;  Laterality: N/A;   GROWTH ON FACE     KNEE SURGERY Right    THROMBECTOMY FEMORAL ARTERY Left 11/16/2021   Procedure: THROMBECTOMY FEMORAL ARTERY;  Surgeon: Conrad Wibaux, MD;  Location: [facility] ORS;  Service: Vascular;  Laterality: Left;    SOCIAL HISTORY: Social History   Socioeconomic History   Marital status: Married    Spouse name:  Not on file   Number of children: Not on file   Years of education: Not on file   Highest education level: Not on file  Occupational History   Not on file  Tobacco Use   Smoking status: Former    Types: Cigarettes    Quit date: 01/12/1993    Years since quitting: 29.1   Smokeless tobacco: Former    Types: Snuff, Chew   Tobacco comments:    DATE QUIT UNKNOWN  Vaping Use   Vaping Use: Never used  Substance and Sexual Activity   Alcohol use: No   Drug use: No   Sexual activity: Not on file  Other Topics Concern   Not on file  Social History Narrative   Not on file   Social Determinants of Health   Financial Resource Strain: Not on file  Food Insecurity: No Food Insecurity (02/18/2022)   Hunger Vital Sign    Worried About Running Out of Food in the Last Year: Never true    Ran Out of Food in the Last Year: Never true  Transportation Needs: No Transportation Needs (02/18/2022)   PRAPARE - Transportation    Lack of Transportation (  Medical): No    Lack of Transportation (Non-Medical): No  Physical Activity: Not on file  Stress: Not on file  Social Connections: Not on file  Intimate Partner Violence: Not At Risk (02/18/2022)   Humiliation, Afraid, Rape, and Kick questionnaire    Fear of Current or Ex-Partner: No    Emotionally Abused: No    Physically Abused: No    Sexually Abused: No    FAMILY HISTORY: Family History  Problem Relation Age of Onset   Heart disease Mother    AAA (abdominal aortic aneurysm) Mother    Heart disease Father     ALLERGIES:  is allergic to morphine, morphine and related, kenalog [triamcinolone acetonide], and penicillins.  MEDICATIONS:  Current Outpatient Medications  Medication Sig Dispense Refill   acetaminophen (TYLENOL) 325 MG tablet Take 2 tablets (650 mg total) by mouth every 6 (six) hours as needed for mild pain or fever.     amitriptyline (ELAVIL) 25 MG tablet Take 25 mg by mouth at bedtime.     cholecalciferol (VITAMIN D3) 25  MCG (1000 UT) tablet Take 1,000 Units by mouth daily.     cyanocobalamin (VITAMIN B12) 1000 MCG tablet Take 1,000 mcg by mouth.     Ferrous Sulfate (IRON) 325 (65 Fe) MG TABS Take 1 tablet by mouth daily.     loratadine (CLARITIN) 10 MG tablet Take 10 mg by mouth daily as needed.     pantoprazole (PROTONIX) 40 MG tablet Take 40 mg by mouth every morning.     pregabalin (LYRICA) 75 MG capsule Take 1 capsule (75 mg total) by mouth daily. 30 capsule 0   rosuvastatin (CRESTOR) 5 MG tablet Take 5 mg by mouth daily.     sertraline (ZOLOFT) 50 MG tablet Take 25 mg by mouth daily.  0   torsemide (DEMADEX) 100 MG tablet Take 0.5 tablets (50 mg total) by mouth daily. Increase to 1-1.5 tablet (100-150 mg total) by mouth ONCE daily (total daily dose 100-150 mg) as needed for up to 3 days for increased leg swelling, shortness of breath, weight gain 5+ lbs over 1-2 days. Seek medical care if these symptoms are not improving with increased dose.     metoprolol succinate (TOPROL-XL) 25 MG 24 hr tablet Take 0.5 tablets (12.5 mg total) by mouth daily. Discuss with your doctor if u need to continue this med (Patient not taking: Reported on 03/03/2022)     predniSONE (DELTASONE) 1 MG tablet Take 1 mg by mouth daily. (Patient not taking: Reported on 03/03/2022)     No current facility-administered medications for this visit.     PHYSICAL EXAMINATION: ECOG PERFORMANCE STATUS: 1 - Symptomatic but completely ambulatory Vitals:   03/03/22 1504  BP: (!) 96/43  Pulse: 63  Resp: 18  Temp: 98.7 F (37.1 C)   Filed Weights   03/03/22 1504  Weight: 233 lb 14.4 oz (106.1 kg)    Physical Exam Constitutional:      General: She is not in acute distress.    Appearance: She is obese.  HENT:     Head: Normocephalic and atraumatic.  Eyes:     General: No scleral icterus.    Pupils: Pupils are equal, round, and reactive to light.  Cardiovascular:     Rate and Rhythm: Normal rate.  Pulmonary:     Effort: Pulmonary  effort is normal. No respiratory distress.  Abdominal:     General: Bowel sounds are normal. There is no distension.     Palpations: Abdomen  is soft.  Musculoskeletal:        General: No deformity. Normal range of motion.     Cervical back: Normal range of motion.  Skin:    General: Skin is warm and dry.     Findings: No erythema or rash.  Neurological:     Mental Status: She is alert and oriented to person, place, and time. Mental status is at baseline.  Psychiatric:        Thought Content: Thought content normal.      LABORATORY DATA:  I have reviewed the data as listed Lab Results  Component Value Date   WBC 6.5 03/03/2022   HGB 10.4 (L) 03/03/2022   HCT 33.7 (L) 03/03/2022   MCV 94.7 03/03/2022   PLT 187 03/03/2022   Recent Labs    11/16/21 0430 11/17/21 0627 01/27/22 1027 01/28/22 0149 02/18/22 1203 02/19/22 0135 03/03/22 1437  NA 143   < > 141   < > 139 141 138  K 3.6   < > 2.9*   < > 3.3* 4.2 3.1*  CL 106   < > 103   < > 104 106 102  CO2 29   < > 29   < > _0 GLUCOSE 122*   < > 108*   < > 109* 135* 113*  BUN 35*   < > 35*   < > 29* 33* 34*  CREATININE 1.68*   < > 1.70*   < > 1.69* 1.90* 1.96*  CALCIUM 9.4   < > 9.5   < > 8.8* 8.8* 9.0  GFRNONAA 30*   < > 30*   < > 30* 26* 25*  PROT 7.2  --  7.0  --  6.4*  --   --   ALBUMIN 3.7  --  3.7  --  3.1*  --   --   AST 18  --  22  --  19  --   --   ALT 11  --  8  --  8  --   --   ALKPHOS 47  --  44  --  49  --   --   BILITOT 0.6  --  0.7  --  0.9  --   --    < > = values in this interval not displayed.    Iron/TIBC/Ferritin/ %Sat    Component Value Date/Time   IRON 56 12/11/2021 0932   TIBC 322 12/11/2021 0932   FERRITIN 20 12/11/2021 0932   IRONPCTSAT 17 12/11/2021 0932

## 2022-03-03 NOTE — Progress Notes (Signed)
Pt here to re establish care with Dr. Tasia Catchings.

## 2022-03-04 ENCOUNTER — Ambulatory Visit (INDEPENDENT_AMBULATORY_CARE_PROVIDER_SITE_OTHER): Payer: Medicare Other

## 2022-03-04 ENCOUNTER — Ambulatory Visit (INDEPENDENT_AMBULATORY_CARE_PROVIDER_SITE_OTHER): Payer: Medicare Other | Admitting: Nurse Practitioner

## 2022-03-04 ENCOUNTER — Encounter (INDEPENDENT_AMBULATORY_CARE_PROVIDER_SITE_OTHER): Payer: Self-pay | Admitting: Nurse Practitioner

## 2022-03-04 VITALS — BP 117/48 | HR 56 | Resp 14 | Ht 62.0 in | Wt 235.0 lb

## 2022-03-04 DIAGNOSIS — I890 Lymphedema, not elsewhere classified: Secondary | ICD-10-CM | POA: Diagnosis not present

## 2022-03-04 DIAGNOSIS — M25552 Pain in left hip: Secondary | ICD-10-CM

## 2022-03-04 DIAGNOSIS — I70202 Unspecified atherosclerosis of native arteries of extremities, left leg: Secondary | ICD-10-CM

## 2022-03-05 ENCOUNTER — Inpatient Hospital Stay: Payer: Medicare Other

## 2022-03-08 ENCOUNTER — Encounter (INDEPENDENT_AMBULATORY_CARE_PROVIDER_SITE_OTHER): Payer: Self-pay | Admitting: Nurse Practitioner

## 2022-03-08 NOTE — Progress Notes (Signed)
Subjective:    Patient ID: [name], female    DOB: 15-Nov-1940, 81 y.o.   MRN: [id number] Chief Complaint  Patient presents with   Follow-up    ultrasound     adelena desantiago is an 81 year old female who presents today for follow-up evaluation of her left femoral endarterectomy.  The previous small open wound in her groin is healed at this time.  The patient has had been having severe fatigue since returning home and was found to be anemic.  She has had her Eliquis stopped, but has been Plavix every other day.  There has been no rest pain or claudication-like symptoms.  She has recently been working with hematology due to her symptomatic anemia.  She also continues to note issues with having hip pain.  Hip pain has been present since before her intervention.  She also endorses having swelling of her bilateral lower extremities.  She has been utilizing compression and elevation and despite this her swelling has not improved.  Today the patient has ABI of 1.25 on the right and 1.15 on the left.  She has good triphasic tibial artery waveforms bilaterally with good toe waveforms bilaterally.     Review of Systems  Constitutional:  Positive for fatigue.  Cardiovascular:  Positive for leg swelling.  Musculoskeletal:  Positive for arthralgias.  Neurological:  Positive for weakness.  All other systems reviewed and are negative.      Objective:   Physical Exam Vitals reviewed.  HENT:     Head: Normocephalic.  Cardiovascular:     Rate and Rhythm: Normal rate.     Pulses:          Dorsalis pedis pulses are 1+ on the right side and 1+ on the left side.       Posterior tibial pulses are 1+ on the right side and 1+ on the left side.  Pulmonary:     Effort: Pulmonary effort is normal.  Musculoskeletal:     Right lower leg: Edema present.     Left lower leg: Edema present.  Skin:    General: Skin is warm and dry.     Comments: Hyperpigmentation  Neurological:     Mental Status:  She is alert and oriented to person, place, and time.  Psychiatric:        Mood and Affect: Mood normal.        Behavior: Behavior normal.        Thought Content: Thought content normal.        Judgment: Judgment normal.     BP (!) 117/48 (BP Location: Right Arm)   Pulse (!) 56   Resp 14   Ht _0  (1.575 m)   Wt 235 lb (106.6 kg)   BMI 42.98 kg/m   Past Medical History:  Diagnosis Date   Depression    HBP (high blood pressure)    History of bladder problems    Memory loss    Muscle pain    Osteoporosis    Reflux    Sinus congestion    Swelling    B/L FEET AND LEGS    Social History   Socioeconomic History   Marital status: Married    Spouse name: Not on file   Number of children: Not on file   Years of education: Not on file   Highest education level: Not on file  Occupational History   Not on file  Tobacco Use   Smoking status: Former  Types: Cigarettes    Quit date: 01/12/1993    Years since quitting: 29.1   Smokeless tobacco: Former    Types: Snuff, Chew   Tobacco comments:    DATE QUIT UNKNOWN  Vaping Use   Vaping Use: Never used  Substance and Sexual Activity   Alcohol use: No   Drug use: No   Sexual activity: Not on file  Other Topics Concern   Not on file  Social History Narrative   Not on file   Social Determinants of Health   Financial Resource Strain: Not on file  Food Insecurity: No Food Insecurity (02/18/2022)   Hunger Vital Sign    Worried About Running Out of Food in the Last Year: Never true    Ran Out of Food in the Last Year: Never true  Transportation Needs: No Transportation Needs (02/18/2022)   PRAPARE - Hydrologist (Medical): No    Lack of Transportation (Non-Medical): No  Physical Activity: Not on file  Stress: Not on file  Social Connections: Not on file  Intimate Partner Violence: Not At Risk (02/18/2022)   Humiliation, Afraid, Rape, and Kick questionnaire    Fear of Current or  Ex-Partner: No    Emotionally Abused: No    Physically Abused: No    Sexually Abused: No    Past Surgical History:  Procedure Laterality Date   ARTERY BIOPSY Bilateral 12/01/2016   Procedure: BIOPSY TEMPORAL ARTERY;  Surgeon: Conrad La Follette, MD;  Location: [place];  Service: Vascular;  Laterality: Bilateral;   CATARACT SURGERY     COLONOSCOPY WITH PROPOFOL N/A 12/13/2021   Procedure: COLONOSCOPY WITH PROPOFOL;  Surgeon: Jonathon Bellows, MD;  Location[facility] ENDOSCOPY;  Service: Gastroenterology;  Laterality: N/A;   ESOPHAGOGASTRODUODENOSCOPY (EGD) WITH PROPOFOL N/A 12/12/2021   Procedure: ESOPHAGOGASTRODUODENOSCOPY (EGD) WITH PROPOFOL;  Surgeon: [name], DO;  Location: [facility] ENDOSCOPY;  Service: Gastroenterology;  Laterality: N/A;   EYELID SURGERY Bilateral    GIVENS CAPSULE STUDY N/A 12/15/2021   Procedure: GIVENS CAPSULE STUDY;  Surgeon: [name], DO;  Location: [facility] ENDOSCOPY;  Service: Gastroenterology;  Laterality: N/A;   GIVENS CAPSULE STUDY N/A 01/28/2022   Procedure: GIVENS CAPSULE STUDY;  Surgeon: [name], MD;  Location: [facility] ENDOSCOPY;  Service: Gastroenterology;  Laterality: N/A;   GROWTH ON FACE     KNEE SURGERY Right    THROMBECTOMY FEMORAL ARTERY Left 11/16/2021   Procedure: THROMBECTOMY FEMORAL ARTERY;  Surgeon: Conrad [employer], MD;  Location: [facility] ORS;  Service: Vascular;  Laterality: Left;    Family History  Problem Relation Age of Onset   Heart disease Mother    AAA (abdominal aortic aneurysm) Mother    Heart disease Father     Allergies  Allergen Reactions   Morphine Hives and Itching    Other Reaction(s): Confusion   Morphine And Related Itching   Kenalog [Triamcinolone Acetonide] Other (See Comments)    FLUSH IN FACE   Penicillins Other (See Comments)    Has patient had a PCN reaction causing immediate rash, facial/tongue/throat swelling, SOB or lightheadedness with hypotension: Unknown Has patient had a PCN reaction causing severe  rash involving mucus membranes or skin necrosis: Unknown Has patient had a PCN reaction that required hospitalization:No Has patient had a PCN reaction occurring within the last 10 years: No If all of the above answers are "NO", then may proceed with Cephalosporin use.        Latest Ref Rng & Units 03/03/2022  2:37 PM 02/20/2022    7:42 PM 02/20/2022    5:26 AM  CBC  WBC 4.0 - 10.5 K/uL 6.5     Hemoglobin 12.0 - 15.0 g/dL 10.4  9.6  7.7   Hematocrit 36.0 - 46.0 % 33.7  30.5  25.3   Platelets 150 - 400 K/uL 187         CMP     Component Value Date/Time   NA 138 03/03/2022 1437   K 3.1 (L) 03/03/2022 1437   K 3.8 06/06/2012 1632   CL 102 03/03/2022 1437   CO2 29 03/03/2022 1437   GLUCOSE 113 (H) 03/03/2022 1437   BUN 34 (H) 03/03/2022 1437   CREATININE 1.96 (H) 03/03/2022 1437   CALCIUM 9.0 03/03/2022 1437   PROT 6.4 (L) 02/18/2022 1203   ALBUMIN 3.1 (L) 02/18/2022 1203   AST 19 02/18/2022 1203   ALT 8 02/18/2022 1203   ALKPHOS 49 02/18/2022 1203   BILITOT 0.9 02/18/2022 1203   GFRNONAA 25 (L) 03/03/2022 1437   GFRAA 37 (L) 01/23/2019 1100     No results found.     Assessment & Plan:   1. 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  The patient does not voice lifestyle limiting changes at this point in time.  Noninvasive studies do not suggest clinically significant change.  No invasive studies, angiography or surgery at this time The patient should continue walking and begin a more formal exercise program.  The patient should continue antiplatelet therapy and aggressive treatment of the lipid abnormalities  No changes in the patient's medications at this time  Continued surveillance is indicated as atherosclerosis is likely to progress with time.    The patient will continue follow up with noninvasive studies as ordered.   Patient will follow-up with [place] in 3  months.  2. Lymphedema Recommend:  No surgery or intervention at this point in time.   The Patient is CEAP [id number].  The patient has been wearing compression for more than 12 weeks with no or little benefit.  The patient has been exercising daily for more than 12 weeks. The patient has been elevating and taking OTC pain medications for more than 12 weeks.  None of these have have eliminated the pain related to the lymphedema or the discomfort regarding excessive swelling and venous congestion.    I have reviewed my discussion with the patient regarding lymphedema and why it  causes symptoms.  Patient will continue wearing graduated compression on a daily basis. The patient should put the compression on first thing in the morning and removing them in the evening. The patient should not sleep in the compression.   In addition, behavioral modification throughout the day will be continued.  This will include frequent elevation (such as in a recliner), use of over the counter pain medications as needed and exercise such as walking.  The systemic causes for chronic edema such as liver, kidney and cardiac etiologies do not appear to have significant changed over the past year.    Despite conservative treatments of at least 4 weeks, including graduated compression therapy class 1 and behavioral modification including exercise and elevation the patient  has not obtained adequate control of the lymphedema.  The patient still has stage 3 lymphedema and therefore, I believe that a lymph pump should be added to improve the control of the patient's lymphedema.  Additionally, a lymph pump is  warranted because it will reduce the risk of cellulitis and ulceration in the future.  Patient should follow-up in six months   3. Pain of left hip Today the patient is noninvasive studies remain consistent.  The patient notes pain in her hip sounds consistent with musculoskeletal cause.  Will refer the patient to  orthopedic surgery for further evaluation. - Ambulatory referral to Orthopedic Surgery    Current Outpatient Medications on File Prior to Visit  Medication Sig Dispense Refill   acetaminophen (TYLENOL) 325 MG tablet Take 2 tablets (650 mg total) by mouth every 6 (six) hours as needed for mild pain or fever.     amitriptyline (ELAVIL) 25 MG tablet Take 25 mg by mouth at bedtime.     cholecalciferol (VITAMIN D3) 25 MCG (1000 UT) tablet Take 1,000 Units by mouth daily.     cyanocobalamin (VITAMIN B12) 1000 MCG tablet Take 1,000 mcg by mouth.     Ferrous Sulfate (IRON) 325 (65 Fe) MG TABS Take 1 tablet by mouth daily.     loratadine (CLARITIN) 10 MG tablet Take 10 mg by mouth daily as needed.     pantoprazole (PROTONIX) 40 MG tablet Take 40 mg by mouth every morning.     pregabalin (LYRICA) 75 MG capsule Take 1 capsule (75 mg total) by mouth daily. 30 capsule 0   rosuvastatin (CRESTOR) 5 MG tablet Take 5 mg by mouth daily.     sertraline (ZOLOFT) 50 MG tablet Take 25 mg by mouth daily.  0   torsemide (DEMADEX) 100 MG tablet Take 0.5 tablets (50 mg total) by mouth daily. Increase to 1-1.5 tablet (100-150 mg total) by mouth ONCE daily (total daily dose 100-150 mg) as needed for up to 3 days for increased leg swelling, shortness of breath, weight gain 5+ lbs over 1-2 days. Seek medical care if these symptoms are not improving with increased dose.     metoprolol succinate (TOPROL-XL) 25 MG 24 hr tablet Take 0.5 tablets (12.5 mg total) by mouth daily. Discuss with your doctor if u need to continue this med (Patient not taking: Reported on 03/03/2022)     predniSONE (DELTASONE) 1 MG tablet Take 1 mg by mouth daily. (Patient not taking: Reported on 03/03/2022)     No current facility-administered medications on file prior to visit.    There are no Patient Instructions on file for this visit. No follow-ups on file.   Kris Hartmann, NP

## 2022-03-09 NOTE — Progress Notes (Unsigned)
   Patient ID: Karelyn Brisby, female    DOB: 08-04-1940, 82 y.o.   MRN: [id number]  HPI  Ms Difranco is a 82 y/o female with a history of  Echo report from 11/17/21 showed an EF of 65-70% along with mild MR and moderate MS.   Admitted 02/18/22 due to SOB and anemia. Bone marrow biopsy done and blood transfusion given. PPI given. Plavix held. IV lasix given. Admitted 01/27/22 due to anemia. Given blood transfusion. S/P  capsule endoscopy without finding of any bleeding source.   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

## 2022-03-10 ENCOUNTER — Ambulatory Visit: Payer: Medicare Other | Attending: Family | Admitting: Family

## 2022-03-10 ENCOUNTER — Encounter: Payer: Self-pay | Admitting: Family

## 2022-03-10 VITALS — BP 101/54 | HR 53 | Resp 18 | Wt 235.2 lb

## 2022-03-10 DIAGNOSIS — I890 Lymphedema, not elsewhere classified: Secondary | ICD-10-CM | POA: Diagnosis not present

## 2022-03-10 DIAGNOSIS — R5383 Other fatigue: Secondary | ICD-10-CM | POA: Diagnosis not present

## 2022-03-10 DIAGNOSIS — F32A Depression, unspecified: Secondary | ICD-10-CM | POA: Diagnosis not present

## 2022-03-10 DIAGNOSIS — I130 Hypertensive heart and chronic kidney disease with heart failure and stage 1 through stage 4 chronic kidney disease, or unspecified chronic kidney disease: Secondary | ICD-10-CM | POA: Insufficient documentation

## 2022-03-10 DIAGNOSIS — D631 Anemia in chronic kidney disease: Secondary | ICD-10-CM | POA: Diagnosis not present

## 2022-03-10 DIAGNOSIS — R0602 Shortness of breath: Secondary | ICD-10-CM | POA: Insufficient documentation

## 2022-03-10 DIAGNOSIS — I10 Essential (primary) hypertension: Secondary | ICD-10-CM

## 2022-03-10 DIAGNOSIS — N189 Chronic kidney disease, unspecified: Secondary | ICD-10-CM | POA: Insufficient documentation

## 2022-03-10 DIAGNOSIS — I482 Chronic atrial fibrillation, unspecified: Secondary | ICD-10-CM | POA: Diagnosis not present

## 2022-03-10 DIAGNOSIS — Z87891 Personal history of nicotine dependence: Secondary | ICD-10-CM | POA: Insufficient documentation

## 2022-03-10 DIAGNOSIS — Z79899 Other long term (current) drug therapy: Secondary | ICD-10-CM | POA: Diagnosis not present

## 2022-03-10 DIAGNOSIS — R059 Cough, unspecified: Secondary | ICD-10-CM | POA: Insufficient documentation

## 2022-03-10 DIAGNOSIS — I5032 Chronic diastolic (congestive) heart failure: Secondary | ICD-10-CM | POA: Diagnosis not present

## 2022-03-10 DIAGNOSIS — M25552 Pain in left hip: Secondary | ICD-10-CM | POA: Insufficient documentation

## 2022-03-10 DIAGNOSIS — D649 Anemia, unspecified: Secondary | ICD-10-CM | POA: Diagnosis not present

## 2022-03-10 DIAGNOSIS — N184 Chronic kidney disease, stage 4 (severe): Secondary | ICD-10-CM

## 2022-03-10 LAB — SURGICAL PATHOLOGY

## 2022-03-10 MED ORDER — POTASSIUM CHLORIDE CRYS ER 20 MEQ PO TBCR: 20.0000 meq | EXTENDED_RELEASE_TABLET | Freq: Every day | ORAL | 3 refills | Status: DC

## 2022-03-10 NOTE — Patient Instructions (Addendum)
Continue weighing daily and call for an overnight weight gain of 3 pounds or more or a weekly weight gain of more than 5 pounds.   If you have voicemail, please make sure your mailbox is cleaned out so that we may leave a message and please make sure to listen to any voicemails.   Start taking potassium as 1/2 tablet daily

## 2022-03-16 MED FILL — Iron Sucrose Inj 20 MG/ML (Fe Equiv): INTRAVENOUS | Qty: 10 | Status: AC

## 2022-03-17 ENCOUNTER — Inpatient Hospital Stay: Payer: Medicare Other

## 2022-03-17 DIAGNOSIS — I214 Non-ST elevation (NSTEMI) myocardial infarction: Secondary | ICD-10-CM | POA: Diagnosis not present

## 2022-03-18 ENCOUNTER — Inpatient Hospital Stay: Payer: Medicare Other | Attending: Oncology

## 2022-03-18 DIAGNOSIS — M549 Dorsalgia, unspecified: Secondary | ICD-10-CM | POA: Diagnosis not present

## 2022-03-18 DIAGNOSIS — Z7952 Long term (current) use of systemic steroids: Secondary | ICD-10-CM | POA: Diagnosis not present

## 2022-03-18 DIAGNOSIS — R5383 Other fatigue: Secondary | ICD-10-CM | POA: Insufficient documentation

## 2022-03-18 DIAGNOSIS — Z87891 Personal history of nicotine dependence: Secondary | ICD-10-CM | POA: Diagnosis not present

## 2022-03-18 DIAGNOSIS — D631 Anemia in chronic kidney disease: Secondary | ICD-10-CM | POA: Insufficient documentation

## 2022-03-18 DIAGNOSIS — N184 Chronic kidney disease, stage 4 (severe): Secondary | ICD-10-CM | POA: Insufficient documentation

## 2022-03-18 LAB — CBC WITH DIFFERENTIAL/PLATELET
Abs Immature Granulocytes: 0.02 10*3/uL (ref 0.00–0.07)
Basophils Absolute: 0 10*3/uL (ref 0.0–0.1)
Basophils Relative: 0 %
Eosinophils Absolute: 0.1 10*3/uL (ref 0.0–0.5)
Eosinophils Relative: 3 %
HCT: 26.4 % — ABNORMAL LOW (ref 36.0–46.0)
Hemoglobin: 8.3 g/dL — ABNORMAL LOW (ref 12.0–15.0)
Immature Granulocytes: 0 %
Lymphocytes Relative: 25 %
Lymphs Abs: 1.2 10*3/uL (ref 0.7–4.0)
MCH: 29.7 pg (ref 26.0–34.0)
MCHC: 31.4 g/dL (ref 30.0–36.0)
MCV: 94.6 fL (ref 80.0–100.0)
Monocytes Absolute: 0.4 10*3/uL (ref 0.1–1.0)
Monocytes Relative: 8 %
Neutro Abs: 3.2 10*3/uL (ref 1.7–7.7)
Neutrophils Relative %: 64 %
Platelets: 230 10*3/uL (ref 150–400)
RBC: 2.79 MIL/uL — ABNORMAL LOW (ref 3.87–5.11)
RDW: 14.7 % (ref 11.5–15.5)
WBC: 5 10*3/uL (ref 4.0–10.5)
nRBC: 0 % (ref 0.0–0.2)

## 2022-03-18 LAB — SAMPLE TO BLOOD BANK

## 2022-03-18 LAB — FERRITIN: Ferritin: 45 ng/mL (ref 11–307)

## 2022-03-18 LAB — IRON AND TIBC
Iron: 220 ug/dL — ABNORMAL HIGH (ref 28–170)
Saturation Ratios: 62 % — ABNORMAL HIGH (ref 10.4–31.8)
TIBC: 356 ug/dL (ref 250–450)
UIBC: 136 ug/dL

## 2022-03-18 LAB — RETIC PANEL
Immature Retic Fract: 16.4 % — ABNORMAL HIGH (ref 2.3–15.9)
RBC.: 2.73 MIL/uL — ABNORMAL LOW (ref 3.87–5.11)
Retic Count, Absolute: 119.3 10*3/uL (ref 19.0–186.0)
Retic Ct Pct: 4.4 % — ABNORMAL HIGH (ref 0.4–3.1)
Reticulocyte Hemoglobin: 28.4 pg (ref >27.9)

## 2022-03-20 ENCOUNTER — Other Ambulatory Visit: Payer: Self-pay | Admitting: Oncology

## 2022-03-20 ENCOUNTER — Telehealth: Payer: Self-pay

## 2022-03-20 DIAGNOSIS — D631 Anemia in chronic kidney disease: Secondary | ICD-10-CM

## 2022-03-20 NOTE — Telephone Encounter (Signed)
Spoke to pt and her daughter Anderson Malta to relay results and MD recommendation. Anderson Malta said she will call back to set up the following appts:   Retacrit (20,000 untis) next weed (diff day from venofer) Venofer weekly x3 Labs in 4 weeks (cbc,iron,ferr) MD/ poss retacrit 1-2 days AFTER labs

## 2022-03-20 NOTE — Telephone Encounter (Signed)
-----  Message from [name], MD sent at 03/20/2022  1:17 PM EST ----- Hemoglobin is still low.  Please arrange her to get retacrit 20,000 units also additional Venofer weekly x3 Follow up in 4 weeks, labs prior to MD + retacrit.  Please order cbc iron tibc ferritin. Thanks.   Rebecca Gilmore

## 2022-03-23 ENCOUNTER — Telehealth: Payer: Self-pay | Admitting: Oncology

## 2022-03-23 NOTE — Telephone Encounter (Addendum)
Yes, we were waiting on pt's daughter to call back. Will forward note to you again.

## 2022-03-23 NOTE — Telephone Encounter (Signed)
recieved call that pt was supposed to be scheduled for appts. Please advise

## 2022-03-24 ENCOUNTER — Encounter: Payer: Self-pay | Admitting: Internal Medicine

## 2022-03-24 ENCOUNTER — Other Ambulatory Visit: Payer: Medicare Other

## 2022-03-25 ENCOUNTER — Inpatient Hospital Stay: Payer: Medicare Other

## 2022-03-31 ENCOUNTER — Inpatient Hospital Stay: Payer: Medicare Other

## 2022-03-31 VITALS — BP 100/60

## 2022-03-31 DIAGNOSIS — D631 Anemia in chronic kidney disease: Secondary | ICD-10-CM | POA: Diagnosis not present

## 2022-03-31 DIAGNOSIS — N184 Chronic kidney disease, stage 4 (severe): Secondary | ICD-10-CM | POA: Diagnosis not present

## 2022-03-31 DIAGNOSIS — Z7952 Long term (current) use of systemic steroids: Secondary | ICD-10-CM | POA: Diagnosis not present

## 2022-03-31 DIAGNOSIS — M549 Dorsalgia, unspecified: Secondary | ICD-10-CM | POA: Diagnosis not present

## 2022-03-31 DIAGNOSIS — Z87891 Personal history of nicotine dependence: Secondary | ICD-10-CM | POA: Diagnosis not present

## 2022-03-31 DIAGNOSIS — R5383 Other fatigue: Secondary | ICD-10-CM | POA: Diagnosis not present

## 2022-03-31 DIAGNOSIS — D62 Acute posthemorrhagic anemia: Secondary | ICD-10-CM

## 2022-03-31 MED ORDER — EPOETIN ALFA-EPBX 10000 UNIT/ML IJ SOLN
20000.0000 [IU] | Freq: Once | INTRAMUSCULAR | Status: AC
  Administered 2022-03-31: 20000 [IU] via SUBCUTANEOUS
  Filled 2022-03-31: qty 2

## 2022-04-01 ENCOUNTER — Inpatient Hospital Stay: Payer: Medicare Other

## 2022-04-01 VITALS — BP 96/49 | HR 69 | Temp 98.2°F | Resp 16

## 2022-04-01 DIAGNOSIS — N184 Chronic kidney disease, stage 4 (severe): Secondary | ICD-10-CM | POA: Diagnosis not present

## 2022-04-01 DIAGNOSIS — M549 Dorsalgia, unspecified: Secondary | ICD-10-CM | POA: Diagnosis not present

## 2022-04-01 DIAGNOSIS — M25552 Pain in left hip: Secondary | ICD-10-CM | POA: Diagnosis not present

## 2022-04-01 DIAGNOSIS — M7062 Trochanteric bursitis, left hip: Secondary | ICD-10-CM | POA: Diagnosis not present

## 2022-04-01 DIAGNOSIS — Z7952 Long term (current) use of systemic steroids: Secondary | ICD-10-CM | POA: Diagnosis not present

## 2022-04-01 DIAGNOSIS — M1612 Unilateral primary osteoarthritis, left hip: Secondary | ICD-10-CM | POA: Diagnosis not present

## 2022-04-01 DIAGNOSIS — D62 Acute posthemorrhagic anemia: Secondary | ICD-10-CM

## 2022-04-01 DIAGNOSIS — D631 Anemia in chronic kidney disease: Secondary | ICD-10-CM | POA: Diagnosis not present

## 2022-04-01 DIAGNOSIS — R5383 Other fatigue: Secondary | ICD-10-CM | POA: Diagnosis not present

## 2022-04-01 DIAGNOSIS — Z87891 Personal history of nicotine dependence: Secondary | ICD-10-CM | POA: Diagnosis not present

## 2022-04-01 MED ORDER — SODIUM CHLORIDE 0.9 % IV SOLN
200.0000 mg | Freq: Once | INTRAVENOUS | Status: AC
  Administered 2022-04-01: 200 mg via INTRAVENOUS
  Filled 2022-04-01: qty 200

## 2022-04-01 MED ORDER — SODIUM CHLORIDE 0.9 % IV SOLN
Freq: Once | INTRAVENOUS | Status: AC
  Filled 2022-04-01: qty 250

## 2022-04-01 NOTE — Patient Instructions (Signed)
Iron Sucrose Injection What is this medication? IRON SUCROSE (EYE ern SOO krose) treats low levels of iron (iron deficiency anemia) in people with kidney disease. Iron is a mineral that plays an important role in making red blood cells, which carry oxygen from your lungs to the rest of your body. This medicine may be used for other purposes; ask your health care provider or pharmacist if you have questions. COMMON BRAND NAME(S): Venofer What should I tell my care team before I take this medication? They need to know if you have any of these conditions: Anemia not caused by low iron levels Heart disease High levels of iron in the blood Kidney disease Liver disease An unusual or allergic reaction to iron, other medications, foods, dyes, or preservatives Pregnant or trying to get pregnant Breastfeeding How should I use this medication? This medication is for infusion into a vein. It is given in a hospital or clinic setting. Talk to your care team about the use of this medication in children. While this medication may be prescribed for children as young as 2 years for selected conditions, precautions do apply. Overdosage: If you think you have taken too much of this medicine contact a poison control center or emergency room at once. NOTE: This medicine is only for you. Do not share this medicine with others. What if I miss a dose? Keep appointments for follow-up doses. It is important not to miss your dose. Call your care team if you are unable to keep an appointment. What may interact with this medication? Do not take this medication with any of the following: Deferoxamine Dimercaprol Other iron products This medication may also interact with the following: Chloramphenicol Deferasirox This list may not describe all possible interactions. Give your health care provider a list of all the medicines, herbs, non-prescription drugs, or dietary supplements you use. Also tell them if you smoke,  drink alcohol, or use illegal drugs. Some items may interact with your medicine. What should I watch for while using this medication? Visit your care team regularly. Tell your care team if your symptoms do not start to get better or if they get worse. You may need blood work done while you are taking this medication. You may need to follow a special diet. Talk to your care team. Foods that contain iron include: whole grains/cereals, dried fruits, beans, or peas, leafy green vegetables, and organ meats (liver, kidney). What side effects may I notice from receiving this medication? Side effects that you should report to your care team as soon as possible: Allergic reactions--skin rash, itching, hives, swelling of the face, lips, tongue, or throat Low blood pressure--dizziness, feeling faint or lightheaded, blurry vision Shortness of breath Side effects that usually do not require medical attention (report to your care team if they continue or are bothersome): Flushing Headache Joint pain Muscle pain Nausea Pain, redness, or irritation at injection site This list may not describe all possible side effects. Call your doctor for medical advice about side effects. You may report side effects to FDA at 1-800-FDA-1088. Where should I keep my medication? This medication is given in a hospital or clinic and will not be stored at home. NOTE: This sheet is a summary. It may not cover all possible information. If you have questions about this medicine, talk to your doctor, pharmacist, or health care provider.  2023 Elsevier/Gold Standard (2020-06-06 00:00:00)

## 2022-04-02 DIAGNOSIS — M1612 Unilateral primary osteoarthritis, left hip: Secondary | ICD-10-CM | POA: Diagnosis not present

## 2022-04-08 ENCOUNTER — Inpatient Hospital Stay: Payer: Medicare Other

## 2022-04-08 VITALS — BP 94/50 | HR 50 | Temp 98.2°F | Resp 16

## 2022-04-08 DIAGNOSIS — N184 Chronic kidney disease, stage 4 (severe): Secondary | ICD-10-CM | POA: Diagnosis not present

## 2022-04-08 DIAGNOSIS — M549 Dorsalgia, unspecified: Secondary | ICD-10-CM | POA: Diagnosis not present

## 2022-04-08 DIAGNOSIS — D62 Acute posthemorrhagic anemia: Secondary | ICD-10-CM

## 2022-04-08 DIAGNOSIS — N189 Chronic kidney disease, unspecified: Secondary | ICD-10-CM | POA: Diagnosis not present

## 2022-04-08 DIAGNOSIS — Z87891 Personal history of nicotine dependence: Secondary | ICD-10-CM | POA: Diagnosis not present

## 2022-04-08 DIAGNOSIS — D631 Anemia in chronic kidney disease: Secondary | ICD-10-CM | POA: Diagnosis not present

## 2022-04-08 DIAGNOSIS — M1612 Unilateral primary osteoarthritis, left hip: Secondary | ICD-10-CM | POA: Diagnosis not present

## 2022-04-08 DIAGNOSIS — R5383 Other fatigue: Secondary | ICD-10-CM | POA: Diagnosis not present

## 2022-04-08 DIAGNOSIS — I5032 Chronic diastolic (congestive) heart failure: Secondary | ICD-10-CM | POA: Diagnosis not present

## 2022-04-08 DIAGNOSIS — M7062 Trochanteric bursitis, left hip: Secondary | ICD-10-CM | POA: Diagnosis not present

## 2022-04-08 DIAGNOSIS — Z7952 Long term (current) use of systemic steroids: Secondary | ICD-10-CM | POA: Diagnosis not present

## 2022-04-08 DIAGNOSIS — I130 Hypertensive heart and chronic kidney disease with heart failure and stage 1 through stage 4 chronic kidney disease, or unspecified chronic kidney disease: Secondary | ICD-10-CM | POA: Diagnosis not present

## 2022-04-08 MED ORDER — SODIUM CHLORIDE 0.9 % IV SOLN
200.0000 mg | Freq: Once | INTRAVENOUS | Status: AC
  Administered 2022-04-08: 200 mg via INTRAVENOUS
  Filled 2022-04-08: qty 200

## 2022-04-08 MED ORDER — SODIUM CHLORIDE 0.9 % IV SOLN
Freq: Once | INTRAVENOUS | Status: AC
  Filled 2022-04-08: qty 250

## 2022-04-14 ENCOUNTER — Telehealth: Payer: Self-pay | Admitting: Family

## 2022-04-14 ENCOUNTER — Encounter: Payer: Medicare Other | Admitting: Family

## 2022-04-14 MED FILL — Iron Sucrose Inj 20 MG/ML (Fe Equiv): INTRAVENOUS | Qty: 10 | Status: AC

## 2022-04-14 NOTE — Progress Notes (Deleted)
Patient ID: Rebecca Gilmore, female    DOB: 04-Dec-1940, 82 y.o.   MRN: [id number]  HPI  Rebecca Gilmore is a 82 y/o female with a history of atrial fibrillation, HTN, CKD, anemia, depression, previous tobacco use and chronic heart failure.   Echo report from 11/17/21 showed an EF of 65-70% along with mild MR and moderate Rebecca.   Admitted 02/18/22 due to SOB and anemia. Bone marrow biopsy done and blood transfusion given. PPI given. Plavix held. IV lasix given. Admitted 01/27/22 due to anemia. Given blood transfusion. S/P  capsule endoscopy without finding of any bleeding source.   She presents today for a follow-up visit with a chief complaint of   Past Medical History:  Diagnosis Date   Anemia    Arrhythmia    atrial fibrillation   CHF (congestive heart failure) (HCC)    Chronic kidney disease    Depression    HBP (high blood pressure)    History of bladder problems    Memory loss    Muscle pain    Osteoporosis    Reflux    Sinus congestion    Swelling    B/L FEET AND LEGS   Past Surgical History:  Procedure Laterality Date   ARTERY BIOPSY Bilateral 12/01/2016   Procedure: BIOPSY TEMPORAL ARTERY;  Surgeon: Rebecca [place], MD;  Location: [place];  Service: Vascular;  Laterality: Bilateral;   CATARACT SURGERY     COLONOSCOPY WITH PROPOFOL N/A 12/13/2021   Procedure: COLONOSCOPY WITH PROPOFOL;  Surgeon: Rebecca Bellows, MD;  Location: [facility] ENDOSCOPY;  Service: Gastroenterology;  Laterality: N/A;   ESOPHAGOGASTRODUODENOSCOPY (EGD) WITH PROPOFOL N/A 12/12/2021   Procedure: ESOPHAGOGASTRODUODENOSCOPY (EGD) WITH PROPOFOL;  Surgeon: Rebecca Helling, DO;  Location: [facility] ENDOSCOPY;  Service: Gastroenterology;  Laterality: N/A;   EYELID SURGERY Bilateral    GIVENS CAPSULE STUDY N/A 12/15/2021   Procedure: GIVENS CAPSULE STUDY;  Surgeon: Rebecca Helling, DO;  Location: [facility] ENDOSCOPY;  Service: Gastroenterology;  Laterality: N/A;   GIVENS CAPSULE STUDY N/A 01/28/2022   Procedure: GIVENS  CAPSULE STUDY;  Surgeon: Rebecca Landsman, MD;  Location: [facility] ENDOSCOPY;  Service: Gastroenterology;  Laterality: N/A;   GROWTH ON FACE     KNEE SURGERY Right    THROMBECTOMY FEMORAL ARTERY Left 11/16/2021   Procedure: THROMBECTOMY FEMORAL ARTERY;  Surgeon: Rebecca Parral, MD;  Location: [facility] ORS;  Service: Vascular;  Laterality: Left;   Family History  Problem Relation Age of Onset   Heart disease Mother    AAA (abdominal aortic aneurysm) Mother    Heart disease Father    Social History   Tobacco Use   Smoking status: Former    Types: Cigarettes    Quit date: 01/12/1993    Years since quitting: 29.2   Smokeless tobacco: Former    Types: Snuff, Chew   Tobacco comments:    DATE QUIT UNKNOWN  Substance Use Topics   Alcohol use: No   Allergies  Allergen Reactions   Morphine Hives and Itching    Other Reaction(s): Confusion   Morphine And Related Itching   Kenalog [Triamcinolone Acetonide] Other (See Comments)    FLUSH IN FACE   Penicillins Other (See Comments)    Has patient had a PCN reaction causing immediate rash, facial/tongue/throat swelling, SOB or lightheadedness with hypotension: Unknown Has patient had a PCN reaction causing severe rash involving mucus membranes or skin necrosis: Unknown Has patient had a PCN reaction that required hospitalization:No Has patient had a PCN reaction occurring  within the last 10 years: No If all of the above answers are "NO", then may proceed with Cephalosporin use.     Review of Systems  Constitutional:  Positive for fatigue (easily). Negative for appetite change.  Eyes: Negative.   Respiratory:  Positive for cough (dry) and shortness of breath (with moderate exertion). Negative for chest tightness.   Cardiovascular:  Positive for leg swelling. Negative for chest pain and palpitations.  Gastrointestinal:  Negative for abdominal distention and abdominal pain.  Endocrine: Negative.   Musculoskeletal:  Positive for arthralgias (left  hip pain).  Skin: Negative.   Allergic/Immunologic: Negative.   Neurological:  Negative for dizziness and light-headedness.  Psychiatric/Behavioral:  Negative for sleep disturbance (sleeping in recliner due to comfort).      Physical Exam Vitals and nursing note reviewed. Exam conducted with a chaperone present (daughter).  Constitutional:      Appearance: Normal appearance.  HENT:     Head: Normocephalic and atraumatic.  Cardiovascular:     Rate and Rhythm: Regular rhythm. Bradycardia present.  Pulmonary:     Effort: Pulmonary effort is normal.     Breath sounds: No wheezing, rhonchi or rales.  Abdominal:     General: There is no distension.     Palpations: Abdomen is soft.     Tenderness: There is no abdominal tenderness.  Musculoskeletal:        General: No tenderness.     Cervical back: Normal range of motion and neck supple.     Right lower leg: Edema (2+ pitting) present.     Left lower leg: Edema (2+ pitting) present.  Skin:    General: Skin is warm and dry.  Neurological:     General: No focal deficit present.     Mental Status: She is alert and oriented to person, place, and time.  Psychiatric:        Mood and Affect: Mood normal.        Behavior: Behavior normal.        Thought Content: Thought content normal.   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- - NYHA class III - euvolemic today - weighing daily; reminded to call for an overnight weight gain of > 2 pounds or a weekly weight gain of > 5 pounds - weight 235.4 pounds from last visit here 1 month ago - not adding salt - drinking 33-48 oz water, 16 oz tea and coffee (unknown oz) daily; emphasized keeping total daily fluid intake to 60-64 ounces daily and to measure how many ounces her coffee cup holds - BNP 02/18/22 was 287.3  2: HTN with CKD- - BP  - saw nephrology Rebecca Gilmore) 01/08/22 - sees PCP (Rebecca Gilmore) - BMP 03/03/22 showed sodium 138,  potassium 3.1, creatinine 1.96 and GFR 25 - recheck BMP today as potassium started at last visit  3: Atrial fibrillation- - saw cardiology Rebecca Gilmore) 02/18/22  4: Anemia- - saw hematology Tasia Catchings) 03/03/22 - iron infusion 04/08/22 - saw GI Jerelene Redden) 07/23/21 - Hg 03/03/22 was 10.4  5: Lymphedema- - saw vascular Owens Shark) 03/04/22 - currently wearing compression socks but edema persists - encouraged to elevate legs when sitting for long periods - limited in her ability to exercise due to symptoms - consider compression boots if edema persists   Medication list reviewed.

## 2022-04-14 NOTE — Telephone Encounter (Signed)
Patient did not show for her [facility] Failure [facility] appointment on 04/14/22. Will attempt to reschedule.

## 2022-04-15 ENCOUNTER — Inpatient Hospital Stay: Payer: Medicare Other | Attending: Oncology

## 2022-04-15 VITALS — BP 105/62 | HR 59 | Temp 98.1°F | Resp 16

## 2022-04-15 DIAGNOSIS — M1612 Unilateral primary osteoarthritis, left hip: Secondary | ICD-10-CM | POA: Diagnosis not present

## 2022-04-15 DIAGNOSIS — D631 Anemia in chronic kidney disease: Secondary | ICD-10-CM | POA: Diagnosis not present

## 2022-04-15 DIAGNOSIS — I5032 Chronic diastolic (congestive) heart failure: Secondary | ICD-10-CM | POA: Diagnosis not present

## 2022-04-15 DIAGNOSIS — Z87891 Personal history of nicotine dependence: Secondary | ICD-10-CM | POA: Insufficient documentation

## 2022-04-15 DIAGNOSIS — M7062 Trochanteric bursitis, left hip: Secondary | ICD-10-CM | POA: Diagnosis not present

## 2022-04-15 DIAGNOSIS — Z7952 Long term (current) use of systemic steroids: Secondary | ICD-10-CM | POA: Insufficient documentation

## 2022-04-15 DIAGNOSIS — R5383 Other fatigue: Secondary | ICD-10-CM | POA: Diagnosis not present

## 2022-04-15 DIAGNOSIS — M549 Dorsalgia, unspecified: Secondary | ICD-10-CM | POA: Diagnosis not present

## 2022-04-15 DIAGNOSIS — D62 Acute posthemorrhagic anemia: Secondary | ICD-10-CM

## 2022-04-15 DIAGNOSIS — I130 Hypertensive heart and chronic kidney disease with heart failure and stage 1 through stage 4 chronic kidney disease, or unspecified chronic kidney disease: Secondary | ICD-10-CM | POA: Diagnosis not present

## 2022-04-15 DIAGNOSIS — N189 Chronic kidney disease, unspecified: Secondary | ICD-10-CM | POA: Diagnosis not present

## 2022-04-15 DIAGNOSIS — N184 Chronic kidney disease, stage 4 (severe): Secondary | ICD-10-CM | POA: Diagnosis not present

## 2022-04-15 MED ORDER — SODIUM CHLORIDE 0.9 % IV SOLN
Freq: Once | INTRAVENOUS | Status: AC
  Filled 2022-04-15: qty 250

## 2022-04-15 MED ORDER — SODIUM CHLORIDE 0.9 % IV SOLN
200.0000 mg | Freq: Once | INTRAVENOUS | Status: AC
  Administered 2022-04-15: 200 mg via INTRAVENOUS
  Filled 2022-04-15: qty 200

## 2022-04-17 DIAGNOSIS — I214 Non-ST elevation (NSTEMI) myocardial infarction: Secondary | ICD-10-CM | POA: Diagnosis not present

## 2022-04-21 ENCOUNTER — Inpatient Hospital Stay: Payer: Medicare Other

## 2022-04-21 DIAGNOSIS — Z7952 Long term (current) use of systemic steroids: Secondary | ICD-10-CM | POA: Diagnosis not present

## 2022-04-21 DIAGNOSIS — M5416 Radiculopathy, lumbar region: Secondary | ICD-10-CM | POA: Diagnosis not present

## 2022-04-21 DIAGNOSIS — M549 Dorsalgia, unspecified: Secondary | ICD-10-CM | POA: Diagnosis not present

## 2022-04-21 DIAGNOSIS — D631 Anemia in chronic kidney disease: Secondary | ICD-10-CM | POA: Diagnosis not present

## 2022-04-21 DIAGNOSIS — N184 Chronic kidney disease, stage 4 (severe): Secondary | ICD-10-CM | POA: Diagnosis not present

## 2022-04-21 DIAGNOSIS — M1612 Unilateral primary osteoarthritis, left hip: Secondary | ICD-10-CM | POA: Diagnosis not present

## 2022-04-21 DIAGNOSIS — R5383 Other fatigue: Secondary | ICD-10-CM | POA: Diagnosis not present

## 2022-04-21 DIAGNOSIS — Z87891 Personal history of nicotine dependence: Secondary | ICD-10-CM | POA: Diagnosis not present

## 2022-04-21 LAB — FERRITIN: Ferritin: 98 ng/mL (ref 11–307)

## 2022-04-21 LAB — CBC WITH DIFFERENTIAL/PLATELET
Abs Immature Granulocytes: 0.01 10*3/uL (ref 0.00–0.07)
Basophils Absolute: 0 10*3/uL (ref 0.0–0.1)
Basophils Relative: 0 %
Eosinophils Absolute: 0.1 10*3/uL (ref 0.0–0.5)
Eosinophils Relative: 2 %
HCT: 34.3 % — ABNORMAL LOW (ref 36.0–46.0)
Hemoglobin: 10.7 g/dL — ABNORMAL LOW (ref 12.0–15.0)
Immature Granulocytes: 0 %
Lymphocytes Relative: 26 %
Lymphs Abs: 1.3 10*3/uL (ref 0.7–4.0)
MCH: 28.5 pg (ref 26.0–34.0)
MCHC: 31.2 g/dL (ref 30.0–36.0)
MCV: 91.5 fL (ref 80.0–100.0)
Monocytes Absolute: 0.4 10*3/uL (ref 0.1–1.0)
Monocytes Relative: 9 %
Neutro Abs: 3 10*3/uL (ref 1.7–7.7)
Neutrophils Relative %: 63 %
Platelets: 158 10*3/uL (ref 150–400)
RBC: 3.75 MIL/uL — ABNORMAL LOW (ref 3.87–5.11)
RDW: 14.6 % (ref 11.5–15.5)
WBC: 4.8 10*3/uL (ref 4.0–10.5)
nRBC: 0 % (ref 0.0–0.2)

## 2022-04-21 LAB — IRON AND TIBC
Iron: 65 ug/dL (ref 28–170)
Saturation Ratios: 19 % (ref 10.4–31.8)
TIBC: 351 ug/dL (ref 250–450)
UIBC: 286 ug/dL

## 2022-04-22 DIAGNOSIS — M1612 Unilateral primary osteoarthritis, left hip: Secondary | ICD-10-CM | POA: Diagnosis not present

## 2022-04-22 DIAGNOSIS — N189 Chronic kidney disease, unspecified: Secondary | ICD-10-CM | POA: Diagnosis not present

## 2022-04-22 DIAGNOSIS — M7062 Trochanteric bursitis, left hip: Secondary | ICD-10-CM | POA: Diagnosis not present

## 2022-04-22 DIAGNOSIS — I5032 Chronic diastolic (congestive) heart failure: Secondary | ICD-10-CM | POA: Diagnosis not present

## 2022-04-22 DIAGNOSIS — I130 Hypertensive heart and chronic kidney disease with heart failure and stage 1 through stage 4 chronic kidney disease, or unspecified chronic kidney disease: Secondary | ICD-10-CM | POA: Diagnosis not present

## 2022-04-23 ENCOUNTER — Other Ambulatory Visit: Payer: Self-pay | Admitting: Orthopedic Surgery

## 2022-04-23 DIAGNOSIS — M5416 Radiculopathy, lumbar region: Secondary | ICD-10-CM

## 2022-04-27 DIAGNOSIS — D631 Anemia in chronic kidney disease: Secondary | ICD-10-CM | POA: Diagnosis not present

## 2022-04-27 DIAGNOSIS — N189 Chronic kidney disease, unspecified: Secondary | ICD-10-CM | POA: Diagnosis not present

## 2022-04-27 DIAGNOSIS — I130 Hypertensive heart and chronic kidney disease with heart failure and stage 1 through stage 4 chronic kidney disease, or unspecified chronic kidney disease: Secondary | ICD-10-CM | POA: Diagnosis not present

## 2022-04-27 DIAGNOSIS — M1612 Unilateral primary osteoarthritis, left hip: Secondary | ICD-10-CM | POA: Diagnosis not present

## 2022-04-27 DIAGNOSIS — I872 Venous insufficiency (chronic) (peripheral): Secondary | ICD-10-CM | POA: Diagnosis not present

## 2022-04-27 DIAGNOSIS — I5032 Chronic diastolic (congestive) heart failure: Secondary | ICD-10-CM | POA: Diagnosis not present

## 2022-04-27 DIAGNOSIS — M7062 Trochanteric bursitis, left hip: Secondary | ICD-10-CM | POA: Diagnosis not present

## 2022-04-27 MED FILL — Iron Sucrose Inj 20 MG/ML (Fe Equiv): INTRAVENOUS | Qty: 10 | Status: AC

## 2022-04-28 ENCOUNTER — Ambulatory Visit
Admission: RE | Admit: 2022-04-28 | Discharge: 2022-04-28 | Disposition: A | Discharge status: Home / Self Care | Payer: Medicare Other | Source: Ambulatory Visit | Attending: Orthopedic Surgery | Admitting: Orthopedic Surgery

## 2022-04-28 ENCOUNTER — Inpatient Hospital Stay (HOSPITAL_BASED_OUTPATIENT_CLINIC_OR_DEPARTMENT_OTHER): Payer: Medicare Other | Admitting: Oncology

## 2022-04-28 ENCOUNTER — Encounter: Payer: Self-pay | Admitting: Oncology

## 2022-04-28 ENCOUNTER — Inpatient Hospital Stay: Payer: Medicare Other

## 2022-04-28 VITALS — BP 93/37 | HR 51 | Temp 96.6°F | Wt 217.9 lb

## 2022-04-28 DIAGNOSIS — D631 Anemia in chronic kidney disease: Secondary | ICD-10-CM

## 2022-04-28 DIAGNOSIS — M5126 Other intervertebral disc displacement, lumbar region: Secondary | ICD-10-CM | POA: Diagnosis not present

## 2022-04-28 DIAGNOSIS — M5416 Radiculopathy, lumbar region: Secondary | ICD-10-CM | POA: Diagnosis not present

## 2022-04-28 DIAGNOSIS — N184 Chronic kidney disease, stage 4 (severe): Secondary | ICD-10-CM

## 2022-04-28 DIAGNOSIS — M549 Dorsalgia, unspecified: Secondary | ICD-10-CM | POA: Diagnosis not present

## 2022-04-28 DIAGNOSIS — Z87891 Personal history of nicotine dependence: Secondary | ICD-10-CM | POA: Diagnosis not present

## 2022-04-28 DIAGNOSIS — Z7952 Long term (current) use of systemic steroids: Secondary | ICD-10-CM | POA: Diagnosis not present

## 2022-04-28 DIAGNOSIS — R5383 Other fatigue: Secondary | ICD-10-CM | POA: Diagnosis not present

## 2022-04-28 NOTE — Assessment & Plan Note (Addendum)
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 Bone marrow biopsy results showed hypercellular marrow, no significant dysplastic features.  Cytogenetic normal. Hemoglobin has further  improved to 10.7.  No need for erythropoietin replacement therapy.   Recommend patient to continue oral iron supplementation ferrous sulfate 335m daily. Recommend otc Vitamin C supplementation.

## 2022-04-28 NOTE — Progress Notes (Signed)
Hematology/Oncology Progress note [phone] [phone]      CHIEF COMPLAINTS/REASON FOR VISIT:  Anemia due to CKD  ASSESSMENT & PLAN:   Anemia of chronic kidney failure, stage 4 (severe) (HCC) Likely anemia secondary to chronic kidney disease. Bone marrow biopsy results showed hypercellular marrow, no significant dysplastic features.  Cytogenetic normal. Hemoglobin has further  improved to 10.7.  No need for erythropoietin replacement therapy.   Recommend patient to continue oral iron supplementation ferrous sulfate 334m daily. Recommend otc Vitamin C supplementation.   CKD (chronic kidney disease) stage 4, GFR 15-29 ml/min (HCC) Encourage oral hydration and avoid nephrotoxins.    Orders Placed This Encounter  Procedures   CBC with Differential ([facility])    Standing Status:   Future    Standing Expiration Date:   04/29/2023   Ferritin    Standing Status:   Future    Standing Expiration Date:   04/29/2023   Iron and TIBC    Standing Status:   Future    Standing Expiration Date:   04/29/2023   Retic Panel    Standing Status:   Future    Standing Expiration Date:   04/29/2023   Follow-up in 3 months  All questions were answered. The patient knows to call the clinic with any problems, questions or concerns.  ZEarlie Server MD, PhD [facility] H[facility] Oncology 04/28/2022    HISTORY OF PRESENTING ILLNESS:  Reviewed patient's recent labs that was done.  10/17/2018 labs revealed anemia with hemoglobin of 12.4 Free kappa light chain 5.35, lambda light chain 2.23, kappa lambda ratio 2.31. SPEP showed faint monoclonal component typed as IgG kappa.  Faint band suspicious for monoclonal components No aggravating or improving factors.  Associated signs and symptoms: Chronic fatigue.  No exacerbating or alleviating factors. . Patient has chronic kidney disease stage III [facility] 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  Has been on chronic steroids.She reports her current prednisone regimen is 5 mg alternating with 2.5 mg. Chronic joint, neck, back pains.  hospitalized from 02/18/2022 - 02/20/2022 due to anemia.  Patient had extensive GI workup including EGD, colonoscopy, capsule study which were negative.  Patient was seen by Dr. BRogue Bussingduring hospitalization.  Patient presented with a hemoglobin of 6.5, status post PRBC transfusion and 1 dose of Venofer 300 mg daily.   02/20/22 Bone marrow biopsy showed hypercellular marrow, no significant dysplastic.  Cytogenetic normal.   INTERVAL HISTORY Rebecca Satrianois a 82y.o. female who has above history reviewed by me today presents for follow up of anemia due to CKD She was last seen by me on 02/06/2020. She tolerates IV Venofer treatments. She feels well, fatigue has improved.  \  Review of Systems  Constitutional:  Positive for fatigue. Negative for appetite change, chills and fever.  HENT:   Negative for hearing loss and voice change.   Eyes:  Negative for eye problems.  Respiratory:  Negative for chest tightness and cough.   Cardiovascular:  Negative for chest pain.  Gastrointestinal:  Negative for abdominal distention, abdominal pain and blood in stool.  Endocrine: Negative for hot flashes.  Genitourinary:  Negative for difficulty urinating and frequency.   Musculoskeletal:  Positive for arthralgias, back pain and neck pain.  Skin:  Negative for itching and rash.  Neurological:  Negative for extremity weakness.  Hematological:  Negative for adenopathy.  Psychiatric/Behavioral:  Negative for confusion.     MEDICAL HISTORY:  Past  Medical History:  Diagnosis Date   Anemia    Arrhythmia    atrial fibrillation   CHF (congestive heart failure) (HCC)    Chronic kidney disease    Depression    HBP (high blood pressure)    History of bladder problems    Memory loss    Muscle pain    Osteoporosis    Reflux    Sinus  congestion    Swelling    B/L FEET AND LEGS    SURGICAL HISTORY: Past Surgical History:  Procedure Laterality Date   ARTERY BIOPSY Bilateral 12/01/2016   Procedure: BIOPSY TEMPORAL ARTERY;  Surgeon: Conrad McIntosh, MD;  Location: [place];  Service: Vascular;  Laterality: Bilateral;   CATARACT SURGERY     COLONOSCOPY WITH PROPOFOL N/A 12/13/2021   Procedure: COLONOSCOPY WITH PROPOFOL;  Surgeon: Jonathon Bellows, MD;  Location: [facility] ENDOSCOPY;  Service: Gastroenterology;  Laterality: N/A;   ESOPHAGOGASTRODUODENOSCOPY (EGD) WITH PROPOFOL N/A 12/12/2021   Procedure: ESOPHAGOGASTRODUODENOSCOPY (EGD) WITH PROPOFOL;  Surgeon: [name], DO;  Location: [facility] ENDOSCOPY;  Service: Gastroenterology;  Laterality: N/A;   EYELID SURGERY Bilateral    GIVENS CAPSULE STUDY N/A 12/15/2021   Procedure: GIVENS CAPSULE STUDY;  Surgeon: [name], DO;  Location: [facility] ENDOSCOPY;  Service: Gastroenterology;  Laterality: N/A;   GIVENS CAPSULE STUDY N/A 01/28/2022   Procedure: GIVENS CAPSULE STUDY;  Surgeon: [name], MD;  Location: [facility] ENDOSCOPY;  Service: Gastroenterology;  Laterality: N/A;   GROWTH ON FACE     KNEE SURGERY Right    THROMBECTOMY FEMORAL ARTERY Left 11/16/2021   Procedure: THROMBECTOMY FEMORAL ARTERY;  Surgeon: Conrad Allegan, MD;  Location: [facility] ORS;  Service: Vascular;  Laterality: Left;    SOCIAL HISTORY: Social History   Socioeconomic History   Marital status: Married    Spouse name: Not on file   Number of children: Not on file   Years of education: Not on file   Highest education level: Not on file  Occupational History   Not on file  Tobacco Use   Smoking status: Former    Types: Cigarettes    Quit date: 01/12/1993    Years since quitting: 29.3   Smokeless tobacco: Former    Types: Snuff, Chew   Tobacco comments:    DATE QUIT UNKNOWN  Vaping Use   Vaping Use: Never used  Substance and Sexual Activity   Alcohol use: No   Drug use: No   Sexual  activity: Not on file  Other Topics Concern   Not on file  Social History Narrative   Not on file   Social Determinants of Health   Financial Resource Strain: Not on file  Food Insecurity: No Food Insecurity (02/18/2022)   Hunger Vital Sign    Worried About Running Out of Food in the Last Year: Never true    Ran Out of Food in the Last Year: Never true  Transportation Needs: No Transportation Needs (02/18/2022)   PRAPARE - Hydrologist (Medical): No    Lack of Transportation (Non-Medical): No  Physical Activity: Not on file  Stress: Not on file  Social Connections: Not on file  Intimate Partner Violence: Not At Risk (02/18/2022)   Humiliation, Afraid, Rape, and Kick questionnaire    Fear of Current or Ex-Partner: No    Emotionally Abused: No    Physically Abused: No    Sexually Abused: No    FAMILY HISTORY: Family History  Problem Relation Age  of Onset   Heart disease Mother    AAA (abdominal aortic aneurysm) Mother    Heart disease Father     ALLERGIES:  is allergic to morphine, morphine and related, kenalog [triamcinolone acetonide], and penicillins.  MEDICATIONS:  Current Outpatient Medications  Medication Sig Dispense Refill   acetaminophen (TYLENOL) 325 MG tablet Take 2 tablets (650 mg total) by mouth every 6 (six) hours as needed for mild pain or fever.     amitriptyline (ELAVIL) 25 MG tablet Take 25 mg by mouth at bedtime.     cholecalciferol (VITAMIN D3) 25 MCG (1000 UT) tablet Take 1,000 Units by mouth daily.     cyanocobalamin (VITAMIN B12) 1000 MCG tablet Take 1,000 mcg by mouth.     Ferrous Sulfate (IRON) 325 (65 Fe) MG TABS Take 1 tablet by mouth daily.     loratadine (CLARITIN) 10 MG tablet Take 10 mg by mouth daily as needed.     pantoprazole (PROTONIX) 40 MG tablet Take 40 mg by mouth every morning.     potassium chloride SA (KLOR-CON M) 20 MEQ tablet Take 1 tablet (20 mEq total) by mouth daily. 60 tablet 3   pregabalin  (LYRICA) 75 MG capsule Take 1 capsule (75 mg total) by mouth daily. 30 capsule 0   rosuvastatin (CRESTOR) 5 MG tablet Take 5 mg by mouth daily.     sertraline (ZOLOFT) 50 MG tablet Take 25 mg by mouth daily.  0   torsemide (DEMADEX) 100 MG tablet Take 0.5 tablets (50 mg total) by mouth daily. Increase to 1-1.5 tablet (100-150 mg total) by mouth ONCE daily (total daily dose 100-150 mg) as needed for up to 3 days for increased leg swelling, shortness of breath, weight gain 5+ lbs over 1-2 days. Seek medical care if these symptoms are not improving with increased dose.     No current facility-administered medications for this visit.     PHYSICAL EXAMINATION: ECOG PERFORMANCE STATUS: 1 - Symptomatic but completely ambulatory Vitals:   04/28/22 1309  BP: (!) 93/37  Pulse: (!) 51  Temp: (!) 96.6 F (35.9 C)  SpO2: 98%   Filed Weights   04/28/22 1309  Weight: 217 lb 14.4 oz (98.8 kg)    Physical Exam Constitutional:      General: She is not in acute distress.    Appearance: She is obese.  HENT:     Head: Normocephalic and atraumatic.  Eyes:     General: No scleral icterus.    Pupils: Pupils are equal, round, and reactive to light.  Cardiovascular:     Rate and Rhythm: Normal rate.     Heart sounds: Murmur heard.  Pulmonary:     Effort: Pulmonary effort is normal. No respiratory distress.  Abdominal:     General: Bowel sounds are normal. There is no distension.     Palpations: Abdomen is soft.  Musculoskeletal:        General: No deformity. Normal range of motion.     Cervical back: Normal range of motion.  Skin:    General: Skin is warm and dry.     Findings: No erythema or rash.  Neurological:     Mental Status: She is alert and oriented to person, place, and time. Mental status is at baseline.  Psychiatric:        Thought Content: Thought content normal.      LABORATORY DATA:  I have reviewed the data as listed     Latest Ref Rng & Units  04/21/2022   11:03 AM  03/18/2022   11:47 AM 03/03/2022    2:37 PM  CBC  WBC 4.0 - 10.5 K/uL 4.8  5.0  6.5   Hemoglobin 12.0 - 15.0 g/dL 10.7  8.3  10.4   Hematocrit 36.0 - 46.0 % 34.3  26.4  33.7   Platelets 150 - 400 K/uL 158  230  187       Latest Ref Rng & Units 03/03/2022    2:37 PM 02/19/2022    1:35 AM 02/18/2022   12:03 PM  CMP  Glucose 70 - 99 mg/dL 113  135  109   BUN 8 - 23 mg/dL 34  33  29   Creatinine 0.44 - 1.00 mg/dL 1.96  1.90  1.69   Sodium 135 - 145 mmol/L 138  141  139   Potassium 3.5 - 5.1 mmol/L 3.1  4.2  3.3   Chloride 98 - 111 mmol/L 102  106  104   CO2 22 - 32 mmol/L 29  27  26   $ Calcium 8.9 - 10.3 mg/dL 9.0  8.8  8.8   Total Protein 6.5 - 8.1 g/dL   6.4   Total Bilirubin 0.3 - 1.2 mg/dL   0.9   Alkaline Phos 38 - 126 U/L   49   AST 15 - 41 U/L   19   ALT 0 - 44 U/L   8     Iron/TIBC/Ferritin/ %Sat    Component Value Date/Time   IRON 65 04/21/2022 1103   TIBC 351 04/21/2022 1103   FERRITIN 98 04/21/2022 1103   IRONPCTSAT 19 04/21/2022 1103

## 2022-04-28 NOTE — Assessment & Plan Note (Signed)
Encourage oral hydration and avoid nephrotoxins.   

## 2022-05-05 DIAGNOSIS — M48062 Spinal stenosis, lumbar region with neurogenic claudication: Secondary | ICD-10-CM | POA: Diagnosis not present

## 2022-05-05 DIAGNOSIS — M5442 Lumbago with sciatica, left side: Secondary | ICD-10-CM | POA: Diagnosis not present

## 2022-05-05 DIAGNOSIS — G8929 Other chronic pain: Secondary | ICD-10-CM | POA: Diagnosis not present

## 2022-05-11 DIAGNOSIS — M48062 Spinal stenosis, lumbar region with neurogenic claudication: Secondary | ICD-10-CM | POA: Diagnosis not present

## 2022-05-16 DIAGNOSIS — I214 Non-ST elevation (NSTEMI) myocardial infarction: Secondary | ICD-10-CM | POA: Diagnosis not present

## 2022-05-26 DIAGNOSIS — G8929 Other chronic pain: Secondary | ICD-10-CM | POA: Diagnosis not present

## 2022-05-26 DIAGNOSIS — M48062 Spinal stenosis, lumbar region with neurogenic claudication: Secondary | ICD-10-CM | POA: Diagnosis not present

## 2022-05-26 DIAGNOSIS — M5442 Lumbago with sciatica, left side: Secondary | ICD-10-CM | POA: Diagnosis not present

## 2022-05-28 DIAGNOSIS — E11649 Type 2 diabetes mellitus with hypoglycemia without coma: Secondary | ICD-10-CM | POA: Diagnosis not present

## 2022-05-28 DIAGNOSIS — G8929 Other chronic pain: Secondary | ICD-10-CM | POA: Diagnosis not present

## 2022-05-28 DIAGNOSIS — K219 Gastro-esophageal reflux disease without esophagitis: Secondary | ICD-10-CM | POA: Diagnosis not present

## 2022-05-28 DIAGNOSIS — Z7902 Long term (current) use of antithrombotics/antiplatelets: Secondary | ICD-10-CM | POA: Diagnosis not present

## 2022-05-28 DIAGNOSIS — M80061D Age-related osteoporosis with current pathological fracture, right lower leg, subsequent encounter for fracture with routine healing: Secondary | ICD-10-CM | POA: Diagnosis not present

## 2022-05-28 DIAGNOSIS — G2581 Restless legs syndrome: Secondary | ICD-10-CM | POA: Diagnosis not present

## 2022-05-28 DIAGNOSIS — E1122 Type 2 diabetes mellitus with diabetic chronic kidney disease: Secondary | ICD-10-CM | POA: Diagnosis not present

## 2022-05-28 DIAGNOSIS — N183 Chronic kidney disease, stage 3 unspecified: Secondary | ICD-10-CM | POA: Diagnosis not present

## 2022-05-28 DIAGNOSIS — G43909 Migraine, unspecified, not intractable, without status migrainosus: Secondary | ICD-10-CM | POA: Diagnosis not present

## 2022-05-28 DIAGNOSIS — M5416 Radiculopathy, lumbar region: Secondary | ICD-10-CM | POA: Diagnosis not present

## 2022-05-28 DIAGNOSIS — Z556 Problems related to health literacy: Secondary | ICD-10-CM | POA: Diagnosis not present

## 2022-05-28 DIAGNOSIS — M48062 Spinal stenosis, lumbar region with neurogenic claudication: Secondary | ICD-10-CM | POA: Diagnosis not present

## 2022-05-28 DIAGNOSIS — Z8673 Personal history of transient ischemic attack (TIA), and cerebral infarction without residual deficits: Secondary | ICD-10-CM | POA: Diagnosis not present

## 2022-05-28 DIAGNOSIS — I4891 Unspecified atrial fibrillation: Secondary | ICD-10-CM | POA: Diagnosis not present

## 2022-05-28 DIAGNOSIS — E559 Vitamin D deficiency, unspecified: Secondary | ICD-10-CM | POA: Diagnosis not present

## 2022-05-28 DIAGNOSIS — I252 Old myocardial infarction: Secondary | ICD-10-CM | POA: Diagnosis not present

## 2022-05-28 DIAGNOSIS — Z5982 Transportation insecurity: Secondary | ICD-10-CM | POA: Diagnosis not present

## 2022-05-28 DIAGNOSIS — Z87891 Personal history of nicotine dependence: Secondary | ICD-10-CM | POA: Diagnosis not present

## 2022-05-28 DIAGNOSIS — I872 Venous insufficiency (chronic) (peripheral): Secondary | ICD-10-CM | POA: Diagnosis not present

## 2022-05-28 DIAGNOSIS — M316 Other giant cell arteritis: Secondary | ICD-10-CM | POA: Diagnosis not present

## 2022-05-28 DIAGNOSIS — M1612 Unilateral primary osteoarthritis, left hip: Secondary | ICD-10-CM | POA: Diagnosis not present

## 2022-05-28 DIAGNOSIS — I129 Hypertensive chronic kidney disease with stage 1 through stage 4 chronic kidney disease, or unspecified chronic kidney disease: Secondary | ICD-10-CM | POA: Diagnosis not present

## 2022-05-28 DIAGNOSIS — Z86011 Personal history of benign neoplasm of the brain: Secondary | ICD-10-CM | POA: Diagnosis not present

## 2022-06-01 DIAGNOSIS — M1612 Unilateral primary osteoarthritis, left hip: Secondary | ICD-10-CM | POA: Diagnosis not present

## 2022-06-01 DIAGNOSIS — I4891 Unspecified atrial fibrillation: Secondary | ICD-10-CM | POA: Diagnosis not present

## 2022-06-01 DIAGNOSIS — G43909 Migraine, unspecified, not intractable, without status migrainosus: Secondary | ICD-10-CM | POA: Diagnosis not present

## 2022-06-01 DIAGNOSIS — Z86011 Personal history of benign neoplasm of the brain: Secondary | ICD-10-CM | POA: Diagnosis not present

## 2022-06-01 DIAGNOSIS — G8929 Other chronic pain: Secondary | ICD-10-CM | POA: Diagnosis not present

## 2022-06-01 DIAGNOSIS — I872 Venous insufficiency (chronic) (peripheral): Secondary | ICD-10-CM | POA: Diagnosis not present

## 2022-06-01 DIAGNOSIS — M48062 Spinal stenosis, lumbar region with neurogenic claudication: Secondary | ICD-10-CM | POA: Diagnosis not present

## 2022-06-01 DIAGNOSIS — K219 Gastro-esophageal reflux disease without esophagitis: Secondary | ICD-10-CM | POA: Diagnosis not present

## 2022-06-01 DIAGNOSIS — M5416 Radiculopathy, lumbar region: Secondary | ICD-10-CM | POA: Diagnosis not present

## 2022-06-01 DIAGNOSIS — E1122 Type 2 diabetes mellitus with diabetic chronic kidney disease: Secondary | ICD-10-CM | POA: Diagnosis not present

## 2022-06-01 DIAGNOSIS — N183 Chronic kidney disease, stage 3 unspecified: Secondary | ICD-10-CM | POA: Diagnosis not present

## 2022-06-01 DIAGNOSIS — E11649 Type 2 diabetes mellitus with hypoglycemia without coma: Secondary | ICD-10-CM | POA: Diagnosis not present

## 2022-06-01 DIAGNOSIS — Z7902 Long term (current) use of antithrombotics/antiplatelets: Secondary | ICD-10-CM | POA: Diagnosis not present

## 2022-06-01 DIAGNOSIS — Z8673 Personal history of transient ischemic attack (TIA), and cerebral infarction without residual deficits: Secondary | ICD-10-CM | POA: Diagnosis not present

## 2022-06-01 DIAGNOSIS — E559 Vitamin D deficiency, unspecified: Secondary | ICD-10-CM | POA: Diagnosis not present

## 2022-06-01 DIAGNOSIS — M316 Other giant cell arteritis: Secondary | ICD-10-CM | POA: Diagnosis not present

## 2022-06-01 DIAGNOSIS — M80061D Age-related osteoporosis with current pathological fracture, right lower leg, subsequent encounter for fracture with routine healing: Secondary | ICD-10-CM | POA: Diagnosis not present

## 2022-06-01 DIAGNOSIS — G2581 Restless legs syndrome: Secondary | ICD-10-CM | POA: Diagnosis not present

## 2022-06-01 DIAGNOSIS — Z5982 Transportation insecurity: Secondary | ICD-10-CM | POA: Diagnosis not present

## 2022-06-01 DIAGNOSIS — Z556 Problems related to health literacy: Secondary | ICD-10-CM | POA: Diagnosis not present

## 2022-06-01 DIAGNOSIS — Z87891 Personal history of nicotine dependence: Secondary | ICD-10-CM | POA: Diagnosis not present

## 2022-06-01 DIAGNOSIS — I129 Hypertensive chronic kidney disease with stage 1 through stage 4 chronic kidney disease, or unspecified chronic kidney disease: Secondary | ICD-10-CM | POA: Diagnosis not present

## 2022-06-01 DIAGNOSIS — I252 Old myocardial infarction: Secondary | ICD-10-CM | POA: Diagnosis not present

## 2022-06-02 ENCOUNTER — Other Ambulatory Visit (INDEPENDENT_AMBULATORY_CARE_PROVIDER_SITE_OTHER): Payer: Self-pay | Admitting: Nurse Practitioner

## 2022-06-02 DIAGNOSIS — Z9889 Other specified postprocedural states: Secondary | ICD-10-CM

## 2022-06-02 DIAGNOSIS — M48062 Spinal stenosis, lumbar region with neurogenic claudication: Secondary | ICD-10-CM | POA: Diagnosis not present

## 2022-06-08 DIAGNOSIS — M316 Other giant cell arteritis: Secondary | ICD-10-CM | POA: Diagnosis not present

## 2022-06-08 DIAGNOSIS — Z87891 Personal history of nicotine dependence: Secondary | ICD-10-CM | POA: Diagnosis not present

## 2022-06-08 DIAGNOSIS — Z5982 Transportation insecurity: Secondary | ICD-10-CM | POA: Diagnosis not present

## 2022-06-08 DIAGNOSIS — Z7902 Long term (current) use of antithrombotics/antiplatelets: Secondary | ICD-10-CM | POA: Diagnosis not present

## 2022-06-08 DIAGNOSIS — K219 Gastro-esophageal reflux disease without esophagitis: Secondary | ICD-10-CM | POA: Diagnosis not present

## 2022-06-08 DIAGNOSIS — M1612 Unilateral primary osteoarthritis, left hip: Secondary | ICD-10-CM | POA: Diagnosis not present

## 2022-06-08 DIAGNOSIS — M48062 Spinal stenosis, lumbar region with neurogenic claudication: Secondary | ICD-10-CM | POA: Diagnosis not present

## 2022-06-08 DIAGNOSIS — I872 Venous insufficiency (chronic) (peripheral): Secondary | ICD-10-CM | POA: Diagnosis not present

## 2022-06-08 DIAGNOSIS — Z556 Problems related to health literacy: Secondary | ICD-10-CM | POA: Diagnosis not present

## 2022-06-08 DIAGNOSIS — E559 Vitamin D deficiency, unspecified: Secondary | ICD-10-CM | POA: Diagnosis not present

## 2022-06-08 DIAGNOSIS — I252 Old myocardial infarction: Secondary | ICD-10-CM | POA: Diagnosis not present

## 2022-06-08 DIAGNOSIS — G8929 Other chronic pain: Secondary | ICD-10-CM | POA: Diagnosis not present

## 2022-06-08 DIAGNOSIS — E1122 Type 2 diabetes mellitus with diabetic chronic kidney disease: Secondary | ICD-10-CM | POA: Diagnosis not present

## 2022-06-08 DIAGNOSIS — Z8673 Personal history of transient ischemic attack (TIA), and cerebral infarction without residual deficits: Secondary | ICD-10-CM | POA: Diagnosis not present

## 2022-06-08 DIAGNOSIS — G2581 Restless legs syndrome: Secondary | ICD-10-CM | POA: Diagnosis not present

## 2022-06-08 DIAGNOSIS — E11649 Type 2 diabetes mellitus with hypoglycemia without coma: Secondary | ICD-10-CM | POA: Diagnosis not present

## 2022-06-08 DIAGNOSIS — I129 Hypertensive chronic kidney disease with stage 1 through stage 4 chronic kidney disease, or unspecified chronic kidney disease: Secondary | ICD-10-CM | POA: Diagnosis not present

## 2022-06-08 DIAGNOSIS — I4891 Unspecified atrial fibrillation: Secondary | ICD-10-CM | POA: Diagnosis not present

## 2022-06-08 DIAGNOSIS — M5416 Radiculopathy, lumbar region: Secondary | ICD-10-CM | POA: Diagnosis not present

## 2022-06-08 DIAGNOSIS — G43909 Migraine, unspecified, not intractable, without status migrainosus: Secondary | ICD-10-CM | POA: Diagnosis not present

## 2022-06-08 DIAGNOSIS — M80061D Age-related osteoporosis with current pathological fracture, right lower leg, subsequent encounter for fracture with routine healing: Secondary | ICD-10-CM | POA: Diagnosis not present

## 2022-06-08 DIAGNOSIS — Z86011 Personal history of benign neoplasm of the brain: Secondary | ICD-10-CM | POA: Diagnosis not present

## 2022-06-08 DIAGNOSIS — N183 Chronic kidney disease, stage 3 unspecified: Secondary | ICD-10-CM | POA: Diagnosis not present

## 2022-06-09 ENCOUNTER — Ambulatory Visit (INDEPENDENT_AMBULATORY_CARE_PROVIDER_SITE_OTHER): Payer: Medicare Other

## 2022-06-09 ENCOUNTER — Ambulatory Visit (INDEPENDENT_AMBULATORY_CARE_PROVIDER_SITE_OTHER): Payer: Medicare Other | Admitting: Vascular Surgery

## 2022-06-09 ENCOUNTER — Encounter (INDEPENDENT_AMBULATORY_CARE_PROVIDER_SITE_OTHER): Payer: Self-pay | Admitting: Vascular Surgery

## 2022-06-09 VITALS — BP 120/48 | HR 45 | Resp 12 | Ht 62.0 in | Wt 221.0 lb

## 2022-06-09 DIAGNOSIS — Z9889 Other specified postprocedural states: Secondary | ICD-10-CM | POA: Diagnosis not present

## 2022-06-09 DIAGNOSIS — E785 Hyperlipidemia, unspecified: Secondary | ICD-10-CM | POA: Diagnosis not present

## 2022-06-09 DIAGNOSIS — I739 Peripheral vascular disease, unspecified: Secondary | ICD-10-CM

## 2022-06-09 DIAGNOSIS — I70202 Unspecified atherosclerosis of native arteries of extremities, left leg: Secondary | ICD-10-CM

## 2022-06-09 DIAGNOSIS — I5A Non-ischemic myocardial injury (non-traumatic): Secondary | ICD-10-CM | POA: Diagnosis not present

## 2022-06-09 NOTE — Progress Notes (Signed)
MRN : [id number]  Rebecca Gilmore is a 82 y.o. (09-27-1940) female who presents with chief complaint of  Chief Complaint  Patient presents with   Follow-up    ultrasound  .  History of Present Illness: Patient returns today in follow up of left lower extremity arterial insufficiency.  She is about 29-month status post left iliofemoral endarterectomy and thrombectomy.  She still has some trouble with numbness on the inside of the left leg and difficulty walking which is likely a combination of neuropathic issues and significant hip degenerative arthritis.  No new ulceration or infection.  Her noninvasive studies today demonstrate normal ABIs of 1.27 on the right 1.08 on the left digit pressures of 103 on the right and 99 on the left.  Current Outpatient Medications  Medication Sig Dispense Refill   acetaminophen (TYLENOL) 325 MG tablet Take 2 tablets (650 mg total) by mouth every 6 (six) hours as needed for mild pain or fever.     amitriptyline (ELAVIL) 25 MG tablet Take 25 mg by mouth at bedtime.     cholecalciferol (VITAMIN D3) 25 MCG (1000 UT) tablet Take 1,000 Units by mouth daily.     cyanocobalamin (VITAMIN B12) 1000 MCG tablet Take 1,000 mcg by mouth.     Ferrous Sulfate (IRON) 325 (65 Fe) MG TABS Take 1 tablet by mouth daily.     loratadine (CLARITIN) 10 MG tablet Take 10 mg by mouth daily as needed.     pantoprazole (PROTONIX) 40 MG tablet Take 40 mg by mouth every morning.     potassium chloride SA (KLOR-CON M) 20 MEQ tablet Take 1 tablet (20 mEq total) by mouth daily. 60 tablet 3   pregabalin (LYRICA) 75 MG capsule Take 1 capsule (75 mg total) by mouth daily. 30 capsule 0   rosuvastatin (CRESTOR) 5 MG tablet Take 5 mg by mouth daily.     sertraline (ZOLOFT) 50 MG tablet Take 25 mg by mouth daily.  0   torsemide (DEMADEX) 100 MG tablet Take 0.5 tablets (50 mg total) by mouth daily. Increase to 1-1.5 tablet (100-150 mg total) by mouth ONCE daily (total daily dose 100-150 mg) as  needed for up to 3 days for increased leg swelling, shortness of breath, weight gain 5+ lbs over 1-2 days. Seek medical care if these symptoms are not improving with increased dose.     No current facility-administered medications for this visit.    Past Medical History:  Diagnosis Date   Anemia    Arrhythmia    atrial fibrillation   CHF (congestive heart failure)    Chronic kidney disease    Depression    HBP (high blood pressure)    History of bladder problems    Memory loss    Muscle pain    Osteoporosis    Reflux    Sinus congestion    Swelling    B/L FEET AND LEGS    Past Surgical History:  Procedure Laterality Date   ARTERY BIOPSY Bilateral 12/01/2016   Procedure: BIOPSY TEMPORAL ARTERY;  Surgeon: Conrad , MD;  Location: [facility];  Service: Vascular;  Laterality: Bilateral;   CATARACT SURGERY     COLONOSCOPY WITH PROPOFOL N/A 12/13/2021   Procedure: COLONOSCOPY WITH PROPOFOL;  Surgeon: Jonathon Bellows, MD;  Location: [facility] ENDOSCOPY;  Service: Gastroenterology;  Laterality: N/A;   ESOPHAGOGASTRODUODENOSCOPY (EGD) WITH PROPOFOL N/A 12/12/2021   Procedure: ESOPHAGOGASTRODUODENOSCOPY (EGD) WITH PROPOFOL;  Surgeon: [name], DO;  Location: [place];  Service: Gastroenterology;  Laterality: N/A;   EYELID SURGERY Bilateral    GIVENS CAPSULE STUDY N/A 12/15/2021   Procedure: GIVENS CAPSULE STUDY;  Surgeon: [name], DO;  Location: [facility] ENDOSCOPY;  Service: Gastroenterology;  Laterality: N/A;   GIVENS CAPSULE STUDY N/A 01/28/2022   Procedure: GIVENS CAPSULE STUDY;  Surgeon: [name], MD;  Location: [facility] ENDOSCOPY;  Service: Gastroenterology;  Laterality: N/A;   GROWTH ON FACE     KNEE SURGERY Right    THROMBECTOMY FEMORAL ARTERY Left 11/16/2021   Procedure: THROMBECTOMY FEMORAL ARTERY;  Surgeon: Conrad Seneca, MD;  Location: [facility]S;  Service: Vascular;  Laterality: Left;     Social History   Tobacco Use   Smoking status: Former     Types: Cigarettes    Quit date: 01/12/1993    Years since quitting: 29.4   Smokeless tobacco: Former    Types: Snuff, Chew   Tobacco comments:    DATE QUIT UNKNOWN  Vaping Use   Vaping Use: Never used  Substance Use Topics   Alcohol use: No   Drug use: No       Family History  Problem Relation Age of Onset   Heart disease Mother    AAA (abdominal aortic aneurysm) Mother    Heart disease Father      Allergies  Allergen Reactions   Morphine Hives and Itching    Other Reaction(s): Confusion   Morphine And Related Itching   Kenalog [Triamcinolone Acetonide] Other (See Comments)    FLUSH IN FACE   Penicillins Other (See Comments)    Has patient had a PCN reaction causing immediate rash, facial/tongue/throat swelling, SOB or lightheadedness with hypotension: Unknown Has patient had a PCN reaction causing severe rash involving mucus membranes or skin necrosis: Unknown Has patient had a PCN reaction that required hospitalization:No Has patient had a PCN reaction occurring within the last 10 years: No If all of the above answers are "NO", then may proceed with Cephalosporin use.      REVIEW OF SYSTEMS (Negative unless checked)  Constitutional: [] Weight loss  [] Fever  [] Chills Cardiac: [] Chest pain   [] Chest pressure   [x] Palpitations   [] Shortness of breath when laying flat   [] Shortness of breath at rest   [] Shortness of breath with exertion. Vascular:  [] Pain in legs with walking   [] Pain in legs at rest   [] Pain in legs when laying flat   [] Claudication   [] Pain in feet when walking  [] Pain in feet at rest  [] Pain in feet when laying flat   [] History of DVT   [] Phlebitis   [] Swelling in legs   [] Varicose veins   [] Non-healing ulcers Pulmonary:   [] Uses home oxygen   [] Productive cough   [] Hemoptysis   [] Wheeze  [] COPD   [] Asthma Neurologic:  [] Dizziness  [] Blackouts   [] Seizures   [] History of stroke   [] History of TIA  [] Aphasia   [] Temporary blindness   [] Dysphagia    [] Weakness or numbness in arms   [x] Weakness or numbness in legs Musculoskeletal:  [x] Arthritis   [] Joint swelling   [x] Joint pain   [] Low back pain Hematologic:  [] Easy bruising  [] Easy bleeding   [] Hypercoagulable state   [] Anemic   Gastrointestinal:  [] Blood in stool   [] Vomiting blood  [x] Gastroesophageal reflux/heartburn   [] Abdominal pain Genitourinary:  [] Chronic kidney disease   [] Difficult urination  [] Frequent urination  [] Burning with urination   [] Hematuria Skin:  [] Rashes   [] Ulcers   [] Wounds Psychological:  [] History  of anxiety   [x]  History of major depression.  Physical Examination  BP (!) 120/48 (BP Location: Right Arm)   Pulse (!) 45   Resp 12   Ht 5\' 2"  (1.575 m)   Wt 221 lb (100.2 kg)   BMI 40.42 kg/m  Gen:  WD/WN, NAD Head: [place]/AT, No temporalis wasting. Ear/Nose/Throat: Hearing grossly intact, nares w/o erythema or drainage Eyes: Conjunctiva clear. Sclera non-icteric Neck: Supple.  Trachea midline Pulmonary:  Good air movement, no use of accessory muscles.  Cardiac: irregular Vascular:  Vessel Right Left  Radial Palpable Palpable                          PT Palpable Palpable  DP Palpable Palpable   Gastrointestinal: soft, non-tender/non-distended. No guarding/reflex.  Musculoskeletal: M/S 5/5 throughout.  No deformity or atrophy. Trace LE edema. Neurologic: Sensation grossly intact in extremities.  Symmetrical.  Speech is fluent.  Psychiatric: Judgment intact, Mood & affect appropriate for pt's clinical situation. Dermatologic: No rashes or ulcers noted.  No cellulitis or open wounds.      Labs Recent Results (from the past 2160 hour(s))  Retic Panel     Status: Abnormal   Collection Time: 03/18/22 11:47 AM  Result Value Ref Range   Retic Ct Pct 4.4 (H) 0.4 - 3.1 %   RBC. 2.73 (L) 3.87 - 5.11 MIL/uL   Retic Count, Absolute 119.3 19.0 - 186.0 K/uL   Immature Retic Fract 16.4 (H) 2.3 - 15.9 %   Reticulocyte Hemoglobin 28.4 >27.9 pg     Comment:           If this patient has chronic kidney disease and does not have a hemoglobinopathy a RET-He < 29 pg meets criteria for iron deficiency per the 2016 NICE guidelines.    Refer to specific guidelines to determine the appropriate thresholds for treating CKD- associated iron deficiency. TSAT and ferritin should be used in patients with hemoglobinopathies (e.g. thalassemia). Performed at [facility], [address] [place], [place] [address]   Sample to Blood Bank     Status: None   Collection Time: 03/18/22 11:47 AM  Result Value Ref Range   Blood Bank Specimen SAMPLE AVAILABLE FOR TESTING    Sample Expiration      03/21/2022,2359 Performed at [facility], [address] [place], [place] [address]   Iron and TIBC     Status: Abnormal   Collection Time: 03/18/22 11:47 AM  Result Value Ref Range   Iron 220 (H) 28 - 170 ug/dL   TIBC 356 250 - 450 ug/dL   Saturation Ratios 62 (H) 10.4 - 31.8 %   UIBC 136 ug/dL    Comment: Performed at [facility], [address] [place], [place] [address]  Ferritin     Status: None   Collection Time: 03/18/22 11:47 AM  Result Value Ref Range   Ferritin 45 11 - 307 ng/mL    Comment: Performed at [facility], [address] [place], [place] [address]  CBC with Differential/Platelet     Status: Abnormal   Collection Time: 03/18/22 11:47 AM  Result Value Ref Range   WBC 5.0 4.0 - 10.5 K/uL   RBC 2.79 (L) 3.87 - 5.11 MIL/uL   Hemoglobin 8.3 (L) 12.0 - 15.0 g/dL   HCT 26.4 (L) 36.0 - 46.0 %   MCV 94.6 80.0 - 100.0 fL   MCH 29.7 26.0 - 34.0 pg  MCHC 31.4 30.0 - 36.0 g/dL   RDW 14.7 11.5 - 15.5 %   Platelets 230 150 - 400 K/uL   nRBC 0.0 0.0 - 0.2 %   Neutrophils Relative % 64 %   Neutro Abs 3.2 1.7 - 7.7 K/uL   Lymphocytes Relative 25 %   Lymphs Abs 1.2 0.7 - 4.0 K/uL   Monocytes Relative 8 %   Monocytes Absolute 0.4 0.1 - 1.0 K/uL   Eosinophils Relative 3 %   Eosinophils Absolute  0.1 0.0 - 0.5 K/uL   Basophils Relative 0 %   Basophils Absolute 0.0 0.0 - 0.1 K/uL   Immature Granulocytes 0 %   Abs Immature Granulocytes 0.02 0.00 - 0.07 K/uL    Comment: Performed at [facility], [address] [place], [place] [address]  Ferritin     Status: None   Collection Time: 04/21/22 11:03 AM  Result Value Ref Range   Ferritin 98 11 - 307 ng/mL    Comment: Performed at [facility], [address] [place], [place] [address]  Iron and TIBC     Status: None   Collection Time: 04/21/22 11:03 AM  Result Value Ref Range   Iron 65 28 - 170 ug/dL   TIBC 351 250 - 450 ug/dL   Saturation Ratios 19 10.4 - 31.8 %   UIBC 286 ug/dL    Comment: Performed at [facility], [address] [place], [place] [address]  CBC with Differential/Platelet     Status: Abnormal   Collection Time: 04/21/22 11:03 AM  Result Value Ref Range   WBC 4.8 4.0 - 10.5 K/uL   RBC 3.75 (L) 3.87 - 5.11 MIL/uL   Hemoglobin 10.7 (L) 12.0 - 15.0 g/dL   HCT 34.3 (L) 36.0 - 46.0 %   MCV 91.5 80.0 - 100.0 fL   MCH 28.5 26.0 - 34.0 pg   MCHC 31.2 30.0 - 36.0 g/dL   RDW 14.6 11.5 - 15.5 %   Platelets 158 150 - 400 K/uL   nRBC 0.0 0.0 - 0.2 %   Neutrophils Relative % 63 %   Neutro Abs 3.0 1.7 - 7.7 K/uL   Lymphocytes Relative 26 %   Lymphs Abs 1.3 0.7 - 4.0 K/uL   Monocytes Relative 9 %   Monocytes Absolute 0.4 0.1 - 1.0 K/uL   Eosinophils Relative 2 %   Eosinophils Absolute 0.1 0.0 - 0.5 K/uL   Basophils Relative 0 %   Basophils Absolute 0.0 0.0 - 0.1 K/uL   Immature Granulocytes 0 %   Abs Immature Granulocytes 0.01 0.00 - 0.07 K/uL    Comment: Performed at [facility], [address] [place], [place] [address]    Radiology No results found.  Assessment/Plan  Femoral artery occlusion, left (HCC) Her noninvasive studies today demonstrate normal ABIs of 1.27 on the right 1.08 on the left digit pressures of 103 on the right and 99 on the left.  Doing well from a  perfusion standpoint after successful revascularization of the left lower extremity.  Continue current medical regimen.  Recheck in 6 months with noninvasive studies.  Myocardial injury blood pressure control important in reducing the progression of atherosclerotic disease. On appropriate oral medications.   HLD (hyperlipidemia) lipid control important in reducing the progression of atherosclerotic disease. Continue statin therapy    Leotis Pain, MD  06/09/2022 12:29 PM    This note was created with Dragon medical transcription system.  Any errors from dictation are purely  unintentional

## 2022-06-09 NOTE — Assessment & Plan Note (Signed)
Her noninvasive studies today demonstrate normal ABIs of 1.27 on the right 1.08 on the left digit pressures of 103 on the right and 99 on the left.  Doing well from a perfusion standpoint after successful revascularization of the left lower extremity.  Continue current medical regimen.  Recheck in 6 months with noninvasive studies.

## 2022-06-09 NOTE — Assessment & Plan Note (Signed)
blood pressure control important in reducing the progression of atherosclerotic disease. On appropriate oral medications.  

## 2022-06-09 NOTE — Assessment & Plan Note (Signed)
lipid control important in reducing the progression of atherosclerotic disease. Continue statin therapy  

## 2022-06-10 LAB — VAS US ABI WITH/WO TBI
Left ABI: 1.08
Right ABI: 1.27

## 2022-06-11 DIAGNOSIS — Z86011 Personal history of benign neoplasm of the brain: Secondary | ICD-10-CM | POA: Diagnosis not present

## 2022-06-11 DIAGNOSIS — E559 Vitamin D deficiency, unspecified: Secondary | ICD-10-CM | POA: Diagnosis not present

## 2022-06-11 DIAGNOSIS — M316 Other giant cell arteritis: Secondary | ICD-10-CM | POA: Diagnosis not present

## 2022-06-11 DIAGNOSIS — G8929 Other chronic pain: Secondary | ICD-10-CM | POA: Diagnosis not present

## 2022-06-11 DIAGNOSIS — Z8673 Personal history of transient ischemic attack (TIA), and cerebral infarction without residual deficits: Secondary | ICD-10-CM | POA: Diagnosis not present

## 2022-06-11 DIAGNOSIS — M1612 Unilateral primary osteoarthritis, left hip: Secondary | ICD-10-CM | POA: Diagnosis not present

## 2022-06-11 DIAGNOSIS — K219 Gastro-esophageal reflux disease without esophagitis: Secondary | ICD-10-CM | POA: Diagnosis not present

## 2022-06-11 DIAGNOSIS — M48062 Spinal stenosis, lumbar region with neurogenic claudication: Secondary | ICD-10-CM | POA: Diagnosis not present

## 2022-06-11 DIAGNOSIS — G2581 Restless legs syndrome: Secondary | ICD-10-CM | POA: Diagnosis not present

## 2022-06-11 DIAGNOSIS — I129 Hypertensive chronic kidney disease with stage 1 through stage 4 chronic kidney disease, or unspecified chronic kidney disease: Secondary | ICD-10-CM | POA: Diagnosis not present

## 2022-06-11 DIAGNOSIS — N183 Chronic kidney disease, stage 3 unspecified: Secondary | ICD-10-CM | POA: Diagnosis not present

## 2022-06-11 DIAGNOSIS — I252 Old myocardial infarction: Secondary | ICD-10-CM | POA: Diagnosis not present

## 2022-06-11 DIAGNOSIS — E11649 Type 2 diabetes mellitus with hypoglycemia without coma: Secondary | ICD-10-CM | POA: Diagnosis not present

## 2022-06-11 DIAGNOSIS — Z556 Problems related to health literacy: Secondary | ICD-10-CM | POA: Diagnosis not present

## 2022-06-11 DIAGNOSIS — Z7902 Long term (current) use of antithrombotics/antiplatelets: Secondary | ICD-10-CM | POA: Diagnosis not present

## 2022-06-11 DIAGNOSIS — I4891 Unspecified atrial fibrillation: Secondary | ICD-10-CM | POA: Diagnosis not present

## 2022-06-11 DIAGNOSIS — Z5982 Transportation insecurity: Secondary | ICD-10-CM | POA: Diagnosis not present

## 2022-06-11 DIAGNOSIS — G43909 Migraine, unspecified, not intractable, without status migrainosus: Secondary | ICD-10-CM | POA: Diagnosis not present

## 2022-06-11 DIAGNOSIS — Z87891 Personal history of nicotine dependence: Secondary | ICD-10-CM | POA: Diagnosis not present

## 2022-06-11 DIAGNOSIS — M80061D Age-related osteoporosis with current pathological fracture, right lower leg, subsequent encounter for fracture with routine healing: Secondary | ICD-10-CM | POA: Diagnosis not present

## 2022-06-11 DIAGNOSIS — I872 Venous insufficiency (chronic) (peripheral): Secondary | ICD-10-CM | POA: Diagnosis not present

## 2022-06-11 DIAGNOSIS — M5416 Radiculopathy, lumbar region: Secondary | ICD-10-CM | POA: Diagnosis not present

## 2022-06-11 DIAGNOSIS — E1122 Type 2 diabetes mellitus with diabetic chronic kidney disease: Secondary | ICD-10-CM | POA: Diagnosis not present

## 2022-06-15 DIAGNOSIS — M5416 Radiculopathy, lumbar region: Secondary | ICD-10-CM | POA: Diagnosis not present

## 2022-06-15 DIAGNOSIS — E559 Vitamin D deficiency, unspecified: Secondary | ICD-10-CM | POA: Diagnosis not present

## 2022-06-15 DIAGNOSIS — I872 Venous insufficiency (chronic) (peripheral): Secondary | ICD-10-CM | POA: Diagnosis not present

## 2022-06-15 DIAGNOSIS — I4891 Unspecified atrial fibrillation: Secondary | ICD-10-CM | POA: Diagnosis not present

## 2022-06-15 DIAGNOSIS — Z87891 Personal history of nicotine dependence: Secondary | ICD-10-CM | POA: Diagnosis not present

## 2022-06-15 DIAGNOSIS — I252 Old myocardial infarction: Secondary | ICD-10-CM | POA: Diagnosis not present

## 2022-06-15 DIAGNOSIS — G8929 Other chronic pain: Secondary | ICD-10-CM | POA: Diagnosis not present

## 2022-06-15 DIAGNOSIS — M80061D Age-related osteoporosis with current pathological fracture, right lower leg, subsequent encounter for fracture with routine healing: Secondary | ICD-10-CM | POA: Diagnosis not present

## 2022-06-15 DIAGNOSIS — Z86011 Personal history of benign neoplasm of the brain: Secondary | ICD-10-CM | POA: Diagnosis not present

## 2022-06-15 DIAGNOSIS — E1122 Type 2 diabetes mellitus with diabetic chronic kidney disease: Secondary | ICD-10-CM | POA: Diagnosis not present

## 2022-06-15 DIAGNOSIS — I129 Hypertensive chronic kidney disease with stage 1 through stage 4 chronic kidney disease, or unspecified chronic kidney disease: Secondary | ICD-10-CM | POA: Diagnosis not present

## 2022-06-15 DIAGNOSIS — G2581 Restless legs syndrome: Secondary | ICD-10-CM | POA: Diagnosis not present

## 2022-06-15 DIAGNOSIS — Z7902 Long term (current) use of antithrombotics/antiplatelets: Secondary | ICD-10-CM | POA: Diagnosis not present

## 2022-06-15 DIAGNOSIS — K219 Gastro-esophageal reflux disease without esophagitis: Secondary | ICD-10-CM | POA: Diagnosis not present

## 2022-06-15 DIAGNOSIS — G43909 Migraine, unspecified, not intractable, without status migrainosus: Secondary | ICD-10-CM | POA: Diagnosis not present

## 2022-06-15 DIAGNOSIS — E11649 Type 2 diabetes mellitus with hypoglycemia without coma: Secondary | ICD-10-CM | POA: Diagnosis not present

## 2022-06-15 DIAGNOSIS — Z556 Problems related to health literacy: Secondary | ICD-10-CM | POA: Diagnosis not present

## 2022-06-15 DIAGNOSIS — M316 Other giant cell arteritis: Secondary | ICD-10-CM | POA: Diagnosis not present

## 2022-06-15 DIAGNOSIS — Z8673 Personal history of transient ischemic attack (TIA), and cerebral infarction without residual deficits: Secondary | ICD-10-CM | POA: Diagnosis not present

## 2022-06-15 DIAGNOSIS — Z5982 Transportation insecurity: Secondary | ICD-10-CM | POA: Diagnosis not present

## 2022-06-15 DIAGNOSIS — M48062 Spinal stenosis, lumbar region with neurogenic claudication: Secondary | ICD-10-CM | POA: Diagnosis not present

## 2022-06-15 DIAGNOSIS — M1612 Unilateral primary osteoarthritis, left hip: Secondary | ICD-10-CM | POA: Diagnosis not present

## 2022-06-15 DIAGNOSIS — N183 Chronic kidney disease, stage 3 unspecified: Secondary | ICD-10-CM | POA: Diagnosis not present

## 2022-06-16 DIAGNOSIS — I214 Non-ST elevation (NSTEMI) myocardial infarction: Secondary | ICD-10-CM | POA: Diagnosis not present

## 2022-06-16 DIAGNOSIS — M48062 Spinal stenosis, lumbar region with neurogenic claudication: Secondary | ICD-10-CM | POA: Diagnosis not present

## 2022-06-16 DIAGNOSIS — M5442 Lumbago with sciatica, left side: Secondary | ICD-10-CM | POA: Diagnosis not present

## 2022-06-16 DIAGNOSIS — M25552 Pain in left hip: Secondary | ICD-10-CM | POA: Diagnosis not present

## 2022-06-16 DIAGNOSIS — G8929 Other chronic pain: Secondary | ICD-10-CM | POA: Diagnosis not present

## 2022-06-22 ENCOUNTER — Telehealth: Payer: Self-pay | Admitting: Oncology

## 2022-06-22 NOTE — Telephone Encounter (Signed)
Patients daughter called to see if we can get her in sooner. She is having same symptoms as before -chewing ice/tired. Her appointment is for 5/28. She said she will see APP if allowed.  Please advise on scheduling--   Please call Daughter after [phone]  [phone] Debbora Presto

## 2022-06-23 DIAGNOSIS — G43909 Migraine, unspecified, not intractable, without status migrainosus: Secondary | ICD-10-CM | POA: Diagnosis not present

## 2022-06-23 DIAGNOSIS — Z86011 Personal history of benign neoplasm of the brain: Secondary | ICD-10-CM | POA: Diagnosis not present

## 2022-06-23 DIAGNOSIS — N183 Chronic kidney disease, stage 3 unspecified: Secondary | ICD-10-CM | POA: Diagnosis not present

## 2022-06-23 DIAGNOSIS — I4891 Unspecified atrial fibrillation: Secondary | ICD-10-CM | POA: Diagnosis not present

## 2022-06-23 DIAGNOSIS — M80061D Age-related osteoporosis with current pathological fracture, right lower leg, subsequent encounter for fracture with routine healing: Secondary | ICD-10-CM | POA: Diagnosis not present

## 2022-06-23 DIAGNOSIS — Z8673 Personal history of transient ischemic attack (TIA), and cerebral infarction without residual deficits: Secondary | ICD-10-CM | POA: Diagnosis not present

## 2022-06-23 DIAGNOSIS — G8929 Other chronic pain: Secondary | ICD-10-CM | POA: Diagnosis not present

## 2022-06-23 DIAGNOSIS — Z87891 Personal history of nicotine dependence: Secondary | ICD-10-CM | POA: Diagnosis not present

## 2022-06-23 DIAGNOSIS — M48062 Spinal stenosis, lumbar region with neurogenic claudication: Secondary | ICD-10-CM | POA: Diagnosis not present

## 2022-06-23 DIAGNOSIS — M5416 Radiculopathy, lumbar region: Secondary | ICD-10-CM | POA: Diagnosis not present

## 2022-06-23 DIAGNOSIS — E559 Vitamin D deficiency, unspecified: Secondary | ICD-10-CM | POA: Diagnosis not present

## 2022-06-23 DIAGNOSIS — M1612 Unilateral primary osteoarthritis, left hip: Secondary | ICD-10-CM | POA: Diagnosis not present

## 2022-06-23 DIAGNOSIS — I129 Hypertensive chronic kidney disease with stage 1 through stage 4 chronic kidney disease, or unspecified chronic kidney disease: Secondary | ICD-10-CM | POA: Diagnosis not present

## 2022-06-23 DIAGNOSIS — Z7902 Long term (current) use of antithrombotics/antiplatelets: Secondary | ICD-10-CM | POA: Diagnosis not present

## 2022-06-23 DIAGNOSIS — Z556 Problems related to health literacy: Secondary | ICD-10-CM | POA: Diagnosis not present

## 2022-06-23 DIAGNOSIS — I252 Old myocardial infarction: Secondary | ICD-10-CM | POA: Diagnosis not present

## 2022-06-23 DIAGNOSIS — M316 Other giant cell arteritis: Secondary | ICD-10-CM | POA: Diagnosis not present

## 2022-06-23 DIAGNOSIS — E11649 Type 2 diabetes mellitus with hypoglycemia without coma: Secondary | ICD-10-CM | POA: Diagnosis not present

## 2022-06-23 DIAGNOSIS — K219 Gastro-esophageal reflux disease without esophagitis: Secondary | ICD-10-CM | POA: Diagnosis not present

## 2022-06-23 DIAGNOSIS — G2581 Restless legs syndrome: Secondary | ICD-10-CM | POA: Diagnosis not present

## 2022-06-23 DIAGNOSIS — Z5982 Transportation insecurity: Secondary | ICD-10-CM | POA: Diagnosis not present

## 2022-06-23 DIAGNOSIS — E1122 Type 2 diabetes mellitus with diabetic chronic kidney disease: Secondary | ICD-10-CM | POA: Diagnosis not present

## 2022-06-23 DIAGNOSIS — I872 Venous insufficiency (chronic) (peripheral): Secondary | ICD-10-CM | POA: Diagnosis not present

## 2022-06-29 ENCOUNTER — Inpatient Hospital Stay: Payer: Medicare Other | Attending: Oncology

## 2022-06-29 DIAGNOSIS — Z87891 Personal history of nicotine dependence: Secondary | ICD-10-CM | POA: Insufficient documentation

## 2022-06-29 DIAGNOSIS — I4891 Unspecified atrial fibrillation: Secondary | ICD-10-CM | POA: Diagnosis not present

## 2022-06-29 DIAGNOSIS — M549 Dorsalgia, unspecified: Secondary | ICD-10-CM | POA: Insufficient documentation

## 2022-06-29 DIAGNOSIS — Z7952 Long term (current) use of systemic steroids: Secondary | ICD-10-CM | POA: Insufficient documentation

## 2022-06-29 DIAGNOSIS — Z79899 Other long term (current) drug therapy: Secondary | ICD-10-CM | POA: Diagnosis not present

## 2022-06-29 DIAGNOSIS — M2550 Pain in unspecified joint: Secondary | ICD-10-CM | POA: Insufficient documentation

## 2022-06-29 DIAGNOSIS — I509 Heart failure, unspecified: Secondary | ICD-10-CM | POA: Insufficient documentation

## 2022-06-29 DIAGNOSIS — M542 Cervicalgia: Secondary | ICD-10-CM | POA: Diagnosis not present

## 2022-06-29 DIAGNOSIS — D631 Anemia in chronic kidney disease: Secondary | ICD-10-CM | POA: Insufficient documentation

## 2022-06-29 DIAGNOSIS — Z7902 Long term (current) use of antithrombotics/antiplatelets: Secondary | ICD-10-CM | POA: Diagnosis not present

## 2022-06-29 DIAGNOSIS — N184 Chronic kidney disease, stage 4 (severe): Secondary | ICD-10-CM | POA: Insufficient documentation

## 2022-06-29 DIAGNOSIS — Z885 Allergy status to narcotic agent status: Secondary | ICD-10-CM | POA: Insufficient documentation

## 2022-06-29 DIAGNOSIS — R5383 Other fatigue: Secondary | ICD-10-CM | POA: Diagnosis not present

## 2022-06-29 DIAGNOSIS — Z8249 Family history of ischemic heart disease and other diseases of the circulatory system: Secondary | ICD-10-CM | POA: Insufficient documentation

## 2022-06-29 LAB — CBC WITH DIFFERENTIAL (CANCER CENTER ONLY)
Abs Immature Granulocytes: 0.02 10*3/uL (ref 0.00–0.07)
Basophils Absolute: 0 10*3/uL (ref 0.0–0.1)
Basophils Relative: 0 %
Eosinophils Absolute: 0.1 10*3/uL (ref 0.0–0.5)
Eosinophils Relative: 2 %
HCT: 24 % — ABNORMAL LOW (ref 36.0–46.0)
Hemoglobin: 7.3 g/dL — ABNORMAL LOW (ref 12.0–15.0)
Immature Granulocytes: 0 %
Lymphocytes Relative: 20 %
Lymphs Abs: 1.1 10*3/uL (ref 0.7–4.0)
MCH: 29.3 pg (ref 26.0–34.0)
MCHC: 30.4 g/dL (ref 30.0–36.0)
MCV: 96.4 fL (ref 80.0–100.0)
Monocytes Absolute: 0.5 10*3/uL (ref 0.1–1.0)
Monocytes Relative: 9 %
Neutro Abs: 3.8 10*3/uL (ref 1.7–7.7)
Neutrophils Relative %: 69 %
Platelet Count: 234 10*3/uL (ref 150–400)
RBC: 2.49 MIL/uL — ABNORMAL LOW (ref 3.87–5.11)
RDW: 16.3 % — ABNORMAL HIGH (ref 11.5–15.5)
WBC Count: 5.5 10*3/uL (ref 4.0–10.5)
nRBC: 0 % (ref 0.0–0.2)

## 2022-06-29 LAB — IRON AND TIBC
Iron: 226 ug/dL — ABNORMAL HIGH (ref 28–170)
Saturation Ratios: 58 % — ABNORMAL HIGH (ref 10.4–31.8)
TIBC: 388 ug/dL (ref 250–450)
UIBC: 162 ug/dL

## 2022-06-29 LAB — FERRITIN: Ferritin: 19 ng/mL (ref 11–307)

## 2022-06-29 LAB — RETIC PANEL
Immature Retic Fract: 22.9 % — ABNORMAL HIGH (ref 2.3–15.9)
RBC.: 2.49 MIL/uL — ABNORMAL LOW (ref 3.87–5.11)
Retic Count, Absolute: 163.8 10*3/uL (ref 19.0–186.0)
Retic Ct Pct: 6.6 % — ABNORMAL HIGH (ref 0.4–3.1)
Reticulocyte Hemoglobin: 27.2 pg — ABNORMAL LOW (ref >27.9)

## 2022-06-30 DIAGNOSIS — Z86011 Personal history of benign neoplasm of the brain: Secondary | ICD-10-CM | POA: Diagnosis not present

## 2022-06-30 DIAGNOSIS — M5416 Radiculopathy, lumbar region: Secondary | ICD-10-CM | POA: Diagnosis not present

## 2022-06-30 DIAGNOSIS — E559 Vitamin D deficiency, unspecified: Secondary | ICD-10-CM | POA: Diagnosis not present

## 2022-06-30 DIAGNOSIS — E1122 Type 2 diabetes mellitus with diabetic chronic kidney disease: Secondary | ICD-10-CM | POA: Diagnosis not present

## 2022-06-30 DIAGNOSIS — G2581 Restless legs syndrome: Secondary | ICD-10-CM | POA: Diagnosis not present

## 2022-06-30 DIAGNOSIS — Z8673 Personal history of transient ischemic attack (TIA), and cerebral infarction without residual deficits: Secondary | ICD-10-CM | POA: Diagnosis not present

## 2022-06-30 DIAGNOSIS — N183 Chronic kidney disease, stage 3 unspecified: Secondary | ICD-10-CM | POA: Diagnosis not present

## 2022-06-30 DIAGNOSIS — G43909 Migraine, unspecified, not intractable, without status migrainosus: Secondary | ICD-10-CM | POA: Diagnosis not present

## 2022-06-30 DIAGNOSIS — I252 Old myocardial infarction: Secondary | ICD-10-CM | POA: Diagnosis not present

## 2022-06-30 DIAGNOSIS — Z7902 Long term (current) use of antithrombotics/antiplatelets: Secondary | ICD-10-CM | POA: Diagnosis not present

## 2022-06-30 DIAGNOSIS — I129 Hypertensive chronic kidney disease with stage 1 through stage 4 chronic kidney disease, or unspecified chronic kidney disease: Secondary | ICD-10-CM | POA: Diagnosis not present

## 2022-06-30 DIAGNOSIS — I872 Venous insufficiency (chronic) (peripheral): Secondary | ICD-10-CM | POA: Diagnosis not present

## 2022-06-30 DIAGNOSIS — M316 Other giant cell arteritis: Secondary | ICD-10-CM | POA: Diagnosis not present

## 2022-06-30 DIAGNOSIS — M80061D Age-related osteoporosis with current pathological fracture, right lower leg, subsequent encounter for fracture with routine healing: Secondary | ICD-10-CM | POA: Diagnosis not present

## 2022-06-30 DIAGNOSIS — G8929 Other chronic pain: Secondary | ICD-10-CM | POA: Diagnosis not present

## 2022-06-30 DIAGNOSIS — I4891 Unspecified atrial fibrillation: Secondary | ICD-10-CM | POA: Diagnosis not present

## 2022-06-30 DIAGNOSIS — Z87891 Personal history of nicotine dependence: Secondary | ICD-10-CM | POA: Diagnosis not present

## 2022-06-30 DIAGNOSIS — M1612 Unilateral primary osteoarthritis, left hip: Secondary | ICD-10-CM | POA: Diagnosis not present

## 2022-06-30 DIAGNOSIS — M48062 Spinal stenosis, lumbar region with neurogenic claudication: Secondary | ICD-10-CM | POA: Diagnosis not present

## 2022-06-30 DIAGNOSIS — K219 Gastro-esophageal reflux disease without esophagitis: Secondary | ICD-10-CM | POA: Diagnosis not present

## 2022-06-30 DIAGNOSIS — E11649 Type 2 diabetes mellitus with hypoglycemia without coma: Secondary | ICD-10-CM | POA: Diagnosis not present

## 2022-06-30 DIAGNOSIS — Z5982 Transportation insecurity: Secondary | ICD-10-CM | POA: Diagnosis not present

## 2022-06-30 DIAGNOSIS — Z556 Problems related to health literacy: Secondary | ICD-10-CM | POA: Diagnosis not present

## 2022-07-01 ENCOUNTER — Inpatient Hospital Stay (HOSPITAL_BASED_OUTPATIENT_CLINIC_OR_DEPARTMENT_OTHER): Payer: Medicare Other | Admitting: Oncology

## 2022-07-01 ENCOUNTER — Inpatient Hospital Stay: Payer: Medicare Other

## 2022-07-01 ENCOUNTER — Encounter: Payer: Self-pay | Admitting: Oncology

## 2022-07-01 VITALS — BP 115/39 | HR 64

## 2022-07-01 VITALS — BP 99/43 | HR 65 | Temp 98.0°F | Resp 17 | Wt 227.0 lb

## 2022-07-01 DIAGNOSIS — Z7902 Long term (current) use of antithrombotics/antiplatelets: Secondary | ICD-10-CM | POA: Diagnosis not present

## 2022-07-01 DIAGNOSIS — N184 Chronic kidney disease, stage 4 (severe): Secondary | ICD-10-CM

## 2022-07-01 DIAGNOSIS — R5383 Other fatigue: Secondary | ICD-10-CM | POA: Diagnosis not present

## 2022-07-01 DIAGNOSIS — D631 Anemia in chronic kidney disease: Secondary | ICD-10-CM | POA: Diagnosis not present

## 2022-07-01 DIAGNOSIS — D62 Acute posthemorrhagic anemia: Secondary | ICD-10-CM

## 2022-07-01 DIAGNOSIS — Z7952 Long term (current) use of systemic steroids: Secondary | ICD-10-CM | POA: Diagnosis not present

## 2022-07-01 DIAGNOSIS — Z885 Allergy status to narcotic agent status: Secondary | ICD-10-CM | POA: Diagnosis not present

## 2022-07-01 DIAGNOSIS — M549 Dorsalgia, unspecified: Secondary | ICD-10-CM | POA: Diagnosis not present

## 2022-07-01 DIAGNOSIS — Z79899 Other long term (current) drug therapy: Secondary | ICD-10-CM | POA: Diagnosis not present

## 2022-07-01 DIAGNOSIS — M2550 Pain in unspecified joint: Secondary | ICD-10-CM | POA: Diagnosis not present

## 2022-07-01 DIAGNOSIS — Z8249 Family history of ischemic heart disease and other diseases of the circulatory system: Secondary | ICD-10-CM | POA: Diagnosis not present

## 2022-07-01 DIAGNOSIS — M542 Cervicalgia: Secondary | ICD-10-CM | POA: Diagnosis not present

## 2022-07-01 DIAGNOSIS — Z87891 Personal history of nicotine dependence: Secondary | ICD-10-CM | POA: Diagnosis not present

## 2022-07-01 DIAGNOSIS — I4891 Unspecified atrial fibrillation: Secondary | ICD-10-CM | POA: Diagnosis not present

## 2022-07-01 DIAGNOSIS — I509 Heart failure, unspecified: Secondary | ICD-10-CM | POA: Diagnosis not present

## 2022-07-01 MED ORDER — SODIUM CHLORIDE 0.9 % IV SOLN
200.0000 mg | Freq: Once | INTRAVENOUS | Status: AC
  Administered 2022-07-01: 200 mg via INTRAVENOUS
  Filled 2022-07-01: qty 200

## 2022-07-01 MED ORDER — SODIUM CHLORIDE 0.9 % IV SOLN
Freq: Once | INTRAVENOUS | Status: AC
  Filled 2022-07-01: qty 250

## 2022-07-01 NOTE — Progress Notes (Signed)
Hematology/Oncology Progress note [phone] [phone]      CHIEF COMPLAINTS/REASON FOR VISIT:  Anemia due to CKD  ASSESSMENT & PLAN:   Anemia of chronic kidney failure, stage 4 (severe) (HCC) Likely anemia secondary to chronic kidney disease. Previous Bone marrow biopsy results showed hypercellular marrow, no significant dysplastic features.  Cytogenetic normal. Labs are reviewed and discussed with patient. Lab Results  Component Value Date   IRON 226 (H) 06/29/2022   TIBC 388 06/29/2022   IRONPCTSAT 58 (H) 06/29/2022   FERRITIN 19 06/29/2022   HGB 7.3 (L) 06/29/2022   Hb has decreased, suspect ongoing GI blood loss.  Recommend IV Venofer weekly x 4  CKD (chronic kidney disease) stage 4, GFR 15-29 ml/min (HCC) Encourage oral hydration and avoid nephrotoxins.     Orders Placed This Encounter  Procedures   CBC with Differential (Cancer Center Only)    Standing Status:   Future    Standing Expiration Date:   07/01/2023   Retic Panel    Standing Status:   Future    Standing Expiration Date:   07/01/2023   CBC with Differential (Cancer Center Only)    Standing Status:   Future    Standing Expiration Date:   07/01/2023   Retic Panel    Standing Status:   Future    Standing Expiration Date:   07/01/2023   CBC with Differential (Cancer Center Only)    Standing Status:   Future    Standing Expiration Date:   07/01/2023   Iron and TIBC    Standing Status:   Future    Standing Expiration Date:   07/01/2023   Ferritin    Standing Status:   Future    Standing Expiration Date:   07/01/2023   Retic Panel    Standing Status:   Future    Standing Expiration Date:   07/01/2023   Follow-up in 3 months  All questions were answered. The patient knows to call the clinic with any problems, questions or concerns.  Rickard Patience, MD, PhD [facility] Hematology Oncology 07/01/2022    HISTORY OF PRESENTING ILLNESS:  Reviewed patient's recent labs that was done.   10/17/2018 labs revealed anemia with hemoglobin of 12.4 Free kappa light chain 5.35, lambda light chain 2.23, kappa lambda ratio 2.31. SPEP showed faint monoclonal component typed as IgG kappa.  Faint band suspicious for monoclonal components No aggravating or improving factors.  Associated signs and symptoms: Chronic fatigue.  No exacerbating or alleviating factors. . Patient has chronic kidney disease stage III follow-up with [facility] nephrology Dr. 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  Has been on chronic steroids.She reports her current prednisone regimen is 5 mg alternating with 2.5 mg. Chronic joint, neck, back pains.  hospitalized from 02/18/2022 - 02/20/2022 due to anemia.  Patient had extensive GI workup including EGD, colonoscopy, capsule study which were negative.  Patient was seen by Dr. Donneta Romberg during hospitalization.  Patient presented with a hemoglobin of 6.5, status post PRBC transfusion and 1 dose of Venofer 300 mg daily.   02/20/22 Bone marrow biopsy showed hypercellular marrow, no significant dysplastic.  Cytogenetic normal.   INTERVAL HISTORY Rebecca Gilmore is a 82 y.o. female who has above history reviewed by me today presents for follow up of anemia due to CKD She was last seen by me on 02/06/2020. She tolerates IV Venofer treatments. Hb has improved,however recently she felt more fatigue, craving for ice chips.  Denies hematochezia, hematuria, hematemesis,  epistaxis, black tarry stool or easy bruising.  BP has been low. Recently she resumed metoprolol for a period of time. She has been off metoprolol per cardiology recommendation since yesterday. Denies lightheaded.   Review of Systems  Constitutional:  Positive for fatigue. Negative for appetite change, chills and fever.  HENT:   Negative for hearing loss and voice change.   Eyes:  Negative for eye problems.  Respiratory:  Negative for chest tightness and cough.   Cardiovascular:   Negative for chest pain.  Gastrointestinal:  Negative for abdominal distention, abdominal pain and blood in stool.  Endocrine: Negative for hot flashes.  Genitourinary:  Negative for difficulty urinating and frequency.   Musculoskeletal:  Positive for arthralgias, back pain and neck pain.  Skin:  Negative for itching and rash.  Neurological:  Negative for extremity weakness.  Hematological:  Negative for adenopathy.  Psychiatric/Behavioral:  Negative for confusion.     MEDICAL HISTORY:  Past Medical History:  Diagnosis Date   Anemia    Arrhythmia    atrial fibrillation   CHF (congestive heart failure)    Chronic kidney disease    Depression    HBP (high blood pressure)    History of bladder problems    Memory loss    Muscle pain    Osteoporosis    Reflux    Sinus congestion    Swelling    B/L FEET AND LEGS    SURGICAL HISTORY: Past Surgical History:  Procedure Laterality Date   ARTERY BIOPSY Bilateral 12/01/2016   Procedure: BIOPSY TEMPORAL ARTERY;  Surgeon: Fransisco Hertz, MD;  Location: [facility] OR;  Service: Vascular;  Laterality: Bilateral;   CATARACT SURGERY     COLONOSCOPY WITH PROPOFOL N/A 12/13/2021   Procedure: COLONOSCOPY WITH PROPOFOL;  Surgeon: Wyline Mood, MD;  Location: [facility] ENDOSCOPY;  Service: Gastroenterology;  Laterality: N/A;   ESOPHAGOGASTRODUODENOSCOPY (EGD) WITH PROPOFOL N/A 12/12/2021   Procedure: ESOPHAGOGASTRODUODENOSCOPY (EGD) WITH PROPOFOL;  Surgeon: Jaynie Collins, DO;  Location: [facility] ENDOSCOPY;  Service: Gastroenterology;  Laterality: N/A;   EYELID SURGERY Bilateral    GIVENS CAPSULE STUDY N/A 12/15/2021   Procedure: GIVENS CAPSULE STUDY;  Surgeon: Jaynie Collins, DO;  Location: [facility] ENDOSCOPY;  Service: Gastroenterology;  Laterality: N/A;   GIVENS CAPSULE STUDY N/A 01/28/2022   Procedure: GIVENS CAPSULE STUDY;  Surgeon: Toney Reil, MD;  Location: [facility] ENDOSCOPY;  Service: Gastroenterology;  Laterality: N/A;   GROWTH ON FACE      KNEE SURGERY Right    THROMBECTOMY FEMORAL ARTERY Left 11/16/2021   Procedure: THROMBECTOMY FEMORAL ARTERY;  Surgeon: Fransisco Hertz, MD;  Location: [facility] ORS;  Service: Vascular;  Laterality: Left;    SOCIAL HISTORY: Social History   Socioeconomic History   Marital status: Married    Spouse name: Not on file   Number of children: Not on file   Years of education: Not on file   Highest education level: Not on file  Occupational History   Not on file  Tobacco Use   Smoking status: Former    Types: Cigarettes    Quit date: 01/12/1993    Years since quitting: 29.4   Smokeless tobacco: Former    Types: Snuff, Chew   Tobacco comments:    DATE QUIT UNKNOWN  Vaping Use   Vaping Use: Never used  Substance and Sexual Activity   Alcohol use: No   Drug use: No   Sexual activity: Not on file  Other Topics Concern   Not on file  Social History Narrative   Not on file   Social Determinants of Health   Financial Resource Strain: Not on file  Food Insecurity: No Food Insecurity (02/18/2022)   Hunger Vital Sign    Worried About Running Out of Food in the Last Year: Never true    Ran Out of Food in the Last Year: Never true  Transportation Needs: No Transportation Needs (02/18/2022)   PRAPARE - Administrator, Civil Service (Medical): No    Lack of Transportation (Non-Medical): No  Physical Activity: Not on file  Stress: Not on file  Social Connections: Not on file  Intimate Partner Violence: Not At Risk (02/18/2022)   Humiliation, Afraid, Rape, and Kick questionnaire    Fear of Current or Ex-Partner: No    Emotionally Abused: No    Physically Abused: No    Sexually Abused: No    FAMILY HISTORY: Family History  Problem Relation Age of Onset   Heart disease Mother    AAA (abdominal aortic aneurysm) Mother    Heart disease Father     ALLERGIES:  is allergic to morphine, morphine and related, kenalog [triamcinolone acetonide], and penicillins.  MEDICATIONS:   Current Outpatient Medications  Medication Sig Dispense Refill   acetaminophen (TYLENOL) 325 MG tablet Take 2 tablets (650 mg total) by mouth every 6 (six) hours as needed for mild pain or fever.     amitriptyline (ELAVIL) 25 MG tablet Take 25 mg by mouth at bedtime.     cholecalciferol (VITAMIN D3) 25 MCG (1000 UT) tablet Take 1,000 Units by mouth daily.     cyanocobalamin (VITAMIN B12) 1000 MCG tablet Take 1,000 mcg by mouth.     Ferrous Sulfate (IRON) 325 (65 Fe) MG TABS Take 1 tablet by mouth daily.     loratadine (CLARITIN) 10 MG tablet Take 10 mg by mouth daily as needed.     pantoprazole (PROTONIX) 40 MG tablet Take 40 mg by mouth every morning.     pregabalin (LYRICA) 75 MG capsule Take 1 capsule (75 mg total) by mouth daily. 30 capsule 0   rosuvastatin (CRESTOR) 5 MG tablet Take 5 mg by mouth daily.     sertraline (ZOLOFT) 50 MG tablet Take 25 mg by mouth daily.  0   torsemide (DEMADEX) 100 MG tablet Take 0.5 tablets (50 mg total) by mouth daily. Increase to 1-1.5 tablet (100-150 mg total) by mouth ONCE daily (total daily dose 100-150 mg) as needed for up to 3 days for increased leg swelling, shortness of breath, weight gain 5+ lbs over 1-2 days. Seek medical care if these symptoms are not improving with increased dose.     clopidogrel (PLAVIX) 75 MG tablet Take by mouth.     potassium chloride SA (KLOR-CON M) 20 MEQ tablet Take 1 tablet (20 mEq total) by mouth daily. (Patient not taking: Reported on 07/01/2022) 60 tablet 3   No current facility-administered medications for this visit.     PHYSICAL EXAMINATION: ECOG PERFORMANCE STATUS: 1 - Symptomatic but completely ambulatory Vitals:   07/01/22 1318 07/01/22 1333  BP: (!) 77/59 (!) 99/43  Pulse: 66 65  Resp: 17   Temp: 98 F (36.7 C)   SpO2: 100%    Filed Weights   07/01/22 1318  Weight: 227 lb (103 kg)    Physical Exam Constitutional:      General: She is not in acute distress.    Appearance: She is obese.  HENT:      Head: Normocephalic  and atraumatic.  Eyes:     General: No scleral icterus.    Pupils: Pupils are equal, round, and reactive to light.  Cardiovascular:     Rate and Rhythm: Normal rate.     Heart sounds: Murmur heard.  Pulmonary:     Effort: Pulmonary effort is normal. No respiratory distress.  Abdominal:     General: Bowel sounds are normal. There is no distension.     Palpations: Abdomen is soft.  Musculoskeletal:        General: No deformity. Normal range of motion.     Cervical back: Normal range of motion.  Skin:    General: Skin is warm and dry.     Findings: No erythema or rash.  Neurological:     Mental Status: She is alert and oriented to person, place, and time. Mental status is at baseline.  Psychiatric:        Thought Content: Thought content normal.      LABORATORY DATA:  I have reviewed the data as listed     Latest Ref Rng & Units 06/29/2022   11:37 AM 04/21/2022   11:03 AM 03/18/2022   11:47 AM  CBC  WBC 4.0 - 10.5 K/uL 5.5  4.8  5.0   Hemoglobin 12.0 - 15.0 g/dL 7.3  40.9  8.3   Hematocrit 36.0 - 46.0 % 24.0  34.3  26.4   Platelets 150 - 400 K/uL 234  158  230       Latest Ref Rng & Units 03/03/2022    2:37 PM 02/19/2022    1:35 AM 02/18/2022   12:03 PM  CMP  Glucose 70 - 99 mg/dL 811  914  782   BUN 8 - 23 mg/dL 34  33  29   Creatinine 0.44 - 1.00 mg/dL 9.56  2.13  0.86   Sodium 135 - 145 mmol/L 138  141  139   Potassium 3.5 - 5.1 mmol/L 3.1  4.2  3.3   Chloride 98 - 111 mmol/L 102  106  104   CO2 22 - 32 mmol/L 29  27  26    Calcium 8.9 - 10.3 mg/dL 9.0  8.8  8.8   Total Protein 6.5 - 8.1 g/dL   6.4   Total Bilirubin 0.3 - 1.2 mg/dL   0.9   Alkaline Phos 38 - 126 U/L   49   AST 15 - 41 U/L   19   ALT 0 - 44 U/L   8     Iron/TIBC/Ferritin/ %Sat    Component Value Date/Time   IRON 226 (H) 06/29/2022 1137   TIBC 388 06/29/2022 1137   FERRITIN 19 06/29/2022 1137   IRONPCTSAT 58 (H) 06/29/2022 1137

## 2022-07-01 NOTE — Assessment & Plan Note (Signed)
Encourage oral hydration and avoid nephrotoxins.   

## 2022-07-01 NOTE — Assessment & Plan Note (Signed)
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 Previous Bone marrow biopsy results showed hypercellular marrow, no significant dysplastic features.  Cytogenetic normal. Labs are reviewed and discussed with patient. Lab Results  Component Value Date   IRON 226 (H) 06/29/2022   TIBC 388 06/29/2022   IRONPCTSAT 58 (H) 06/29/2022   FERRITIN 19 06/29/2022   HGB 7.3 (L) 06/29/2022   Hb has decreased, suspect ongoing GI blood loss.  Recommend IV Venofer weekly x 4

## 2022-07-09 ENCOUNTER — Inpatient Hospital Stay: Payer: Medicare Other

## 2022-07-09 ENCOUNTER — Inpatient Hospital Stay: Payer: Medicare Other | Attending: Oncology

## 2022-07-09 VITALS — BP 101/57 | HR 65 | Temp 98.6°F | Resp 18

## 2022-07-09 DIAGNOSIS — I4891 Unspecified atrial fibrillation: Secondary | ICD-10-CM | POA: Insufficient documentation

## 2022-07-09 DIAGNOSIS — Z7902 Long term (current) use of antithrombotics/antiplatelets: Secondary | ICD-10-CM | POA: Diagnosis not present

## 2022-07-09 DIAGNOSIS — D631 Anemia in chronic kidney disease: Secondary | ICD-10-CM | POA: Diagnosis not present

## 2022-07-09 DIAGNOSIS — Z79899 Other long term (current) drug therapy: Secondary | ICD-10-CM | POA: Diagnosis not present

## 2022-07-09 DIAGNOSIS — M542 Cervicalgia: Secondary | ICD-10-CM | POA: Insufficient documentation

## 2022-07-09 DIAGNOSIS — M2550 Pain in unspecified joint: Secondary | ICD-10-CM | POA: Insufficient documentation

## 2022-07-09 DIAGNOSIS — Z87891 Personal history of nicotine dependence: Secondary | ICD-10-CM | POA: Insufficient documentation

## 2022-07-09 DIAGNOSIS — Z8249 Family history of ischemic heart disease and other diseases of the circulatory system: Secondary | ICD-10-CM | POA: Insufficient documentation

## 2022-07-09 DIAGNOSIS — Z885 Allergy status to narcotic agent status: Secondary | ICD-10-CM | POA: Diagnosis not present

## 2022-07-09 DIAGNOSIS — N184 Chronic kidney disease, stage 4 (severe): Secondary | ICD-10-CM | POA: Insufficient documentation

## 2022-07-09 DIAGNOSIS — R5383 Other fatigue: Secondary | ICD-10-CM | POA: Diagnosis not present

## 2022-07-09 DIAGNOSIS — Z7952 Long term (current) use of systemic steroids: Secondary | ICD-10-CM | POA: Diagnosis not present

## 2022-07-09 DIAGNOSIS — M549 Dorsalgia, unspecified: Secondary | ICD-10-CM | POA: Insufficient documentation

## 2022-07-09 DIAGNOSIS — I509 Heart failure, unspecified: Secondary | ICD-10-CM | POA: Insufficient documentation

## 2022-07-09 DIAGNOSIS — D62 Acute posthemorrhagic anemia: Secondary | ICD-10-CM

## 2022-07-09 LAB — CBC WITH DIFFERENTIAL (CANCER CENTER ONLY)
Abs Immature Granulocytes: 0.05 10*3/uL (ref 0.00–0.07)
Basophils Absolute: 0 10*3/uL (ref 0.0–0.1)
Basophils Relative: 0 %
Eosinophils Absolute: 0.1 10*3/uL (ref 0.0–0.5)
Eosinophils Relative: 2 %
HCT: 24.1 % — ABNORMAL LOW (ref 36.0–46.0)
Hemoglobin: 7.2 g/dL — ABNORMAL LOW (ref 12.0–15.0)
Immature Granulocytes: 1 %
Lymphocytes Relative: 18 %
Lymphs Abs: 1 10*3/uL (ref 0.7–4.0)
MCH: 28.7 pg (ref 26.0–34.0)
MCHC: 29.9 g/dL — ABNORMAL LOW (ref 30.0–36.0)
MCV: 96 fL (ref 80.0–100.0)
Monocytes Absolute: 0.5 10*3/uL (ref 0.1–1.0)
Monocytes Relative: 8 %
Neutro Abs: 3.9 10*3/uL (ref 1.7–7.7)
Neutrophils Relative %: 71 %
Platelet Count: 225 10*3/uL (ref 150–400)
RBC: 2.51 MIL/uL — ABNORMAL LOW (ref 3.87–5.11)
RDW: 15.1 % (ref 11.5–15.5)
WBC Count: 5.5 10*3/uL (ref 4.0–10.5)
nRBC: 0 % (ref 0.0–0.2)

## 2022-07-09 LAB — RETIC PANEL
Immature Retic Fract: 22 % — ABNORMAL HIGH (ref 2.3–15.9)
RBC.: 2.52 MIL/uL — ABNORMAL LOW (ref 3.87–5.11)
Retic Count, Absolute: 124.7 10*3/uL (ref 19.0–186.0)
Retic Ct Pct: 5 % — ABNORMAL HIGH (ref 0.4–3.1)
Reticulocyte Hemoglobin: 24.6 pg — ABNORMAL LOW (ref >27.9)

## 2022-07-09 MED ORDER — SODIUM CHLORIDE 0.9 % IV SOLN
200.0000 mg | Freq: Once | INTRAVENOUS | Status: AC
  Administered 2022-07-09: 200 mg via INTRAVENOUS
  Filled 2022-07-09: qty 200

## 2022-07-09 MED ORDER — SODIUM CHLORIDE 0.9 % IV SOLN
Freq: Once | INTRAVENOUS | Status: AC
  Filled 2022-07-09: qty 250

## 2022-07-09 NOTE — Progress Notes (Signed)
Pt has been educated and understands. Pt declined to stay 30 mins after iron infusion. VSS.  

## 2022-07-10 ENCOUNTER — Other Ambulatory Visit: Payer: Self-pay | Admitting: Oncology

## 2022-07-13 ENCOUNTER — Telehealth: Payer: Self-pay

## 2022-07-13 DIAGNOSIS — D62 Acute posthemorrhagic anemia: Secondary | ICD-10-CM

## 2022-07-13 DIAGNOSIS — D631 Anemia in chronic kidney disease: Secondary | ICD-10-CM

## 2022-07-13 NOTE — Telephone Encounter (Signed)
Called and spoke to pt's daughter Victorino Dike and relayed resutls and MD recommendation. She is aware of plan but would like a call prior to setting up appts as she will be bringing patient:  Please schedule:  Lab (HH)/ retacrit this week  and the week of 5/20 (on different day from iron infusion) Keep venofer appts as scheduled

## 2022-07-13 NOTE — Telephone Encounter (Signed)
-----   Message from Rickard Patience, MD sent at 07/10/2022  5:17 PM EDT ----- Please arrange her to get lab H&H + retacrit q2 weeks x 2.  Keep venofer treatments.

## 2022-07-14 ENCOUNTER — Inpatient Hospital Stay: Payer: Medicare Other

## 2022-07-14 VITALS — BP 114/34 | HR 66 | Resp 19

## 2022-07-14 DIAGNOSIS — M549 Dorsalgia, unspecified: Secondary | ICD-10-CM | POA: Diagnosis not present

## 2022-07-14 DIAGNOSIS — R5383 Other fatigue: Secondary | ICD-10-CM | POA: Diagnosis not present

## 2022-07-14 DIAGNOSIS — Z7902 Long term (current) use of antithrombotics/antiplatelets: Secondary | ICD-10-CM | POA: Diagnosis not present

## 2022-07-14 DIAGNOSIS — Z885 Allergy status to narcotic agent status: Secondary | ICD-10-CM | POA: Diagnosis not present

## 2022-07-14 DIAGNOSIS — D631 Anemia in chronic kidney disease: Secondary | ICD-10-CM | POA: Diagnosis not present

## 2022-07-14 DIAGNOSIS — N184 Chronic kidney disease, stage 4 (severe): Secondary | ICD-10-CM | POA: Diagnosis not present

## 2022-07-14 DIAGNOSIS — Z7952 Long term (current) use of systemic steroids: Secondary | ICD-10-CM | POA: Diagnosis not present

## 2022-07-14 DIAGNOSIS — I509 Heart failure, unspecified: Secondary | ICD-10-CM | POA: Diagnosis not present

## 2022-07-14 DIAGNOSIS — I4891 Unspecified atrial fibrillation: Secondary | ICD-10-CM | POA: Diagnosis not present

## 2022-07-14 DIAGNOSIS — M2550 Pain in unspecified joint: Secondary | ICD-10-CM | POA: Diagnosis not present

## 2022-07-14 DIAGNOSIS — Z79899 Other long term (current) drug therapy: Secondary | ICD-10-CM | POA: Diagnosis not present

## 2022-07-14 DIAGNOSIS — Z8249 Family history of ischemic heart disease and other diseases of the circulatory system: Secondary | ICD-10-CM | POA: Diagnosis not present

## 2022-07-14 DIAGNOSIS — D62 Acute posthemorrhagic anemia: Secondary | ICD-10-CM

## 2022-07-14 DIAGNOSIS — M542 Cervicalgia: Secondary | ICD-10-CM | POA: Diagnosis not present

## 2022-07-14 DIAGNOSIS — Z87891 Personal history of nicotine dependence: Secondary | ICD-10-CM | POA: Diagnosis not present

## 2022-07-14 LAB — HEMOGLOBIN AND HEMATOCRIT (CANCER CENTER ONLY)
HCT: 24.1 % — ABNORMAL LOW (ref 36.0–46.0)
Hemoglobin: 7.3 g/dL — ABNORMAL LOW (ref 12.0–15.0)

## 2022-07-14 MED ORDER — EPOETIN ALFA-EPBX 10000 UNIT/ML IJ SOLN
30000.0000 [IU] | Freq: Once | INTRAMUSCULAR | Status: AC
  Administered 2022-07-14: 30000 [IU] via SUBCUTANEOUS
  Filled 2022-07-14: qty 3

## 2022-07-15 ENCOUNTER — Encounter: Payer: Self-pay | Admitting: Internal Medicine

## 2022-07-15 ENCOUNTER — Ambulatory Visit (HOSPITAL_BASED_OUTPATIENT_CLINIC_OR_DEPARTMENT_OTHER): Payer: Medicare Other | Admitting: Cardiology

## 2022-07-15 ENCOUNTER — Other Ambulatory Visit (HOSPITAL_COMMUNITY): Payer: Self-pay

## 2022-07-15 ENCOUNTER — Other Ambulatory Visit
Admission: RE | Admit: 2022-07-15 | Discharge: 2022-07-15 | Disposition: A | Discharge status: Home / Self Care | Payer: Medicare Other | Source: Ambulatory Visit | Attending: Cardiology | Admitting: Cardiology

## 2022-07-15 VITALS — BP 116/58 | HR 67 | Wt 232.6 lb

## 2022-07-15 DIAGNOSIS — I252 Old myocardial infarction: Secondary | ICD-10-CM | POA: Insufficient documentation

## 2022-07-15 DIAGNOSIS — D509 Iron deficiency anemia, unspecified: Secondary | ICD-10-CM | POA: Insufficient documentation

## 2022-07-15 DIAGNOSIS — N183 Chronic kidney disease, stage 3 unspecified: Secondary | ICD-10-CM | POA: Diagnosis not present

## 2022-07-15 DIAGNOSIS — Z86011 Personal history of benign neoplasm of the brain: Secondary | ICD-10-CM | POA: Diagnosis not present

## 2022-07-15 DIAGNOSIS — I129 Hypertensive chronic kidney disease with stage 1 through stage 4 chronic kidney disease, or unspecified chronic kidney disease: Secondary | ICD-10-CM | POA: Diagnosis not present

## 2022-07-15 DIAGNOSIS — E1122 Type 2 diabetes mellitus with diabetic chronic kidney disease: Secondary | ICD-10-CM | POA: Diagnosis not present

## 2022-07-15 DIAGNOSIS — Z7902 Long term (current) use of antithrombotics/antiplatelets: Secondary | ICD-10-CM | POA: Insufficient documentation

## 2022-07-15 DIAGNOSIS — Z79899 Other long term (current) drug therapy: Secondary | ICD-10-CM | POA: Insufficient documentation

## 2022-07-15 DIAGNOSIS — M1612 Unilateral primary osteoarthritis, left hip: Secondary | ICD-10-CM | POA: Diagnosis not present

## 2022-07-15 DIAGNOSIS — I5032 Chronic diastolic (congestive) heart failure: Secondary | ICD-10-CM | POA: Insufficient documentation

## 2022-07-15 DIAGNOSIS — Z8249 Family history of ischemic heart disease and other diseases of the circulatory system: Secondary | ICD-10-CM | POA: Insufficient documentation

## 2022-07-15 DIAGNOSIS — M48062 Spinal stenosis, lumbar region with neurogenic claudication: Secondary | ICD-10-CM | POA: Diagnosis not present

## 2022-07-15 DIAGNOSIS — N1832 Chronic kidney disease, stage 3b: Secondary | ICD-10-CM | POA: Diagnosis not present

## 2022-07-15 DIAGNOSIS — Z8673 Personal history of transient ischemic attack (TIA), and cerebral infarction without residual deficits: Secondary | ICD-10-CM | POA: Diagnosis not present

## 2022-07-15 DIAGNOSIS — I050 Rheumatic mitral stenosis: Secondary | ICD-10-CM | POA: Diagnosis not present

## 2022-07-15 DIAGNOSIS — I4891 Unspecified atrial fibrillation: Secondary | ICD-10-CM | POA: Diagnosis not present

## 2022-07-15 DIAGNOSIS — M316 Other giant cell arteritis: Secondary | ICD-10-CM | POA: Diagnosis not present

## 2022-07-15 DIAGNOSIS — E559 Vitamin D deficiency, unspecified: Secondary | ICD-10-CM | POA: Diagnosis not present

## 2022-07-15 DIAGNOSIS — G2581 Restless legs syndrome: Secondary | ICD-10-CM | POA: Diagnosis not present

## 2022-07-15 DIAGNOSIS — G8929 Other chronic pain: Secondary | ICD-10-CM | POA: Diagnosis not present

## 2022-07-15 DIAGNOSIS — G43909 Migraine, unspecified, not intractable, without status migrainosus: Secondary | ICD-10-CM | POA: Diagnosis not present

## 2022-07-15 DIAGNOSIS — Z87891 Personal history of nicotine dependence: Secondary | ICD-10-CM | POA: Insufficient documentation

## 2022-07-15 DIAGNOSIS — E11649 Type 2 diabetes mellitus with hypoglycemia without coma: Secondary | ICD-10-CM | POA: Diagnosis not present

## 2022-07-15 DIAGNOSIS — Z5982 Transportation insecurity: Secondary | ICD-10-CM | POA: Diagnosis not present

## 2022-07-15 DIAGNOSIS — I2510 Atherosclerotic heart disease of native coronary artery without angina pectoris: Secondary | ICD-10-CM | POA: Diagnosis not present

## 2022-07-15 DIAGNOSIS — I350 Nonrheumatic aortic (valve) stenosis: Secondary | ICD-10-CM | POA: Diagnosis not present

## 2022-07-15 DIAGNOSIS — I480 Paroxysmal atrial fibrillation: Secondary | ICD-10-CM | POA: Insufficient documentation

## 2022-07-15 DIAGNOSIS — Z556 Problems related to health literacy: Secondary | ICD-10-CM | POA: Diagnosis not present

## 2022-07-15 DIAGNOSIS — K219 Gastro-esophageal reflux disease without esophagitis: Secondary | ICD-10-CM | POA: Diagnosis not present

## 2022-07-15 DIAGNOSIS — M5416 Radiculopathy, lumbar region: Secondary | ICD-10-CM | POA: Diagnosis not present

## 2022-07-15 DIAGNOSIS — M80061D Age-related osteoporosis with current pathological fracture, right lower leg, subsequent encounter for fracture with routine healing: Secondary | ICD-10-CM | POA: Diagnosis not present

## 2022-07-15 DIAGNOSIS — I872 Venous insufficiency (chronic) (peripheral): Secondary | ICD-10-CM | POA: Diagnosis not present

## 2022-07-15 LAB — BASIC METABOLIC PANEL
Anion gap: 10 (ref 5–15)
BUN: 28 mg/dL — ABNORMAL HIGH (ref 8–23)
CO2: 29 mmol/L (ref 22–32)
Calcium: 9 mg/dL (ref 8.9–10.3)
Chloride: 99 mmol/L (ref 98–111)
Creatinine, Ser: 1.68 mg/dL — ABNORMAL HIGH (ref 0.44–1.00)
GFR, Estimated: 30 mL/min — ABNORMAL LOW (ref >60)
Glucose, Bld: 113 mg/dL — ABNORMAL HIGH (ref 70–99)
Potassium: 3.1 mmol/L — ABNORMAL LOW (ref 3.5–5.1)
Sodium: 138 mmol/L (ref 135–145)

## 2022-07-15 LAB — LIPID PANEL
Cholesterol: 106 mg/dL (ref 0–200)
HDL: 50 mg/dL (ref >40)
LDL Cholesterol: 36 mg/dL (ref 0–99)
Total CHOL/HDL Ratio: 2.1 RATIO
Triglycerides: 101 mg/dL (ref <150)
VLDL: 20 mg/dL (ref 0–40)

## 2022-07-15 LAB — BRAIN NATRIURETIC PEPTIDE: B Natriuretic Peptide: 278.1 pg/mL — ABNORMAL HIGH (ref 0.0–100.0)

## 2022-07-15 MED ORDER — TORSEMIDE 20 MG PO TABS: 80.0000 mg | ORAL_TABLET | Freq: Two times a day (BID) | ORAL | 3 refills | Status: DC

## 2022-07-15 MED ORDER — POTASSIUM CHLORIDE CRYS ER 20 MEQ PO TBCR: 40.0000 meq | EXTENDED_RELEASE_TABLET | Freq: Every day | ORAL | 3 refills | Status: DC

## 2022-07-15 MED ORDER — DAPAGLIFLOZIN PROPANEDIOL 10 MG PO TABS: 10.0000 mg | ORAL_TABLET | Freq: Every day | ORAL | 6 refills | Status: DC

## 2022-07-15 MED ORDER — METOLAZONE 2.5 MG PO TABS: 2.5000 mg | ORAL_TABLET | Freq: Every day | ORAL | 0 refills | Status: DC

## 2022-07-15 MED FILL — Iron Sucrose Inj 20 MG/ML (Fe Equiv): INTRAVENOUS | Qty: 10 | Status: AC

## 2022-07-15 NOTE — Patient Instructions (Signed)
Medication Changes:  Start taking Farxiga 10 mg (1 tablet) daily.  Start taking Torsemide 80 mg (4 tablets) two times a day.  Start taking Potassium 40 mEq (2 tablets) daily. Take Extra 20 mEq (1 tablet) on the days you take Metolazone.  Take Metolazone 2.5 mg (1 tablet)  once on Friday and Once on Monday am (no more after that)     Lab Work:  Labs done today, your results will be available in MyChart, we will contact you for abnormal readings.  You will need lab work again in 7 days. Please come to get your lab work on wednesday 5/15.  Testing/Procedures:  Your physician has requested that you have an echocardiogram. Echocardiography is a painless test that uses sound waves to create images of your heart. It provides your doctor with information about the size and shape of your heart and how well your heart's chambers and valves are working. This procedure takes approximately one hour. There are no restrictions for this procedure. Please do NOT wear cologne, perfume, aftershave, or lotions (deodorant is allowed). Please arrive 15 minutes prior to your appointment time.    Special Instructions // Education:  Do the following things EVERYDAY: Weigh yourself in the morning before breakfast. Write it down and keep it in a log. Take your medicines as prescribed Eat low salt foods--Limit salt (sodium) to 2000 mg per day.  Stay as active as you can everyday Limit all fluids for the day to less than 2 liters   Follow-Up in: follow up in 3 weeks with Dr. Shirlee Latch.    If you have any questions or concerns before your next appointment please send [place] a message through [place] or call our office at [phone] Monday-Friday 8 am-5 pm.   If you have an urgent need after hours on the weekend please call your Primary Cardiologist or the [facility] in [place] at [phone].

## 2022-07-15 NOTE — Progress Notes (Signed)
PCP: Ailene Ravel, MD HF Cardiology: Dr. Shirlee Latch  82 y.o. with history of CHF, PAD, and CAD was referred for MD CHF clinic evaluation by [facility].  Patient has a complicated history. She had an admission to [facility] in 12/22 with sepsis/UTI, she had respiratory failure with influenza A, she also was volume overloaded with CHF and was found to have NSTEMI with elevated troponin.  Cath this admission showed 80% ostial RCA, diffuse RCA disease, 50-60% pLAD. No intervention. She went into atrial fibrillation during this hospitalization as well, but Eliquis was stopped due to melena.   Patient has had severe chronic Fe-deficiency anemia.  She gets iron infusions.  Extensive GI workup has been unable to find a definite bleeding source.  She is followed by hematology.  She has not been on Eliquis.    Patient's left leg became ischemia in 9/23, peripheral angiography showed occluded left CFA. This was treated with iliofemoral endarterectomy and thrombectomy.  She was put on Plavix after this but told to only take it every other day.   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   Most recent echo in 9/23 showed EF 65-70%, normal RV, moderate mitral stenosis mean gradient 6, moderate AS mean gradient 31 with AVA 1.12 cm^2 and DI 0.29.  Patient has struggled recently with exertional dyspnea and peripheral edema.  She is currently taking torsemide 50 mg bid. She is short of breath walking around her house with a walker.  She sleeps in a recliner due to orthopnea.  She is short of breath dressing, showering, and doing housework.  No palpitations/syncope/lightheadedness.  No chest pain. She is also limited by left hip pain.   ECG (personally reviewed): Junctional rhythm with LAFB, RBBB  Labs (12/23): K 3.1, creatinine 1.96 Labs (5/24): hgb 7.3  PMH: 1. Fe deficiency anemia: Severe.  GI workup with EGD, colonoscopy, and capsule endoscopy has  been unrevealing.  2. PAD: Ischemic limb from occluded L CFA in 9/23.  Treated with iliofemoral endarterectomy and thrombectomy.  - ABIs (9/24): 1.27 on right, 1.08 on left.  3. Atrial fibrillation: Paroxysmal, first noted in 12/22.  Not on Eliquis due to anemia with presumed GI bleeding.  4. CKD stage 3b 5. Temporal arteritis: Was on chronic steroids, has now stopped.  Followed by rheumatology.  6. CAD: NSTEMI 12/22 in setting of sepsis/UTI.  LHC in 12/22 showed 80% ostial RCA, diffuse RCA disease, 50-60% pLAD. No intervention.  7. Chronic diastolic CHF: Echo in 9/23 with EF 65-70%, normal RV, moderate mitral stenosis mean gradient 6, moderate AS mean gradient 31 with AVA 1.12 cm^2 and DI 0.29.  8. Aortic stenosis: Moderate on 9/23 echo.  9. Mitral stenosis: Moderate on 9/23 echo.   Social History   Socioeconomic History   Marital status: Married    Spouse name: Not on file   Number of children: Not on file   Years of education: Not on file   Highest education level: Not on file  Occupational History   Not on file  Tobacco Use   Smoking status: Former    Types: Cigarettes    Quit date: 01/12/1993    Years since quitting: 29.5   Smokeless tobacco: Former    Types: Snuff, Chew   Tobacco comments:    DATE QUIT UNKNOWN  Vaping Use   Vaping Use: Never used  Substance and Sexual Activity   Alcohol use: No   Drug use:  No   Sexual activity: Not on file  Other Topics Concern   Not on file  Social History Narrative   Not on file   Social Determinants of Health   Financial Resource Strain: Not on file  Food Insecurity: No Food Insecurity (02/18/2022)   Hunger Vital Sign    Worried About Running Out of Food in the Last Year: Never true    Ran Out of Food in the Last Year: Never true  Transportation Needs: No Transportation Needs (02/18/2022)   PRAPARE - Administrator, Civil Service (Medical): No    Lack of Transportation (Non-Medical): No  Physical Activity: Not on  file  Stress: Not on file  Social Connections: Not on file  Intimate Partner Violence: Not At Risk (02/18/2022)   Humiliation, Afraid, Rape, and Kick questionnaire    Fear of Current or Ex-Partner: No    Emotionally Abused: No    Physically Abused: No    Sexually Abused: No   Family History  Problem Relation Age of Onset   Heart disease Mother    AAA (abdominal aortic aneurysm) Mother    Heart disease Father    ROS: All systems reviewed and negative except as per HPI.   Current Outpatient Medications  Medication Sig Dispense Refill   acetaminophen (TYLENOL) 325 MG tablet Take 2 tablets (650 mg total) by mouth every 6 (six) hours as needed for mild pain or fever.     amitriptyline (ELAVIL) 25 MG tablet Take 25 mg by mouth at bedtime.     cholecalciferol (VITAMIN D3) 25 MCG (1000 UT) tablet Take 1,000 Units by mouth daily.     clopidogrel (PLAVIX) 75 MG tablet Take by mouth.     cyanocobalamin (VITAMIN B12) 1000 MCG tablet Take 1,000 mcg by mouth.     dapagliflozin propanediol (FARXIGA) 10 MG TABS tablet Take 1 tablet (10 mg total) by mouth daily. 30 tablet 6   Ferrous Sulfate (IRON) 325 (65 Fe) MG TABS Take 1 tablet by mouth daily.     loratadine (CLARITIN) 10 MG tablet Take 10 mg by mouth daily as needed.     metolazone (ZAROXOLYN) 2.5 MG tablet Take 1 tablet (2.5 mg total) by mouth daily. 5 tablet 0   pantoprazole (PROTONIX) 40 MG tablet Take 40 mg by mouth every morning.     potassium chloride SA (KLOR-CON M) 20 MEQ tablet Take 2 tablets (40 mEq total) by mouth daily. 90 tablet 3   pregabalin (LYRICA) 75 MG capsule Take 1 capsule (75 mg total) by mouth daily. 30 capsule 0   rosuvastatin (CRESTOR) 5 MG tablet Take 5 mg by mouth daily.     sertraline (ZOLOFT) 50 MG tablet Take 25 mg by mouth daily.  0   torsemide (DEMADEX) 20 MG tablet Take 4 tablets (80 mg total) by mouth 2 (two) times daily. 240 tablet 3   No current facility-administered medications for this visit.   BP (!)  116/58   Pulse 67   Wt 232 lb 9.6 oz (105.5 kg)   SpO2 97%   BMI 42.54 kg/m  General: NAD Neck: JVP 12-13 cm, no thyromegaly or thyroid nodule.  Lungs: Clear to auscultation bilaterally with normal respiratory effort. CV: Nondisplaced PMI.  Heart regular S1/S2, no S3/S4, 3/6 SEM RUSB with muffled S2.  2+ edema to knees.  No carotid bruit.  Unable to palpate pedal pulses.  Abdomen: Soft, nontender, no hepatosplenomegaly, no distention.  Skin: Intact without lesions or rashes.  Neurologic:  Alert and oriented x 3.  Psych: Normal affect. Extremities: No clubbing or cyanosis.  HEENT: Normal.   Assessment/Plan: 1. Chronic diastolic CHF: Echo in 9/23 showed EF 65-70%, normal RV, moderate mitral stenosis mean gradient 6, moderate AS mean gradient 31 with AVA 1.12 cm^2 and DI 0.29.  I suspect that valvular heart disease (moderate, ?severe AS and moderate MS) is contributing to HFpEF.  She is markedly volume overloaded on exam with NYHA class III symptoms.  She does not feel that current torsemide dosing is working.  - Increase torsemide to 80 mg bid.  - I will have her take metolazone 2.5 x 1 on Friday morning and again on Monday morning with am torsemide dose.  She will not take additional metolazone after this unless we direct her to do so.  - She will need to take KCl 40 daily with extra 20 daily on metolazone days.  - BMET/BNP today and BMET in 1 week.  - Start Farxiga 10 mg daily.  - I will have her get repeat echo to assess valves (see below).  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  Murmur is suggestive of severe AS.  - I am going to repeat echo.  - If AS is severe, she will need RHC/LHC using minimal contrast and evaluation for TAVR. Advanced CKD will make this more difficult.  3. Atrial fibrillation: Noted in 12/22 during admission with sepsis/CHF/UTI/influenza A.  Apixaban started then stopped due to melena. She has had chronically very low hemoglobin  and anticoagulation has not been restarted. She appears to be in a junctional rhythm today.  - Will leave off apixaban for now.  4. Anemia: Fe deficiency anemia, no definite source for GI bleeding has been found on endoscopic investigation. Heyde Syndrome in setting of aortic stenosis is a possibility (cleavage of von Willebrand multimers) though AVMs have not been identified on endoscopic evaluation.  - CBC low recently, set up for Fe infusions.  - If Heyde Syndrome, anemia may improve with AVR.  5. PAD: 11/23 angiography with L CFA occlusion requiring left iliofemoral endarterectomy and thrombectomy.  Followed by vascular surgery.  - Continue statin.  - She is on Plavix.  6. CAD: NSTEMI 11/22 at [facility].  Extensive RCA disease, no intervention.  She denies chest pain.  - Continue statin - Continue Plavix. I would rather her take this every day then every other day.  7. CKD stage 3b: Check BMET today.   Followup with me in 3 wks, would like echo done prior.   Marca Ancona 07/15/2022

## 2022-07-16 ENCOUNTER — Telehealth (HOSPITAL_COMMUNITY): Payer: Self-pay

## 2022-07-16 ENCOUNTER — Inpatient Hospital Stay: Payer: Medicare Other

## 2022-07-16 VITALS — BP 95/41 | HR 61 | Temp 98.4°F | Resp 20

## 2022-07-16 DIAGNOSIS — Z87891 Personal history of nicotine dependence: Secondary | ICD-10-CM | POA: Diagnosis not present

## 2022-07-16 DIAGNOSIS — I4891 Unspecified atrial fibrillation: Secondary | ICD-10-CM | POA: Diagnosis not present

## 2022-07-16 DIAGNOSIS — M2550 Pain in unspecified joint: Secondary | ICD-10-CM | POA: Diagnosis not present

## 2022-07-16 DIAGNOSIS — N184 Chronic kidney disease, stage 4 (severe): Secondary | ICD-10-CM | POA: Diagnosis not present

## 2022-07-16 DIAGNOSIS — D62 Acute posthemorrhagic anemia: Secondary | ICD-10-CM

## 2022-07-16 DIAGNOSIS — I509 Heart failure, unspecified: Secondary | ICD-10-CM | POA: Diagnosis not present

## 2022-07-16 DIAGNOSIS — M549 Dorsalgia, unspecified: Secondary | ICD-10-CM | POA: Diagnosis not present

## 2022-07-16 DIAGNOSIS — I214 Non-ST elevation (NSTEMI) myocardial infarction: Secondary | ICD-10-CM | POA: Diagnosis not present

## 2022-07-16 DIAGNOSIS — D631 Anemia in chronic kidney disease: Secondary | ICD-10-CM | POA: Diagnosis not present

## 2022-07-16 DIAGNOSIS — Z7902 Long term (current) use of antithrombotics/antiplatelets: Secondary | ICD-10-CM | POA: Diagnosis not present

## 2022-07-16 DIAGNOSIS — R5383 Other fatigue: Secondary | ICD-10-CM | POA: Diagnosis not present

## 2022-07-16 DIAGNOSIS — Z79899 Other long term (current) drug therapy: Secondary | ICD-10-CM | POA: Diagnosis not present

## 2022-07-16 DIAGNOSIS — Z8249 Family history of ischemic heart disease and other diseases of the circulatory system: Secondary | ICD-10-CM | POA: Diagnosis not present

## 2022-07-16 DIAGNOSIS — Z7952 Long term (current) use of systemic steroids: Secondary | ICD-10-CM | POA: Diagnosis not present

## 2022-07-16 DIAGNOSIS — Z885 Allergy status to narcotic agent status: Secondary | ICD-10-CM | POA: Diagnosis not present

## 2022-07-16 DIAGNOSIS — M542 Cervicalgia: Secondary | ICD-10-CM | POA: Diagnosis not present

## 2022-07-16 MED ORDER — POTASSIUM CHLORIDE CRYS ER 20 MEQ PO TBCR: 60.0000 meq | EXTENDED_RELEASE_TABLET | Freq: Every day | ORAL | 3 refills | Status: DC

## 2022-07-16 MED ORDER — SODIUM CHLORIDE 0.9 % IV SOLN
200.0000 mg | Freq: Once | INTRAVENOUS | Status: AC
  Administered 2022-07-16: 200 mg via INTRAVENOUS
  Filled 2022-07-16: qty 10

## 2022-07-16 MED ORDER — SODIUM CHLORIDE 0.9 % IV SOLN
Freq: Once | INTRAVENOUS | Status: AC
  Filled 2022-07-16: qty 250

## 2022-07-16 NOTE — Telephone Encounter (Signed)
Patients daughter, Victorino Dike advised and verbalized understanding. Med list updated to reflect changes.   Meds ordered this encounter  Medications   potassium chloride SA (KLOR-CON M) 20 MEQ tablet    Sig: Take 3 tablets (60 mEq total) by mouth daily. Take an extra 2 tablets on your metolazone days.    Dispense:  98 tablet    Refill:  3    Please cancel all previous orders for current medication. Change in dosage or pill size.

## 2022-07-16 NOTE — Patient Instructions (Signed)
[facility]  Discharge Instructions: Thank you for choosing [facility] to provide your oncology and hematology care.  If you have a lab appointment with the Cancer Center, please go directly to the Cancer Center and check in at the registration area.  Wear comfortable clothing and clothing appropriate for easy access to any Portacath or PICC line.   We strive to give you quality time with your provider. You may need to reschedule your appointment if you arrive late (15 or more minutes).  Arriving late affects you and other patients whose appointments are after yours.  Also, if you miss three or more appointments without notifying the office, you may be dismissed from the clinic at the provider's discretion.      For prescription refill requests, have your pharmacy contact our office and allow 72 hours for refills to be completed.    Today you received the following chemotherapy and/or immunotherapy agents Venofer.      To help prevent nausea and vomiting after your treatment, we encourage you to take your nausea medication as directed.  BELOW ARE SYMPTOMS THAT SHOULD BE REPORTED IMMEDIATELY: *FEVER GREATER THAN 100.4 F (38 C) OR HIGHER *CHILLS OR SWEATING *NAUSEA AND VOMITING THAT IS NOT CONTROLLED WITH YOUR NAUSEA MEDICATION *UNUSUAL SHORTNESS OF BREATH *UNUSUAL BRUISING OR BLEEDING *URINARY PROBLEMS (pain or burning when urinating, or frequent urination) *BOWEL PROBLEMS (unusual diarrhea, constipation, pain near the anus) TENDERNESS IN MOUTH AND THROAT WITH OR WITHOUT PRESENCE OF ULCERS (sore throat, sores in mouth, or a toothache) UNUSUAL RASH, SWELLING OR PAIN  UNUSUAL VAGINAL DISCHARGE OR ITCHING   Items with * indicate a potential emergency and should be followed up as soon as possible or go to the Emergency Department if any problems should occur.  Please show the CHEMOTHERAPY ALERT CARD or IMMUNOTHERAPY ALERT CARD at check-in to  the Emergency Department and triage nurse.  Should you have questions after your visit or need to cancel or reschedule your appointment, please contact [facility]  [phone] and follow the prompts.  Office hours are 8:00 a.m. to 4:30 p.m. Monday - Friday. Please note that voicemails left after 4:00 p.m. may not be returned until the following business day.  We are closed weekends and major holidays. You have access to a nurse at all times for urgent questions. Please call the main number to the clinic [phone] and follow the prompts.  For any non-urgent questions, you may also contact your provider using MyChart. We now offer e-Visits for anyone 18 and older to request care online for non-urgent symptoms. For details visit [email or web address].   Also download the MyChart app! Go to the app store, search "MyChart", open the app, select [facility], and log in with your MyChart username and password.   

## 2022-07-16 NOTE — Telephone Encounter (Signed)
-----   Message from Laurey Morale, MD sent at 07/16/2022  8:51 AM EDT ----- Lisabeth Devoid she should increase K to 60 daily with 20 extra on metolazone days.

## 2022-07-18 ENCOUNTER — Telehealth: Payer: Self-pay | Admitting: Cardiology

## 2022-07-18 ENCOUNTER — Emergency Department: Payer: Medicare Other

## 2022-07-18 ENCOUNTER — Other Ambulatory Visit: Payer: Self-pay

## 2022-07-18 DIAGNOSIS — I480 Paroxysmal atrial fibrillation: Secondary | ICD-10-CM | POA: Diagnosis present

## 2022-07-18 DIAGNOSIS — E876 Hypokalemia: Secondary | ICD-10-CM | POA: Diagnosis present

## 2022-07-18 DIAGNOSIS — Z888 Allergy status to other drugs, medicaments and biological substances status: Secondary | ICD-10-CM | POA: Diagnosis not present

## 2022-07-18 DIAGNOSIS — M810 Age-related osteoporosis without current pathological fracture: Secondary | ICD-10-CM | POA: Diagnosis present

## 2022-07-18 DIAGNOSIS — F32A Depression, unspecified: Secondary | ICD-10-CM | POA: Diagnosis present

## 2022-07-18 DIAGNOSIS — I130 Hypertensive heart and chronic kidney disease with heart failure and stage 1 through stage 4 chronic kidney disease, or unspecified chronic kidney disease: Principal | ICD-10-CM | POA: Diagnosis present

## 2022-07-18 DIAGNOSIS — Z91040 Latex allergy status: Secondary | ICD-10-CM | POA: Diagnosis not present

## 2022-07-18 DIAGNOSIS — R0789 Other chest pain: Secondary | ICD-10-CM | POA: Diagnosis not present

## 2022-07-18 DIAGNOSIS — D509 Iron deficiency anemia, unspecified: Secondary | ICD-10-CM | POA: Diagnosis not present

## 2022-07-18 DIAGNOSIS — N1832 Chronic kidney disease, stage 3b: Secondary | ICD-10-CM | POA: Diagnosis not present

## 2022-07-18 DIAGNOSIS — K219 Gastro-esophageal reflux disease without esophagitis: Secondary | ICD-10-CM | POA: Diagnosis present

## 2022-07-18 DIAGNOSIS — I482 Chronic atrial fibrillation, unspecified: Secondary | ICD-10-CM | POA: Diagnosis not present

## 2022-07-18 DIAGNOSIS — N179 Acute kidney failure, unspecified: Secondary | ICD-10-CM | POA: Diagnosis not present

## 2022-07-18 DIAGNOSIS — I739 Peripheral vascular disease, unspecified: Secondary | ICD-10-CM | POA: Diagnosis present

## 2022-07-18 DIAGNOSIS — I252 Old myocardial infarction: Secondary | ICD-10-CM

## 2022-07-18 DIAGNOSIS — I959 Hypotension, unspecified: Secondary | ICD-10-CM | POA: Diagnosis present

## 2022-07-18 DIAGNOSIS — M7989 Other specified soft tissue disorders: Secondary | ICD-10-CM | POA: Diagnosis not present

## 2022-07-18 DIAGNOSIS — Z880 Allergy status to penicillin: Secondary | ICD-10-CM

## 2022-07-18 DIAGNOSIS — D631 Anemia in chronic kidney disease: Secondary | ICD-10-CM | POA: Diagnosis present

## 2022-07-18 DIAGNOSIS — I5033 Acute on chronic diastolic (congestive) heart failure: Secondary | ICD-10-CM | POA: Diagnosis not present

## 2022-07-18 DIAGNOSIS — I110 Hypertensive heart disease with heart failure: Secondary | ICD-10-CM | POA: Diagnosis not present

## 2022-07-18 DIAGNOSIS — Z885 Allergy status to narcotic agent status: Secondary | ICD-10-CM

## 2022-07-18 DIAGNOSIS — I080 Rheumatic disorders of both mitral and aortic valves: Secondary | ICD-10-CM | POA: Diagnosis present

## 2022-07-18 DIAGNOSIS — I2489 Other forms of acute ischemic heart disease: Secondary | ICD-10-CM | POA: Diagnosis not present

## 2022-07-18 DIAGNOSIS — Z79899 Other long term (current) drug therapy: Secondary | ICD-10-CM

## 2022-07-18 DIAGNOSIS — R0602 Shortness of breath: Secondary | ICD-10-CM | POA: Diagnosis not present

## 2022-07-18 DIAGNOSIS — I2510 Atherosclerotic heart disease of native coronary artery without angina pectoris: Secondary | ICD-10-CM | POA: Diagnosis not present

## 2022-07-18 DIAGNOSIS — Z7902 Long term (current) use of antithrombotics/antiplatelets: Secondary | ICD-10-CM

## 2022-07-18 DIAGNOSIS — I5023 Acute on chronic systolic (congestive) heart failure: Secondary | ICD-10-CM | POA: Diagnosis not present

## 2022-07-18 DIAGNOSIS — Z6841 Body Mass Index (BMI) 40.0 and over, adult: Secondary | ICD-10-CM

## 2022-07-18 DIAGNOSIS — Z8249 Family history of ischemic heart disease and other diseases of the circulatory system: Secondary | ICD-10-CM | POA: Diagnosis not present

## 2022-07-18 LAB — BASIC METABOLIC PANEL
Anion gap: 8 (ref 5–15)
BUN: 37 mg/dL — ABNORMAL HIGH (ref 8–23)
CO2: 30 mmol/L (ref 22–32)
Calcium: 9.1 mg/dL (ref 8.9–10.3)
Chloride: 99 mmol/L (ref 98–111)
Creatinine, Ser: 2.15 mg/dL — ABNORMAL HIGH (ref 0.44–1.00)
GFR, Estimated: 22 mL/min — ABNORMAL LOW (ref >60)
Glucose, Bld: 117 mg/dL — ABNORMAL HIGH (ref 70–99)
Potassium: 3.3 mmol/L — ABNORMAL LOW (ref 3.5–5.1)
Sodium: 137 mmol/L (ref 135–145)

## 2022-07-18 LAB — CBC
HCT: 27.2 % — ABNORMAL LOW (ref 36.0–46.0)
Hemoglobin: 8 g/dL — ABNORMAL LOW (ref 12.0–15.0)
MCH: 28.1 pg (ref 26.0–34.0)
MCHC: 29.4 g/dL — ABNORMAL LOW (ref 30.0–36.0)
MCV: 95.4 fL (ref 80.0–100.0)
Platelets: 300 10*3/uL (ref 150–400)
RBC: 2.85 MIL/uL — ABNORMAL LOW (ref 3.87–5.11)
RDW: 15.3 % (ref 11.5–15.5)
WBC: 6.3 10*3/uL (ref 4.0–10.5)
nRBC: 0 % (ref 0.0–0.2)

## 2022-07-18 LAB — BRAIN NATRIURETIC PEPTIDE: B Natriuretic Peptide: 209.3 pg/mL — ABNORMAL HIGH (ref 0.0–100.0)

## 2022-07-18 NOTE — Telephone Encounter (Signed)
Patient has HFpEF, CKD, aortic stenosis and MS.  Complex cardiac history Recently seen by Dr. Shirlee Latch and increased torsemide and give metolazone  Patient calls today states he is barely making urine, probably got 1 pound weight down Her leg are very swollen almost glasslike appearance and about to burst Denies any chest pain, shortness of breath, she is morbidly obese and ambulates with wheelchair   Given lack of outpatient diuretic response, severely swollen legs I told her to go to the nearest hospital for evaluation and get IV Lasix. -She will go to [facility] hospital, after evaluation if she is markedly volume overloaded and need more diuresis then she can be admitted there OR send to [facility] for IV diuresis.  Patient will go to the ER

## 2022-07-18 NOTE — ED Notes (Signed)
Attempted to collect blood work with no success. Lab called to attempt

## 2022-07-18 NOTE — ED Triage Notes (Signed)
Pt to ED via POV c/o bilateral leg swelling. Pt states she went to see heart doctor Friday and was prescribed fluid pills. Pt reports not feeling like it is working. Denies CP, SOB, fever

## 2022-07-19 ENCOUNTER — Encounter: Payer: Self-pay | Admitting: Internal Medicine

## 2022-07-19 ENCOUNTER — Inpatient Hospital Stay
Admission: EM | Admit: 2022-07-19 | Discharge: 2022-07-22 | DRG: 291 | Disposition: A | Discharge status: Home Health | Payer: Medicare Other | Attending: Internal Medicine | Admitting: Internal Medicine

## 2022-07-19 DIAGNOSIS — I5031 Acute diastolic (congestive) heart failure: Secondary | ICD-10-CM | POA: Diagnosis not present

## 2022-07-19 DIAGNOSIS — N1832 Chronic kidney disease, stage 3b: Secondary | ICD-10-CM | POA: Diagnosis not present

## 2022-07-19 DIAGNOSIS — K219 Gastro-esophageal reflux disease without esophagitis: Secondary | ICD-10-CM | POA: Diagnosis present

## 2022-07-19 DIAGNOSIS — Z6841 Body Mass Index (BMI) 40.0 and over, adult: Secondary | ICD-10-CM | POA: Diagnosis not present

## 2022-07-19 DIAGNOSIS — I959 Hypotension, unspecified: Secondary | ICD-10-CM | POA: Diagnosis not present

## 2022-07-19 DIAGNOSIS — Z91040 Latex allergy status: Secondary | ICD-10-CM | POA: Diagnosis not present

## 2022-07-19 DIAGNOSIS — R7989 Other specified abnormal findings of blood chemistry: Secondary | ICD-10-CM

## 2022-07-19 DIAGNOSIS — I5033 Acute on chronic diastolic (congestive) heart failure: Secondary | ICD-10-CM | POA: Diagnosis not present

## 2022-07-19 DIAGNOSIS — Z885 Allergy status to narcotic agent status: Secondary | ICD-10-CM | POA: Diagnosis not present

## 2022-07-19 DIAGNOSIS — I2510 Atherosclerotic heart disease of native coronary artery without angina pectoris: Secondary | ICD-10-CM | POA: Diagnosis not present

## 2022-07-19 DIAGNOSIS — I509 Heart failure, unspecified: Principal | ICD-10-CM

## 2022-07-19 DIAGNOSIS — I739 Peripheral vascular disease, unspecified: Secondary | ICD-10-CM | POA: Diagnosis present

## 2022-07-19 DIAGNOSIS — I9589 Other hypotension: Secondary | ICD-10-CM | POA: Diagnosis not present

## 2022-07-19 DIAGNOSIS — Z8249 Family history of ischemic heart disease and other diseases of the circulatory system: Secondary | ICD-10-CM | POA: Diagnosis not present

## 2022-07-19 DIAGNOSIS — D631 Anemia in chronic kidney disease: Secondary | ICD-10-CM

## 2022-07-19 DIAGNOSIS — I350 Nonrheumatic aortic (valve) stenosis: Secondary | ICD-10-CM | POA: Diagnosis not present

## 2022-07-19 DIAGNOSIS — M810 Age-related osteoporosis without current pathological fracture: Secondary | ICD-10-CM | POA: Diagnosis present

## 2022-07-19 DIAGNOSIS — E876 Hypokalemia: Secondary | ICD-10-CM | POA: Diagnosis present

## 2022-07-19 DIAGNOSIS — R600 Localized edema: Secondary | ICD-10-CM

## 2022-07-19 DIAGNOSIS — Z888 Allergy status to other drugs, medicaments and biological substances status: Secondary | ICD-10-CM | POA: Diagnosis not present

## 2022-07-19 DIAGNOSIS — F32A Depression, unspecified: Secondary | ICD-10-CM | POA: Diagnosis present

## 2022-07-19 DIAGNOSIS — D509 Iron deficiency anemia, unspecified: Secondary | ICD-10-CM | POA: Diagnosis present

## 2022-07-19 DIAGNOSIS — N179 Acute kidney failure, unspecified: Secondary | ICD-10-CM | POA: Diagnosis not present

## 2022-07-19 DIAGNOSIS — Z880 Allergy status to penicillin: Secondary | ICD-10-CM | POA: Diagnosis not present

## 2022-07-19 DIAGNOSIS — R0609 Other forms of dyspnea: Secondary | ICD-10-CM

## 2022-07-19 DIAGNOSIS — I2489 Other forms of acute ischemic heart disease: Secondary | ICD-10-CM | POA: Diagnosis present

## 2022-07-19 DIAGNOSIS — N183 Chronic kidney disease, stage 3 unspecified: Secondary | ICD-10-CM | POA: Diagnosis not present

## 2022-07-19 DIAGNOSIS — I351 Nonrheumatic aortic (valve) insufficiency: Secondary | ICD-10-CM | POA: Diagnosis not present

## 2022-07-19 DIAGNOSIS — R0602 Shortness of breath: Secondary | ICD-10-CM

## 2022-07-19 DIAGNOSIS — I482 Chronic atrial fibrillation, unspecified: Secondary | ICD-10-CM | POA: Diagnosis present

## 2022-07-19 DIAGNOSIS — Z8719 Personal history of other diseases of the digestive system: Secondary | ICD-10-CM

## 2022-07-19 DIAGNOSIS — R079 Chest pain, unspecified: Secondary | ICD-10-CM

## 2022-07-19 DIAGNOSIS — I480 Paroxysmal atrial fibrillation: Secondary | ICD-10-CM | POA: Diagnosis present

## 2022-07-19 DIAGNOSIS — I080 Rheumatic disorders of both mitral and aortic valves: Secondary | ICD-10-CM | POA: Diagnosis present

## 2022-07-19 DIAGNOSIS — I130 Hypertensive heart and chronic kidney disease with heart failure and stage 1 through stage 4 chronic kidney disease, or unspecified chronic kidney disease: Secondary | ICD-10-CM | POA: Diagnosis present

## 2022-07-19 LAB — URINALYSIS, ROUTINE W REFLEX MICROSCOPIC
Bilirubin Urine: NEGATIVE
Glucose, UA: NEGATIVE mg/dL
Hgb urine dipstick: NEGATIVE
Ketones, ur: NEGATIVE mg/dL
Nitrite: NEGATIVE
Protein, ur: NEGATIVE mg/dL
Specific Gravity, Urine: 1.015 (ref 1.005–1.030)
pH: 7 (ref 5.0–8.0)

## 2022-07-19 LAB — CBC
HCT: 25.3 % — ABNORMAL LOW (ref 36.0–46.0)
Hemoglobin: 7.4 g/dL — ABNORMAL LOW (ref 12.0–15.0)
MCH: 28.2 pg (ref 26.0–34.0)
MCHC: 29.2 g/dL — ABNORMAL LOW (ref 30.0–36.0)
MCV: 96.6 fL (ref 80.0–100.0)
Platelets: 264 10*3/uL (ref 150–400)
RBC: 2.62 MIL/uL — ABNORMAL LOW (ref 3.87–5.11)
RDW: 15.3 % (ref 11.5–15.5)
WBC: 5.3 10*3/uL (ref 4.0–10.5)
nRBC: 0 % (ref 0.0–0.2)

## 2022-07-19 LAB — URINALYSIS, MICROSCOPIC (REFLEX): RBC / HPF: NONE SEEN RBC/hpf (ref 0–5)

## 2022-07-19 LAB — CREATININE, SERUM
Creatinine, Ser: 2.14 mg/dL — ABNORMAL HIGH (ref 0.44–1.00)
GFR, Estimated: 23 mL/min — ABNORMAL LOW (ref >60)

## 2022-07-19 LAB — MAGNESIUM: Magnesium: 2.1 mg/dL (ref 1.7–2.4)

## 2022-07-19 LAB — TROPONIN I (HIGH SENSITIVITY): Troponin I (High Sensitivity): 26 ng/L — ABNORMAL HIGH (ref <18)

## 2022-07-19 LAB — POTASSIUM: Potassium: 3.4 mmol/L — ABNORMAL LOW (ref 3.5–5.1)

## 2022-07-19 MED ORDER — FUROSEMIDE 10 MG/ML IJ SOLN
4.0000 mg/h | INTRAVENOUS | Status: DC
  Filled 2022-07-19: qty 20

## 2022-07-19 MED ORDER — AMITRIPTYLINE HCL 25 MG PO TABS: 25.0000 mg | ORAL_TABLET | Freq: Every evening | ORAL | Status: DC | PRN

## 2022-07-19 MED ORDER — FERROUS SULFATE 325 (65 FE) MG PO TABS
325.0000 mg | ORAL_TABLET | Freq: Every day | ORAL | Status: DC
  Administered 2022-07-19 – 2022-07-22 (×4): 325 mg via ORAL
  Filled 2022-07-19 (×4): qty 1

## 2022-07-19 MED ORDER — FUROSEMIDE 10 MG/ML IJ SOLN
4.0000 mg/h | INTRAVENOUS | Status: DC
  Administered 2022-07-19: 4 mg/h via INTRAVENOUS
  Filled 2022-07-19: qty 20

## 2022-07-19 MED ORDER — ONDANSETRON HCL 4 MG PO TABS: 4.0000 mg | ORAL_TABLET | Freq: Four times a day (QID) | ORAL | Status: DC | PRN

## 2022-07-19 MED ORDER — ENOXAPARIN SODIUM 40 MG/0.4ML IJ SOSY
40.0000 mg | PREFILLED_SYRINGE | INTRAMUSCULAR | Status: DC
  Administered 2022-07-19 – 2022-07-20 (×2): 40 mg via SUBCUTANEOUS
  Filled 2022-07-19 (×2): qty 0.4

## 2022-07-19 MED ORDER — BUMETANIDE 0.25 MG/ML IJ SOLN
1.0000 mg | Freq: Once | INTRAMUSCULAR | Status: AC
  Administered 2022-07-19: 1 mg via INTRAVENOUS
  Filled 2022-07-19: qty 4

## 2022-07-19 MED ORDER — POTASSIUM CHLORIDE CRYS ER 20 MEQ PO TBCR
60.0000 meq | EXTENDED_RELEASE_TABLET | Freq: Every day | ORAL | Status: DC
  Administered 2022-07-19 – 2022-07-22 (×4): 60 meq via ORAL
  Filled 2022-07-19 (×4): qty 3

## 2022-07-19 MED ORDER — ROSUVASTATIN CALCIUM 5 MG PO TABS
5.0000 mg | ORAL_TABLET | Freq: Every day | ORAL | Status: DC
  Administered 2022-07-19 – 2022-07-21 (×3): 5 mg via ORAL
  Filled 2022-07-19 (×5): qty 1

## 2022-07-19 MED ORDER — PANTOPRAZOLE SODIUM 40 MG PO TBEC
40.0000 mg | DELAYED_RELEASE_TABLET | Freq: Every day | ORAL | Status: DC
  Administered 2022-07-19 – 2022-07-21 (×3): 40 mg via ORAL
  Filled 2022-07-19 (×4): qty 1

## 2022-07-19 MED ORDER — ACETAMINOPHEN 650 MG RE SUPP: 650.0000 mg | Freq: Four times a day (QID) | RECTAL | Status: DC | PRN

## 2022-07-19 MED ORDER — DAPAGLIFLOZIN PROPANEDIOL 10 MG PO TABS
10.0000 mg | ORAL_TABLET | Freq: Every day | ORAL | Status: DC
  Administered 2022-07-19 – 2022-07-20 (×2): 10 mg via ORAL
  Filled 2022-07-19 (×2): qty 1

## 2022-07-19 MED ORDER — ACETAMINOPHEN 325 MG PO TABS: 650.0000 mg | ORAL_TABLET | Freq: Four times a day (QID) | ORAL | Status: DC | PRN

## 2022-07-19 MED ORDER — PREGABALIN 75 MG PO CAPS
75.0000 mg | ORAL_CAPSULE | Freq: Every day | ORAL | Status: DC
  Administered 2022-07-19 – 2022-07-22 (×4): 75 mg via ORAL
  Filled 2022-07-19 (×4): qty 1

## 2022-07-19 MED ORDER — POTASSIUM CHLORIDE CRYS ER 20 MEQ PO TBCR
40.0000 meq | EXTENDED_RELEASE_TABLET | Freq: Once | ORAL | Status: AC
  Administered 2022-07-19: 40 meq via ORAL
  Filled 2022-07-19: qty 2

## 2022-07-19 MED ORDER — SODIUM CHLORIDE 0.9 % IV BOLUS: 250.0000 mL | Freq: Once | INTRAVENOUS | Status: DC

## 2022-07-19 MED ORDER — ONDANSETRON HCL 4 MG/2ML IJ SOLN: 4.0000 mg | Freq: Four times a day (QID) | INTRAMUSCULAR | Status: DC | PRN

## 2022-07-19 MED ORDER — CLOPIDOGREL BISULFATE 75 MG PO TABS
75.0000 mg | ORAL_TABLET | ORAL | Status: DC
  Administered 2022-07-20 – 2022-07-22 (×2): 75 mg via ORAL
  Filled 2022-07-19 (×2): qty 1

## 2022-07-19 MED ORDER — SERTRALINE HCL 50 MG PO TABS
50.0000 mg | ORAL_TABLET | Freq: Every day | ORAL | Status: DC
  Administered 2022-07-19 – 2022-07-22 (×4): 50 mg via ORAL
  Filled 2022-07-19 (×4): qty 1

## 2022-07-19 NOTE — Assessment & Plan Note (Signed)
Continue sertraline and amitriptyline. 

## 2022-07-19 NOTE — Consult Note (Signed)
[facility]  CARDIOLOGY CONSULT NOTE  Patient ID: Rebecca Gilmore MRN: [id number] DOB/AGE: [birth year] 82 y.o.  Admit date: 07/19/2022 Referring Physician Myriam Forehand Primary Physician [facility] Primary Cardiologist Wilford Merryfield Reason for Consultation acute on chronic diastolic congestive heart failure  HPI: 82 year old female referred for evaluation of acute on chronic diastolic congestive heart failure (HFpEF).  The patient has known history of coronary artery disease, moderate aortic stenosis, chronic diastolic congestive heart failure (HFpEF).  She presents with several week history of exertional dyspnea, edema and weight gain.  She was seen as an outpatient at the congestive heart failure clinic on 07/15/2022 at which time she was continued on torsemide 80 mg twice daily and started on metolazone 5 mg daily without significant benefit.  Presents to [facility] emergency room chest x-ray revealed interstitial lung markings consistent with pulmonary edema.  Admission labs notable for AKI with underlying CKD with BUN and creatinine 37 and 2.15, respectively.  BNP was 209.3.  Hemoglobin hematocrit were 8.0 and 27.2, respectively.  The patient was treated with Bumex 1 mg IV with initial diuresis and overall clinical improvement.  She was started on furosemide drip.  The patient has a history of NSTEMI, underwent cardiac catheterization at [facility] 03/06/2021 which revealed 80% stenosis ostial RCA, diffuse stenosis mid to distal RCA, 50-60% stenosis proximal/mid LAD, treated medically.  D echocardiogram 11/17/2021 revealed LVEF 65-70% with moderate aortic stenosis (AVA 0.98 cm, peak velocity 3.63 m/s, peak gradient 52.8 mmHg, mean gradient 26.5 mmHg.  She has known peripheral vascular disease status post left ileal endarterectomy and thrombectomy 11/16/2021.  History of upper GI bleed with chronic anemia.  She has chronic kidney disease stage IIIb.  Review of systems complete and found to be negative unless listed above      Past Medical History:  Diagnosis Date   Anemia    Arrhythmia    atrial fibrillation   CHF (congestive heart failure) (HCC)    Chronic kidney disease    Depression    HBP (high blood pressure)    History of bladder problems    Memory loss    Muscle pain    Osteoporosis    Reflux    Sinus congestion    Swelling    B/L FEET AND LEGS    Past Surgical History:  Procedure Laterality Date   ARTERY BIOPSY Bilateral 12/01/2016   Procedure: BIOPSY TEMPORAL ARTERY;  Surgeon: Fransisco Hertz, MD;  Location: [facility] OR;  Service: Vascular;  Laterality: Bilateral;   CATARACT SURGERY     COLONOSCOPY WITH PROPOFOL N/A 12/13/2021   Procedure: COLONOSCOPY WITH PROPOFOL;  Surgeon: Wyline Mood, MD;  Location: [facility] ENDOSCOPY;  Service: Gastroenterology;  Laterality: N/A;   ESOPHAGOGASTRODUODENOSCOPY (EGD) WITH PROPOFOL N/A 12/12/2021   Procedure: ESOPHAGOGASTRODUODENOSCOPY (EGD) WITH PROPOFOL;  Surgeon: Jaynie Collins, DO;  Location: [facility] ENDOSCOPY;  Service: Gastroenterology;  Laterality: N/A;   EYELID SURGERY Bilateral    GIVENS CAPSULE STUDY N/A 12/15/2021   Procedure: GIVENS CAPSULE STUDY;  Surgeon: Jaynie Collins, DO;  Location: [facility] ENDOSCOPY;  Service: Gastroenterology;  Laterality: N/A;   GIVENS CAPSULE STUDY N/A 01/28/2022   Procedure: GIVENS CAPSULE STUDY;  Surgeon: Toney Reil, MD;  Location: [facility] ENDOSCOPY;  Service: Gastroenterology;  Laterality: N/A;   GROWTH ON FACE     KNEE SURGERY Right    THROMBECTOMY FEMORAL ARTERY Left 11/16/2021   Procedure: THROMBECTOMY FEMORAL ARTERY;  Surgeon: Fransisco Hertz, MD;  Location: [facility] ORS;  Service: Vascular;  Laterality: Left;    (Not  in a hospital admission)  Social History   Socioeconomic History   Marital status: Married    Spouse name: Not on file   Number of children: Not on file   Years of education: Not on file   Highest education level: Not on file  Occupational History   Not on file  Tobacco Use   Smoking  status: Former    Types: Cigarettes    Quit date: 01/12/1993    Years since quitting: 29.5   Smokeless tobacco: Former    Types: Snuff, Chew   Tobacco comments:    DATE QUIT UNKNOWN  Vaping Use   Vaping Use: Never used  Substance and Sexual Activity   Alcohol use: No   Drug use: No   Sexual activity: Not on file  Other Topics Concern   Not on file  Social History Narrative   Not on file   Social Determinants of Health   Financial Resource Strain: Not on file  Food Insecurity: No Food Insecurity (02/18/2022)   Hunger Vital Sign    Worried About Running Out of Food in the Last Year: Never true    Ran Out of Food in the Last Year: Never true  Transportation Needs: No Transportation Needs (02/18/2022)   PRAPARE - Administrator, Civil Service (Medical): No    Lack of Transportation (Non-Medical): No  Physical Activity: Not on file  Stress: Not on file  Social Connections: Not on file  Intimate Partner Violence: Not At Risk (02/18/2022)   Humiliation, Afraid, Rape, and Kick questionnaire    Fear of Current or Ex-Partner: No    Emotionally Abused: No    Physically Abused: No    Sexually Abused: No    Family History  Problem Relation Age of Onset   Heart disease Mother    AAA (abdominal aortic aneurysm) Mother    Heart disease Father       Review of systems complete and found to be negative unless listed above      PHYSICAL EXAM  General: Well developed, well nourished, in no acute distress HEENT:  Normocephalic and atramatic Neck:  No JVD.  Lungs: Clear bilaterally to auscultation and percussion. Heart: HRRR . Normal S1 and S2 without gallops or murmurs.  Abdomen: Bowel sounds are positive, abdomen soft and non-tender  Msk:  Back normal, normal gait. Normal strength and tone for age. Extremities: No clubbing, cyanosis or edema.   Neuro: Alert and oriented X 3. Psych:  Good affect, responds appropriately  Labs:   Lab Results  Component Value  Date   WBC 5.3 07/19/2022   HGB 7.4 (L) 07/19/2022   HCT 25.3 (L) 07/19/2022   MCV 96.6 07/19/2022   PLT 264 07/19/2022    Recent Labs  Lab 07/18/22 2203 07/19/22 0443  NA 137  --   K 3.3* 3.4*  CL 99  --   CO2 30  --   BUN 37*  --   CREATININE 2.15* 2.14*  CALCIUM 9.1  --   GLUCOSE 117*  --    No results found for: "CKTOTAL", "CKMB", "CKMBINDEX", "TROPONINI"  Lab Results  Component Value Date   CHOL 106 07/15/2022   CHOL 111 12/12/2021   Lab Results  Component Value Date   HDL 50 07/15/2022   HDL 41 12/12/2021   Lab Results  Component Value Date   LDLCALC 36 07/15/2022   LDLCALC 52 12/12/2021   Lab Results  Component Value Date   TRIG 101 07/15/2022  TRIG 88 12/12/2021   Lab Results  Component Value Date   CHOLHDL 2.1 07/15/2022   CHOLHDL 2.7 12/12/2021   No results found for: "LDLDIRECT"    Radiology: DG Chest 2 View  Result Date: 07/18/2022 CLINICAL DATA:  Bilateral leg swelling. EXAM: CHEST - 2 VIEW COMPARISON:  February 18, 2022 FINDINGS: There is stable mild to moderate severity enlargement of the cardiac silhouette. Marked severity calcification of the aortic arch is seen. Low lung volumes are noted with stable elevation of the right hemidiaphragm. Mild, diffuse, chronic appearing increased interstitial lung markings are present. There is no evidence of focal consolidation, pleural effusion or pneumothorax. Multilevel degenerative changes are seen throughout the thoracic spine. IMPRESSION: Chronic appearing increased interstitial lung markings without evidence of acute or active cardiopulmonary disease. Electronically Signed   By: Aram Candela M.D.   On: 07/18/2022 23:46    EKG: Atrial fibrillation at 53 bpm  ASSESSMENT AND PLAN:   1.  Acute on chronic HFpEF, with several week history of worsening exertional dyspnea, peripheral edema and weight gain, fracture to outpatient diuretics (torsemide 80 mg twice daily and metolazone 5 mg daily), with good  initial diuresis after Bumex 1 mg IV, now on furosemide drip 2.  CAD, status post NSTEMI, cardiac catheterization at [facility] 02/04/2021 which revealed 80% stenosis ostial RCA with diffuse mid to distal disease, 50-60% stenosis proximal and mid LAD, treated medically, currently without chest pain, troponin 26, ECG nondiagnostic 3.  Moderate aortic stenosis, 2D echocardiogram 11/17/2021 revealed LVEF 65-70% with calculated aortic valve area 0.98 cm, peak velocity 3.63 m/s, peak gradient 52.8 mmHg, mean gradient 26.5 mmHg 4.  AKI/CKD 3b, BUN and creatinine 37 and 2.15, respectively 5.  Chronic atrial fibrillation, rate controlled, not on chronic anticoagulation, with history of upper GI bleed and chronic anemia 6.  Peripheral vascular disease, status post ileal endarterectomy and thrombectomy 11/16/2021 on clopidogrel  Recommendations  1.  Agree with current therapy 2.  Continue diuresis with furosemide drip 3.  Carefully monitor renal status 4.  Good medical management limited by bradycardia, low blood pressure, CKD 3b 5.  May require outpatient evaluation with right and left heart catheterization to evaluate need for TAVR    Signed: Marcina Millard MD,PhD, [facility] 07/19/2022, 12:16 PM

## 2022-07-19 NOTE — ED Notes (Signed)
Pt with family at bedside. Pt alert and interacting with visitors

## 2022-07-19 NOTE — Assessment & Plan Note (Signed)
Not on anticoagulation, likely related to history of GI bleed Not on rate control agents

## 2022-07-19 NOTE — Assessment & Plan Note (Signed)
Elevated troponin Suspect elevated troponin secondary to demand ischemia.  Patient denies chest pain Continue rosuvastatin

## 2022-07-19 NOTE — Assessment & Plan Note (Signed)
History of recurrent GI bleed requiring transfusions Anemia of CKD Negative bone marrow biopsy 02/2022 Hemoglobin at baseline at 8 [facility] outpatient No reports of GI bleeding Has had extensive GI workup with EGD, colonoscopy and capsule endoscopy Continue to monitor

## 2022-07-19 NOTE — H&P (Addendum)
History and Physical    Patient: Rebecca Gilmore NFA:[id number] DOB: 1941/01/05 DOA: 07/19/2022 DOS: the patient was seen and examined on 07/19/2022 PCP: Ailene Ravel, MD  Patient coming from: Home  Chief Complaint:  Chief Complaint  Patient presents with   Leg Swelling    HPI: Rebecca Gilmore is a 82 y.o. 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  Patient was seen by her cardiologist on 5/8 with a similar complaint and her torsemide was increased to 80 mg twice daily and metolazone was added.  It was felt that her aortic stenosis might be contributing to her worsening symptoms and she has an upcoming repeat echo.  She states that and spite of the medication adjustment and compliance with GDMT, she continues to retain fluid and have dyspnea on exertion.  She denies chest pain, cough fever or chills. ED course and data review: Soft blood pressures, down to 90/44 after diuretic and mildly tachycardic at 105 on arrival.  Labs with troponin of 26 and BNP 206.  Potassium 3.3, creatinine 2.15, up from baseline of 1.68.  Hemoglobin 8.0 improved from most recent of 7.3. EKG pending Chest x-ray shows chronic appearing interstitial lung markings without evidence of active cardiopulmonary disease. Patient received IV Bumex 1 mg and oral potassium repletion. Hospitalist consulted for admission.   Review of Systems: As mentioned in the history of present illness. All other systems reviewed and are negative.  Past Medical History:  Diagnosis Date   Anemia    Arrhythmia    atrial fibrillation   CHF  (congestive heart failure) (HCC)    Chronic kidney disease    Depression    HBP (high blood pressure)    History of bladder problems    Memory loss    Muscle pain    Osteoporosis    Reflux    Sinus congestion    Swelling    B/L FEET AND LEGS   Past Surgical History:  Procedure Laterality Date   ARTERY BIOPSY Bilateral 12/01/2016   Procedure: BIOPSY TEMPORAL ARTERY;  Surgeon: Fransisco Hertz, MD;  Location: [facility] OR;  Service: Vascular;  Laterality: Bilateral;   CATARACT SURGERY     COLONOSCOPY WITH PROPOFOL N/A 12/13/2021   Procedure: COLONOSCOPY WITH PROPOFOL;  Surgeon: Wyline Mood, MD;  Location: [facility] ENDOSCOPY;  Service: Gastroenterology;  Laterality: N/A;   ESOPHAGOGASTRODUODENOSCOPY (EGD) WITH PROPOFOL N/A 12/12/2021   Procedure: ESOPHAGOGASTRODUODENOSCOPY (EGD) WITH PROPOFOL;  Surgeon: Jaynie Collins, DO;  Location: [facility] ENDOSCOPY;  Service: Gastroenterology;  Laterality: N/A;   EYELID SURGERY Bilateral    GIVENS CAPSULE STUDY N/A 12/15/2021   Procedure: GIVENS CAPSULE STUDY;  Surgeon: Jaynie Collins, DO;  Location: [facility] ENDOSCOPY;  Service: Gastroenterology;  Laterality: N/A;   GIVENS CAPSULE STUDY N/A 01/28/2022   Procedure: GIVENS CAPSULE STUDY;  Surgeon: Toney Reil, MD;  Location: [facility] ENDOSCOPY;  Service: Gastroenterology;  Laterality: N/A;   GROWTH ON FACE     KNEE SURGERY Right    THROMBECTOMY FEMORAL ARTERY Left 11/16/2021   Procedure: THROMBECTOMY FEMORAL ARTERY;  Surgeon: Fransisco Hertz, MD;  Location: [facility] ORS;  Service: Vascular;  Laterality: Left;  Social History:  reports that she quit smoking about 29 years ago. Her smoking use included cigarettes. She has quit using smokeless tobacco.  Her smokeless tobacco use included snuff and chew. She reports that she does not drink alcohol and does not use drugs.  Allergies  Allergen Reactions   Morphine Hives and Itching    Other Reaction(s): Confusion   Other Hives, Itching and Other (See Comments)     Other Reaction(s): Confusion   Morphine And Related Itching   Kenalog [Triamcinolone Acetonide] Other (See Comments)    FLUSH IN FACE   Penicillins Other (See Comments)    Has patient had a PCN reaction causing immediate rash, facial/tongue/throat swelling, SOB or lightheadedness with hypotension: Unknown Has patient had a PCN reaction causing severe rash involving mucus membranes or skin necrosis: Unknown Has patient had a PCN reaction that required hospitalization:No Has patient had a PCN reaction occurring within the last 10 years: No If all of the above answers are "NO", then may proceed with Cephalosporin use.    Latex Rash   Povidone-Iodine Rash    burning    Family History  Problem Relation Age of Onset   Heart disease Mother    AAA (abdominal aortic aneurysm) Mother    Heart disease Father     Prior to Admission medications   Medication Sig Start Date End Date Taking? Authorizing Provider  acetaminophen (TYLENOL) 325 MG tablet Take 2 tablets (650 mg total) by mouth every 6 (six) hours as needed for mild pain or fever. 12/15/21   Sunnie Nielsen, DO  amitriptyline (ELAVIL) 25 MG tablet Take 25 mg by mouth at bedtime. 07/06/21   [provider]  cholecalciferol (VITAMIN D3) 25 MCG (1000 UT) tablet Take 1,000 Units by mouth daily.    [provider]  clopidogrel (PLAVIX) 75 MG tablet Take by mouth.    [provider]  cyanocobalamin (VITAMIN B12) 1000 MCG tablet Take 1,000 mcg by mouth.    [provider]  dapagliflozin propanediol (FARXIGA) 10 MG TABS tablet Take 1 tablet (10 mg total) by mouth daily. 07/15/22   Laurey Morale, MD  Ferrous Sulfate (IRON) 325 (65 Fe) MG TABS Take 1 tablet by mouth daily. 09/07/21   [provider]  loratadine (CLARITIN) 10 MG tablet Take 10 mg by mouth daily as needed.    [provider]  metolazone (ZAROXOLYN) 2.5 MG tablet Take 1 tablet (2.5 mg total) by mouth daily. 07/15/22 10/13/22  Laurey Morale, MD  pantoprazole (PROTONIX) 40 MG tablet Take 40 mg by mouth every morning. 10/01/21   [provider]  potassium chloride SA (KLOR-CON M) 20 MEQ tablet Take 3 tablets (60 mEq total) by mouth daily. Take an extra 2 tablets on your metolazone days. 07/16/22   Laurey Morale, MD  pregabalin (LYRICA) 75 MG capsule Take 1 capsule (75 mg total) by mouth daily. 11/25/21   Georgiana Spinner, NP  rosuvastatin (CRESTOR) 5 MG tablet Take 5 mg by mouth daily. 04/06/19   [provider]  sertraline (ZOLOFT) 50 MG tablet Take 25 mg by mouth daily. 10/14/17   [provider]  torsemide (DEMADEX) 20 MG tablet Take 4 tablets (80 mg total) by mouth 2 (two) times daily. 07/15/22 10/13/22  Laurey Morale, MD    Physical Exam: Vitals:   07/18/22 2136 07/19/22 0221  BP: (!) 129/49 (!) 90/44  Pulse: (!) 105 (!) 52  Resp: 18 16  Temp: 97.7 F (36.5 C)  98 F (36.7 C)  TempSrc: Oral Oral  SpO2: 95% 98%  Weight:  104.8 kg  Height:  5\' 4"  (1.626 m)   Physical Exam Vitals and nursing note reviewed.  Constitutional:      General: She is not in acute distress. HENT:     Head: Normocephalic and atraumatic.  Cardiovascular:     Rate and Rhythm: Normal rate and regular rhythm.     Heart sounds: Normal heart sounds.  Pulmonary:     Effort: Pulmonary effort is normal.     Breath sounds: Normal breath sounds.  Abdominal:     Palpations: Abdomen is soft.     Tenderness: There is no abdominal tenderness.  Musculoskeletal:     Right lower leg: Edema present.     Left lower leg: Edema present.  Neurological:     Mental Status: Mental status is at baseline.     Labs on Admission: I have personally reviewed following labs and imaging studies  CBC: Recent Labs  Lab 07/14/22 1259 07/18/22 2203  WBC  --  6.3  HGB 7.3* 8.0*  HCT 24.1* 27.2*  MCV  --  95.4  PLT  --  300   Basic Metabolic Panel: Recent Labs  Lab 07/15/22 1648 07/18/22 2203  NA 138 137  K 3.1* 3.3*  CL 99  99  CO2 29 30  GLUCOSE 113* 117*  BUN 28* 37*  CREATININE 1.68* 2.15*  CALCIUM 9.0 9.1   GFR: Estimated Creatinine Clearance: 23.8 mL/min (A) (by C-G formula based on SCr of 2.15 mg/dL (H)). Liver Function Tests: No results for input(s): "AST", "ALT", "ALKPHOS", "BILITOT", "PROT", "ALBUMIN" in the last 168 hours. No results for input(s): "LIPASE", "AMYLASE" in the last 168 hours. No results for input(s): "AMMONIA" in the last 168 hours. Coagulation Profile: No results for input(s): "INR", "PROTIME" in the last 168 hours. Cardiac Enzymes: No results for input(s): "CKTOTAL", "CKMB", "CKMBINDEX", "TROPONINI" in the last 168 hours. BNP (last 3 results) No results for input(s): "PROBNP" in the last 8760 hours. HbA1C: No results for input(s): "HGBA1C" in the last 72 hours. CBG: No results for input(s): "GLUCAP" in the last 168 hours. Lipid Profile: No results for input(s): "CHOL", "HDL", "LDLCALC", "TRIG", "CHOLHDL", "LDLDIRECT" in the last 72 hours. Thyroid Function Tests: No results for input(s): "TSH", "T4TOTAL", "FREET4", "T3FREE", "THYROIDAB" in the last 72 hours. Anemia Panel: No results for input(s): "VITAMINB12", "FOLATE", "FERRITIN", "TIBC", "IRON", "RETICCTPCT" in the last 72 hours. Urine analysis:    Component Value Date/Time   COLORURINE YELLOW (A) 11/19/2021 1223   APPEARANCEUR CLOUDY (A) 11/19/2021 1223   LABSPEC 1.015 11/19/2021 1223   PHURINE 5.0 11/19/2021 1223   GLUCOSEU NEGATIVE 11/19/2021 1223   HGBUR SMALL (A) 11/19/2021 1223   BILIRUBINUR NEGATIVE 11/19/2021 1223   KETONESUR NEGATIVE 11/19/2021 1223   PROTEINUR 30 (A) 11/19/2021 1223   NITRITE NEGATIVE 11/19/2021 1223   LEUKOCYTESUR LARGE (A) 11/19/2021 1223    Radiological Exams on Admission: DG Chest 2 View  Result Date: 07/18/2022 CLINICAL DATA:  Bilateral leg swelling. EXAM: CHEST - 2 VIEW COMPARISON:  February 18, 2022 FINDINGS: There is stable mild to moderate severity enlargement of the cardiac  silhouette. Marked severity calcification of the aortic arch is seen. Low lung volumes are noted with stable elevation of the right hemidiaphragm. Mild, diffuse, chronic appearing increased interstitial lung markings are present. There is no evidence of focal consolidation, pleural effusion or pneumothorax. Multilevel degenerative changes are seen throughout the thoracic spine. IMPRESSION: Chronic  appearing increased interstitial lung markings without evidence of acute or active cardiopulmonary disease. Electronically Signed   By: Aram Candela M.D.   On: 07/18/2022 23:46     Data Reviewed: Relevant notes from primary care and specialist visits, past discharge summaries as available in EHR, including Care Everywhere. Prior diagnostic testing as pertinent to current admission diagnoses Updated medications and problem lists for reconciliation ED course, including vitals, labs, imaging, treatment and response to treatment Triage notes, nursing and pharmacy notes and ED provider's notes Notable results as noted in HPI   Assessment and Plan: * Acute on chronic diastolic (congestive) heart failure (HCC) Aortic stenosis Symptomatic anemia Possibly multifactorial and related to aortic stenosis, symptomatic anemia At improving with increase in home torsemide and metolazone Lasix drip due to soft blood pressure Continue recently started [place].  Will hold metolazone Daily weights with intake and output monitoring Echocardiogram Cardiology consulted.  Per recent visit on 5/8:"If AS is severe, she will need RHC/LHC using minimal contrast and evaluation for TAVR"   Hypotension Patient with soft Bps Low concern for cardiogenic/circulatory shock . Mentating well Avoiding aggressive diuresis due to AS. Will give lasix drip Cardiology consulted  Coronary artery disease Elevated troponin Suspect elevated troponin secondary to demand ischemia.  Patient denies chest pain Continue  rosuvastatin  Acute renal failure superimposed on stage 3b chronic kidney disease (HCC) Creatinine 2.15 up from baseline of 1.68 likely related to intensification of diuretic regimen Continue to monitor  Iron deficiency anemia History of recurrent GI bleed requiring transfusions Anemia of CKD Negative bone marrow biopsy 02/2022 Hemoglobin at baseline at 8 Receives Venofer outpatient No reports of GI bleeding Has had extensive GI workup with EGD, colonoscopy and capsule endoscopy Continue to monitor  Atrial fibrillation, chronic (HCC) Not on anticoagulation, likely related to history of GI bleed Not on rate control agents  Hypokalemia Received oral repletion in the ED for potassium of 3.3 Continue to monitor  Depression Continue sertraline and amitriptyline  Morbid obesity with BMI of 40.0-44.9, adult (HCC) Complicating factor to overall prognosis and care        DVT prophylaxis: Lovenox  Consults: [facility] cardiology, Dr. Mariah Milling  Advance Care Planning:   Code Status: Prior   Family Communication: none  Disposition Plan: Back to previous home environment  Severity of Illness: The appropriate patient status for this patient is INPATIENT. Inpatient status is judged to be reasonable and necessary in order to provide the required intensity of service to ensure the patient's safety. The patient's presenting symptoms, physical exam findings, and initial radiographic and laboratory data in the context of their chronic comorbidities is felt to place them at high risk for further clinical deterioration. Furthermore, it is not anticipated that the patient will be medically stable for discharge from the hospital within 2 midnights of admission.   * I certify that at the point of admission it is my clinical judgment that the patient will require inpatient hospital care spanning beyond 2 midnights from the point of admission due to high intensity of service, high risk for further  deterioration and high frequency of surveillance required.*  Author: Andris Baumann, MD 07/19/2022 4:20 AM  For on call review [email or web address].

## 2022-07-19 NOTE — Assessment & Plan Note (Signed)
Creatinine 2.15 up from baseline of 1.68 likely related to intensification of diuretic regimen Continue to monitor

## 2022-07-19 NOTE — ED Provider Notes (Signed)
[facility] Provider Note    Event Date/Time   First MD Initiated Contact with Patient 07/19/22 [phone]     (approximate)   History   Leg Swelling   HPI  Rebecca Gilmore is a 82 y.o. female who presents to the ED from home with a chief complaint of bilateral leg swelling.  Patient saw her cardiologist 2 days ago, torsemide was increased, metolazone was added.  Despite this, she continues to experience BLE swelling to the point that she cannot ambulate today.  Reports dyspnea on exertion associated with chest pain.  Denies fever/chills, cough, abdominal pain, nausea, vomiting or dizziness.     Past Medical History   Past Medical History:  Diagnosis Date   Anemia    Arrhythmia    atrial fibrillation   CHF (congestive heart failure) (HCC)    Chronic kidney disease    Depression    HBP (high blood pressure)    History of bladder problems    Memory loss    Muscle pain    Osteoporosis    Reflux    Sinus congestion    Swelling    B/L FEET AND LEGS     Active Problem List   Patient Active Problem List   Diagnosis Date Noted   History of recurrent GI bleed 07/19/2022   Anemia of chronic renal failure, stage 3b (HCC) 07/19/2022   Coronary artery disease 07/19/2022   Elevated troponin 07/19/2022   Hypotension 07/19/2022   Anemia of chronic kidney failure, stage 4 (severe) (HCC) 02/20/2022   Symptomatic anemia 02/18/2022   Morbid obesity with BMI of 40.0-44.9, adult (HCC) 02/18/2022   Acute on chronic diastolic (congestive) heart failure (HCC) 02/18/2022   Anemia, unspecified 02/18/2022   Chronic kidney disease, stage 3b (HCC) 01/27/2022   Hypokalemia 01/27/2022   Acute on chronic blood loss anemia 01/27/2022   CKD (chronic kidney disease) stage 4, GFR 15-29 ml/min (HCC) 12/23/2021   Anemia    GI bleeding 12/11/2021   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   Acute blood loss anemia 12/11/2021   Depression    HLD (hyperlipidemia)    Iron  deficiency anemia    Chronic diastolic CHF (congestive heart failure) (HCC)    Acute renal failure superimposed on stage 3b chronic kidney disease (HCC)    Myocardial injury    Atrial fibrillation, chronic (HCC)    Femoral artery occlusion, 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   Atrial fibrillation and flutter (HCC) 04/18/2021   Acute heart failure (HCC) 03/05/2021   Acute respiratory failure with hypoxia (HCC) 03/05/2021   Influenza A 03/05/2021   NSTEMI (non-ST elevated myocardial infarction) (HCC) 03/05/2021   Bradycardia 06/27/2019   Peripheral edema 01/18/2018   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   SOB (shortness of breath) on exertion 09/21/2017   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     Past Surgical History   Past Surgical History:  Procedure Laterality Date   ARTERY BIOPSY Bilateral 12/01/2016   Procedure: BIOPSY TEMPORAL ARTERY;  Surgeon: Fransisco Hertz, MD;  Location: [facility] OR;  Service: Vascular;  Laterality: Bilateral;   CATARACT SURGERY     COLONOSCOPY WITH PROPOFOL N/A 12/13/2021   Procedure: COLONOSCOPY WITH PROPOFOL;  Surgeon: Wyline Mood, MD;  Location: [facility] ENDOSCOPY;  Service: Gastroenterology;  Laterality: N/A;   ESOPHAGOGASTRODUODENOSCOPY (EGD) WITH PROPOFOL N/A 12/12/2021   Procedure: ESOPHAGOGASTRODUODENOSCOPY (EGD) WITH PROPOFOL;  Surgeon: Jaynie Collins, DO;  Location: [facility] ENDOSCOPY;  Service: Gastroenterology;  Laterality: N/A;   EYELID SURGERY Bilateral  GIVENS CAPSULE STUDY N/A 12/15/2021   Procedure: GIVENS CAPSULE STUDY;  Surgeon: Jaynie Collins, DO;  Location: [facility] ENDOSCOPY;  Service: Gastroenterology;  Laterality: N/A;   GIVENS CAPSULE STUDY N/A 01/28/2022   Procedure: GIVENS CAPSULE STUDY;  Surgeon: Toney Reil, MD;  Location: [facility] ENDOSCOPY;  Service: Gastroenterology;  Laterality: N/A;   GROWTH ON FACE     KNEE SURGERY Right    THROMBECTOMY FEMORAL ARTERY Left 11/16/2021   Procedure: THROMBECTOMY FEMORAL ARTERY;  Surgeon: Fransisco Hertz, MD;  Location: [facility] ORS;  Service: Vascular;  Laterality: Left;     Home Medications   Prior to Admission medications   Medication Sig Start Date End Date Taking? Authorizing Provider  acetaminophen (TYLENOL) 325 MG tablet Take 2 tablets (650 mg total) by mouth every 6 (six) hours as needed for mild pain or fever. 12/15/21  Yes Sunnie Nielsen, DO  amitriptyline (ELAVIL) 25 MG tablet Take 25 mg by mouth at bedtime as needed. 07/06/21  Yes [provider]  cholecalciferol (VITAMIN D3) 25 MCG (1000 UT) tablet Take 1,000 Units by mouth daily.   Yes [provider]  clopidogrel (PLAVIX) 75 MG tablet Take 75 mg by mouth every other day.   Yes [provider]  cyanocobalamin (VITAMIN B12) 1000 MCG tablet Take 1,000 mcg by mouth daily.   Yes [provider]  dapagliflozin propanediol (FARXIGA) 10 MG TABS tablet Take 1 tablet (10 mg total) by mouth daily. 07/15/22  Yes Laurey Morale, MD  Ferrous Sulfate (IRON) 325 (65 Fe) MG TABS Take 1 tablet by mouth daily. 09/07/21  Yes [provider]  loratadine (CLARITIN) 10 MG tablet Take 10 mg by mouth daily as needed.   Yes [provider]  metolazone (ZAROXOLYN) 2.5 MG tablet Take 1 tablet (2.5 mg total) by mouth daily. 07/15/22 10/13/22 Yes Laurey Morale, MD  pantoprazole (PROTONIX) 40 MG tablet Take 40 mg by mouth at bedtime. 10/01/21  Yes [provider]  potassium chloride SA (KLOR-CON M) 20 MEQ tablet Take 3 tablets (60 mEq total) by mouth daily. Take an extra 2 tablets on your metolazone days. 07/16/22  Yes Laurey Morale, MD  pregabalin (LYRICA) 75 MG capsule Take 1 capsule (75 mg total) by mouth daily. 11/25/21  Yes Georgiana Spinner, NP  rosuvastatin (CRESTOR) 5 MG tablet Take 5 mg by mouth at bedtime. 04/06/19  Yes [provider]  sertraline (ZOLOFT) 50 MG tablet Take 50 mg by mouth daily. 10/14/17  Yes [provider]  torsemide (DEMADEX) 20 MG tablet Take 4 tablets  (80 mg total) by mouth 2 (two) times daily. 07/15/22 10/13/22 Yes Laurey Morale, MD     Allergies  Morphine, Other, Morphine and related, Kenalog [triamcinolone acetonide], Penicillins, Latex, and Povidone-iodine   Family History   Family History  Problem Relation Age of Onset   Heart disease Mother    AAA (abdominal aortic aneurysm) Mother    Heart disease Father      Physical Exam  Triage Vital Signs: ED Triage Vitals  Enc Vitals Group     BP 07/18/22 2136 (!) 129/49     Pulse Rate 07/18/22 2136 (!) 105     Resp 07/18/22 2136 18     Temp 07/18/22 2136 97.7 F (36.5 C)     Temp Source 07/18/22 2136 Oral     SpO2 07/18/22 2136 95 %     Weight 07/19/22 0221 231 lb (104.8 kg)     Height  07/19/22 0221 5\' 4"  (1.626 m)     Head Circumference --      Peak Flow --      Pain Score 07/18/22 2133 8     Pain Loc --      Pain Edu? --      Excl. in GC? --     Updated Vital Signs: BP (!) 90/44 (BP Location: Left Arm)   Pulse (!) 52   Temp 98 F (36.7 C) (Oral)   Resp 16   Ht 5\' 4"  (1.626 m)   Wt 104.8 kg   SpO2 98%   BMI 39.65 kg/m    General: Awake, mild distress.  CV:  Bradycardia, irregular rhythm.  Good peripheral perfusion.  Resp:  Increased effort.  Faint bibasilar rales. Abd:  Obese, nontender.  No distention.  Other:  BLE lymphedema with 2+ pitting edema.   ED Results / Procedures / Treatments  Labs (all labs ordered are listed, but only abnormal results are displayed) Labs Reviewed  CBC - Abnormal; Notable for the following components:      Result Value   RBC 2.85 (*)    Hemoglobin 8.0 (*)    HCT 27.2 (*)    MCHC 29.4 (*)    All other components within normal limits  BRAIN NATRIURETIC PEPTIDE - Abnormal; Notable for the following components:   B Natriuretic Peptide 209.3 (*)    All other components within normal limits  BASIC METABOLIC PANEL - Abnormal; Notable for the following components:   Potassium 3.3 (*)    Glucose, Bld 117 (*)    BUN 37 (*)     Creatinine, Ser 2.15 (*)    GFR, Estimated 22 (*)    All other components within normal limits  TROPONIN I (HIGH SENSITIVITY) - Abnormal; Notable for the following components:   Troponin I (High Sensitivity) 26 (*)    All other components within normal limits  CBC  CREATININE, SERUM     EKG  ED ECG REPORT I, Kahlia Lagunes J, the attending physician, personally viewed and interpreted this ECG.   Date: 07/19/2022  EKG Time: 0415  Rate: 50  Rhythm: atrial fibrillation, rate 50  Axis: Normal  Intervals:right bundle branch block  ST&T Change: Nonspecific    RADIOLOGY I have independently visualized and interpreted patient's chest x-ray as well as noted the radiology interpretation:  Chest x-ray: Chronic appearing increased interstitial lung markings  Official radiology report(s): DG Chest 2 View  Result Date: 07/18/2022 CLINICAL DATA:  Bilateral leg swelling. EXAM: CHEST - 2 VIEW COMPARISON:  February 18, 2022 FINDINGS: There is stable mild to moderate severity enlargement of the cardiac silhouette. Marked severity calcification of the aortic arch is seen. Low lung volumes are noted with stable elevation of the right hemidiaphragm. Mild, diffuse, chronic appearing increased interstitial lung markings are present. There is no evidence of focal consolidation, pleural effusion or pneumothorax. Multilevel degenerative changes are seen throughout the thoracic spine. IMPRESSION: Chronic appearing increased interstitial lung markings without evidence of acute or active cardiopulmonary disease. Electronically Signed   By: Aram Candela M.D.   On: 07/18/2022 23:46     PROCEDURES:  Critical Care performed: No  .1-3 Lead EKG Interpretation  Performed by: Irean Hong, MD Authorized by: Irean Hong, MD     Interpretation: abnormal     ECG rate:  54   ECG rate assessment: bradycardic     Rhythm: sinus bradycardia     Ectopy: none     Conduction:  normal   Comments:     Patient  placed on cardiac monitor to evaluate for arrhythmias    MEDICATIONS ORDERED IN ED: Medications  bumetanide (BUMEX) injection 1 mg (has no administration in time range)  potassium chloride SA (KLOR-CON M) CR tablet 40 mEq (has no administration in time range)  acetaminophen (TYLENOL) tablet 650 mg (has no administration in time range)    Or  acetaminophen (TYLENOL) suppository 650 mg (has no administration in time range)  ondansetron (ZOFRAN) tablet 4 mg (has no administration in time range)    Or  ondansetron (ZOFRAN) injection 4 mg (has no administration in time range)  furosemide (LASIX) 200 mg in dextrose 5 % 100 mL (2 mg/mL) infusion (has no administration in time range)  sodium chloride 0.9 % bolus 250 mL (has no administration in time range)  enoxaparin (LOVENOX) injection 30 mg (has no administration in time range)     IMPRESSION / MDM / ASSESSMENT AND PLAN / ED COURSE  I reviewed the triage vital signs and the nursing notes.                             82 year old female presenting with BLE swelling, chest pain and shortness of breath. Differential includes, but is not limited to, viral syndrome, bronchitis including COPD exacerbation, pneumonia, reactive airway disease including asthma, CHF including exacerbation with or without pulmonary/interstitial edema, pneumothorax, ACS, thoracic trauma, and pulmonary embolism.  I have personally reviewed patient's records and note her cardiology office visit from 07/15/2022:  Chronic diastolic CHF: Echo in 9/23 showed EF 65-70%, normal RV, moderate mitral stenosis mean gradient 6, moderate AS mean gradient 31 with AVA 1.12 cm^2 and DI 0.29.  I suspect that valvular heart disease (moderate, ?severe AS and moderate MS) is contributing to HFpEF.  She is markedly volume overloaded on exam with NYHA class III symptoms.  She does not feel that current torsemide dosing is working.  - Increase torsemide to 80 mg bid.  - I will have her take  metolazone 2.5 x 1 on Friday morning and again on Monday morning with am torsemide dose.  She will not take additional metolazone after this unless we direct her to do so.  - She will need to take KCl 40 daily with extra 20 daily on metolazone days.  - BMET/BNP today and BMET in 1 week.  - Start Farxiga 10 mg daily.  - I will have her get repeat echo to assess valves (see below)  Patient's presentation is most consistent with acute presentation with potential threat to life or bodily function.  The patient is on the cardiac monitor to evaluate for evidence of arrhythmia and/or significant heart rate changes.  Laboratory results demonstrate stable hemoglobin of 8, mild hypokalemia with potassium 3.3, AKI creatinine 2.15.  This increased from creatinine of 1.68 just 4 days ago.  Initial troponin mildly elevated; this is likely secondary to demand ischemia.  Chest x-ray demonstrates interstitial edema.  Patient is medically complex and that she requires a diuretic given her symptoms of edema, shortness of breath and dyspnea on exertion, on the other hand her creatinine has increased secondary to recent increase of diuretics.  For now, will administer 1 mg IV Bumex as patient was very tachypneic just ambulating the short distance to the commode.      FINAL CLINICAL IMPRESSION(S) / ED DIAGNOSES   Final diagnoses:  Acute on chronic congestive heart failure, unspecified heart  failure type (HCC)  Peripheral edema  Chest pain, unspecified type  Shortness of breath  AKI (acute kidney injury) (HCC)  Dyspnea on exertion  Hypokalemia     Rx / DC Orders   ED Discharge Orders     None        Note:  This document was prepared using Dragon voice recognition software and may include unintentional dictation errors.   Irean Hong, MD 07/19/22 [phone]

## 2022-07-19 NOTE — Progress Notes (Signed)
Rebecca Gilmore is a 82 y.o. female with medical history significant for HTN, dCHF (EF 65 to 70% 11/2021), moderate aortic valve stenosis, CKD-3B, atrial fibrillation and DVT not on anticoagulants, CAD, multifactorial anemia secondary to CKD, recurrent GI bleed, requiring multiple transfusions but with negative extensive GI workup, and normal bone marrow biopsy 02/2022.  She presented to the hospital with increasing bilateral leg swelling and exertional shortness of breath.  She was recently seen by her cardiologist on 07/15/2022 and had torsemide was increased to 80 mg twice a day and metolazone was added to her medications.  Despite adherence to medication regimen, her symptoms persisted.  She presented to the emergency department on 07/18/2022.   She was admitted to the hospital for acute exacerbation of chronic diastolic CHF.  She was initially treated with IV Bumex.  She is now on IV Lasix infusion for diuresis.  Monitor renal function closely.  Consulted Dr. Darrold Junker, cardiologist, to assist with management.

## 2022-07-19 NOTE — Assessment & Plan Note (Addendum)
Patient with soft Bps Low concern for cardiogenic/circulatory shock . Mentating well Avoiding aggressive diuresis due to AS. Will give lasix drip Cardiology consulted

## 2022-07-19 NOTE — Assessment & Plan Note (Signed)
Complicating factor to overall prognosis and care 

## 2022-07-19 NOTE — Progress Notes (Signed)
Anticoagulation monitoring(Lovenox):  82 yo female ordered Lovenox 30 mg Q24h    Filed Weights   07/19/22 0221  Weight: 104.8 kg (231 lb)   BMI  40  Lab Results  Component Value Date   CREATININE 2.15 (H) 07/18/2022   CREATININE 1.68 (H) 07/15/2022   CREATININE 1.96 (H) 03/03/2022   Estimated Creatinine Clearance: 23.8 mL/min (A) (by C-G formula based on SCr of 2.15 mg/dL (H)). Hemoglobin & Hematocrit     Component Value Date/Time   HGB 8.0 (L) 07/18/2022 2203   HGB 7.3 (L) 07/14/2022 1259   HCT 27.2 (L) 07/18/2022 2203     Per Protocol for Patient with estCrcl < 30 ml/min and BMI > 30, will transition to Lovenox 40 mg Q24H.

## 2022-07-19 NOTE — Assessment & Plan Note (Signed)
Received oral repletion in the ED for potassium of 3.3 Continue to monitor

## 2022-07-19 NOTE — Assessment & Plan Note (Addendum)
Aortic stenosis Symptomatic anemia Possibly multifactorial and related to aortic stenosis, symptomatic anemia At improving with increase in home torsemide and metolazone Lasix drip due to soft blood pressure Continue recently started [place].  Will hold metolazone Daily weights with intake and output monitoring Echocardiogram Cardiology consulted.  Per recent visit on 5/8:"If AS is severe, she will need RHC/LHC using minimal contrast and evaluation for TAVR"

## 2022-07-20 ENCOUNTER — Inpatient Hospital Stay (HOSPITAL_COMMUNITY)
Admit: 2022-07-20 | Discharge: 2022-07-20 | Disposition: A | Discharge status: Home / Self Care | Payer: Medicare Other | Attending: Internal Medicine | Admitting: Internal Medicine

## 2022-07-20 DIAGNOSIS — I5031 Acute diastolic (congestive) heart failure: Secondary | ICD-10-CM | POA: Diagnosis not present

## 2022-07-20 DIAGNOSIS — I5033 Acute on chronic diastolic (congestive) heart failure: Secondary | ICD-10-CM | POA: Diagnosis not present

## 2022-07-20 LAB — BASIC METABOLIC PANEL
Anion gap: 10 (ref 5–15)
BUN: 39 mg/dL — ABNORMAL HIGH (ref 8–23)
CO2: 32 mmol/L (ref 22–32)
Calcium: 8.9 mg/dL (ref 8.9–10.3)
Chloride: 95 mmol/L — ABNORMAL LOW (ref 98–111)
Creatinine, Ser: 2.33 mg/dL — ABNORMAL HIGH (ref 0.44–1.00)
GFR, Estimated: 20 mL/min — ABNORMAL LOW (ref >60)
Glucose, Bld: 108 mg/dL — ABNORMAL HIGH (ref 70–99)
Potassium: 3.7 mmol/L (ref 3.5–5.1)
Sodium: 137 mmol/L (ref 135–145)

## 2022-07-20 LAB — ECHOCARDIOGRAM COMPLETE
AR max vel: 0.75 cm2
AV Area VTI: 0.82 cm2
AV Area mean vel: 0.77 cm2
AV Mean grad: 30.3 mmHg
AV Peak grad: 52.1 mmHg
Ao pk vel: 3.61 m/s
Area-P 1/2: 3.39 cm2
Height: 64 in
MV VTI: 1.15 cm2
S' Lateral: 3.1 cm
Weight: 3696 oz

## 2022-07-20 LAB — IRON AND TIBC
Iron: 23 ug/dL — ABNORMAL LOW (ref 28–170)
Saturation Ratios: 8 % — ABNORMAL LOW (ref 10.4–31.8)
TIBC: 297 ug/dL (ref 250–450)
UIBC: 274 ug/dL

## 2022-07-20 LAB — FERRITIN: Ferritin: 100 ng/mL (ref 11–307)

## 2022-07-20 LAB — MAGNESIUM: Magnesium: 2 mg/dL (ref 1.7–2.4)

## 2022-07-20 MED ORDER — MIDODRINE HCL 5 MG PO TABS
10.0000 mg | ORAL_TABLET | Freq: Once | ORAL | Status: AC
  Administered 2022-07-20: 10 mg via ORAL
  Filled 2022-07-20: qty 2

## 2022-07-20 MED ORDER — ENOXAPARIN SODIUM 30 MG/0.3ML IJ SOSY
30.0000 mg | PREFILLED_SYRINGE | INTRAMUSCULAR | Status: DC
  Administered 2022-07-21 – 2022-07-22 (×2): 30 mg via SUBCUTANEOUS
  Filled 2022-07-20 (×2): qty 0.3

## 2022-07-20 MED ORDER — MIDODRINE HCL 5 MG PO TABS
5.0000 mg | ORAL_TABLET | Freq: Three times a day (TID) | ORAL | Status: DC
  Administered 2022-07-20 – 2022-07-21 (×3): 5 mg via ORAL
  Filled 2022-07-20 (×2): qty 1

## 2022-07-20 NOTE — ED Notes (Signed)
Rounded on patient. Patient supine on stretcher with respirations even and non labored, patient without signs of distress. Patient denies needs at this time.   

## 2022-07-20 NOTE — Progress Notes (Signed)
*  PRELIMINARY RESULTS* Echocardiogram 2D Echocardiogram has been performed.  Rebecca Gilmore 07/20/2022, 2:26 PM

## 2022-07-20 NOTE — ED Notes (Signed)
Patient to ER 32, received report from [place], [place]. Patient used walker to bed, independently. Patient A+OX4, NAD on arrival. Patient oriented to room. Patient informed on how to use call bell and bed controls. Bed low and locked. Patient denies further needs at this time.

## 2022-07-20 NOTE — Discharge Instructions (Signed)

## 2022-07-20 NOTE — ED Notes (Signed)
Rebecca Gilmore made aware of low BP. No new orders at this time. WCTM. Pt has no complaints at this time, she endorses her BP "is normally low". Pt NAD. Call light in reach.

## 2022-07-20 NOTE — ED Notes (Signed)
This RN has given the pt 10mg  Midodrine PO for hypotension per NP. [place] orders. The pt is to be reassess one hour after the administration per NP.Foust, the pt denies any dizziness, weakness, SOB while resting, n/v at this time. Close monitoring continued.

## 2022-07-20 NOTE — ED Notes (Signed)
Rebecca Gilmore made aware of low BP's, no new orders at this time.

## 2022-07-20 NOTE — Progress Notes (Signed)
Progress Note    Rebecca Gilmore  ZOX:[id number] DOB: 06/16/1940  DOA: 07/19/2022 PCP: Ailene Ravel, MD      Brief Narrative:    Medical records reviewed and are as summarized below:  Rebecca Gilmore is a 82 y.o. female with medical history significant for HTN, dCHF (EF 65 to 70% 11/2021), moderate aortic valve stenosis, CKD-3B, atrial fibrillation and DVT not on anticoagulants, CAD, multifactorial anemia secondary to CKD, recurrent GI bleed, requiring multiple transfusions but with negative extensive GI workup, and normal bone marrow biopsy 02/2022.  She presented to the hospital with increasing bilateral leg swelling and exertional shortness of breath.  She was recently seen by her cardiologist on 07/15/2022 and had torsemide was increased to 80 mg twice a day and metolazone was added to her medications.  Despite adherence to medication regimen, her symptoms persisted.  She presented to the emergency department on 07/18/2022.   She was admitted to the hospital for acute exacerbation of chronic diastolic CHF.  She was initially treated with IV Bumex and subsequently transition to IV Lasix infusion.  She developed hypotension and AKI (worsening renal function) Lasix drip was discontinued.   Assessment/Plan:   Principal Problem:   Acute on chronic diastolic (congestive) heart failure (HCC) Active Problems:   Hypotension   Nonrheumatic aortic valve stenosis   Coronary artery disease   Elevated troponin   Iron deficiency anemia   Acute renal failure superimposed on stage 3b chronic kidney disease (HCC)   History of recurrent GI bleed   Anemia of chronic renal failure, stage 3b (HCC)   Atrial fibrillation, chronic (HCC)   Hypokalemia   Depression   Morbid obesity with BMI of 40.0-44.9, adult (HCC)    Body mass index is 39.65 kg/m.  (Morbid obesity)   Acute on chronic diastolic CHF, moderate aortic stenosis: Lasix drip has been discontinued because of AKI and hypotension.   Monitor BMP, daily weight and urine output.  Follow-up with cardiologist.   BNP was 278.  2D echo is pending.   AKI on CKD stage IIIb: Lasix has been discontinued.  Monitor BMP and urine output closely.   Hypotension: Diuretics on hold.  Monitor BP closely.   Hypokalemia: Improved   Chronic anemia, iron deficiency anemia, anemia of chronic disease: Monitor H&H. Negative bone marrow biopsy in December 2023.  She receives Venofer as an outpatient.  Extensive GI workup including EGD, colonoscopy and capsule endoscopy were unremarkable.   CAD, mildly elevated troponins: Mildly elevated troponins likely from demand ischemia.  Continue Plavix and statin   Paroxysmal atrial fibrillation: She is not on long-term anticoagulation because of concern for high bleeding risk.   Suspect she may have chronic venous insufficiency contributing to peripheral edema.  Diet Order             Diet 2 gram sodium Room service appropriate? Yes; Fluid consistency: Thin  Diet effective now                            Consultants: Cardiologist  Procedures: None    Medications:    clopidogrel  75 mg Oral QODAY   dapagliflozin propanediol  10 mg Oral Daily   [START ON 07/21/2022] enoxaparin (LOVENOX) injection  30 mg Subcutaneous Q24H   ferrous sulfate  325 mg Oral Daily   pantoprazole  40 mg Oral Daily   potassium chloride SA  60 mEq Oral Daily   pregabalin  75 mg Oral Daily   rosuvastatin  5 mg Oral Daily   sertraline  50 mg Oral Daily   Continuous Infusions:   Anti-infectives (From admission, onward)    None              Family Communication/Anticipated D/C date and plan/Code Status   DVT prophylaxis: enoxaparin (LOVENOX) injection 30 mg Start: 07/21/22 0800     Code Status: Full Code  Family Communication: None Disposition Plan: Plan to discharge home in 1 to 2 days   Status is: Inpatient Remains inpatient appropriate because: Acute CHF exacerbation,  AKI       Subjective:   Interval events noted.  No shortness of breath or chest pain.  She feels better.  Objective:    Vitals:   07/20/22 0900 07/20/22 0915 07/20/22 1000 07/20/22 1220  BP: (!) 100/35 (!) 106/34 114/69   Pulse: 66 64 64   Resp: 18 19    Temp:    97.6 F (36.4 C)  TempSrc:    Oral  SpO2: 100% 91% 90%   Weight:      Height:       No data found.   Intake/Output Summary (Last 24 hours) at 07/20/2022 1245 Last data filed at 07/20/2022 9147 Gross per 24 hour  Intake --  Output 2600 ml  Net -2600 ml   Filed Weights   07/19/22 0221  Weight: 104.8 kg    Exam:  GEN: NAD SKIN: Warm and dry EYES: EOMI ENT: MMM CV: RRR PULM: CTA B ABD: soft, obese, NT, +BS CNS: AAO x 3, non focal EXT: B/l leg edema, no tenderness        Data Reviewed:   I have personally reviewed following labs and imaging studies:  Labs: Labs show the following:   Basic Metabolic Panel: Recent Labs  Lab 07/15/22 1648 07/18/22 2203 07/19/22 0443 07/20/22 0419  NA 138 137  --  137  K 3.1* 3.3* 3.4* 3.7  CL 99 99  --  95*  CO2 29 30  --  32  GLUCOSE 113* 117*  --  108*  BUN 28* 37*  --  39*  CREATININE 1.68* 2.15* 2.14* 2.33*  CALCIUM 9.0 9.1  --  8.9  MG  --   --  2.1 2.0   GFR Estimated Creatinine Clearance: 22 mL/min (A) (by C-G formula based on SCr of 2.33 mg/dL (H)). Liver Function Tests: No results for input(s): "AST", "ALT", "ALKPHOS", "BILITOT", "PROT", "ALBUMIN" in the last 168 hours. No results for input(s): "LIPASE", "AMYLASE" in the last 168 hours. No results for input(s): "AMMONIA" in the last 168 hours. Coagulation profile No results for input(s): "INR", "PROTIME" in the last 168 hours.  CBC: Recent Labs  Lab 07/14/22 1259 07/18/22 2203 07/19/22 0443  WBC  --  6.3 5.3  HGB 7.3* 8.0* 7.4*  HCT 24.1* 27.2* 25.3*  MCV  --  95.4 96.6  PLT  --  300 264   Cardiac Enzymes: No results for input(s): "CKTOTAL", "CKMB", "CKMBINDEX",  "TROPONINI" in the last 168 hours. BNP (last 3 results) No results for input(s): "PROBNP" in the last 8760 hours. CBG: No results for input(s): "GLUCAP" in the last 168 hours. D-Dimer: No results for input(s): "DDIMER" in the last 72 hours. Hgb A1c: No results for input(s): "HGBA1C" in the last 72 hours. Lipid Profile: No results for input(s): "CHOL", "HDL", "LDLCALC", "TRIG", "CHOLHDL", "LDLDIRECT" in the last 72 hours. Thyroid function studies: No results for input(s): "TSH", "T4TOTAL", "  T3FREE", "THYROIDAB" in the last 72 hours.  Invalid input(s): "FREET3" Anemia work up: Recent Labs    07/20/22 0419  FERRITIN 100  TIBC 297  IRON 23*   Sepsis Labs: Recent Labs  Lab 07/18/22 2203 07/19/22 0443  WBC 6.3 5.3    Microbiology No results found for this or any previous visit (from the past 240 hour(s)).  Procedures and diagnostic studies:  DG Chest 2 View  Result Date: 07/18/2022 CLINICAL DATA:  Bilateral leg swelling. EXAM: CHEST - 2 VIEW COMPARISON:  February 18, 2022 FINDINGS: There is stable mild to moderate severity enlargement of the cardiac silhouette. Marked severity calcification of the aortic arch is seen. Low lung volumes are noted with stable elevation of the right hemidiaphragm. Mild, diffuse, chronic appearing increased interstitial lung markings are present. There is no evidence of focal consolidation, pleural effusion or pneumothorax. Multilevel degenerative changes are seen throughout the thoracic spine. IMPRESSION: Chronic appearing increased interstitial lung markings without evidence of acute or active cardiopulmonary disease. Electronically Signed   By: Aram Candela M.D.   On: 07/18/2022 23:46               LOS: 1 day   Rebecca Gilmore  [facility]   Pager on [email or web address]. If 7PM-7AM, please contact night-coverage at [email or web address]     07/20/2022, 12:45 PM

## 2022-07-20 NOTE — Progress Notes (Addendum)
[facility] Cardiology  CARDIOLOGY CONSULT NOTE  Patient ID: Rebecca Gilmore MRN: [id number] DOB/AGE: 82-Feb-1942 82 y.o.  Admit date: 07/19/2022 Referring Physician Myriam Forehand Primary Physician [facility] Primary Cardiologist Paraschos Reason for Consultation acute on chronic diastolic congestive heart failure  HPI: 82 year old female referred for evaluation of acute on chronic diastolic congestive heart failure (HFpEF).  The patient has known history of coronary artery disease, moderate aortic stenosis, chronic diastolic congestive heart failure (HFpEF).  She presents with several week history of exertional dyspnea, edema and weight gain.  She was seen as an outpatient at the congestive heart failure clinic on 07/15/2022 at which time she was continued on torsemide 80 mg twice daily and started on metolazone 5 mg daily without significant benefit.  Presents to [facility] emergency room chest x-ray revealed interstitial lung markings consistent with pulmonary edema.  Admission labs notable for AKI with underlying CKD with BUN and creatinine 37 and 2.15, respectively.  BNP was 209.3.  Hemoglobin hematocrit were 8.0 and 27.2, respectively.  The patient was treated with Bumex 1 mg IV with initial diuresis and overall clinical improvement.  She was started on furosemide drip.  The patient has a history of NSTEMI, underwent cardiac catheterization at [facility] 03/06/2021 which revealed 80% stenosis ostial RCA, diffuse stenosis mid to distal RCA, 50-60% stenosis proximal/mid LAD, treated medically.  D echocardiogram 11/17/2021 revealed LVEF 65-70% with moderate aortic stenosis (AVA 0.98 cm, peak velocity 3.63 m/s, peak gradient 52.8 mmHg, mean gradient 26.5 mmHg.  She has known peripheral vascular disease status post left ileal endarterectomy and thrombectomy 11/16/2021.  History of upper GI bleed with chronic anemia.  She has chronic kidney disease stage IIIb.  Interval History:  -Hypotensive this morning, Lasix drip stopped -Creatinine  uptrending from 2.15 -- 2.14 --2.33 -Brisk urine output overnight with net -3.4 L -The patient feels about the same as yesterday, noted modest improvement in her peripheral edema but still not back to baseline. -No chest pain, lightheadedness or dizziness, or shortness of breath -Echo pending  Review of systems complete and found to be negative unless listed above     Past Medical History:  Diagnosis Date   Anemia    Arrhythmia    atrial fibrillation   CHF (congestive heart failure) (HCC)    Chronic kidney disease    Depression    HBP (high blood pressure)    History of bladder problems    Memory loss    Muscle pain    Osteoporosis    Reflux    Sinus congestion    Swelling    B/L FEET AND LEGS    Past Surgical History:  Procedure Laterality Date   ARTERY BIOPSY Bilateral 12/01/2016   Procedure: BIOPSY TEMPORAL ARTERY;  Surgeon: Fransisco Hertz, MD;  Location: [facility] OR;  Service: Vascular;  Laterality: Bilateral;   CATARACT SURGERY     COLONOSCOPY WITH PROPOFOL N/A 12/13/2021   Procedure: COLONOSCOPY WITH PROPOFOL;  Surgeon: Wyline Mood, MD;  Location: [facility] ENDOSCOPY;  Service: Gastroenterology;  Laterality: N/A;   ESOPHAGOGASTRODUODENOSCOPY (EGD) WITH PROPOFOL N/A 12/12/2021   Procedure: ESOPHAGOGASTRODUODENOSCOPY (EGD) WITH PROPOFOL;  Surgeon: Jaynie Collins, DO;  Location: [facility] ENDOSCOPY;  Service: Gastroenterology;  Laterality: N/A;   EYELID SURGERY Bilateral    GIVENS CAPSULE STUDY N/A 12/15/2021   Procedure: GIVENS CAPSULE STUDY;  Surgeon: Jaynie Collins, DO;  Location: [facility] ENDOSCOPY;  Service: Gastroenterology;  Laterality: N/A;   GIVENS CAPSULE STUDY N/A 01/28/2022   Procedure: GIVENS CAPSULE STUDY;  Surgeon: Lannette Donath  Betti Cruz, MD;  Location: [facility] ENDOSCOPY;  Service: Gastroenterology;  Laterality: N/A;   GROWTH ON FACE     KNEE SURGERY Right    THROMBECTOMY FEMORAL ARTERY Left 11/16/2021   Procedure: THROMBECTOMY FEMORAL ARTERY;  Surgeon: Fransisco Hertz, MD;   Location: [facility] ORS;  Service: Vascular;  Laterality: Left;    (Not in a hospital admission)  Social History   Socioeconomic History   Marital status: Married    Spouse name: Not on file   Number of children: Not on file   Years of education: Not on file   Highest education level: Not on file  Occupational History   Not on file  Tobacco Use   Smoking status: Former    Types: Cigarettes    Quit date: 01/12/1993    Years since quitting: 29.5   Smokeless tobacco: Former    Types: Snuff, Chew   Tobacco comments:    DATE QUIT UNKNOWN  Vaping Use   Vaping Use: Never used  Substance and Sexual Activity   Alcohol use: No   Drug use: No   Sexual activity: Not on file  Other Topics Concern   Not on file  Social History Narrative   Not on file   Social Determinants of Health   Financial Resource Strain: Not on file  Food Insecurity: No Food Insecurity (02/18/2022)   Hunger Vital Sign    Worried About Running Out of Food in the Last Year: Never true    Ran Out of Food in the Last Year: Never true  Transportation Needs: No Transportation Needs (02/18/2022)   PRAPARE - Administrator, Civil Service (Medical): No    Lack of Transportation (Non-Medical): No  Physical Activity: Not on file  Stress: Not on file  Social Connections: Not on file  Intimate Partner Violence: Not At Risk (02/18/2022)   Humiliation, Afraid, Rape, and Kick questionnaire    Fear of Current or Ex-Partner: No    Emotionally Abused: No    Physically Abused: No    Sexually Abused: No    Family History  Problem Relation Age of Onset   Heart disease Mother    AAA (abdominal aortic aneurysm) Mother    Heart disease Father     PHYSICAL EXAM  General: Elderly chronically ill-appearing Caucasian female, sitting upright in bed. HEENT:  Normocephalic and atramatic Neck:  No JVD.  Lungs: Decreased breath sounds bilaterally without appreciable crackles or wheezes. Heart: Irregularly irregular  with controlled rate. Normal S1 and S2, 3/6 systolic murmur heard throughout.   Abdomen: Mildly distended with excess adiposity. Msk:  Normal strength and tone for age. Extremities: Bilateral lower extremities with 1-2+ pitting edema and generalized erythema.  Some skin dimpling and wrinkling Neuro: Alert and oriented X 3. Psych:  Good affect, responds appropriately  Labs:   Lab Results  Component Value Date   WBC 5.3 07/19/2022   HGB 7.4 (L) 07/19/2022   HCT 25.3 (L) 07/19/2022   MCV 96.6 07/19/2022   PLT 264 07/19/2022    Recent Labs  Lab 07/20/22 0419  NA 137  K 3.7  CL 95*  CO2 32  BUN 39*  CREATININE 2.33*  CALCIUM 8.9  GLUCOSE 108*    No results found for: "CKTOTAL", "CKMB", "CKMBINDEX", "TROPONINI"  Lab Results  Component Value Date   CHOL 106 07/15/2022   CHOL 111 12/12/2021   Lab Results  Component Value Date   HDL 50 07/15/2022   HDL 41 12/12/2021   Lab  Results  Component Value Date   LDLCALC 36 07/15/2022   LDLCALC 52 12/12/2021   Lab Results  Component Value Date   TRIG 101 07/15/2022   TRIG 88 12/12/2021   Lab Results  Component Value Date   CHOLHDL 2.1 07/15/2022   CHOLHDL 2.7 12/12/2021   No results found for: "LDLDIRECT"    Radiology: DG Chest 2 View  Result Date: 07/18/2022 CLINICAL DATA:  Bilateral leg swelling. EXAM: CHEST - 2 VIEW COMPARISON:  February 18, 2022 FINDINGS: There is stable mild to moderate severity enlargement of the cardiac silhouette. Marked severity calcification of the aortic arch is seen. Low lung volumes are noted with stable elevation of the right hemidiaphragm. Mild, diffuse, chronic appearing increased interstitial lung markings are present. There is no evidence of focal consolidation, pleural effusion or pneumothorax. Multilevel degenerative changes are seen throughout the thoracic spine. IMPRESSION: Chronic appearing increased interstitial lung markings without evidence of acute or active cardiopulmonary disease.  Electronically Signed   By: Aram Candela M.D.   On: 07/18/2022 23:46    EKG: Atrial fibrillation at 53 bpm  ASSESSMENT AND PLAN:   1.  Acute on chronic HFpEF, with several week history of worsening exertional dyspnea, peripheral edema and weight gain, fracture to outpatient diuretics (torsemide 80 mg twice daily and metolazone 5 mg daily), with good initial diuresis after Bumex 1 mg IV, now on furosemide drip 2.  CAD, status post NSTEMI, cardiac catheterization at [facility] 02/04/2021 which revealed 80% stenosis ostial RCA with diffuse mid to distal disease, 50-60% stenosis proximal and mid LAD, treated medically, currently without chest pain, troponin 26, ECG nondiagnostic 3.  Moderate aortic stenosis, 2D echocardiogram 11/17/2021 revealed LVEF 65-70% with calculated aortic valve area 0.98 cm, peak velocity 3.63 m/s, peak gradient 52.8 mmHg, mean gradient 26.5 mmHg 4.  AKI/CKD 3b, BUN and creatinine 37 and 2.15, respectively 5.  Chronic atrial fibrillation, rate controlled, not on chronic anticoagulation, with history of upper GI bleed and chronic anemia 6.  Peripheral vascular disease, status post ileal endarterectomy and thrombectomy 11/16/2021 on clopidogrel  Recommendations  1.  Agree with current therapy 2.  Agree with holding furosemide drip today with increasing creatinine and low BP 3.  Carefully monitor renal status 4.  Good medical management limited by bradycardia, low blood pressure, CKD 3b 5.  May require outpatient evaluation with right and left heart catheterization to evaluate need for TAVR. 6.  Will review 2D echocardiogram with Dr. Darrold Junker.  This patient's plan of care was discussed and created with Dr. Darrold Junker and he is in agreement.    Signed: Cheryln Manly Aleiya Rye PA-C 07/20/2022, 9:37 AM

## 2022-07-20 NOTE — ED Notes (Addendum)
Oxygen turned off at this time. WCTM.   Order to ambulate pt, This RN expressing concern for ambulation with current BP of 100/35(52). This RN will get pt out of bed if MAP increases to 65 or greater. WCTM.  No new orders at this time.

## 2022-07-20 NOTE — ED Notes (Signed)
HR intermittently to 45 on monitor. Foust, NP aware. Apical pulse 57 on auscultation.

## 2022-07-20 NOTE — Progress Notes (Signed)
PHARMACIST - PHYSICIAN COMMUNICATION  CONCERNING:  Enoxaparin (Lovenox) for DVT Prophylaxis    RECOMMENDATION: Patient was prescribed enoxaprin 40mg  q24 hours for VTE prophylaxis.   Filed Weights   07/19/22 0221  Weight: 104.8 kg (231 lb)    Body mass index is 39.65 kg/m.  Estimated Creatinine Clearance: 22 mL/min (A) (by C-G formula based on SCr of 2.33 mg/dL (H)).    Patient is candidate for enoxaparin 30mg  every 24 hours based on CrCl <76ml/min   DESCRIPTION: Pharmacy has adjusted enoxaparin dose per [facility] policy.  Patient is now receiving enoxaparin 30 mg every 24 hours    Elliot Gurney, PharmD, BCPS Clinical Pharmacist  07/20/2022 10:29 AM

## 2022-07-21 ENCOUNTER — Inpatient Hospital Stay: Payer: Medicare Other

## 2022-07-21 DIAGNOSIS — I959 Hypotension, unspecified: Secondary | ICD-10-CM | POA: Diagnosis not present

## 2022-07-21 DIAGNOSIS — N1832 Chronic kidney disease, stage 3b: Secondary | ICD-10-CM | POA: Diagnosis not present

## 2022-07-21 DIAGNOSIS — I5033 Acute on chronic diastolic (congestive) heart failure: Secondary | ICD-10-CM | POA: Diagnosis present

## 2022-07-21 DIAGNOSIS — N179 Acute kidney failure, unspecified: Secondary | ICD-10-CM

## 2022-07-21 LAB — BASIC METABOLIC PANEL
Anion gap: 8 (ref 5–15)
BUN: 45 mg/dL — ABNORMAL HIGH (ref 8–23)
CO2: 32 mmol/L (ref 22–32)
Calcium: 8.9 mg/dL (ref 8.9–10.3)
Chloride: 98 mmol/L (ref 98–111)
Creatinine, Ser: 2.29 mg/dL — ABNORMAL HIGH (ref 0.44–1.00)
GFR, Estimated: 21 mL/min — ABNORMAL LOW (ref >60)
Glucose, Bld: 100 mg/dL — ABNORMAL HIGH (ref 70–99)
Potassium: 3.8 mmol/L (ref 3.5–5.1)
Sodium: 138 mmol/L (ref 135–145)

## 2022-07-21 LAB — CBC
HCT: 23.9 % — ABNORMAL LOW (ref 36.0–46.0)
Hemoglobin: 7.3 g/dL — ABNORMAL LOW (ref 12.0–15.0)
MCH: 28.6 pg (ref 26.0–34.0)
MCHC: 30.5 g/dL (ref 30.0–36.0)
MCV: 93.7 fL (ref 80.0–100.0)
Platelets: 272 10*3/uL (ref 150–400)
RBC: 2.55 MIL/uL — ABNORMAL LOW (ref 3.87–5.11)
RDW: 15.3 % (ref 11.5–15.5)
WBC: 5.3 10*3/uL (ref 4.0–10.5)
nRBC: 0 % (ref 0.0–0.2)

## 2022-07-21 LAB — GLUCOSE, CAPILLARY: Glucose-Capillary: 98 mg/dL (ref 70–99)

## 2022-07-21 MED ORDER — DOPAMINE-DEXTROSE 3.2-5 MG/ML-% IV SOLN
0.0000 ug/kg/min | INTRAVENOUS | Status: DC
  Filled 2022-07-21: qty 250

## 2022-07-21 MED ORDER — SODIUM CHLORIDE 0.9 % IV BOLUS
250.0000 mL | Freq: Once | INTRAVENOUS | Status: AC
  Administered 2022-07-21: 250 mL via INTRAVENOUS

## 2022-07-21 MED ORDER — SODIUM CHLORIDE 0.9 % IV BOLUS: 250.0000 mL | Freq: Once | INTRAVENOUS | Status: DC

## 2022-07-21 MED ORDER — MIDODRINE HCL 5 MG PO TABS
10.0000 mg | ORAL_TABLET | Freq: Three times a day (TID) | ORAL | Status: DC
  Administered 2022-07-21 – 2022-07-22 (×3): 10 mg via ORAL
  Filled 2022-07-21 (×3): qty 2

## 2022-07-21 MED ORDER — SODIUM CHLORIDE 0.9 % IV SOLN
200.0000 mg | Freq: Once | INTRAVENOUS | Status: AC
  Administered 2022-07-21: 200 mg via INTRAVENOUS
  Filled 2022-07-21: qty 200

## 2022-07-21 NOTE — Progress Notes (Signed)
Progress Note    Rebecca Gilmore  RUE:[id number] DOB: January 15, 1941  DOA: 07/19/2022 PCP: Ailene Ravel, MD      Brief Narrative:    Medical records reviewed and are as summarized below:  Rebecca Gilmore is a 82 y.o. female with medical history significant for HTN, dCHF (EF 65 to 70% 11/2021), moderate aortic valve stenosis, CKD-3B, atrial fibrillation and DVT not on anticoagulants, CAD, multifactorial anemia secondary to CKD, recurrent GI bleed, requiring multiple transfusions but with negative extensive GI workup, and normal bone marrow biopsy 02/2022.  She presented to the hospital with increasing bilateral leg swelling and exertional shortness of breath.  She was recently seen by her cardiologist on 07/15/2022 and had torsemide was increased to 80 mg twice a day and metolazone was added to her medications.  Despite adherence to medication regimen, her symptoms persisted.  She presented to the emergency department on 07/18/2022.   She was admitted to the hospital for acute exacerbation of chronic diastolic CHF.  She was initially treated with IV Bumex and subsequently transition to IV Lasix infusion.  She developed hypotension and AKI (worsening renal function) Lasix drip was discontinued.   Assessment/Plan:   Principal Problem:   Acute on chronic diastolic (congestive) heart failure (HCC) Active Problems:   Hypotension   Nonrheumatic aortic valve stenosis   Coronary artery disease   Elevated troponin   Iron deficiency anemia   Acute renal failure superimposed on stage 3b chronic kidney disease (HCC)   History of recurrent GI bleed   Anemia of chronic renal failure, stage 3b (HCC)   Atrial fibrillation, chronic (HCC)   Hypokalemia   Depression   Morbid obesity with BMI of 40.0-44.9, adult (HCC)    Body mass index is 39.65 kg/m.  (Morbid obesity)   Acute on chronic diastolic CHF, severe aortic stenosis, moderate mitral stenosis: Lasix drip has been discontinued because of  AKI and hypotension.  Monitor BMP, daily weight and urine output.   BNP was 278.  2D echo showed EF estimated at 55 to 60%, mild LVH, indeterminate LV diastolic parameters, severely dilated left atrium, mild to moderate TR, moderate mitral stenosis, severe aortic stenosis Follow-up with cardiologist for further recommendations.   AKI on CKD stage IIIb: She is off of Lasix.  Creatinine is starting to trend down slowly.  Monitor BMP and urine output closely.   Persistent hypotension: She was started on midodrine on 07/20/2022.  However, BP was too low and was as low as 61/37 in the middle of the night.  Normal saline to 250 mL bolus was given for persistent hypotension.   Hypokalemia: Improved   Chronic anemia, iron deficiency anemia, anemia of chronic disease: Monitor H&H. Negative bone marrow biopsy in December 2023.  She receives Venofer as an outpatient.  Patient said she is due for IV iron infusion at the cancer center today.  She will be given IV iron sucrose today while inpatient.  Extensive GI workup including EGD, colonoscopy and capsule endoscopy were unremarkable.   CAD, mildly elevated troponins: Mildly elevated troponins likely from demand ischemia.  Continue Plavix and statin   Paroxysmal atrial fibrillation: She is not on long-term anticoagulation because of concern for high bleeding risk.   Suspect she may have chronic venous insufficiency contributing to peripheral edema.  Diet Order             Diet 2 gram sodium Room service appropriate? Yes; Fluid consistency: Thin  Diet effective now  Consultants: Cardiologist  Procedures: None    Medications:    clopidogrel  75 mg Oral QODAY   enoxaparin (LOVENOX) injection  30 mg Subcutaneous Q24H   ferrous sulfate  325 mg Oral Daily   midodrine  5 mg Oral TID WC   pantoprazole  40 mg Oral Daily   potassium chloride SA  60 mEq Oral Daily   pregabalin  75 mg Oral Daily    rosuvastatin  5 mg Oral Daily   sertraline  50 mg Oral Daily   Continuous Infusions:  iron sucrose       Anti-infectives (From admission, onward)    None              Family Communication/Anticipated D/C date and plan/Code Status   DVT prophylaxis: enoxaparin (LOVENOX) injection 30 mg Start: 07/21/22 0800     Code Status: Full Code  Family Communication: Plan discussed with Victorino Dike, daughter, over the phone Disposition Plan: Plan to discharge home in 1 to 2 days   Status is: Inpatient Remains inpatient appropriate because: Acute CHF exacerbation, AKI, hypotension       Subjective:   Interval events noted.  She complains of pain and numbness in bilateral legs.  No dizziness, cough, chest pain or shortness of breath.  BP has remained low overnight.  Objective:    Vitals:   07/21/22 1100 07/21/22 1200 07/21/22 1248 07/21/22 1253  BP: (!) 128/45 (!) 125/51 94/64   Pulse: 67 67 67 67  Resp: (!) 26 (!) 22 (!) 25 18  Temp:      TempSrc:      SpO2: 90% 93% 93% 95%  Weight:      Height:       No data found.  No intake or output data in the 24 hours ending 07/21/22 1306  Filed Weights   07/19/22 0221  Weight: 104.8 kg    Exam:  GEN: NAD SKIN: Warm and dry EYES: Anicteric ENT: MMM CV: RRR PULM: CTA B ABD: soft, obese, NT, +BS CNS: AAO x 3, non focal EXT: No edema or tenderness       Data Reviewed:   I have personally reviewed following labs and imaging studies:  Labs: Labs show the following:   Basic Metabolic Panel: Recent Labs  Lab 07/15/22 1648 07/18/22 2203 07/19/22 0443 07/20/22 0419 07/21/22 0654  NA 138 137  --  137 138  K 3.1* 3.3* 3.4* 3.7 3.8  CL 99 99  --  95* 98  CO2 29 30  --  32 32  GLUCOSE 113* 117*  --  108* 100*  BUN 28* 37*  --  39* 45*  CREATININE 1.68* 2.15* 2.14* 2.33* 2.29*  CALCIUM 9.0 9.1  --  8.9 8.9  MG  --   --  2.1 2.0  --    GFR Estimated Creatinine Clearance: 22.3 mL/min (A) (by C-G  formula based on SCr of 2.29 mg/dL (H)). Liver Function Tests: No results for input(s): "AST", "ALT", "ALKPHOS", "BILITOT", "PROT", "ALBUMIN" in the last 168 hours. No results for input(s): "LIPASE", "AMYLASE" in the last 168 hours. No results for input(s): "AMMONIA" in the last 168 hours. Coagulation profile No results for input(s): "INR", "PROTIME" in the last 168 hours.  CBC: Recent Labs  Lab 07/18/22 2203 07/19/22 0443 07/21/22 0654  WBC 6.3 5.3 5.3  HGB 8.0* 7.4* 7.3*  HCT 27.2* 25.3* 23.9*  MCV 95.4 96.6 93.7  PLT 300 264 272   Cardiac Enzymes: No results for input(s): "  CKTOTAL", "CKMB", "CKMBINDEX", "TROPONINI" in the last 168 hours. BNP (last 3 results) No results for input(s): "PROBNP" in the last 8760 hours. CBG: No results for input(s): "GLUCAP" in the last 168 hours. D-Dimer: No results for input(s): "DDIMER" in the last 72 hours. Hgb A1c: No results for input(s): "HGBA1C" in the last 72 hours. Lipid Profile: No results for input(s): "CHOL", "HDL", "LDLCALC", "TRIG", "CHOLHDL", "LDLDIRECT" in the last 72 hours. Thyroid function studies: No results for input(s): "TSH", "T4TOTAL", "T3FREE", "THYROIDAB" in the last 72 hours.  Invalid input(s): "FREET3" Anemia work up: Recent Labs    07/20/22 0419  FERRITIN 100  TIBC 297  IRON 23*   Sepsis Labs: Recent Labs  Lab 07/18/22 2203 07/19/22 0443 07/21/22 0654  WBC 6.3 5.3 5.3    Microbiology No results found for this or any previous visit (from the past 240 hour(s)).  Procedures and diagnostic studies:  ECHOCARDIOGRAM COMPLETE  Result Date: 07/20/2022    ECHOCARDIOGRAM REPORT   Patient Name:   Alvino Chapel [facility] Date of Exam: 07/20/2022 Medical Rec #:  [id number]       Height:       64.0 in Accession #:    [id number]      Weight:       231.0 lb Date of Birth:  [birth year]        BSA:          2.080 m Patient Age:    82 years        BP:           107/49 mmHg Patient Gender: F               HR:           67 bpm.  Exam Location:  [facility] Procedure: 2D Echo, Cardiac Doppler and Color Doppler Indications:     CHF--acute diastolic I50.31  History:         Patient has prior history of Echocardiogram examinations, most                  recent 11/17/2021. CHF.  Sonographer:     Cristela Blue Referring Phys:  [id number] Andris Baumann Diagnosing Phys: Lorine Bears MD IMPRESSIONS  1. Left ventricular ejection fraction, by estimation, is 55 to 60%. The left ventricle has normal function. The left ventricle has no regional wall motion abnormalities. There is mild left ventricular hypertrophy. Left ventricular diastolic parameters are indeterminate.  2. Right ventricular systolic function is normal. The right ventricular size is normal. There is mildly elevated pulmonary artery systolic pressure.  3. Left atrial size was severely dilated.  4. The mitral valve is abnormal. No evidence of mitral valve regurgitation. Moderate mitral stenosis. The mean mitral valve gradient is 7.0 mmHg. Severe mitral annular calcification.  5. Tricuspid valve regurgitation is mild to moderate.  6. The aortic valve is calcified. Aortic valve regurgitation is trivial. Severe aortic valve stenosis. Aortic valve area, by VTI measures 0.82 cm. Aortic valve mean gradient measures 30.2 mmHg. FINDINGS  Left Ventricle: Left ventricular ejection fraction, by estimation, is 55 to 60%. The left ventricle has normal function. The left ventricle has no regional wall motion abnormalities. The left ventricular internal cavity size was normal in size. There is  mild left ventricular hypertrophy. Left ventricular diastolic parameters are indeterminate. Right Ventricle: The right ventricular size is normal. No increase in right ventricular wall thickness. Right ventricular systolic function is normal. There is mildly elevated pulmonary artery systolic pressure. The  tricuspid regurgitant velocity is 2.81  m/s, and with an assumed right atrial pressure of 5 mmHg, the estimated right  ventricular systolic pressure is 36.6 mmHg. Left Atrium: Left atrial size was severely dilated. Right Atrium: Right atrial size was normal in size. Pericardium: There is no evidence of pericardial effusion. Mitral Valve: The mitral valve is abnormal. Severe mitral annular calcification. No evidence of mitral valve regurgitation. Moderate mitral valve stenosis. MV peak gradient, 21.0 mmHg. The mean mitral valve gradient is 7.0 mmHg. Tricuspid Valve: The tricuspid valve is normal in structure. Tricuspid valve regurgitation is mild to moderate. No evidence of tricuspid stenosis. Aortic Valve: The aortic valve is calcified. Aortic valve regurgitation is trivial. Severe aortic stenosis is present. Aortic valve mean gradient measures 30.2 mmHg. Aortic valve peak gradient measures 52.1 mmHg. Aortic valve area, by VTI measures 0.82 cm. Pulmonic Valve: The pulmonic valve was normal in structure. Pulmonic valve regurgitation is not visualized. No evidence of pulmonic stenosis. Aorta: The aortic root is normal in size and structure. Venous: The inferior vena cava was not well visualized. IAS/Shunts: No atrial level shunt detected by color flow Doppler.  LEFT VENTRICLE PLAX 2D LVIDd:         4.70 cm   Diastology LVIDs:         3.10 cm   LV e' medial:    8.92 cm/s LV PW:         1.50 cm   LV E/e' medial:  21.0 LV IVS:        0.90 cm   LV e' lateral:   7.62 cm/s LVOT diam:     2.00 cm   LV E/e' lateral: 24.5 LV SV:         65 LV SV Index:   31 LVOT Area:     3.14 cm  RIGHT VENTRICLE RV Basal diam:  2.90 cm RV Mid diam:    3.40 cm RV S prime:     10.20 cm/s TAPSE (M-mode): 1.5 cm LEFT ATRIUM            Index        RIGHT ATRIUM           Index LA diam:      5.10 cm  2.45 cm/m   RA Area:     15.30 cm LA Vol (A2C): 65.3 ml  31.40 ml/m  RA Volume:   33.20 ml  15.96 ml/m LA Vol (A4C): 116.0 ml 55.77 ml/m  AORTIC VALVE AV Area (Vmax):    0.75 cm AV Area (Vmean):   0.77 cm AV Area (VTI):     0.82 cm AV Vmax:           360.75  cm/s AV Vmean:          254.250 cm/s AV VTI:            0.788 m AV Peak Grad:      52.1 mmHg AV Mean Grad:      30.2 mmHg LVOT Vmax:         86.20 cm/s LVOT Vmean:        62.700 cm/s LVOT VTI:          0.207 m LVOT/AV VTI ratio: 0.26  AORTA Ao Root diam: 2.70 cm MITRAL VALVE                TRICUSPID VALVE MV Area (PHT): 3.39 cm     TR Peak grad:   31.6 mmHg MV Area VTI:  1.15 cm     TR Vmax:        281.00 cm/s MV Peak grad:  21.0 mmHg MV Mean grad:  7.0 mmHg     SHUNTS MV Vmax:       2.29 m/s     Systemic VTI:  0.21 m MV Vmean:      118.0 cm/s   Systemic Diam: 2.00 cm MV Decel Time: 224 msec MV E velocity: 187.00 cm/s MV A velocity: 98.50 cm/s MV E/A ratio:  1.90 Lorine Bears MD Electronically signed by Lorine Bears MD Signature Date/Time: 07/20/2022/5:37:11 PM    Final                LOS: 2 days   Avyana Puffenbarger  [facility]   Pager on [email or web address]. If 7PM-7AM, please contact night-coverage at [email or web address]     07/21/2022, 1:06 PM

## 2022-07-21 NOTE — Progress Notes (Signed)
[facility]  SUBJECTIVE: Patient laying in bed, denies chest pain or shortness of breath   Vitals:   07/21/22 1500 07/21/22 1520 07/21/22 1521 07/21/22 1618  BP: (!) 80/61     Pulse: (!) 57 60 69   Resp: (!) 23 20    Temp:    98.3 F (36.8 C)  TempSrc:    Oral  SpO2: 92% 94% 94%   Weight:      Height:        No intake or output data in the 24 hours ending 07/21/22 1634    PHYSICAL EXAM  General: Well developed, well nourished, in no acute distress HEENT:  Normocephalic and atramatic Neck:  No JVD.  Lungs: Clear bilaterally to auscultation and percussion. Heart: HRRR . Normal S1 and S2 without gallops or murmurs.  Abdomen: Bowel sounds are positive, abdomen soft and non-tender  Msk:  Back normal, normal gait. Normal strength and tone for age. Extremities: No clubbing, cyanosis or edema.   Neuro: Alert and oriented X 3. Psych:  Good affect, responds appropriately   LABS: Basic Metabolic Panel: Recent Labs    07/19/22 0443 07/20/22 0419 07/21/22 0654  NA  --  137 138  K 3.4* 3.7 3.8  CL  --  95* 98  CO2  --  32 32  GLUCOSE  --  108* 100*  BUN  --  39* 45*  CREATININE 2.14* 2.33* 2.29*  CALCIUM  --  8.9 8.9  MG 2.1 2.0  --    Liver Function Tests: No results for input(s): "AST", "ALT", "ALKPHOS", "BILITOT", "PROT", "ALBUMIN" in the last 72 hours. No results for input(s): "LIPASE", "AMYLASE" in the last 72 hours. CBC: Recent Labs    07/19/22 0443 07/21/22 0654  WBC 5.3 5.3  HGB 7.4* 7.3*  HCT 25.3* 23.9*  MCV 96.6 93.7  PLT 264 272   Cardiac Enzymes: No results for input(s): "CKTOTAL", "CKMB", "CKMBINDEX", "TROPONINI" in the last 72 hours. BNP: Invalid input(s): "POCBNP" D-Dimer: No results for input(s): "DDIMER" in the last 72 hours. Hemoglobin A1C: No results for input(s): "HGBA1C" in the last 72 hours. Fasting Lipid Panel: No results for input(s): "CHOL", "HDL", "LDLCALC", "TRIG", "CHOLHDL", "LDLDIRECT" in the last 72 hours. Thyroid  Function Tests: No results for input(s): "TSH", "T4TOTAL", "T3FREE", "THYROIDAB" in the last 72 hours.  Invalid input(s): "FREET3" Anemia Panel: Recent Labs    07/20/22 0419  FERRITIN 100  TIBC 297  IRON 23*    ECHOCARDIOGRAM COMPLETE  Result Date: 07/20/2022    ECHOCARDIOGRAM REPORT   Patient Name:   Rebecca Gilmore Date of Exam: 07/20/2022 Medical Rec #:  [id number]       Height:       64.0 in Accession #:    [id number]      Weight:       231.0 lb Date of Birth:  [birth year]        BSA:          2.080 m Patient Age:    82 years        BP:           107/49 mmHg Patient Gender: F               HR:           67 bpm. Exam Location:  [facility] Procedure: 2D Echo, Cardiac Doppler and Color Doppler Indications:     CHF--acute diastolic I50.31  History:         Patient  has prior history of Echocardiogram examinations, most                  recent 11/17/2021. CHF.  Sonographer:     Cristela Blue Referring Phys:  [id number] Andris Baumann Diagnosing Phys: Lorine Bears MD IMPRESSIONS  1. Left ventricular ejection fraction, by estimation, is 55 to 60%. The left ventricle has normal function. The left ventricle has no regional wall motion abnormalities. There is mild left ventricular hypertrophy. Left ventricular diastolic parameters are indeterminate.  2. Right ventricular systolic function is normal. The right ventricular size is normal. There is mildly elevated pulmonary artery systolic pressure.  3. Left atrial size was severely dilated.  4. The mitral valve is abnormal. No evidence of mitral valve regurgitation. Moderate mitral stenosis. The mean mitral valve gradient is 7.0 mmHg. Severe mitral annular calcification.  5. Tricuspid valve regurgitation is mild to moderate.  6. The aortic valve is calcified. Aortic valve regurgitation is trivial. Severe aortic valve stenosis. Aortic valve area, by VTI measures 0.82 cm. Aortic valve mean gradient measures 30.2 mmHg. FINDINGS  Left Ventricle: Left ventricular ejection  fraction, by estimation, is 55 to 60%. The left ventricle has normal function. The left ventricle has no regional wall motion abnormalities. The left ventricular internal cavity size was normal in size. There is  mild left ventricular hypertrophy. Left ventricular diastolic parameters are indeterminate. Right Ventricle: The right ventricular size is normal. No increase in right ventricular wall thickness. Right ventricular systolic function is normal. There is mildly elevated pulmonary artery systolic pressure. The tricuspid regurgitant velocity is 2.81  m/s, and with an assumed right atrial pressure of 5 mmHg, the estimated right ventricular systolic pressure is 36.6 mmHg. Left Atrium: Left atrial size was severely dilated. Right Atrium: Right atrial size was normal in size. Pericardium: There is no evidence of pericardial effusion. Mitral Valve: The mitral valve is abnormal. Severe mitral annular calcification. No evidence of mitral valve regurgitation. Moderate mitral valve stenosis. MV peak gradient, 21.0 mmHg. The mean mitral valve gradient is 7.0 mmHg. Tricuspid Valve: The tricuspid valve is normal in structure. Tricuspid valve regurgitation is mild to moderate. No evidence of tricuspid stenosis. Aortic Valve: The aortic valve is calcified. Aortic valve regurgitation is trivial. Severe aortic stenosis is present. Aortic valve mean gradient measures 30.2 mmHg. Aortic valve peak gradient measures 52.1 mmHg. Aortic valve area, by VTI measures 0.82 cm. Pulmonic Valve: The pulmonic valve was normal in structure. Pulmonic valve regurgitation is not visualized. No evidence of pulmonic stenosis. Aorta: The aortic root is normal in size and structure. Venous: The inferior vena cava was not well visualized. IAS/Shunts: No atrial level shunt detected by color flow Doppler.  LEFT VENTRICLE PLAX 2D LVIDd:         4.70 cm   Diastology LVIDs:         3.10 cm   LV e' medial:    8.92 cm/s LV PW:         1.50 cm   LV E/e'  medial:  21.0 LV IVS:        0.90 cm   LV e' lateral:   7.62 cm/s LVOT diam:     2.00 cm   LV E/e' lateral: 24.5 LV SV:         65 LV SV Index:   31 LVOT Area:     3.14 cm  RIGHT VENTRICLE RV Basal diam:  2.90 cm RV Mid diam:    3.40 cm RV S  prime:     10.20 cm/s TAPSE (M-mode): 1.5 cm LEFT ATRIUM            Index        RIGHT ATRIUM           Index LA diam:      5.10 cm  2.45 cm/m   RA Area:     15.30 cm LA Vol (A2C): 65.3 ml  31.40 ml/m  RA Volume:   33.20 ml  15.96 ml/m LA Vol (A4C): 116.0 ml 55.77 ml/m  AORTIC VALVE AV Area (Vmax):    0.75 cm AV Area (Vmean):   0.77 cm AV Area (VTI):     0.82 cm AV Vmax:           360.75 cm/s AV Vmean:          254.250 cm/s AV VTI:            0.788 m AV Peak Grad:      52.1 mmHg AV Mean Grad:      30.2 mmHg LVOT Vmax:         86.20 cm/s LVOT Vmean:        62.700 cm/s LVOT VTI:          0.207 m LVOT/AV VTI ratio: 0.26  AORTA Ao Root diam: 2.70 cm MITRAL VALVE                TRICUSPID VALVE MV Area (PHT): 3.39 cm     TR Peak grad:   31.6 mmHg MV Area VTI:   1.15 cm     TR Vmax:        281.00 cm/s MV Peak grad:  21.0 mmHg MV Mean grad:  7.0 mmHg     SHUNTS MV Vmax:       2.29 m/s     Systemic VTI:  0.21 m MV Vmean:      118.0 cm/s   Systemic Diam: 2.00 cm MV Decel Time: 224 msec MV E velocity: 187.00 cm/s MV A velocity: 98.50 cm/s MV E/A ratio:  1.90 Lorine Bears MD Electronically signed by Lorine Bears MD Signature Date/Time: 07/20/2022/5:37:11 PM    Final      Echo LVEF 55-60%, severe aortic stenosis, AVA 0.75 cm, peak velocity 3.61 m/s, peak gradient 52.1 mmHg, mean gradient 30.2 mmHg 07/20/2022  TELEMETRY: Atrial fibrillation 53 bpm:  ASSESSMENT AND PLAN:  Principal Problem:   Acute on chronic diastolic (congestive) heart failure (HCC) Active Problems:   Depression   Iron deficiency anemia   Acute renal failure superimposed on stage 3b chronic kidney disease (HCC)   Atrial fibrillation, chronic (HCC)   Nonrheumatic aortic valve stenosis    Hypokalemia   Morbid obesity with BMI of 40.0-44.9, adult (HCC)   History of recurrent GI bleed   Anemia of chronic renal failure, stage 3b (HCC)   Coronary artery disease   Elevated troponin   Hypotension   Acute on chronic diastolic CHF (congestive heart failure) (HCC)    1.  Acute on chronic HFpEF, with several week history of worsening exertional dyspnea, peripheral edema and weight gain, fracture to outpatient diuretics (torsemide 80 mg twice daily and metolazone 5 mg daily), with good initial diuresis after Bumex 1 mg IV, now on furosemide drip 2.  CAD, status post NSTEMI, cardiac catheterization at [facility] 02/04/2021 which revealed 80% stenosis ostial RCA with diffuse mid to distal disease, 50-60% stenosis proximal and mid LAD, treated medically, currently without chest pain, troponin 26, ECG nondiagnostic  3.  Severe aortic stenosis, 2D echocardiogram 07/20/2022 revealed LVEF 55-60%, AVA 0.75 cm, peak velocity 3.61 m/s, peak gradient 52.1 mmHg, mean gradient 30.2 mmHg gradient 52.8 mmHg, mean gradient 26.5 mmHg 4.  AKI/CKD 3b, BUN and creatinine 45 and 2.29, respectively 5.  Chronic atrial fibrillation, rate controlled, not on chronic anticoagulation, with history of upper GI bleed and chronic anemia 6.  Peripheral vascular disease, status post ileal endarterectomy and thrombectomy 11/16/2021 on clopidogrel  Recommendations  1.  Continue current medications 2.  Hold diuretics for now 3.  Carefully monitor renal status 4.  Continue Plavix 5.  Continue midodrine for low blood pressure 6.  Inpatient versus outpatient evaluation for TAVR   Marcina Millard, MD, PhD, [facility] 07/21/2022 4:34 PM

## 2022-07-21 NOTE — ED Notes (Signed)
Pt repositioned in bed and sat up to eat breakfast. Pt denies further needs at this time, WCTM. Call light in reach.

## 2022-07-22 DIAGNOSIS — I5033 Acute on chronic diastolic (congestive) heart failure: Secondary | ICD-10-CM | POA: Diagnosis not present

## 2022-07-22 DIAGNOSIS — N179 Acute kidney failure, unspecified: Secondary | ICD-10-CM | POA: Diagnosis not present

## 2022-07-22 DIAGNOSIS — I9589 Other hypotension: Secondary | ICD-10-CM | POA: Diagnosis not present

## 2022-07-22 LAB — CBC WITH DIFFERENTIAL/PLATELET
Abs Immature Granulocytes: 0.02 10*3/uL (ref 0.00–0.07)
Basophils Absolute: 0 10*3/uL (ref 0.0–0.1)
Basophils Relative: 1 %
Eosinophils Absolute: 0.1 10*3/uL (ref 0.0–0.5)
Eosinophils Relative: 1 %
HCT: 26.5 % — ABNORMAL LOW (ref 36.0–46.0)
Hemoglobin: 8 g/dL — ABNORMAL LOW (ref 12.0–15.0)
Immature Granulocytes: 0 %
Lymphocytes Relative: 25 %
Lymphs Abs: 1.5 10*3/uL (ref 0.7–4.0)
MCH: 28.2 pg (ref 26.0–34.0)
MCHC: 30.2 g/dL (ref 30.0–36.0)
MCV: 93.3 fL (ref 80.0–100.0)
Monocytes Absolute: 0.7 10*3/uL (ref 0.1–1.0)
Monocytes Relative: 12 %
Neutro Abs: 3.5 10*3/uL (ref 1.7–7.7)
Neutrophils Relative %: 61 %
Platelets: 294 10*3/uL (ref 150–400)
RBC: 2.84 MIL/uL — ABNORMAL LOW (ref 3.87–5.11)
RDW: 15.1 % (ref 11.5–15.5)
WBC: 5.7 10*3/uL (ref 4.0–10.5)
nRBC: 0 % (ref 0.0–0.2)

## 2022-07-22 LAB — BASIC METABOLIC PANEL
Anion gap: 9 (ref 5–15)
BUN: 42 mg/dL — ABNORMAL HIGH (ref 8–23)
CO2: 28 mmol/L (ref 22–32)
Calcium: 9.2 mg/dL (ref 8.9–10.3)
Chloride: 101 mmol/L (ref 98–111)
Creatinine, Ser: 2.19 mg/dL — ABNORMAL HIGH (ref 0.44–1.00)
GFR, Estimated: 22 mL/min — ABNORMAL LOW (ref >60)
Glucose, Bld: 97 mg/dL (ref 70–99)
Potassium: 4.2 mmol/L (ref 3.5–5.1)
Sodium: 138 mmol/L (ref 135–145)

## 2022-07-22 LAB — CORTISOL: Cortisol, Plasma: 10.6 ug/dL

## 2022-07-22 MED ORDER — TORSEMIDE 20 MG PO TABS: 20.0000 mg | ORAL_TABLET | Freq: Two times a day (BID) | ORAL | Status: DC

## 2022-07-22 MED ORDER — POTASSIUM CHLORIDE CRYS ER 20 MEQ PO TBCR: 60.0000 meq | EXTENDED_RELEASE_TABLET | Freq: Every day | ORAL | 3 refills | Status: DC

## 2022-07-22 MED ORDER — ORAL CARE MOUTH RINSE: 15.0000 mL | OROMUCOSAL | Status: DC | PRN

## 2022-07-22 MED ORDER — METOLAZONE 2.5 MG PO TABS: 2.5000 mg | ORAL_TABLET | Freq: Every day | ORAL | 0 refills | Status: DC

## 2022-07-22 MED ORDER — MIDODRINE HCL 10 MG PO TABS: 10.0000 mg | ORAL_TABLET | Freq: Three times a day (TID) | ORAL | 0 refills | Status: AC

## 2022-07-22 MED ORDER — TORSEMIDE 20 MG PO TABS: 80.0000 mg | ORAL_TABLET | Freq: Two times a day (BID) | ORAL | 3 refills | Status: DC

## 2022-07-22 MED FILL — Iron Sucrose Inj 20 MG/ML (Fe Equiv): INTRAVENOUS | Qty: 10 | Status: AC

## 2022-07-22 NOTE — Progress Notes (Signed)
[facility] Cardiology  Patient ID: Maleiya Mcclenton MRN: [id number] DOB/AGE: September 05, 1940 82 y.o.   Admit date: 07/19/2022 Referring Physician Myriam Forehand Primary Physician [facility] Primary Cardiologist Paraschos Reason for Consultation acute on chronic diastolic congestive heart failure   HPI: 82 year old female referred for evaluation of acute on chronic diastolic congestive heart failure (HFpEF).  The patient has known history of coronary artery disease, moderate aortic stenosis, chronic diastolic congestive heart failure (HFpEF).  She presents with several week history of exertional dyspnea, edema and weight gain.  She was seen as an outpatient at the congestive heart failure clinic on 07/15/2022 at which time she was continued on torsemide 80 mg twice daily and started on metolazone 5 mg daily without significant benefit.  Presents to [facility] emergency room chest x-ray revealed interstitial lung markings consistent with pulmonary edema.  Admission labs notable for AKI with underlying CKD with BUN and creatinine 37 and 2.15, respectively.  BNP was 209.3.  Hemoglobin hematocrit were 8.0 and 27.2, respectively.  The patient was treated with Bumex 1 mg IV with initial diuresis and overall clinical improvement.  She was started on furosemide drip.   The patient has a history of NSTEMI, underwent cardiac catheterization at [facility] 03/06/2021 which revealed 80% stenosis ostial RCA, diffuse stenosis mid to distal RCA, 50-60% stenosis proximal/mid LAD, treated medically.  D echocardiogram 11/17/2021 revealed LVEF 65-70% with moderate aortic stenosis (AVA 0.98 cm, peak velocity 3.63 m/s, peak gradient 52.8 mmHg, mean gradient 26.5 mmHg.  She has known peripheral vascular disease status post left ileal endarterectomy and thrombectomy 11/16/2021.  History of upper GI bleed with chronic anemia.  She has chronic kidney disease stage IIIb.  Interval History:  -Seen and examined this morning, denies chest pain, lightheadedness or dizziness,  peripheral edema stable and overall improved. -Daughter notes patient eats "what ever she wants."  Eats lots of cheese, does not add salt to foods but does not check salt content of which she consumes   Vitals:   07/21/22 2043 07/21/22 2044 07/21/22 2313 07/22/22 0334  BP: (!) 98/37 (!) 100/48 (!) 91/40 (!) 93/35  Pulse: 64  66 63  Resp:   20 18  Temp:   97.9 F (36.6 C) 98 F (36.7 C)  TempSrc:   Oral Oral  SpO2:   92% 91%  Weight:      Height:         Intake/Output Summary (Last 24 hours) at 07/22/2022 0809 Last data filed at 07/21/2022 1732 Gross per 24 hour  Intake 430.57 ml  Output --  Net 430.57 ml      PHYSICAL EXAM  General: Elderly chronically ill-appearing Caucasian female, sitting upright in bed. HEENT:  Normocephalic and atraumatic Neck:   No JVD.  Lungs: Decreased breath sounds bilaterally without appreciable crackles or wheezes. Heart: Irregularly irregular with controlled rate. Normal S1 and S2, 3/6 systolic murmur heard throughout.   Abdomen: Mildly distended with excess adiposity. Msk:  Normal strength and tone for age. Extremities: Bilateral lower extremities with trace bilateral lower extremity edema with some skin dimpling and wrinkling Neuro: Alert and oriented X 3. Psych:  Good affect, responds appropriately   LABS: Basic Metabolic Panel: Recent Labs    07/20/22 0419 07/21/22 0654 07/22/22 0615  NA 137 138 138  K 3.7 3.8 4.2  CL 95* 98 101  CO2 32 32 28  GLUCOSE 108* 100* 97  BUN 39* 45* 42*  CREATININE 2.33* 2.29* 2.19*  CALCIUM 8.9 8.9 9.2  MG 2.0  --   --  Liver Function Tests: No results for input(s): "AST", "ALT", "ALKPHOS", "BILITOT", "PROT", "ALBUMIN" in the last 72 hours. No results for input(s): "LIPASE", "AMYLASE" in the last 72 hours. CBC: Recent Labs    07/21/22 0654 07/22/22 0615  WBC 5.3 5.7  NEUTROABS  --  3.5  HGB 7.3* 8.0*  HCT 23.9* 26.5*  MCV 93.7 93.3  PLT 272 294    Cardiac Enzymes: No results  for input(s): "CKTOTAL", "CKMB", "CKMBINDEX", "TROPONINI" in the last 72 hours. BNP: Invalid input(s): "POCBNP" D-Dimer: No results for input(s): "DDIMER" in the last 72 hours. Hemoglobin A1C: No results for input(s): "HGBA1C" in the last 72 hours. Fasting Lipid Panel: No results for input(s): "CHOL", "HDL", "LDLCALC", "TRIG", "CHOLHDL", "LDLDIRECT" in the last 72 hours. Thyroid Function Tests: No results for input(s): "TSH", "T4TOTAL", "T3FREE", "THYROIDAB" in the last 72 hours.  Invalid input(s): "FREET3" Anemia Panel: Recent Labs    07/20/22 0419  FERRITIN 100  TIBC 297  IRON 23*     ECHOCARDIOGRAM COMPLETE  Result Date: 07/20/2022    ECHOCARDIOGRAM REPORT   Patient Name:   Alvino Chapel [facility] Date of Exam: 07/20/2022 Medical Rec #:  [id number]       Height:       64.0 in Accession #:    [id number]      Weight:       231.0 lb Date of Birth:  06-02-1940        BSA:          2.080 m Patient Age:    82 years        BP:           107/49 mmHg Patient Gender: F               HR:           67 bpm. Exam Location:  [facility] Procedure: 2D Echo, Cardiac Doppler and Color Doppler Indications:     CHF--acute diastolic I50.31  History:         Patient has prior history of Echocardiogram examinations, most                  recent 11/17/2021. CHF.  Sonographer:     Cristela Blue Referring Phys:  [id number] Andris Baumann Diagnosing Phys: Lorine Bears MD IMPRESSIONS  1. Left ventricular ejection fraction, by estimation, is 55 to 60%. The left ventricle has normal function. The left ventricle has no regional wall motion abnormalities. There is mild left ventricular hypertrophy. Left ventricular diastolic parameters are indeterminate.  2. Right ventricular systolic function is normal. The right ventricular size is normal. There is mildly elevated pulmonary artery systolic pressure.  3. Left atrial size was severely dilated.  4. The mitral valve is abnormal. No evidence of mitral valve regurgitation. Moderate mitral  stenosis. The mean mitral valve gradient is 7.0 mmHg. Severe mitral annular calcification.  5. Tricuspid valve regurgitation is mild to moderate.  6. The aortic valve is calcified. Aortic valve regurgitation is trivial. Severe aortic valve stenosis. Aortic valve area, by VTI measures 0.82 cm. Aortic valve mean gradient measures 30.2 mmHg. FINDINGS  Left Ventricle: Left ventricular ejection fraction, by estimation, is 55 to 60%. The left ventricle has normal function. The left ventricle has no regional wall motion abnormalities. The left ventricular internal cavity size was normal in size. There is  mild left ventricular hypertrophy. Left ventricular diastolic parameters are indeterminate. Right Ventricle: The right ventricular size is normal. No increase in right ventricular wall thickness.  Right ventricular systolic function is normal. There is mildly elevated pulmonary artery systolic pressure. The tricuspid regurgitant velocity is 2.81  m/s, and with an assumed right atrial pressure of 5 mmHg, the estimated right ventricular systolic pressure is 36.6 mmHg. Left Atrium: Left atrial size was severely dilated. Right Atrium: Right atrial size was normal in size. Pericardium: There is no evidence of pericardial effusion. Mitral Valve: The mitral valve is abnormal. Severe mitral annular calcification. No evidence of mitral valve regurgitation. Moderate mitral valve stenosis. MV peak gradient, 21.0 mmHg. The mean mitral valve gradient is 7.0 mmHg. Tricuspid Valve: The tricuspid valve is normal in structure. Tricuspid valve regurgitation is mild to moderate. No evidence of tricuspid stenosis. Aortic Valve: The aortic valve is calcified. Aortic valve regurgitation is trivial. Severe aortic stenosis is present. Aortic valve mean gradient measures 30.2 mmHg. Aortic valve peak gradient measures 52.1 mmHg. Aortic valve area, by VTI measures 0.82 cm. Pulmonic Valve: The pulmonic valve was normal in structure. Pulmonic valve  regurgitation is not visualized. No evidence of pulmonic stenosis. Aorta: The aortic root is normal in size and structure. Venous: The inferior vena cava was not well visualized. IAS/Shunts: No atrial level shunt detected by color flow Doppler.  LEFT VENTRICLE PLAX 2D LVIDd:         4.70 cm   Diastology LVIDs:         3.10 cm   LV e' medial:    8.92 cm/s LV PW:         1.50 cm   LV E/e' medial:  21.0 LV IVS:        0.90 cm   LV e' lateral:   7.62 cm/s LVOT diam:     2.00 cm   LV E/e' lateral: 24.5 LV SV:         65 LV SV Index:   31 LVOT Area:     3.14 cm  RIGHT VENTRICLE RV Basal diam:  2.90 cm RV Mid diam:    3.40 cm RV S prime:     10.20 cm/s TAPSE (M-mode): 1.5 cm LEFT ATRIUM            Index        RIGHT ATRIUM           Index LA diam:      5.10 cm  2.45 cm/m   RA Area:     15.30 cm LA Vol (A2C): 65.3 ml  31.40 ml/m  RA Volume:   33.20 ml  15.96 ml/m LA Vol (A4C): 116.0 ml 55.77 ml/m  AORTIC VALVE AV Area (Vmax):    0.75 cm AV Area (Vmean):   0.77 cm AV Area (VTI):     0.82 cm AV Vmax:           360.75 cm/s AV Vmean:          254.250 cm/s AV VTI:            0.788 m AV Peak Grad:      52.1 mmHg AV Mean Grad:      30.2 mmHg LVOT Vmax:         86.20 cm/s LVOT Vmean:        62.700 cm/s LVOT VTI:          0.207 m LVOT/AV VTI ratio: 0.26  AORTA Ao Root diam: 2.70 cm MITRAL VALVE                TRICUSPID VALVE MV Area (PHT): 3.39 cm  TR Peak grad:   31.6 mmHg MV Area VTI:   1.15 cm     TR Vmax:        281.00 cm/s MV Peak grad:  21.0 mmHg MV Mean grad:  7.0 mmHg     SHUNTS MV Vmax:       2.29 m/s     Systemic VTI:  0.21 m MV Vmean:      118.0 cm/s   Systemic Diam: 2.00 cm MV Decel Time: 224 msec MV E velocity: 187.00 cm/s MV A velocity: 98.50 cm/s MV E/A ratio:  1.90 Lorine Bears MD Electronically signed by Lorine Bears MD Signature Date/Time: 07/20/2022/5:37:11 PM    Final      Echo LVEF 55-60%, severe aortic stenosis, AVA 0.75 cm, peak velocity 3.61 m/s, peak gradient 52.1 mmHg, mean gradient  30.2 mmHg 07/20/2022  TELEMETRY: Atrial fibrillation 49-60 bpm:  ASSESSMENT AND PLAN:  Principal Problem:   Acute on chronic diastolic (congestive) heart failure (HCC) Active Problems:   Depression   Iron deficiency anemia   Acute renal failure superimposed on stage 3b chronic kidney disease (HCC)   Atrial fibrillation, chronic (HCC)   Nonrheumatic aortic valve stenosis   Hypokalemia   Morbid obesity with BMI of 40.0-44.9, adult (HCC)   History of recurrent GI bleed   Anemia of chronic renal failure, stage 3b (HCC)   Coronary artery disease   Elevated troponin   Hypotension   Acute on chronic diastolic CHF (congestive heart failure) (HCC)    1.  Acute on chronic HFpEF, with several week history of worsening exertional dyspnea, peripheral edema and weight gain, fracture to outpatient diuretics (torsemide 80 mg twice daily and metolazone 5 mg daily), with good initial diuresis after Bumex 1 mg IV, now on furosemide drip 2.  CAD, status post NSTEMI, cardiac catheterization at [facility] 02/04/2021 which revealed 80% stenosis ostial RCA with diffuse mid to distal disease, 50-60% stenosis proximal and mid LAD, treated medically, currently without chest pain, troponin 26, ECG nondiagnostic 3.  Severe aortic stenosis, 2D echocardiogram 07/20/2022 revealed LVEF 55-60%, AVA 0.75 cm, peak velocity 3.61 m/s, peak gradient 52.1 mmHg, mean gradient 30.2 mmHg gradient 52.8 mmHg, mean gradient 26.5 mmHg.  4.  AKI/CKD 3b, BUN and creatinine 45 and 2.29, respectively 5.  Chronic atrial fibrillation, rate controlled, not on chronic anticoagulation, with history of upper GI bleed and chronic anemia 6.  Peripheral vascular disease, status post ileal endarterectomy and thrombectomy 11/16/2021 on clopidogrel 7.  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   Recommendations  1.  Continue current medications 2.   Resume torsemide 20 mg twice daily at discharge 3.  Carefully monitor renal status 4.  Continue Plavix 5.  Continue midodrine for low blood pressure 6.  Extensive discussion with the patient, the patient's husband, and her daughter at bedside this afternoon regarding definitive management of her severe aortic stenosis and recurrent heart failure exacerbations.  She has an appointment scheduled for Tuesday, 5/21 at 2:30 PM with [facility] clinic heart failure for further workup and recommendations, and likely start TAVR eval.    Ok for discharge today from a cardiac perspective.   This patient's plan of care was discussed and created with Dr. Darrold Junker and he is in agreement.    Rebeca Allegra, PA-C 07/22/2022 8:09 AM

## 2022-07-22 NOTE — TOC Transition Note (Signed)
Transition of Care [facility]) - CM/SW Discharge Note   Patient Details  Name: Rebecca Gilmore MRN: [id number] Date of Birth: Aug 18, 1940  [facility] CM/SW Contact:  Truddie Hidden, RN Phone Number: 07/22/2022, 3:03 PM   Clinical Narrative:    Spoke with patient regarding discharge plan.  Patient is discharging home She is agreeable to [facility].  She was previously active with HH via [place].  Referral sent and received by Kandee Keen at [place] Her daughter will transport her home.   TOC signing off.          Patient Goals and CMS Choice      Discharge Placement                         Discharge Plan and Services Additional resources added to the After Visit Summary for                                       Social Determinants of Health (SDOH) Interventions SDOH Screenings   Food Insecurity: No Food Insecurity (02/18/2022)  Housing: Low Risk  (02/18/2022)  Transportation Needs: No Transportation Needs (02/18/2022)  Utilities: Not At Risk (02/18/2022)  Tobacco Use: Medium Risk (07/19/2022)     Readmission Risk Interventions     No data to display

## 2022-07-22 NOTE — Evaluation (Signed)
Physical Therapy Evaluation Patient Details Name: Rebecca Gilmore MRN: [id number] DOB: 1940/11/30 Today's Date: 07/22/2022  History of Present Illness  Rebecca Gilmore is a 82 y.o. 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   Clinical Impression  Pt admitted with above diagnosis. Pt currently with functional limitations due to the deficits listed below (see PT Problem List). Pt received upright in bed agreeable to PT. Reports at baseline she is mod-I with a RW now due to previous LLE vascular procedure leading to difficulty ambulating now. But has maintained independence in ADL's and gets support from family for IADL's PRN.   To date, pt exits L side of bed at supervision level with HOB elevated and increased time (pt sleeps in a lift chair at baseline). Standing at minguard to RW and ambulating ~15' in room at supervision level. Pt overall demo'ing antalgic gait pattern on RLE due to LE fluid but otherwise reports near baseline gait otherwise denying further weakness. Pt sitting in recliner with safe sequencing without SOB and SPO2 and HR WNL post gait. Pt receiving HH PT services prior to admission and would like to return to [facility] PT. Pt with all needs in reach. Will benefit from skilled PT services to address gait and LE weakness deficits to optimize return to PLOF.       Recommendations for follow up therapy are one component of a multi-disciplinary discharge planning process, led by the attending physician.  Recommendations may be updated based on patient status, additional functional criteria and insurance  authorization.     Assistance Recommended at Discharge Intermittent Supervision/Assistance  Patient can return home with the following  A little help with walking and/or transfers;A little help with bathing/dressing/bathroom;Assist for transportation;Assistance with cooking/housework;Help with stairs or ramp for entrance    Equipment Recommendations None recommended by PT  Recommendations for Other Services       Functional Status Assessment Patient has had a recent decline in their functional status and demonstrates the ability to make significant improvements in function in a reasonable and predictable amount of time.     Precautions / Restrictions Precautions Precautions: Fall Restrictions Weight Bearing Restrictions: No      Mobility  Bed Mobility Overal bed mobility: Needs Assistance Bed Mobility: Supine to Sit     Supine to sit: HOB elevated, Supervision       Patient Response: Cooperative  Transfers Overall transfer level: Needs assistance Equipment used: Rolling walker (2 wheels) Transfers: Sit to/from Stand Sit to Stand: Min guard                Ambulation/Gait Ambulation/Gait assistance: Supervision Gait Distance (Feet): 15 Feet Assistive device: Rolling walker (2 wheels) Gait Pattern/deviations: Step-to pattern, Decreased step length - right, Decreased step length - left, Antalgic       General Gait Details: mildly antalgic gait on RLE in stance phase but safe use of RW with short household distances.  Stairs            Wheelchair Mobility    Modified Rankin (Stroke Patients Only)       Balance Overall balance assessment: Needs assistance Sitting-balance support: No upper extremity  supported, Feet supported Sitting balance-Leahy Scale: Fair     Standing balance support: Bilateral upper extremity supported Standing balance-Leahy Scale: Fair                               Pertinent Vitals/Pain Pain Assessment Pain  Assessment: Faces Faces Pain Scale: Hurts little more Pain Location: R foot Pain Descriptors / Indicators: Grimacing Pain Intervention(s): Limited activity within patient's tolerance, Monitored during session, Repositioned    Home Living Family/patient expects to be discharged to:: Private residence Living Arrangements: Spouse/significant other Available Help at Discharge: Family;Available 24 hours/day;Available PRN/intermittently (husband and grandson) Type of Home: House Home Access: Stairs to enter Entrance Stairs-Rails: None Entrance Stairs-Number of Steps: 2   Home Layout: One level Home Equipment: Agricultural consultant (2 wheels);Rollator (4 wheels);BSC/3in1;Shower seat - built in;Grab bars - tub/shower;Toilet riser Additional Comments: Live with spouse, grandson next door    Prior Function Prior Level of Function : Independent/Modified Independent             Mobility Comments: utilizing RW ADLs Comments: Independent with ADL's     Hand Dominance        Extremity/Trunk Assessment   Upper Extremity Assessment Upper Extremity Assessment: Defer to OT evaluation    Lower Extremity Assessment Lower Extremity Assessment: Generalized weakness    Cervical / Trunk Assessment Cervical / Trunk Assessment: Normal  Communication   Communication: No difficulties  Cognition Arousal/Alertness: Awake/alert Behavior During Therapy: WFL for tasks assessed/performed Overall Cognitive Status: Within Functional Limits for tasks assessed                                          General Comments      Exercises Other Exercises Other Exercises: Role of PT in acute setting, d/c recs   Assessment/Plan    PT Assessment Patient needs continued PT services  PT Problem List Decreased strength;Decreased activity tolerance;Decreased balance;Pain       PT Treatment Interventions DME instruction;Therapeutic activities;Gait training;Therapeutic exercise;Patient/family  education;Stair training;Balance training;Functional mobility training;Neuromuscular re-education    PT Goals (Current goals can be found in the Care Plan section)  Acute Rehab PT Goals Patient Stated Goal: to go home PT Goal Formulation: With patient Time For Goal Achievement: 08/05/22 Potential to Achieve Goals: Good    Frequency Min 2X/week     Co-evaluation               AM-PAC PT "6 Clicks" Mobility  Outcome Measure Help needed turning from your back to your side while in a flat bed without using bedrails?: A Little Help needed moving from lying on your back to sitting on the side of a flat bed without using bedrails?: A Little Help needed moving to and from a bed to a chair (including a wheelchair)?: A Little Help needed standing up from a chair using your arms (e.g., wheelchair or bedside chair)?: A Little Help needed to walk in hospital room?: A Little Help needed climbing 3-5 steps with a railing? : A Lot 6 Click Score: 17    End of Session Equipment Utilized During Treatment: Gait belt Activity Tolerance: Patient tolerated treatment well Patient left: in chair;with call bell/phone within reach;with chair alarm set Nurse Communication: Mobility status PT Visit Diagnosis: Other abnormalities of gait and mobility (R26.89);Muscle weakness (generalized) (M62.81);Difficulty in walking, not elsewhere classified (R26.2)  Time: 1610-9604 PT Time Calculation (min) (ACUTE ONLY): 25 min   Charges:     PT Treatments $Therapeutic Activity: 8-22 mins        Edgard Debord M. Fairly IV, PT, DPT Physical Therapist- [place]  [facility]  07/22/2022, 12:00 PM

## 2022-07-22 NOTE — Evaluation (Signed)
Occupational Therapy Evaluation Patient Details Name: Rebecca Gilmore MRN: [id number] DOB: 06-10-1940 Today's Date: 07/22/2022   History of Present Illness Allyanna Florek is a 82 y.o. 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   Clinical Impression   Patient agreeable to OT evaluation, family present. Pt presenting with decreased independence in self care, balance, functional mobility/transfers, and endurance. At baseline, pt receives assistance for LB dressing and transportation. She was Mod I for functional mobility using a RW. Pt lives with her husband and has a grandson next door who can provide assistance as needed upon discharge. Pt currently functioning at Min A for bed mobility and CGA for BSC transfer using a RW. Pt endorsed pain in B feet with activity (reports h/o neuropathy). Pt will benefit from skilled acute OT services to address deficits noted below. OT recommends ongoing therapy upon discharge to maximize safety and independence with ADLs, decrease fall risk, decrease caregiver burden, and promote return to PLOF.       Recommendations for follow up therapy are one component of a multi-disciplinary discharge planning process, led by the attending physician.  Recommendations may be updated based on patient status, additional functional criteria and insurance authorization.   Assistance Recommended at Discharge Intermittent Supervision/Assistance  Patient can return home with the following A little help with walking and/or transfers;A little help with bathing/dressing/bathroom;Assistance with  cooking/housework;Assist for transportation;Help with stairs or ramp for entrance    Functional Status Assessment  Patient has had a recent decline in their functional status and demonstrates the ability to make significant improvements in function in a reasonable and predictable amount of time.  Equipment Recommendations  None recommended by OT    Recommendations for Other Services       Precautions / Restrictions Precautions Precautions: Fall Restrictions Weight Bearing Restrictions: No      Mobility Bed Mobility Overal bed mobility: Needs Assistance Bed Mobility: Supine to Sit     Supine to sit: Min assist, HOB elevated     General bed mobility comments: assist for trunk elevation    Transfers Overall transfer level: Needs assistance Equipment used: Rolling walker (2 wheels) Transfers: Sit to/from Stand, Bed to chair/wheelchair/BSC Sit to Stand: Min guard     Step pivot transfers: Min guard            Balance Overall balance assessment: Needs assistance Sitting-balance support: No upper extremity supported, Feet supported Sitting balance-Leahy Scale: Fair     Standing balance support: Bilateral upper extremity supported Standing balance-Leahy Scale: Fair         ADL either performed or assessed with clinical judgement   ADL Overall ADL's : Needs assistance/impaired     Grooming: Set up;Sitting                   Toilet Transfer: BSC/3in1;Rolling walker (2 wheels);Min guard             General ADL Comments: Once getting to [facility], pt having BM and requesting privacy. Left call bell within reach and notified RN/NT.     Vision Baseline Vision/History: 1 Wears glasses (readers only) Patient Visual Report: No change from baseline  Perception     Praxis      Pertinent Vitals/Pain Pain Assessment Pain Assessment: Faces Faces Pain Scale: Hurts little more Pain Location: B feet (neuropathy) Pain Descriptors / Indicators:  Grimacing Pain Intervention(s): Limited activity within patient's tolerance, Monitored during session, Repositioned     Hand Dominance     Extremity/Trunk Assessment Upper Extremity Assessment Upper Extremity Assessment: Generalized weakness   Lower Extremity Assessment Lower Extremity Assessment: Generalized weakness;RLE deficits/detail;LLE deficits/detail RLE Sensation: history of peripheral neuropathy LLE Sensation: history of peripheral neuropathy   Cervical / Trunk Assessment Cervical / Trunk Assessment: Normal   Communication Communication Communication: No difficulties   Cognition Arousal/Alertness: Awake/alert Behavior During Therapy: WFL for tasks assessed/performed Overall Cognitive Status: Within Functional Limits for tasks assessed         General Comments       Exercises Other Exercises Other Exercises: OT provided education re: role of OT, OT POC, post acute recs, sitting up for all meals, EOB/OOB mobility with assistance, home/fall safety.     Shoulder Instructions      Home Living Family/patient expects to be discharged to:: Private residence Living Arrangements: Spouse/significant other Available Help at Discharge: Family;Available 24 hours/day;Available PRN/intermittently (husband and grandson) Type of Home: House Home Access: Stairs to enter [employer] of Steps: 2 Entrance Stairs-Rails: None Home Layout: One level     Bathroom Shower/Tub: Producer, television/film/video: Standard     Home Equipment: Agricultural consultant (2 wheels);Rollator (4 wheels);BSC/3in1;Shower seat - built in;Grab bars - tub/shower;Toilet riser   Additional Comments: Live with spouse, grandson next door      Prior Functioning/Environment Prior Level of Function : Independent/Modified Independent             Mobility Comments: Mod I using RW ADLs Comments: Grossly Mod I for ADLs (husband assists with LB dressing), assist for transportation         OT Problem List: Decreased strength;Decreased activity tolerance;Impaired balance (sitting and/or standing);Obesity;Pain;Impaired sensation      OT Treatment/Interventions: Self-care/ADL training;Therapeutic exercise;Energy conservation;DME and/or AE instruction;Therapeutic activities;Patient/family education;Balance training    OT Goals(Current goals can be found in the care plan section) Acute Rehab OT Goals Patient Stated Goal: return home OT Goal Formulation: With patient/family Time For Goal Achievement: 08/05/22 Potential to Achieve Goals: Good   OT Frequency: Min 1X/week    Co-evaluation              AM-PAC OT "6 Clicks" Daily Activity     Outcome Measure Help from another person eating meals?: None Help from another person taking care of personal grooming?: A Little Help from another person toileting, which includes using toliet, bedpan, or urinal?: A Little Help from another person bathing (including washing, rinsing, drying)?: A Lot Help from another person to put on and taking off regular upper body clothing?: None Help from another person to put on and taking off regular lower body clothing?: A Lot 6 Click Score: 18   End of Session Equipment Utilized During Treatment: Gait belt;Rolling walker (2 wheels) Nurse Communication: Mobility status;Other (comment) (pt on BSC)  Activity Tolerance: Patient tolerated treatment well;Patient limited by pain Patient left: Other (comment);with call bell/phone within reach (on [facility])  OT Visit Diagnosis: Other abnormalities of gait and mobility (R26.89);Muscle weakness (generalized) (M62.81);Pain Pain - Right/Left:  (bilateral) Pain - part of body: Ankle and joints of foot                Time: 1610-9604 OT Time Calculation (min):  17 min Charges:  OT General Charges $OT Visit: 1 Visit OT Evaluation $OT Eval Low Complexity: 1 Low  [facility] MS, OTR/L ascom [phone]  07/22/22, 5:32 PM

## 2022-07-22 NOTE — Discharge Summary (Signed)
Physician Discharge Summary  Rebecca Gilmore ZOX:[id number] DOB: 1941/01/17 DOA: 07/19/2022  PCP: Ailene Ravel, MD  Admit date: 07/19/2022 Discharge date: 07/22/2022  Admitted From: home  Disposition:  home w/ home health   Recommendations for Outpatient Follow-up:  Follow up with PCP in 1-2 weeks F/u cardio, Dr. Edwena Blow [facility] heart failure) on Tuesday 5/21 at 2:30pm   Home Health: yes Equipment/Devices:  Discharge Condition: stable  CODE STATUS: full  Diet recommendation: Heart Healthy  Brief/Interim Summary: HPI was taken from Dr. Para March:  Rebecca Gilmore is a 82 y.o. 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  Patient was seen by her cardiologist on 5/8 with a similar complaint and her torsemide was increased to 80 mg twice daily and metolazone was added.  It was felt that her aortic stenosis might be contributing to her worsening symptoms and she has an upcoming repeat echo.  She states that and spite of the medication adjustment and compliance with GDMT, she continues to retain fluid and have dyspnea on exertion.  She denies chest pain, cough fever or chills. ED course and data review: Soft blood pressures, down to 90/44 after diuretic and mildly tachycardic at 105 on arrival.  Labs with troponin of 26 and BNP 206.  Potassium 3.3, creatinine 2.15, up from baseline of 1.68.  Hemoglobin 8.0 improved from most recent of 7.3. EKG pending Chest x-ray shows chronic appearing interstitial lung markings without evidence of active cardiopulmonary disease. Patient received IV Bumex 1 mg and oral  potassium repletion. Hospitalist consulted for admission.    As per Dr. Myriam Forehand: She was admitted to the hospital for acute exacerbation of chronic diastolic CHF. She was initially treated with IV Bumex and subsequently transition to IV Lasix infusion. She developed hypotension and AKI (worsening renal function) Lasix drip was discontinued.   As per Dr. Mayford Knife 07/22/22: Pt was medically stable for d/c. Pt was d/c home w/ home health.   Discharge Diagnoses:  Principal Problem:   Acute on chronic diastolic (congestive) heart failure (HCC) Active Problems:   Hypotension   Nonrheumatic aortic valve stenosis   Coronary artery disease   Elevated troponin   Iron deficiency anemia   Acute renal failure superimposed on stage 3b chronic kidney disease (HCC)   History of recurrent GI bleed   Anemia of chronic renal failure, stage 3b (HCC)   Atrial fibrillation, chronic (HCC)   Hypokalemia   Depression   Morbid obesity with BMI of 40.0-44.9, adult (HCC)   Acute on chronic diastolic CHF (congestive heart failure) (HCC)  Acute on chronic diastolic CHF: holding lasix as per cardio. Monitor I/Os. Echo showed EF estimated at 55 to 60%, mild LVH, indeterminate LV diastolic parameters, severely dilated left atrium, mild to moderate TR, moderate mitral stenosis, severe aortic stenosis.    AKI on CKDIIIb: holding lasix.  Creatinine is starting to trend down slowly.    Persistent hypotension: started on midodrine on 07/20/22 & will continue. Keep MAP >65   Hypokalemia: WNL today    ACD: likely secondary to CKD. Negative bone marrow biopsy in December 2023.  She receives Venofer as an outpatient.  S/p  IV iron x 1. Extensive GI workup including EGD, colonoscopy and capsule endoscopy were unremarkable.   Hx of CAD: w/ mildly elevated troponins. Mildly elevated troponins likely from demand ischemia.  Continue statin, plavix    PAF: not on long-term anticoagulation because of concern for high bleeding  risk.  Discharge Instructions  Discharge Instructions     Diet - low sodium heart healthy   Complete by: As directed    Discharge instructions   Complete by: As directed    F/u w/ PCP in 1-2 weeks. F/u cardio, Dr. Edwena Blow [facility] heart failure) on Tuesday 5/21 at 2:30pm. Hold/do not take home dose of metolazone, torsemide & potassium until you see your cardiologist   Increase activity slowly   Complete by: As directed       Allergies as of 07/22/2022       Reactions   Morphine Hives, Itching   Other Reaction(s): Confusion   Other Hives, Itching, Other (See Comments)   Other Reaction(s): Confusion   Morphine And Codeine Itching   Kenalog [triamcinolone Acetonide] Other (See Comments)   FLUSH IN FACE   Penicillins Other (See Comments)   Has patient had a PCN reaction causing immediate rash, facial/tongue/throat swelling, SOB or lightheadedness with hypotension: Unknown Has patient had a PCN reaction causing severe rash involving mucus membranes or skin necrosis: Unknown Has patient had a PCN reaction that required hospitalization:No Has patient had a PCN reaction occurring within the last 10 years: No If all of the above answers are "NO", then may proceed with Cephalosporin use.   Latex Rash   Povidone-iodine Rash   burning        Medication List     TAKE these medications    acetaminophen 325 MG tablet Commonly known as: TYLENOL Take 2 tablets (650 mg total) by mouth every 6 (six) hours as needed for mild pain or fever.   amitriptyline 25 MG tablet Commonly known as: ELAVIL Take 25 mg by mouth at bedtime as needed.   cholecalciferol 25 MCG (1000 UNIT) tablet Commonly known as: VITAMIN D3 Take 1,000 Units by mouth daily.   clopidogrel 75 MG tablet Commonly known as: PLAVIX Take 75 mg by mouth every other day.   cyanocobalamin 1000 MCG tablet Commonly known as: VITAMIN B12 Take 1,000 mcg by mouth daily.   dapagliflozin propanediol 10 MG Tabs tablet Commonly  known as: Farxiga Take 1 tablet (10 mg total) by mouth daily.   Iron 325 (65 Fe) MG Tabs Take 1 tablet by mouth daily.   loratadine 10 MG tablet Commonly known as: CLARITIN Take 10 mg by mouth daily as needed.   metolazone 2.5 MG tablet Commonly known as: ZAROXOLYN Take 1 tablet (2.5 mg total) by mouth daily. Hold this medication until you see your cardiologist What changed: additional instructions   midodrine 10 MG tablet Commonly known as: PROAMATINE Take 1 tablet (10 mg total) by mouth 3 (three) times daily with meals for 14 days.   pantoprazole 40 MG tablet Commonly known as: PROTONIX Take 40 mg by mouth at bedtime.   potassium chloride SA 20 MEQ tablet Commonly known as: KLOR-CON M Take 3 tablets (60 mEq total) by mouth daily. Take an extra 2 tablets on your metolazone days. Hold this medication until you see your cardiologist What changed: additional instructions   pregabalin 75 MG capsule Commonly known as: Lyrica Take 1 capsule (75 mg total) by mouth daily.   rosuvastatin 5 MG tablet Commonly known as: CRESTOR Take 5  mg by mouth at bedtime.   sertraline 50 MG tablet Commonly known as: ZOLOFT Take 50 mg by mouth daily.   torsemide 20 MG tablet Commonly known as: DEMADEX Take 4 tablets (80 mg total) by mouth 2 (two) times daily. Hold this medication until you see your cardiologist What changed: additional instructions        Follow-up Information     [facility] Go in 4 day(s).   Why: Appointment with [facility] heart failure on Tuesday, 5/21 at 2:30pm Contact information: [address] [place] [address] [phone]                Allergies  Allergen Reactions   Morphine Hives and Itching    Other Reaction(s): Confusion   Other Hives, Itching and Other (See Comments)    Other Reaction(s): Confusion   Morphine And Codeine Itching   Kenalog [Triamcinolone Acetonide] Other (See Comments)    FLUSH IN FACE    Penicillins Other (See Comments)    Has patient had a PCN reaction causing immediate rash, facial/tongue/throat swelling, SOB or lightheadedness with hypotension: Unknown Has patient had a PCN reaction causing severe rash involving mucus membranes or skin necrosis: Unknown Has patient had a PCN reaction that required hospitalization:No Has patient had a PCN reaction occurring within the last 10 years: No If all of the above answers are "NO", then may proceed with Cephalosporin use.    Latex Rash   Povidone-Iodine Rash    burning    Consultations: Cardio    Procedures/Studies: ECHOCARDIOGRAM COMPLETE  Result Date: 07/20/2022    ECHOCARDIOGRAM REPORT   Patient Name:   Rebecca Gilmore Date of Exam: 07/20/2022 Medical Rec #:  [id number]       Height:       64.0 in Accession #:    [id number]      Weight:       231.0 lb Date of Birth:  11-30-1940        BSA:          2.080 m Patient Age:    82 years        BP:           107/49 mmHg Patient Gender: F               HR:           67 bpm. Exam Location:  [facility] Procedure: 2D Echo, Cardiac Doppler and Color Doppler Indications:     CHF--acute diastolic I50.31  History:         Patient has prior history of Echocardiogram examinations, most                  recent 11/17/2021. CHF.  Sonographer:     Cristela Blue Referring Phys:  [id number] Andris Baumann Diagnosing Phys: Lorine Bears MD IMPRESSIONS  1. Left ventricular ejection fraction, by estimation, is 55 to 60%. The left ventricle has normal function. The left ventricle has no regional wall motion abnormalities. There is mild left ventricular hypertrophy. Left ventricular diastolic parameters are indeterminate.  2. Right ventricular systolic function is normal. The right ventricular size is normal. There is mildly elevated pulmonary artery systolic pressure.  3. Left atrial size was severely dilated.  4. The mitral valve is abnormal. No evidence of mitral valve regurgitation. Moderate mitral stenosis. The mean  mitral valve gradient is 7.0 mmHg. Severe mitral annular calcification.  5. Tricuspid valve regurgitation is mild to moderate.  6. The aortic valve is calcified. Aortic valve regurgitation is trivial. Severe aortic valve stenosis. Aortic valve area, by VTI measures 0.82 cm. Aortic valve mean gradient measures 30.2 mmHg. FINDINGS  Left Ventricle: Left ventricular ejection fraction, by estimation, is 55 to 60%. The left ventricle has normal function. The left ventricle has no regional wall motion abnormalities. The left ventricular internal cavity size was normal in size. There is  mild left ventricular hypertrophy. Left ventricular diastolic parameters are indeterminate. Right Ventricle: The right ventricular size is normal. No increase in right ventricular wall thickness. Right ventricular systolic function is normal. There is mildly elevated pulmonary artery systolic pressure. The tricuspid regurgitant velocity is 2.81  m/s, and with an assumed right atrial pressure of 5 mmHg, the estimated right ventricular systolic pressure is 36.6 mmHg. Left Atrium: Left atrial size was severely dilated. Right Atrium: Right atrial size was normal in size. Pericardium: There is no evidence of pericardial effusion. Mitral Valve: The mitral valve is abnormal. Severe mitral annular calcification. No evidence of mitral valve regurgitation. Moderate mitral valve stenosis. MV peak gradient, 21.0 mmHg. The mean mitral valve gradient is 7.0 mmHg. Tricuspid Valve: The tricuspid valve is normal in structure. Tricuspid valve regurgitation is mild to moderate. No evidence of tricuspid stenosis. Aortic Valve: The aortic valve is calcified. Aortic valve regurgitation is trivial. Severe aortic stenosis is present. Aortic valve mean gradient measures 30.2 mmHg. Aortic valve peak gradient measures 52.1 mmHg. Aortic valve area, by VTI measures 0.82 cm. Pulmonic Valve: The pulmonic valve was normal in structure. Pulmonic valve regurgitation is  not visualized. No evidence of pulmonic stenosis. Aorta: The aortic root is normal in size and structure. Venous: The inferior vena cava was not well visualized. IAS/Shunts: No atrial level shunt detected by color flow Doppler.  LEFT VENTRICLE PLAX 2D LVIDd:         4.70 cm   Diastology LVIDs:         3.10 cm   LV e' medial:    8.92 cm/s LV PW:         1.50 cm   LV E/e' medial:  21.0 LV IVS:        0.90 cm   LV e' lateral:   7.62 cm/s LVOT diam:     2.00 cm   LV E/e' lateral: 24.5 LV SV:         65 LV SV Index:   31 LVOT Area:     3.14 cm  RIGHT VENTRICLE RV Basal diam:  2.90 cm RV Mid diam:    3.40 cm RV S prime:     10.20 cm/s TAPSE (M-mode): 1.5 cm LEFT ATRIUM            Index        RIGHT ATRIUM           Index LA diam:      5.10 cm  2.45 cm/m   RA Area:     15.30 cm LA Vol (A2C): 65.3 ml  31.40 ml/m  RA Volume:   33.20 ml  15.96 ml/m LA Vol (A4C): 116.0 ml 55.77 ml/m  AORTIC VALVE AV Area (Vmax):    0.75 cm AV Area (Vmean):   0.77 cm AV Area (VTI):     0.82 cm AV Vmax:           360.75 cm/s AV Vmean:          254.250 cm/s AV VTI:  0.788 m AV Peak Grad:      52.1 mmHg AV Mean Grad:      30.2 mmHg LVOT Vmax:         86.20 cm/s LVOT Vmean:        62.700 cm/s LVOT VTI:          0.207 m LVOT/AV VTI ratio: 0.26  AORTA Ao Root diam: 2.70 cm MITRAL VALVE                TRICUSPID VALVE MV Area (PHT): 3.39 cm     TR Peak grad:   31.6 mmHg MV Area VTI:   1.15 cm     TR Vmax:        281.00 cm/s MV Peak grad:  21.0 mmHg MV Mean grad:  7.0 mmHg     SHUNTS MV Vmax:       2.29 m/s     Systemic VTI:  0.21 m MV Vmean:      118.0 cm/s   Systemic Diam: 2.00 cm MV Decel Time: 224 msec MV E velocity: 187.00 cm/s MV A velocity: 98.50 cm/s MV E/A ratio:  1.90 Lorine Bears MD Electronically signed by Lorine Bears MD Signature Date/Time: 07/20/2022/5:37:11 PM    Final    DG Chest 2 View  Result Date: 07/18/2022 CLINICAL DATA:  Bilateral leg swelling. EXAM: CHEST - 2 VIEW COMPARISON:  February 18, 2022  FINDINGS: There is stable mild to moderate severity enlargement of the cardiac silhouette. Marked severity calcification of the aortic arch is seen. Low lung volumes are noted with stable elevation of the right hemidiaphragm. Mild, diffuse, chronic appearing increased interstitial lung markings are present. There is no evidence of focal consolidation, pleural effusion or pneumothorax. Multilevel degenerative changes are seen throughout the thoracic spine. IMPRESSION: Chronic appearing increased interstitial lung markings without evidence of acute or active cardiopulmonary disease. Electronically Signed   By: Aram Candela M.D.   On: 07/18/2022 23:46   (Echo, Carotid, EGD, Colonoscopy, ERCP)    Subjective: Pt c/o fatigue    Discharge Exam: Vitals:   07/22/22 0840 07/22/22 1148  BP: (!) 93/49 (!) 109/41  Pulse: 66 66  Resp: 18 18  Temp: 98 F (36.7 C) 98.1 F (36.7 C)  SpO2: 93% 98%   Vitals:   07/21/22 2313 07/22/22 0334 07/22/22 0840 07/22/22 1148  BP: (!) 91/40 (!) 93/35 (!) 93/49 (!) 109/41  Pulse: 66 63 66 66  Resp: 20 18 18 18   Temp: 97.9 F (36.6 C) 98 F (36.7 C) 98 F (36.7 C) 98.1 F (36.7 C)  TempSrc: Oral Oral  Oral  SpO2: 92% 91% 93% 98%  Weight:      Height:        General: Pt is alert, awake, not in acute distress Cardiovascular: S1/S2 +, no rubs, no gallops Respiratory: CTA bilaterally, no wheezing, no rhonchi Abdominal: Soft, NT, obese, bowel sounds + Extremities:  no cyanosis    The results of significant diagnostics from this hospitalization (including imaging, microbiology, ancillary and laboratory) are listed below for reference.     Microbiology: No results found for this or any previous visit (from the past 240 hour(s)).   Labs: BNP (last 3 results) Recent Labs    02/18/22 1203 07/15/22 1648 07/18/22 2203  BNP 287.3* 278.1* 209.3*   Basic Metabolic Panel: Recent Labs  Lab 07/15/22 1648 07/18/22 2203 07/19/22 0443 07/20/22 0419  07/21/22 0654 07/22/22 0615  NA 138 137  --  137 138 138  K 3.1* 3.3* 3.4* 3.7 3.8 4.2  CL 99 99  --  95* 98 101  CO2 29 30  --  32 32 28  GLUCOSE 113* 117*  --  108* 100* 97  BUN 28* 37*  --  39* 45* 42*  CREATININE 1.68* 2.15* 2.14* 2.33* 2.29* 2.19*  CALCIUM 9.0 9.1  --  8.9 8.9 9.2  MG  --   --  2.1 2.0  --   --    Liver Function Tests: No results for input(s): "AST", "ALT", "ALKPHOS", "BILITOT", "PROT", "ALBUMIN" in the last 168 hours. No results for input(s): "LIPASE", "AMYLASE" in the last 168 hours. No results for input(s): "AMMONIA" in the last 168 hours. CBC: Recent Labs  Lab 07/18/22 2203 07/19/22 0443 07/21/22 0654 07/22/22 0615  WBC 6.3 5.3 5.3 5.7  NEUTROABS  --   --   --  3.5  HGB 8.0* 7.4* 7.3* 8.0*  HCT 27.2* 25.3* 23.9* 26.5*  MCV 95.4 96.6 93.7 93.3  PLT 300 264 272 294   Cardiac Enzymes: No results for input(s): "CKTOTAL", "CKMB", "CKMBINDEX", "TROPONINI" in the last 168 hours. BNP: Invalid input(s): "POCBNP" CBG: Recent Labs  Lab 07/21/22 1726  GLUCAP 98   D-Dimer No results for input(s): "DDIMER" in the last 72 hours. Hgb A1c No results for input(s): "HGBA1C" in the last 72 hours. Lipid Profile No results for input(s): "CHOL", "HDL", "LDLCALC", "TRIG", "CHOLHDL", "LDLDIRECT" in the last 72 hours. Thyroid function studies No results for input(s): "TSH", "T4TOTAL", "T3FREE", "THYROIDAB" in the last 72 hours.  Invalid input(s): "FREET3" Anemia work up Recent Labs    07/20/22 0419  FERRITIN 100  TIBC 297  IRON 23*   Urinalysis    Component Value Date/Time   COLORURINE YELLOW 07/19/2022 0922   APPEARANCEUR CLOUDY (A) 07/19/2022 0922   LABSPEC 1.015 07/19/2022 0922   PHURINE 7.0 07/19/2022 0922   GLUCOSEU NEGATIVE 07/19/2022 0922   HGBUR NEGATIVE 07/19/2022 0922   BILIRUBINUR NEGATIVE 07/19/2022 0922   KETONESUR NEGATIVE 07/19/2022 0922   PROTEINUR NEGATIVE 07/19/2022 0922   NITRITE NEGATIVE 07/19/2022 0922   LEUKOCYTESUR SMALL  (A) 07/19/2022 0922   Sepsis Labs Recent Labs  Lab 07/18/22 2203 07/19/22 0443 07/21/22 0654 07/22/22 0615  WBC 6.3 5.3 5.3 5.7   Microbiology No results found for this or any previous visit (from the past 240 hour(s)).   Time coordinating discharge: Over 30 minutes  SIGNED:   Charise Killian, MD  [facility] 07/22/2022, 2:29 PM Pager   If 7PM-7AM, please contact night-coverage [email or web address]

## 2022-07-23 ENCOUNTER — Inpatient Hospital Stay: Payer: Medicare Other

## 2022-07-24 NOTE — Consult Note (Addendum)
[facility] Customer service manager [facility]) Accountable Care Organization (ACO) [facility] Liaison Note  07/24/2022  Rebecca Gilmore 1940/04/15 [id number]  Location: [facility] Hospital Liaison screened the patient remotely at [facility].  Insurance: [employer]   Rebecca Gilmore is a 83 y.o. female who is a Primary Care Patient of Hamrick, Maura L, MD. The patient was screened for readmission hospitalization with noted medium risk score for unplanned readmission risk with 3 IP in 6 months.  The patient was assessed for potential [facility]) Care Management service needs for post hospital transition for care coordination. Review of patient's electronic medical record reveals patient was admitted with CHF. Pt is being followed heavily with the oncology team for ongoing treatment.   Plan: [facility] Liaison will continue to follow progress and disposition to asess for post hospital community care coordination/management needs.  Referral request for community care coordination: anticipate [facility] Transitions of Care Team follow up.   [facility] Care Management/Population Health does not replace or interfere with any arrangements made by the Inpatient Transition of Care team.   For questions contact:   Elliot Cousin, RN, BSN [facility] Liaison [place]   [facility]  Population Health Office Hours MTWF  8:00 am-6:00 pm Off on Thursday [phone] mobile [phone] [Office toll free line][facility] Office Hours are M-F 8:30 - 5 pm 24 hour nurse advise line [phone] Concierge  [email or web address]

## 2022-07-25 DIAGNOSIS — F32A Depression, unspecified: Secondary | ICD-10-CM | POA: Diagnosis not present

## 2022-07-25 DIAGNOSIS — I9589 Other hypotension: Secondary | ICD-10-CM | POA: Diagnosis not present

## 2022-07-25 DIAGNOSIS — I2510 Atherosclerotic heart disease of native coronary artery without angina pectoris: Secondary | ICD-10-CM | POA: Diagnosis not present

## 2022-07-25 DIAGNOSIS — N1832 Chronic kidney disease, stage 3b: Secondary | ICD-10-CM | POA: Diagnosis not present

## 2022-07-25 DIAGNOSIS — Z86718 Personal history of other venous thrombosis and embolism: Secondary | ICD-10-CM | POA: Diagnosis not present

## 2022-07-25 DIAGNOSIS — Z7902 Long term (current) use of antithrombotics/antiplatelets: Secondary | ICD-10-CM | POA: Diagnosis not present

## 2022-07-25 DIAGNOSIS — D509 Iron deficiency anemia, unspecified: Secondary | ICD-10-CM | POA: Diagnosis not present

## 2022-07-25 DIAGNOSIS — I5033 Acute on chronic diastolic (congestive) heart failure: Secondary | ICD-10-CM | POA: Diagnosis not present

## 2022-07-25 DIAGNOSIS — Z9181 History of falling: Secondary | ICD-10-CM | POA: Diagnosis not present

## 2022-07-25 DIAGNOSIS — E876 Hypokalemia: Secondary | ICD-10-CM | POA: Diagnosis not present

## 2022-07-25 DIAGNOSIS — I480 Paroxysmal atrial fibrillation: Secondary | ICD-10-CM | POA: Diagnosis not present

## 2022-07-25 DIAGNOSIS — I083 Combined rheumatic disorders of mitral, aortic and tricuspid valves: Secondary | ICD-10-CM | POA: Diagnosis not present

## 2022-07-25 DIAGNOSIS — N179 Acute kidney failure, unspecified: Secondary | ICD-10-CM | POA: Diagnosis not present

## 2022-07-25 DIAGNOSIS — D631 Anemia in chronic kidney disease: Secondary | ICD-10-CM | POA: Diagnosis not present

## 2022-07-25 DIAGNOSIS — I130 Hypertensive heart and chronic kidney disease with heart failure and stage 1 through stage 4 chronic kidney disease, or unspecified chronic kidney disease: Secondary | ICD-10-CM | POA: Diagnosis not present

## 2022-07-27 ENCOUNTER — Other Ambulatory Visit: Payer: Medicare Other

## 2022-07-28 ENCOUNTER — Ambulatory Visit: Payer: Medicare Other

## 2022-07-28 ENCOUNTER — Ambulatory Visit: Payer: Medicare Other | Admitting: Oncology

## 2022-07-28 DIAGNOSIS — D500 Iron deficiency anemia secondary to blood loss (chronic): Secondary | ICD-10-CM | POA: Diagnosis not present

## 2022-07-28 DIAGNOSIS — R600 Localized edema: Secondary | ICD-10-CM | POA: Diagnosis not present

## 2022-07-28 DIAGNOSIS — I5032 Chronic diastolic (congestive) heart failure: Secondary | ICD-10-CM | POA: Diagnosis not present

## 2022-07-28 DIAGNOSIS — N1832 Chronic kidney disease, stage 3b: Secondary | ICD-10-CM | POA: Diagnosis not present

## 2022-07-28 DIAGNOSIS — I480 Paroxysmal atrial fibrillation: Secondary | ICD-10-CM | POA: Diagnosis not present

## 2022-07-29 ENCOUNTER — Other Ambulatory Visit: Payer: Medicare Other

## 2022-07-29 DIAGNOSIS — N184 Chronic kidney disease, stage 4 (severe): Secondary | ICD-10-CM | POA: Diagnosis not present

## 2022-07-29 DIAGNOSIS — D649 Anemia, unspecified: Secondary | ICD-10-CM | POA: Diagnosis not present

## 2022-07-29 DIAGNOSIS — Z79899 Other long term (current) drug therapy: Secondary | ICD-10-CM | POA: Diagnosis not present

## 2022-07-29 DIAGNOSIS — I350 Nonrheumatic aortic (valve) stenosis: Secondary | ICD-10-CM | POA: Diagnosis not present

## 2022-07-29 DIAGNOSIS — I739 Peripheral vascular disease, unspecified: Secondary | ICD-10-CM | POA: Diagnosis not present

## 2022-07-29 DIAGNOSIS — I5030 Unspecified diastolic (congestive) heart failure: Secondary | ICD-10-CM | POA: Diagnosis not present

## 2022-07-29 DIAGNOSIS — I482 Chronic atrial fibrillation, unspecified: Secondary | ICD-10-CM | POA: Diagnosis not present

## 2022-07-29 DIAGNOSIS — M316 Other giant cell arteritis: Secondary | ICD-10-CM | POA: Diagnosis not present

## 2022-07-29 DIAGNOSIS — Z139 Encounter for screening, unspecified: Secondary | ICD-10-CM | POA: Diagnosis not present

## 2022-07-29 DIAGNOSIS — E782 Mixed hyperlipidemia: Secondary | ICD-10-CM | POA: Diagnosis not present

## 2022-07-30 DIAGNOSIS — I9589 Other hypotension: Secondary | ICD-10-CM | POA: Diagnosis not present

## 2022-07-30 DIAGNOSIS — N1832 Chronic kidney disease, stage 3b: Secondary | ICD-10-CM | POA: Diagnosis not present

## 2022-07-30 DIAGNOSIS — Z86718 Personal history of other venous thrombosis and embolism: Secondary | ICD-10-CM | POA: Diagnosis not present

## 2022-07-30 DIAGNOSIS — D631 Anemia in chronic kidney disease: Secondary | ICD-10-CM | POA: Diagnosis not present

## 2022-07-30 DIAGNOSIS — I130 Hypertensive heart and chronic kidney disease with heart failure and stage 1 through stage 4 chronic kidney disease, or unspecified chronic kidney disease: Secondary | ICD-10-CM | POA: Diagnosis not present

## 2022-07-30 DIAGNOSIS — I480 Paroxysmal atrial fibrillation: Secondary | ICD-10-CM | POA: Diagnosis not present

## 2022-07-30 DIAGNOSIS — I5033 Acute on chronic diastolic (congestive) heart failure: Secondary | ICD-10-CM | POA: Diagnosis not present

## 2022-07-30 DIAGNOSIS — I083 Combined rheumatic disorders of mitral, aortic and tricuspid valves: Secondary | ICD-10-CM | POA: Diagnosis not present

## 2022-07-30 DIAGNOSIS — I2510 Atherosclerotic heart disease of native coronary artery without angina pectoris: Secondary | ICD-10-CM | POA: Diagnosis not present

## 2022-07-30 DIAGNOSIS — D509 Iron deficiency anemia, unspecified: Secondary | ICD-10-CM | POA: Diagnosis not present

## 2022-07-30 DIAGNOSIS — N179 Acute kidney failure, unspecified: Secondary | ICD-10-CM | POA: Diagnosis not present

## 2022-07-30 DIAGNOSIS — E876 Hypokalemia: Secondary | ICD-10-CM | POA: Diagnosis not present

## 2022-07-30 DIAGNOSIS — F32A Depression, unspecified: Secondary | ICD-10-CM | POA: Diagnosis not present

## 2022-07-30 DIAGNOSIS — Z7902 Long term (current) use of antithrombotics/antiplatelets: Secondary | ICD-10-CM | POA: Diagnosis not present

## 2022-07-30 DIAGNOSIS — Z9181 History of falling: Secondary | ICD-10-CM | POA: Diagnosis not present

## 2022-08-04 ENCOUNTER — Ambulatory Visit: Payer: Medicare Other | Admitting: Oncology

## 2022-08-04 ENCOUNTER — Ambulatory Visit: Payer: Medicare Other

## 2022-08-05 ENCOUNTER — Encounter: Payer: Self-pay | Admitting: *Deleted

## 2022-08-05 ENCOUNTER — Encounter
Admission: EM | Disposition: A | Discharge status: Other Acute Inpatient Hospital | Payer: Self-pay | Source: Home / Self Care | Attending: Cardiology

## 2022-08-05 ENCOUNTER — Inpatient Hospital Stay
Admission: EM | Admit: 2022-08-05 | Discharge: 2022-08-05 | DRG: 281 | Disposition: A | Discharge status: Other Acute Inpatient Hospital | Payer: Medicare Other | Attending: Internal Medicine | Admitting: Internal Medicine

## 2022-08-05 ENCOUNTER — Other Ambulatory Visit: Payer: Self-pay

## 2022-08-05 DIAGNOSIS — R54 Age-related physical debility: Secondary | ICD-10-CM | POA: Diagnosis not present

## 2022-08-05 DIAGNOSIS — I214 Non-ST elevation (NSTEMI) myocardial infarction: Secondary | ICD-10-CM | POA: Diagnosis not present

## 2022-08-05 DIAGNOSIS — I2510 Atherosclerotic heart disease of native coronary artery without angina pectoris: Secondary | ICD-10-CM | POA: Diagnosis present

## 2022-08-05 DIAGNOSIS — I130 Hypertensive heart and chronic kidney disease with heart failure and stage 1 through stage 4 chronic kidney disease, or unspecified chronic kidney disease: Secondary | ICD-10-CM | POA: Diagnosis not present

## 2022-08-05 DIAGNOSIS — Z888 Allergy status to other drugs, medicaments and biological substances status: Secondary | ICD-10-CM | POA: Diagnosis not present

## 2022-08-05 DIAGNOSIS — M6281 Muscle weakness (generalized): Secondary | ICD-10-CM | POA: Diagnosis not present

## 2022-08-05 DIAGNOSIS — I499 Cardiac arrhythmia, unspecified: Secondary | ICD-10-CM | POA: Diagnosis not present

## 2022-08-05 DIAGNOSIS — J810 Acute pulmonary edema: Secondary | ICD-10-CM | POA: Diagnosis not present

## 2022-08-05 DIAGNOSIS — D631 Anemia in chronic kidney disease: Secondary | ICD-10-CM | POA: Diagnosis not present

## 2022-08-05 DIAGNOSIS — R279 Unspecified lack of coordination: Secondary | ICD-10-CM | POA: Diagnosis not present

## 2022-08-05 DIAGNOSIS — F32A Depression, unspecified: Secondary | ICD-10-CM | POA: Diagnosis present

## 2022-08-05 DIAGNOSIS — Z66 Do not resuscitate: Secondary | ICD-10-CM | POA: Diagnosis not present

## 2022-08-05 DIAGNOSIS — R918 Other nonspecific abnormal finding of lung field: Secondary | ICD-10-CM | POA: Diagnosis not present

## 2022-08-05 DIAGNOSIS — Z006 Encounter for examination for normal comparison and control in clinical research program: Secondary | ICD-10-CM | POA: Diagnosis not present

## 2022-08-05 DIAGNOSIS — Z7902 Long term (current) use of antithrombotics/antiplatelets: Secondary | ICD-10-CM | POA: Diagnosis not present

## 2022-08-05 DIAGNOSIS — Z952 Presence of prosthetic heart valve: Secondary | ICD-10-CM | POA: Diagnosis not present

## 2022-08-05 DIAGNOSIS — I700 Atherosclerosis of aorta: Secondary | ICD-10-CM | POA: Diagnosis not present

## 2022-08-05 DIAGNOSIS — N1832 Chronic kidney disease, stage 3b: Secondary | ICD-10-CM | POA: Diagnosis present

## 2022-08-05 DIAGNOSIS — I517 Cardiomegaly: Secondary | ICD-10-CM | POA: Diagnosis not present

## 2022-08-05 DIAGNOSIS — I453 Trifascicular block: Secondary | ICD-10-CM | POA: Diagnosis not present

## 2022-08-05 DIAGNOSIS — I213 ST elevation (STEMI) myocardial infarction of unspecified site: Principal | ICD-10-CM

## 2022-08-05 DIAGNOSIS — I5043 Acute on chronic combined systolic (congestive) and diastolic (congestive) heart failure: Secondary | ICD-10-CM | POA: Diagnosis not present

## 2022-08-05 DIAGNOSIS — I4510 Unspecified right bundle-branch block: Secondary | ICD-10-CM | POA: Diagnosis not present

## 2022-08-05 DIAGNOSIS — Z885 Allergy status to narcotic agent status: Secondary | ICD-10-CM | POA: Diagnosis not present

## 2022-08-05 DIAGNOSIS — J90 Pleural effusion, not elsewhere classified: Secondary | ICD-10-CM | POA: Diagnosis not present

## 2022-08-05 DIAGNOSIS — Z7984 Long term (current) use of oral hypoglycemic drugs: Secondary | ICD-10-CM

## 2022-08-05 DIAGNOSIS — J9811 Atelectasis: Secondary | ICD-10-CM | POA: Diagnosis not present

## 2022-08-05 DIAGNOSIS — R5383 Other fatigue: Secondary | ICD-10-CM | POA: Diagnosis not present

## 2022-08-05 DIAGNOSIS — M810 Age-related osteoporosis without current pathological fracture: Secondary | ICD-10-CM | POA: Diagnosis present

## 2022-08-05 DIAGNOSIS — Z79899 Other long term (current) drug therapy: Secondary | ICD-10-CM | POA: Diagnosis not present

## 2022-08-05 DIAGNOSIS — Z880 Allergy status to penicillin: Secondary | ICD-10-CM

## 2022-08-05 DIAGNOSIS — I5023 Acute on chronic systolic (congestive) heart failure: Secondary | ICD-10-CM | POA: Diagnosis not present

## 2022-08-05 DIAGNOSIS — D649 Anemia, unspecified: Secondary | ICD-10-CM | POA: Diagnosis not present

## 2022-08-05 DIAGNOSIS — Z01810 Encounter for preprocedural cardiovascular examination: Secondary | ICD-10-CM | POA: Diagnosis not present

## 2022-08-05 DIAGNOSIS — I420 Dilated cardiomyopathy: Secondary | ICD-10-CM | POA: Diagnosis not present

## 2022-08-05 DIAGNOSIS — I444 Left anterior fascicular block: Secondary | ICD-10-CM | POA: Diagnosis not present

## 2022-08-05 DIAGNOSIS — J9602 Acute respiratory failure with hypercapnia: Secondary | ICD-10-CM | POA: Diagnosis not present

## 2022-08-05 DIAGNOSIS — R0689 Other abnormalities of breathing: Secondary | ICD-10-CM | POA: Diagnosis not present

## 2022-08-05 DIAGNOSIS — Z8249 Family history of ischemic heart disease and other diseases of the circulatory system: Secondary | ICD-10-CM | POA: Diagnosis not present

## 2022-08-05 DIAGNOSIS — R5381 Other malaise: Secondary | ICD-10-CM | POA: Diagnosis not present

## 2022-08-05 DIAGNOSIS — I739 Peripheral vascular disease, unspecified: Secondary | ICD-10-CM | POA: Diagnosis present

## 2022-08-05 DIAGNOSIS — I743 Embolism and thrombosis of arteries of the lower extremities: Secondary | ICD-10-CM | POA: Diagnosis not present

## 2022-08-05 DIAGNOSIS — I5021 Acute systolic (congestive) heart failure: Secondary | ICD-10-CM | POA: Diagnosis not present

## 2022-08-05 DIAGNOSIS — I5033 Acute on chronic diastolic (congestive) heart failure: Secondary | ICD-10-CM | POA: Diagnosis not present

## 2022-08-05 DIAGNOSIS — E785 Hyperlipidemia, unspecified: Secondary | ICD-10-CM | POA: Diagnosis not present

## 2022-08-05 DIAGNOSIS — R6889 Other general symptoms and signs: Secondary | ICD-10-CM | POA: Diagnosis not present

## 2022-08-05 DIAGNOSIS — I2102 ST elevation (STEMI) myocardial infarction involving left anterior descending coronary artery: Secondary | ICD-10-CM | POA: Diagnosis not present

## 2022-08-05 DIAGNOSIS — I5032 Chronic diastolic (congestive) heart failure: Secondary | ICD-10-CM | POA: Diagnosis not present

## 2022-08-05 DIAGNOSIS — I342 Nonrheumatic mitral (valve) stenosis: Secondary | ICD-10-CM | POA: Diagnosis not present

## 2022-08-05 DIAGNOSIS — I724 Aneurysm of artery of lower extremity: Secondary | ICD-10-CM | POA: Diagnosis not present

## 2022-08-05 DIAGNOSIS — Z950 Presence of cardiac pacemaker: Secondary | ICD-10-CM | POA: Diagnosis not present

## 2022-08-05 DIAGNOSIS — Z87891 Personal history of nicotine dependence: Secondary | ICD-10-CM

## 2022-08-05 DIAGNOSIS — E440 Moderate protein-calorie malnutrition: Secondary | ICD-10-CM | POA: Diagnosis not present

## 2022-08-05 DIAGNOSIS — E8729 Other acidosis: Secondary | ICD-10-CM | POA: Diagnosis not present

## 2022-08-05 DIAGNOSIS — N179 Acute kidney failure, unspecified: Secondary | ICD-10-CM | POA: Diagnosis not present

## 2022-08-05 DIAGNOSIS — R079 Chest pain, unspecified: Secondary | ICD-10-CM | POA: Diagnosis not present

## 2022-08-05 DIAGNOSIS — J811 Chronic pulmonary edema: Secondary | ICD-10-CM | POA: Diagnosis not present

## 2022-08-05 DIAGNOSIS — Z91040 Latex allergy status: Secondary | ICD-10-CM

## 2022-08-05 DIAGNOSIS — J439 Emphysema, unspecified: Secondary | ICD-10-CM | POA: Diagnosis not present

## 2022-08-05 DIAGNOSIS — E7800 Pure hypercholesterolemia, unspecified: Secondary | ICD-10-CM | POA: Diagnosis not present

## 2022-08-05 DIAGNOSIS — Z8679 Personal history of other diseases of the circulatory system: Secondary | ICD-10-CM | POA: Diagnosis not present

## 2022-08-05 DIAGNOSIS — I482 Chronic atrial fibrillation, unspecified: Secondary | ICD-10-CM | POA: Diagnosis not present

## 2022-08-05 DIAGNOSIS — I129 Hypertensive chronic kidney disease with stage 1 through stage 4 chronic kidney disease, or unspecified chronic kidney disease: Secondary | ICD-10-CM | POA: Diagnosis not present

## 2022-08-05 DIAGNOSIS — I25118 Atherosclerotic heart disease of native coronary artery with other forms of angina pectoris: Secondary | ICD-10-CM | POA: Diagnosis not present

## 2022-08-05 DIAGNOSIS — K219 Gastro-esophageal reflux disease without esophagitis: Secondary | ICD-10-CM | POA: Diagnosis present

## 2022-08-05 DIAGNOSIS — N390 Urinary tract infection, site not specified: Secondary | ICD-10-CM | POA: Diagnosis not present

## 2022-08-05 DIAGNOSIS — I480 Paroxysmal atrial fibrillation: Secondary | ICD-10-CM | POA: Diagnosis not present

## 2022-08-05 DIAGNOSIS — I350 Nonrheumatic aortic (valve) stenosis: Secondary | ICD-10-CM

## 2022-08-05 DIAGNOSIS — E538 Deficiency of other specified B group vitamins: Secondary | ICD-10-CM | POA: Diagnosis not present

## 2022-08-05 DIAGNOSIS — I2109 ST elevation (STEMI) myocardial infarction involving other coronary artery of anterior wall: Secondary | ICD-10-CM | POA: Diagnosis not present

## 2022-08-05 DIAGNOSIS — Z452 Encounter for adjustment and management of vascular access device: Secondary | ICD-10-CM | POA: Diagnosis not present

## 2022-08-05 DIAGNOSIS — Z7982 Long term (current) use of aspirin: Secondary | ICD-10-CM

## 2022-08-05 DIAGNOSIS — R0602 Shortness of breath: Secondary | ICD-10-CM | POA: Diagnosis not present

## 2022-08-05 DIAGNOSIS — I341 Nonrheumatic mitral (valve) prolapse: Secondary | ICD-10-CM | POA: Diagnosis not present

## 2022-08-05 DIAGNOSIS — G2581 Restless legs syndrome: Secondary | ICD-10-CM | POA: Diagnosis not present

## 2022-08-05 DIAGNOSIS — Z95811 Presence of heart assist device: Secondary | ICD-10-CM | POA: Diagnosis not present

## 2022-08-05 DIAGNOSIS — Z86718 Personal history of other venous thrombosis and embolism: Secondary | ICD-10-CM | POA: Diagnosis not present

## 2022-08-05 DIAGNOSIS — N184 Chronic kidney disease, stage 4 (severe): Secondary | ICD-10-CM | POA: Diagnosis present

## 2022-08-05 DIAGNOSIS — I080 Rheumatic disorders of both mitral and aortic valves: Secondary | ICD-10-CM | POA: Diagnosis present

## 2022-08-05 DIAGNOSIS — I4589 Other specified conduction disorders: Secondary | ICD-10-CM | POA: Diagnosis not present

## 2022-08-05 DIAGNOSIS — I5022 Chronic systolic (congestive) heart failure: Secondary | ICD-10-CM | POA: Diagnosis not present

## 2022-08-05 DIAGNOSIS — I4891 Unspecified atrial fibrillation: Secondary | ICD-10-CM | POA: Diagnosis not present

## 2022-08-05 DIAGNOSIS — I255 Ischemic cardiomyopathy: Secondary | ICD-10-CM | POA: Diagnosis not present

## 2022-08-05 DIAGNOSIS — R001 Bradycardia, unspecified: Secondary | ICD-10-CM | POA: Diagnosis not present

## 2022-08-05 DIAGNOSIS — Z955 Presence of coronary angioplasty implant and graft: Secondary | ICD-10-CM | POA: Diagnosis not present

## 2022-08-05 DIAGNOSIS — N183 Chronic kidney disease, stage 3 unspecified: Secondary | ICD-10-CM | POA: Diagnosis not present

## 2022-08-05 DIAGNOSIS — E559 Vitamin D deficiency, unspecified: Secondary | ICD-10-CM | POA: Diagnosis not present

## 2022-08-05 DIAGNOSIS — Z743 Need for continuous supervision: Secondary | ICD-10-CM | POA: Diagnosis not present

## 2022-08-05 DIAGNOSIS — I2582 Chronic total occlusion of coronary artery: Secondary | ICD-10-CM | POA: Diagnosis present

## 2022-08-05 DIAGNOSIS — L7632 Postprocedural hematoma of skin and subcutaneous tissue following other procedure: Secondary | ICD-10-CM | POA: Diagnosis not present

## 2022-08-05 DIAGNOSIS — Z1152 Encounter for screening for COVID-19: Secondary | ICD-10-CM | POA: Diagnosis not present

## 2022-08-05 DIAGNOSIS — I442 Atrioventricular block, complete: Secondary | ICD-10-CM | POA: Diagnosis not present

## 2022-08-05 DIAGNOSIS — R579 Shock, unspecified: Secondary | ICD-10-CM | POA: Diagnosis not present

## 2022-08-05 DIAGNOSIS — Z4682 Encounter for fitting and adjustment of non-vascular catheter: Secondary | ICD-10-CM | POA: Diagnosis not present

## 2022-08-05 DIAGNOSIS — I440 Atrioventricular block, first degree: Secondary | ICD-10-CM | POA: Diagnosis not present

## 2022-08-05 HISTORY — PX: LEFT HEART CATH AND CORONARY ANGIOGRAPHY: CATH118249

## 2022-08-05 HISTORY — PX: CORONARY/GRAFT ACUTE MI REVASCULARIZATION: CATH118305

## 2022-08-05 LAB — TROPONIN I (HIGH SENSITIVITY)
Troponin I (High Sensitivity): 5080 ng/L (ref <18)
Troponin I (High Sensitivity): 8315 ng/L (ref <18)

## 2022-08-05 LAB — BASIC METABOLIC PANEL
Anion gap: 8 (ref 5–15)
BUN: 30 mg/dL — ABNORMAL HIGH (ref 8–23)
CO2: 29 mmol/L (ref 22–32)
Calcium: 9.4 mg/dL (ref 8.9–10.3)
Chloride: 104 mmol/L (ref 98–111)
Creatinine, Ser: 1.62 mg/dL — ABNORMAL HIGH (ref 0.44–1.00)
GFR, Estimated: 32 mL/min — ABNORMAL LOW (ref >60)
Glucose, Bld: 107 mg/dL — ABNORMAL HIGH (ref 70–99)
Potassium: 3.8 mmol/L (ref 3.5–5.1)
Sodium: 141 mmol/L (ref 135–145)

## 2022-08-05 LAB — SAMPLE TO BLOOD BANK

## 2022-08-05 LAB — CBC
HCT: 30.5 % — ABNORMAL LOW (ref 36.0–46.0)
Hemoglobin: 9 g/dL — ABNORMAL LOW (ref 12.0–15.0)
MCH: 27.2 pg (ref 26.0–34.0)
MCHC: 29.5 g/dL — ABNORMAL LOW (ref 30.0–36.0)
MCV: 92.1 fL (ref 80.0–100.0)
Platelets: 236 10*3/uL (ref 150–400)
RBC: 3.31 MIL/uL — ABNORMAL LOW (ref 3.87–5.11)
RDW: 14.6 % (ref 11.5–15.5)
WBC: 5.3 10*3/uL (ref 4.0–10.5)
nRBC: 0 % (ref 0.0–0.2)

## 2022-08-05 LAB — LIPID PANEL
Cholesterol: 126 mg/dL (ref 0–200)
HDL: 50 mg/dL (ref >40)
LDL Cholesterol: 57 mg/dL (ref 0–99)
Total CHOL/HDL Ratio: 2.5 RATIO
Triglycerides: 93 mg/dL (ref <150)
VLDL: 19 mg/dL (ref 0–40)

## 2022-08-05 LAB — APTT: aPTT: 35 seconds (ref 24–36)

## 2022-08-05 LAB — PROTIME-INR
INR: 1.2 (ref 0.8–1.2)
Prothrombin Time: 15.1 seconds (ref 11.4–15.2)

## 2022-08-05 LAB — GLUCOSE, CAPILLARY: Glucose-Capillary: 104 mg/dL — ABNORMAL HIGH (ref 70–99)

## 2022-08-05 LAB — MRSA NEXT GEN BY PCR, NASAL: MRSA by PCR Next Gen: NOT DETECTED

## 2022-08-05 SURGERY — CORONARY/GRAFT ACUTE MI REVASCULARIZATION
Anesthesia: Moderate Sedation

## 2022-08-05 MED ORDER — HEPARIN (PORCINE) IN NACL 1000-0.9 UT/500ML-% IV SOLN
INTRAVENOUS | Status: DC | PRN
  Administered 2022-08-05: 1000 mL

## 2022-08-05 MED ORDER — ASPIRIN 81 MG PO CHEW
81.0000 mg | CHEWABLE_TABLET | Freq: Every day | ORAL | Status: DC
  Administered 2022-08-05: 81 mg via ORAL
  Filled 2022-08-05: qty 1

## 2022-08-05 MED ORDER — CLOPIDOGREL BISULFATE 75 MG PO TABS
75.0000 mg | ORAL_TABLET | Freq: Every day | ORAL | Status: DC
  Administered 2022-08-05: 75 mg via ORAL
  Filled 2022-08-05: qty 1

## 2022-08-05 MED ORDER — ATORVASTATIN CALCIUM 80 MG PO TABS: 80.0000 mg | ORAL_TABLET | Freq: Every day | ORAL | Status: DC

## 2022-08-05 MED ORDER — IOHEXOL 300 MG/ML  SOLN
INTRAMUSCULAR | Status: DC | PRN
  Administered 2022-08-05: 105 mL

## 2022-08-05 MED ORDER — HEPARIN SODIUM (PORCINE) 5000 UNIT/ML IJ SOLN
4000.0000 [IU] | Freq: Once | INTRAMUSCULAR | Status: AC
  Administered 2022-08-05: 4000 [IU] via INTRAVENOUS

## 2022-08-05 MED ORDER — HEPARIN SODIUM (PORCINE) 1000 UNIT/ML IJ SOLN
INTRAMUSCULAR | Status: DC | PRN
  Administered 2022-08-05: 5000 [IU] via INTRAVENOUS

## 2022-08-05 MED ORDER — MIDAZOLAM HCL 2 MG/2ML IJ SOLN
INTRAMUSCULAR | Status: DC | PRN
  Administered 2022-08-05 (×2): .5 mg via INTRAVENOUS

## 2022-08-05 MED ORDER — FENTANYL CITRATE (PF) 100 MCG/2ML IJ SOLN
INTRAMUSCULAR | Status: AC
  Filled 2022-08-05: qty 2

## 2022-08-05 MED ORDER — LIDOCAINE HCL (PF) 1 % IJ SOLN
INTRAMUSCULAR | Status: DC | PRN
  Administered 2022-08-05: 2 mL

## 2022-08-05 MED ORDER — HEPARIN (PORCINE) 25000 UT/250ML-% IV SOLN
1000.0000 [IU]/h | INTRAVENOUS | Status: DC
  Administered 2022-08-05: 1000 [IU]/h via INTRAVENOUS
  Filled 2022-08-05: qty 250

## 2022-08-05 MED ORDER — SODIUM CHLORIDE 0.9% FLUSH: 3.0000 mL | INTRAVENOUS | Status: DC | PRN

## 2022-08-05 MED ORDER — VERAPAMIL HCL 2.5 MG/ML IV SOLN
INTRAVENOUS | Status: DC | PRN
  Administered 2022-08-05: 2.5 mg via INTRA_ARTERIAL

## 2022-08-05 MED ORDER — HEPARIN (PORCINE) 25000 UT/250ML-% IV SOLN: 1000.0000 [IU]/h | INTRAVENOUS | Status: DC

## 2022-08-05 MED ORDER — HEPARIN SODIUM (PORCINE) 1000 UNIT/ML IJ SOLN
INTRAMUSCULAR | Status: AC
  Filled 2022-08-05: qty 10

## 2022-08-05 MED ORDER — MIDODRINE HCL 5 MG PO TABS: 5.0000 mg | ORAL_TABLET | Freq: Three times a day (TID) | ORAL | Status: DC

## 2022-08-05 MED ORDER — FENTANYL CITRATE (PF) 100 MCG/2ML IJ SOLN
INTRAMUSCULAR | Status: DC | PRN
  Administered 2022-08-05 (×2): 12.5 ug via INTRAVENOUS

## 2022-08-05 MED ORDER — ONDANSETRON HCL 4 MG/2ML IJ SOLN: 4.0000 mg | Freq: Four times a day (QID) | INTRAMUSCULAR | Status: DC | PRN

## 2022-08-05 MED ORDER — HEPARIN (PORCINE) IN NACL 1000-0.9 UT/500ML-% IV SOLN
INTRAVENOUS | Status: AC
  Filled 2022-08-05: qty 1000

## 2022-08-05 MED ORDER — MIDODRINE HCL 5 MG PO TABS
10.0000 mg | ORAL_TABLET | Freq: Three times a day (TID) | ORAL | Status: DC
  Administered 2022-08-05: 10 mg via ORAL
  Filled 2022-08-05: qty 2

## 2022-08-05 MED ORDER — ACETAMINOPHEN 325 MG PO TABS: 650.0000 mg | ORAL_TABLET | ORAL | Status: DC | PRN

## 2022-08-05 MED ORDER — SODIUM CHLORIDE 0.9 % IV SOLN: 250.0000 mL | INTRAVENOUS | Status: DC | PRN

## 2022-08-05 MED ORDER — SODIUM CHLORIDE 0.9% FLUSH: 3.0000 mL | Freq: Two times a day (BID) | INTRAVENOUS | Status: DC

## 2022-08-05 MED ORDER — MIDAZOLAM HCL 2 MG/2ML IJ SOLN
INTRAMUSCULAR | Status: AC
  Filled 2022-08-05: qty 2

## 2022-08-05 MED ORDER — VERAPAMIL HCL 2.5 MG/ML IV SOLN
INTRAVENOUS | Status: AC
  Filled 2022-08-05: qty 2

## 2022-08-05 MED ORDER — HYDRALAZINE HCL 20 MG/ML IJ SOLN: 10.0000 mg | INTRAMUSCULAR | Status: AC | PRN

## 2022-08-05 MED ORDER — ATORVASTATIN CALCIUM 20 MG PO TABS
80.0000 mg | ORAL_TABLET | Freq: Every day | ORAL | Status: DC
  Filled 2022-08-05: qty 4

## 2022-08-05 MED ORDER — CHLORHEXIDINE GLUCONATE CLOTH 2 % EX PADS
6.0000 | MEDICATED_PAD | Freq: Every day | CUTANEOUS | Status: DC
  Administered 2022-08-05: 6 via TOPICAL

## 2022-08-05 MED ORDER — SODIUM CHLORIDE 0.9 % WEIGHT BASED INFUSION
1.0000 mL/kg/h | INTRAVENOUS | Status: DC
  Administered 2022-08-05: 1 mL/kg/h via INTRAVENOUS

## 2022-08-05 MED ORDER — ASPIRIN 81 MG PO CHEW: 81.0000 mg | CHEWABLE_TABLET | Freq: Every day | ORAL | Status: DC

## 2022-08-05 SURGICAL SUPPLY — 15 items
CATH INFINITI JR4 5F (CATHETERS) IMPLANT
CATH LAUNCHER 6FR EBU 3 (CATHETERS) IMPLANT
DEVICE RAD TR BAND REGULAR (VASCULAR PRODUCTS) IMPLANT
DRAPE BRACHIAL (DRAPES) IMPLANT
GLIDESHEATH SLEND SS 6F .021 (SHEATH) IMPLANT
GUIDEWIRE INQWIRE 1.5J.035X260 (WIRE) IMPLANT
INQWIRE 1.5J .035X260CM (WIRE) ×1
KIT ENCORE 26 ADVANTAGE (KITS) IMPLANT
PACK CARDIAC CATH (CUSTOM PROCEDURE TRAY) ×2 IMPLANT
PROTECTION STATION PRESSURIZED (MISCELLANEOUS) ×1
SET ATX-X65L (MISCELLANEOUS) IMPLANT
STATION PROTECTION PRESSURIZED (MISCELLANEOUS) IMPLANT
TUBING CIL FLEX 10 FLL-RA (TUBING) IMPLANT
WIRE ASAHI PROWATER 180CM (WIRE) IMPLANT
WIRE HITORQ VERSACORE ST 145CM (WIRE) IMPLANT

## 2022-08-05 NOTE — ED Triage Notes (Addendum)
BIB [facility] from home for CP, STEMI activated, onset 0800, twisting type pain, radiates to L arm, shoulder, neck. Rates pain 4/10 on arrival heavy and pain. NTG taken at home. EMS gave ASA 324mg . NSL 20g L FA. EKG with elevation in V4,V5,V6. Aortic valve repair scheduled next week. Alert, NAD, calm, interactive.

## 2022-08-05 NOTE — Discharge Summary (Signed)
Physician Discharge Summary   Patient: Rebecca Gilmore MRN: [id number] DOB: June 24, 1940  Admit date:     08/05/2022  Discharge date: 08/05/22  Discharge Physician: Marrion Coy   PCP: Ailene Ravel, MD   Recommendations at discharge:   Transfer to [facility] for further treatment.  Discharge Diagnoses: Principal Problem:   Acute ST elevation myocardial infarction (STEMI) involving left anterior descending (LAD) coronary artery (HCC) Active Problems:   Chronic diastolic CHF (congestive heart failure) (HCC)   Severe aortic stenosis   Atrial fibrillation, chronic (HCC)   Chronic kidney disease, stage 3b (HCC)  Resolved Problems:   * No resolved hospital problems. *  Hospital Course: Rebecca Gilmore is a 82 y.o. female with medical history significant of chronic atrial fibrillation, chronic diastolic congestive heart failure, chronic kidney disease stage IIIb, essential hypertension, who present to the hospital with chest pain. EKG showed ST elevation, patient was brought to the Cath Lab, which showed occluded distal LAD, and a 75% stenosis in his ostial RCA.  Patient was placed on aspirin, Plavix and heparin drip. Patient has been accepted to transfer to [facility].  Currently pending transport.  Assessment and Plan: STEMI with two-vessel disease. Status post heart cath, pending transfer to [facility] for further intervention. Continue heparin drip, aspirin Plavix and Lipitor.   Severe aortic stenosis. Moderate mitral valve stenosis. Follow-up with [facility] for TVAR   Chronic atrial fibrillation. Heart rate under control.   Chronic kidney disease stage IV. Renal function is stable.       Consultants: Cardiology Procedures performed: Heart cath  Disposition:  [employer] Diet recommendation:  Cardiac diet DISCHARGE MEDICATION: Allergies as of 08/05/2022       Reactions   Morphine Hives, Itching   Other Reaction(s): Confusion   Other  Hives, Itching, Other (See Comments)   Other Reaction(s): Confusion   Morphine And Codeine Itching   Kenalog [triamcinolone Acetonide] Other (See Comments)   FLUSH IN FACE   Penicillins Other (See Comments)   Has patient had a PCN reaction causing immediate rash, facial/tongue/throat swelling, SOB or lightheadedness with hypotension: Unknown Has patient had a PCN reaction causing severe rash involving mucus membranes or skin necrosis: Unknown Has patient had a PCN reaction that required hospitalization:No Has patient had a PCN reaction occurring within the last 10 years: No If all of the above answers are "NO", then may proceed with Cephalosporin use.   Latex Rash   Povidone-iodine Rash   burning        Medication List     STOP taking these medications    metolazone 2.5 MG tablet Commonly known as: ZAROXOLYN   potassium chloride 10 MEQ CR capsule Commonly known as: MICRO-K   potassium chloride SA 20 MEQ tablet Commonly known as: KLOR-CON M   rosuvastatin 5 MG tablet Commonly known as: CRESTOR   torsemide 20 MG tablet Commonly known as: DEMADEX       TAKE these medications    acetaminophen 325 MG tablet Commonly known as: TYLENOL Take 2 tablets (650 mg total) by mouth every 6 (six) hours as needed for mild pain or fever.   amitriptyline 25 MG tablet Commonly known as: ELAVIL Take 25 mg by mouth at bedtime as needed.   aspirin 81 MG chewable tablet Chew 1 tablet (81 mg total) by mouth daily. Start taking on: Aug 06, 2022   atorvastatin 80 MG tablet Commonly known as: LIPITOR Take 1 tablet (80 mg total) by mouth daily.  cholecalciferol 25 MCG (1000 UNIT) tablet Commonly known as: VITAMIN D3 Take 1,000 Units by mouth daily.   clopidogrel 75 MG tablet Commonly known as: PLAVIX Take 75 mg by mouth every other day.   cyanocobalamin 1000 MCG tablet Commonly known as: VITAMIN B12 Take 1,000 mcg by mouth daily.   dapagliflozin propanediol 10 MG Tabs  tablet Commonly known as: Farxiga Take 1 tablet (10 mg total) by mouth daily.   heparin 16109 UT/250ML infusion Inject 1,000 Units/hr into the vein continuous.   Iron 325 (65 Fe) MG Tabs Take 1 tablet by mouth daily.   loratadine 10 MG tablet Commonly known as: CLARITIN Take 10 mg by mouth daily as needed.   midodrine 10 MG tablet Commonly known as: PROAMATINE Take 1 tablet (10 mg total) by mouth 3 (three) times daily with meals for 14 days.   pantoprazole 40 MG tablet Commonly known as: PROTONIX Take 40 mg by mouth at bedtime.   pregabalin 75 MG capsule Commonly known as: Lyrica Take 1 capsule (75 mg total) by mouth daily.   sertraline 50 MG tablet Commonly known as: ZOLOFT Take 50 mg by mouth daily.        Discharge Exam: Filed Weights   08/05/22 0951  Weight: 99.8 kg   General exam: Appears calm and comfortable  Respiratory system: Clear to auscultation. Respiratory effort normal. Cardiovascular system: Regular. 2/6 systolic murmurs.  No pedal edema. Gastrointestinal system: Abdomen is nondistended, soft and nontender. No organomegaly or masses felt. Normal bowel sounds heard. Central nervous system: Alert and oriented. No focal neurological deficits. Extremities: Symmetric 5 x 5 power. Skin: No rashes, lesions or ulcers Psychiatry: Judgement and insight appear normal. Mood & affect appropriate.    Condition at discharge: fair  The results of significant diagnostics from this hospitalization (including imaging, microbiology, ancillary and laboratory) are listed below for reference.   Imaging Studies: CARDIAC CATHETERIZATION  Result Date: 08/05/2022   Ost RCA to Prox RCA lesion is 75% stenosed.   Ost LM to Mid LM lesion is 40% stenosed.   Prox LAD to Mid LAD lesion is 40% stenosed.   Dist LAD lesion is 100% stenosed.   1st Mrg lesion is 50% stenosed. 1.  Anterolateral STEMI 2.  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  Known critical aortic stenosis Recommendations 1.  Medical therapy in the absence of chest pain 2.  Heparin infusion 48 hours 3.  Patient scheduled to have TAVR at [facility]   ECHOCARDIOGRAM COMPLETE  Result Date: 07/20/2022    ECHOCARDIOGRAM REPORT   Patient Name:   Alvino Chapel [facility] Date of Exam: 07/20/2022 Medical Rec #:  [id number]       Height:       64.0 in Accession #:    [id number]      Weight:       231.0 lb Date of Birth:  [birth year]        BSA:          2.080 m Patient Age:    82 years        BP:           107/49 mmHg Patient Gender: F               HR:           67 bpm. Exam Location:  [facility] Procedure: 2D Echo, Cardiac Doppler and Color Doppler Indications:     CHF--acute diastolic I50.31  History:  Patient has prior history of Echocardiogram examinations, most                  recent 11/17/2021. CHF.  Sonographer:     Cristela Blue Referring Phys:  [id number] Andris Baumann Diagnosing Phys: Lorine Bears MD IMPRESSIONS  1. Left ventricular ejection fraction, by estimation, is 55 to 60%. The left ventricle has normal function. The left ventricle has no regional wall motion abnormalities. There is mild left ventricular hypertrophy. Left ventricular diastolic parameters are indeterminate.  2. Right ventricular systolic function is normal. The right ventricular size is normal. There is mildly elevated pulmonary artery systolic pressure.  3. Left atrial size was severely dilated.  4. The mitral valve is abnormal. No evidence of mitral valve regurgitation. Moderate mitral stenosis. The mean mitral valve gradient is 7.0 mmHg. Severe mitral annular calcification.  5. Tricuspid valve regurgitation is mild to moderate.  6. The aortic valve is calcified. Aortic valve regurgitation is trivial. Severe aortic valve stenosis. Aortic valve area, by VTI measures 0.82 cm. Aortic valve mean gradient measures 30.2 mmHg. FINDINGS  Left Ventricle: Left ventricular ejection fraction, by estimation, is 55 to 60%. The  left ventricle has normal function. The left ventricle has no regional wall motion abnormalities. The left ventricular internal cavity size was normal in size. There is  mild left ventricular hypertrophy. Left ventricular diastolic parameters are indeterminate. Right Ventricle: The right ventricular size is normal. No increase in right ventricular wall thickness. Right ventricular systolic function is normal. There is mildly elevated pulmonary artery systolic pressure. The tricuspid regurgitant velocity is 2.81  m/s, and with an assumed right atrial pressure of 5 mmHg, the estimated right ventricular systolic pressure is 36.6 mmHg. Left Atrium: Left atrial size was severely dilated. Right Atrium: Right atrial size was normal in size. Pericardium: There is no evidence of pericardial effusion. Mitral Valve: The mitral valve is abnormal. Severe mitral annular calcification. No evidence of mitral valve regurgitation. Moderate mitral valve stenosis. MV peak gradient, 21.0 mmHg. The mean mitral valve gradient is 7.0 mmHg. Tricuspid Valve: The tricuspid valve is normal in structure. Tricuspid valve regurgitation is mild to moderate. No evidence of tricuspid stenosis. Aortic Valve: The aortic valve is calcified. Aortic valve regurgitation is trivial. Severe aortic stenosis is present. Aortic valve mean gradient measures 30.2 mmHg. Aortic valve peak gradient measures 52.1 mmHg. Aortic valve area, by VTI measures 0.82 cm. Pulmonic Valve: The pulmonic valve was normal in structure. Pulmonic valve regurgitation is not visualized. No evidence of pulmonic stenosis. Aorta: The aortic root is normal in size and structure. Venous: The inferior vena cava was not well visualized. IAS/Shunts: No atrial level shunt detected by color flow Doppler.  LEFT VENTRICLE PLAX 2D LVIDd:         4.70 cm   Diastology LVIDs:         3.10 cm   LV e' medial:    8.92 cm/s LV PW:         1.50 cm   LV E/e' medial:  21.0 LV IVS:        0.90 cm   LV e'  lateral:   7.62 cm/s LVOT diam:     2.00 cm   LV E/e' lateral: 24.5 LV SV:         65 LV SV Index:   31 LVOT Area:     3.14 cm  RIGHT VENTRICLE RV Basal diam:  2.90 cm RV Mid diam:    3.40 cm RV  S prime:     10.20 cm/s TAPSE (M-mode): 1.5 cm LEFT ATRIUM            Index        RIGHT ATRIUM           Index LA diam:      5.10 cm  2.45 cm/m   RA Area:     15.30 cm LA Vol (A2C): 65.3 ml  31.40 ml/m  RA Volume:   33.20 ml  15.96 ml/m LA Vol (A4C): 116.0 ml 55.77 ml/m  AORTIC VALVE AV Area (Vmax):    0.75 cm AV Area (Vmean):   0.77 cm AV Area (VTI):     0.82 cm AV Vmax:           360.75 cm/s AV Vmean:          254.250 cm/s AV VTI:            0.788 m AV Peak Grad:      52.1 mmHg AV Mean Grad:      30.2 mmHg LVOT Vmax:         86.20 cm/s LVOT Vmean:        62.700 cm/s LVOT VTI:          0.207 m LVOT/AV VTI ratio: 0.26  AORTA Ao Root diam: 2.70 cm MITRAL VALVE                TRICUSPID VALVE MV Area (PHT): 3.39 cm     TR Peak grad:   31.6 mmHg MV Area VTI:   1.15 cm     TR Vmax:        281.00 cm/s MV Peak grad:  21.0 mmHg MV Mean grad:  7.0 mmHg     SHUNTS MV Vmax:       2.29 m/s     Systemic VTI:  0.21 m MV Vmean:      118.0 cm/s   Systemic Diam: 2.00 cm MV Decel Time: 224 msec MV E velocity: 187.00 cm/s MV A velocity: 98.50 cm/s MV E/A ratio:  1.90 Lorine Bears MD Electronically signed by Lorine Bears MD Signature Date/Time: 07/20/2022/5:37:11 PM    Final    DG Chest 2 View  Result Date: 07/18/2022 CLINICAL DATA:  Bilateral leg swelling. EXAM: CHEST - 2 VIEW COMPARISON:  February 18, 2022 FINDINGS: There is stable mild to moderate severity enlargement of the cardiac silhouette. Marked severity calcification of the aortic arch is seen. Low lung volumes are noted with stable elevation of the right hemidiaphragm. Mild, diffuse, chronic appearing increased interstitial lung markings are present. There is no evidence of focal consolidation, pleural effusion or pneumothorax. Multilevel degenerative changes are  seen throughout the thoracic spine. IMPRESSION: Chronic appearing increased interstitial lung markings without evidence of acute or active cardiopulmonary disease. Electronically Signed   By: Aram Candela M.D.   On: 07/18/2022 23:46    Microbiology: Results for orders placed or performed during the hospital encounter of 08/05/22  MRSA Next Gen by PCR, Nasal     Status: None   Collection Time: 08/05/22 11:31 AM   Specimen: Nasal Mucosa; Nasal Swab  Result Value Ref Range Status   MRSA by PCR Next Gen NOT DETECTED NOT DETECTED Final    Comment: (NOTE) The GeneXpert MRSA Assay (FDA approved for NASAL specimens only), is one component of a comprehensive MRSA colonization surveillance program. It is not intended to diagnose MRSA infection nor to guide or monitor treatment for MRSA infections. Test performance  is not FDA approved in patients less than 68 years old. Performed at [facility], [address] [place], [place] [address]     Labs: CBC: Recent Labs  Lab 08/05/22 1001  WBC 5.3  HGB 9.0*  HCT 30.5*  MCV 92.1  PLT 236   Basic Metabolic Panel: Recent Labs  Lab 08/05/22 1001  NA 141  K 3.8  CL 104  CO2 29  GLUCOSE 107*  BUN 30*  CREATININE 1.62*  CALCIUM 9.4   Liver Function Tests: No results for input(s): "AST", "ALT", "ALKPHOS", "BILITOT", "PROT", "ALBUMIN" in the last 168 hours. CBG: Recent Labs  Lab 08/05/22 1119  GLUCAP 104*    Discharge time spent: No charge 30 minutes.  Signed: Marrion Coy, MD [facility] 08/05/2022

## 2022-08-05 NOTE — ED Notes (Signed)
Code  stemi  called  to  [facility] 

## 2022-08-05 NOTE — ED Notes (Signed)
Card at [facility], heparin given

## 2022-08-05 NOTE — Consult Note (Signed)
[facility] Cardiology  CARDIOLOGY CONSULT NOTE  Patient ID: Rebecca Gilmore MRN: [id number] DOB/AGE: 11/29/1940 82 y.o.  Admit date: 08/05/2022 Referring Physician Modesto Charon Primary Physician [facility] Primary Cardiologist Lua Feng Reason for Consultation anterior lateral STEMI  HPI: 82 year old female referred for evaluation of anterolateral STEMI.  The patient was in her usual state of health until this morning when she developed 4 out of 10 chest pain.  After 2 hours, she called EMS and the patient was brought to [facility] ED.  Initial ECG revealed 1-2 mm of ST elevation in leads V3 through V6 consistent with a lateral STEMI.  Patient underwent cardiac catheterization which revealed two-vessel coronary artery disease with occluded distal small caliber LAD and 75% ostial stenosis RCA.  Current cardiac catheterization the patient became chest pain-free.  In light of patient's underlying chronic kidney disease and severe aortic stenosis decided to proceed with medical therapy.  Patient has multiple comorbidities including severe aortic stenosis, chronic HFpEF, CKD stage IIIb, atrial fibrillation and DVT, anemia, recurrent GI bleed with negative extensive GI workup, normal bone marrow biopsy.  The patient is scheduled to undergo evaluation for TAVR at [facility] next week.  Review of systems complete and found to be negative unless listed above     Past Medical History:  Diagnosis Date   Anemia    Arrhythmia    atrial fibrillation   CHF (congestive heart failure) (HCC)    Chronic kidney disease    Depression    HBP (high blood pressure)    History of bladder problems    Memory loss    Muscle pain    Osteoporosis    Reflux    Sinus congestion    Swelling    B/L FEET AND LEGS    Past Surgical History:  Procedure Laterality Date   ARTERY BIOPSY Bilateral 12/01/2016   Procedure: BIOPSY TEMPORAL ARTERY;  Surgeon: Fransisco Hertz, MD;  Location: [facility] OR;  Service: Vascular;  Laterality: Bilateral;   CATARACT  SURGERY     COLONOSCOPY WITH PROPOFOL N/A 12/13/2021   Procedure: COLONOSCOPY WITH PROPOFOL;  Surgeon: Wyline Mood, MD;  Location: [facility] ENDOSCOPY;  Service: Gastroenterology;  Laterality: N/A;   ESOPHAGOGASTRODUODENOSCOPY (EGD) WITH PROPOFOL N/A 12/12/2021   Procedure: ESOPHAGOGASTRODUODENOSCOPY (EGD) WITH PROPOFOL;  Surgeon: Jaynie Collins, DO;  Location: [facility] ENDOSCOPY;  Service: Gastroenterology;  Laterality: N/A;   EYELID SURGERY Bilateral    GIVENS CAPSULE STUDY N/A 12/15/2021   Procedure: GIVENS CAPSULE STUDY;  Surgeon: Jaynie Collins, DO;  Location: [facility] ENDOSCOPY;  Service: Gastroenterology;  Laterality: N/A;   GIVENS CAPSULE STUDY N/A 01/28/2022   Procedure: GIVENS CAPSULE STUDY;  Surgeon: Toney Reil, MD;  Location: [facility] ENDOSCOPY;  Service: Gastroenterology;  Laterality: N/A;   GROWTH ON FACE     KNEE SURGERY Right    THROMBECTOMY FEMORAL ARTERY Left 11/16/2021   Procedure: THROMBECTOMY FEMORAL ARTERY;  Surgeon: Fransisco Hertz, MD;  Location: [facility] ORS;  Service: Vascular;  Laterality: Left;    Medications Prior to Admission  Medication Sig Dispense Refill Last Dose   acetaminophen (TYLENOL) 325 MG tablet Take 2 tablets (650 mg total) by mouth every 6 (six) hours as needed for mild pain or fever.      amitriptyline (ELAVIL) 25 MG tablet Take 25 mg by mouth at bedtime as needed.      cholecalciferol (VITAMIN D3) 25 MCG (1000 UT) tablet Take 1,000 Units by mouth daily.      clopidogrel (PLAVIX) 75 MG tablet Take 75 mg by  mouth every other day.      cyanocobalamin (VITAMIN B12) 1000 MCG tablet Take 1,000 mcg by mouth daily.      dapagliflozin propanediol (FARXIGA) 10 MG TABS tablet Take 1 tablet (10 mg total) by mouth daily. 30 tablet 6    Ferrous Sulfate (IRON) 325 (65 Fe) MG TABS Take 1 tablet by mouth daily.      loratadine (CLARITIN) 10 MG tablet Take 10 mg by mouth daily as needed.      metolazone (ZAROXOLYN) 2.5 MG tablet Take 1 tablet (2.5 mg total) by mouth  daily. Hold this medication until you see your cardiologist 5 tablet 0    midodrine (PROAMATINE) 10 MG tablet Take 1 tablet (10 mg total) by mouth 3 (three) times daily with meals for 14 days. 42 tablet 0    pantoprazole (PROTONIX) 40 MG tablet Take 40 mg by mouth at bedtime.      potassium chloride SA (KLOR-CON M) 20 MEQ tablet Take 3 tablets (60 mEq total) by mouth daily. Take an extra 2 tablets on your metolazone days. Hold this medication until you see your cardiologist 98 tablet 3    pregabalin (LYRICA) 75 MG capsule Take 1 capsule (75 mg total) by mouth daily. 30 capsule 0    rosuvastatin (CRESTOR) 5 MG tablet Take 5 mg by mouth at bedtime.      sertraline (ZOLOFT) 50 MG tablet Take 50 mg by mouth daily.  0    torsemide (DEMADEX) 20 MG tablet Take 4 tablets (80 mg total) by mouth 2 (two) times daily. Hold this medication until you see your cardiologist 240 tablet 3    Social History   Socioeconomic History   Marital status: Married    Spouse name: Not on file   Number of children: Not on file   Years of education: Not on file   Highest education level: Not on file  Occupational History   Not on file  Tobacco Use   Smoking status: Former    Types: Cigarettes    Quit date: 01/12/1993    Years since quitting: 29.5   Smokeless tobacco: Former    Types: Snuff, Chew   Tobacco comments:    DATE QUIT UNKNOWN  Vaping Use   Vaping Use: Never used  Substance and Sexual Activity   Alcohol use: No   Drug use: No   Sexual activity: Not on file  Other Topics Concern   Not on file  Social History Narrative   Not on file   Social Determinants of Health   Financial Resource Strain: Not on file  Food Insecurity: No Food Insecurity (02/18/2022)   Hunger Vital Sign    Worried About Running Out of Food in the Last Year: Never true    Ran Out of Food in the Last Year: Never true  Transportation Needs: No Transportation Needs (02/18/2022)   PRAPARE - Scientist, research (physical sciences) (Medical): No    Lack of Transportation (Non-Medical): No  Physical Activity: Not on file  Stress: Not on file  Social Connections: Not on file  Intimate Partner Violence: Not At Risk (02/18/2022)   Humiliation, Afraid, Rape, and Kick questionnaire    Fear of Current or Ex-Partner: No    Emotionally Abused: No    Physically Abused: No    Sexually Abused: No    Family History  Problem Relation Age of Onset   Heart disease Mother    AAA (abdominal aortic aneurysm) Mother  Heart disease Father       Review of systems complete and found to be negative unless listed above      PHYSICAL EXAM  General: Well developed, well nourished, in no acute distress HEENT:  Normocephalic and atramatic Neck:  No JVD.  Lungs: Clear bilaterally to auscultation and percussion. Heart: HRRR . Normal S1 and S2 without gallops or murmurs.  Abdomen: Bowel sounds are positive, abdomen soft and non-tender  Msk:  Back normal, normal gait. Normal strength and tone for age. Extremities: No clubbing, cyanosis or edema.   Neuro: Alert and oriented X 3. Psych:  Good affect, responds appropriately  Labs:   Lab Results  Component Value Date   WBC 5.3 08/05/2022   HGB 9.0 (L) 08/05/2022   HCT 30.5 (L) 08/05/2022   MCV 92.1 08/05/2022   PLT 236 08/05/2022    Recent Labs  Lab 08/05/22 1001  NA 141  K 3.8  CL 104  CO2 29  BUN 30*  CREATININE 1.62*  CALCIUM 9.4  GLUCOSE 107*   No results found for: "CKTOTAL", "CKMB", "CKMBINDEX", "TROPONINI"  Lab Results  Component Value Date   CHOL 106 07/15/2022   CHOL 111 12/12/2021   Lab Results  Component Value Date   HDL 50 07/15/2022   HDL 41 12/12/2021   Lab Results  Component Value Date   LDLCALC 36 07/15/2022   LDLCALC 52 12/12/2021   Lab Results  Component Value Date   TRIG 101 07/15/2022   TRIG 88 12/12/2021   Lab Results  Component Value Date   CHOLHDL 2.1 07/15/2022   CHOLHDL 2.7 12/12/2021   No results found  for: "LDLDIRECT"    Radiology: CARDIAC CATHETERIZATION  Result Date: 08/05/2022   Ost RCA to Prox RCA lesion is 75% stenosed.   Ost LM to Mid LM lesion is 40% stenosed.   Prox LAD to Mid LAD lesion is 40% stenosed.   Dist LAD lesion is 100% stenosed.   1st Mrg lesion is 50% stenosed. 1.  Anterolateral STEMI 2.  Two-vessel coronary artery disease with occluded distal LAD and 75% stenosis ostial RCA 3.  Known critical aortic stenosis Recommendations 1.  Medical therapy in the absence of chest pain 2.  Heparin infusion 48 hours 3.  Patient scheduled to have TAVR at [facility]   ECHOCARDIOGRAM COMPLETE  Result Date: 07/20/2022    ECHOCARDIOGRAM REPORT   Patient Name:   Alvino Chapel [facility] Date of Exam: 07/20/2022 Medical Rec #:  [id number]       Height:       64.0 in Accession #:    [id number]      Weight:       231.0 lb Date of Birth:  1940-07-31        BSA:          2.080 m Patient Age:    82 years        BP:           107/49 mmHg Patient Gender: F               HR:           67 bpm. Exam Location:  [facility] Procedure: 2D Echo, Cardiac Doppler and Color Doppler Indications:     CHF--acute diastolic I50.31  History:         Patient has prior history of Echocardiogram examinations, most                  recent 11/17/2021.  CHF.  Sonographer:     Cristela Blue Referring Phys:  [id number] Andris Baumann Diagnosing Phys: Lorine Bears MD IMPRESSIONS  1. Left ventricular ejection fraction, by estimation, is 55 to 60%. The left ventricle has normal function. The left ventricle has no regional wall motion abnormalities. There is mild left ventricular hypertrophy. Left ventricular diastolic parameters are indeterminate.  2. Right ventricular systolic function is normal. The right ventricular size is normal. There is mildly elevated pulmonary artery systolic pressure.  3. Left atrial size was severely dilated.  4. The mitral valve is abnormal. No evidence of mitral valve regurgitation. Moderate mitral stenosis. The mean mitral valve gradient is  7.0 mmHg. Severe mitral annular calcification.  5. Tricuspid valve regurgitation is mild to moderate.  6. The aortic valve is calcified. Aortic valve regurgitation is trivial. Severe aortic valve stenosis. Aortic valve area, by VTI measures 0.82 cm. Aortic valve mean gradient measures 30.2 mmHg. FINDINGS  Left Ventricle: Left ventricular ejection fraction, by estimation, is 55 to 60%. The left ventricle has normal function. The left ventricle has no regional wall motion abnormalities. The left ventricular internal cavity size was normal in size. There is  mild left ventricular hypertrophy. Left ventricular diastolic parameters are indeterminate. Right Ventricle: The right ventricular size is normal. No increase in right ventricular wall thickness. Right ventricular systolic function is normal. There is mildly elevated pulmonary artery systolic pressure. The tricuspid regurgitant velocity is 2.81  m/s, and with an assumed right atrial pressure of 5 mmHg, the estimated right ventricular systolic pressure is 36.6 mmHg. Left Atrium: Left atrial size was severely dilated. Right Atrium: Right atrial size was normal in size. Pericardium: There is no evidence of pericardial effusion. Mitral Valve: The mitral valve is abnormal. Severe mitral annular calcification. No evidence of mitral valve regurgitation. Moderate mitral valve stenosis. MV peak gradient, 21.0 mmHg. The mean mitral valve gradient is 7.0 mmHg. Tricuspid Valve: The tricuspid valve is normal in structure. Tricuspid valve regurgitation is mild to moderate. No evidence of tricuspid stenosis. Aortic Valve: The aortic valve is calcified. Aortic valve regurgitation is trivial. Severe aortic stenosis is present. Aortic valve mean gradient measures 30.2 mmHg. Aortic valve peak gradient measures 52.1 mmHg. Aortic valve area, by VTI measures 0.82 cm. Pulmonic Valve: The pulmonic valve was normal in structure. Pulmonic valve regurgitation is not visualized. No evidence  of pulmonic stenosis. Aorta: The aortic root is normal in size and structure. Venous: The inferior vena cava was not well visualized. IAS/Shunts: No atrial level shunt detected by color flow Doppler.  LEFT VENTRICLE PLAX 2D LVIDd:         4.70 cm   Diastology LVIDs:         3.10 cm   LV e' medial:    8.92 cm/s LV PW:         1.50 cm   LV E/e' medial:  21.0 LV IVS:        0.90 cm   LV e' lateral:   7.62 cm/s LVOT diam:     2.00 cm   LV E/e' lateral: 24.5 LV SV:         65 LV SV Index:   31 LVOT Area:     3.14 cm  RIGHT VENTRICLE RV Basal diam:  2.90 cm RV Mid diam:    3.40 cm RV S prime:     10.20 cm/s TAPSE (M-mode): 1.5 cm LEFT ATRIUM            Index  RIGHT ATRIUM           Index LA diam:      5.10 cm  2.45 cm/m   RA Area:     15.30 cm LA Vol (A2C): 65.3 ml  31.40 ml/m  RA Volume:   33.20 ml  15.96 ml/m LA Vol (A4C): 116.0 ml 55.77 ml/m  AORTIC VALVE AV Area (Vmax):    0.75 cm AV Area (Vmean):   0.77 cm AV Area (VTI):     0.82 cm AV Vmax:           360.75 cm/s AV Vmean:          254.250 cm/s AV VTI:            0.788 m AV Peak Grad:      52.1 mmHg AV Mean Grad:      30.2 mmHg LVOT Vmax:         86.20 cm/s LVOT Vmean:        62.700 cm/s LVOT VTI:          0.207 m LVOT/AV VTI ratio: 0.26  AORTA Ao Root diam: 2.70 cm MITRAL VALVE                TRICUSPID VALVE MV Area (PHT): 3.39 cm     TR Peak grad:   31.6 mmHg MV Area VTI:   1.15 cm     TR Vmax:        281.00 cm/s MV Peak grad:  21.0 mmHg MV Mean grad:  7.0 mmHg     SHUNTS MV Vmax:       2.29 m/s     Systemic VTI:  0.21 m MV Vmean:      118.0 cm/s   Systemic Diam: 2.00 cm MV Decel Time: 224 msec MV E velocity: 187.00 cm/s MV A velocity: 98.50 cm/s MV E/A ratio:  1.90 Lorine Bears MD Electronically signed by Lorine Bears MD Signature Date/Time: 07/20/2022/5:37:11 PM    Final    DG Chest 2 View  Result Date: 07/18/2022 CLINICAL DATA:  Bilateral leg swelling. EXAM: CHEST - 2 VIEW COMPARISON:  February 18, 2022 FINDINGS: There is stable mild to  moderate severity enlargement of the cardiac silhouette. Marked severity calcification of the aortic arch is seen. Low lung volumes are noted with stable elevation of the right hemidiaphragm. Mild, diffuse, chronic appearing increased interstitial lung markings are present. There is no evidence of focal consolidation, pleural effusion or pneumothorax. Multilevel degenerative changes are seen throughout the thoracic spine. IMPRESSION: Chronic appearing increased interstitial lung markings without evidence of acute or active cardiopulmonary disease. Electronically Signed   By: Aram Candela M.D.   On: 07/18/2022 23:46    EKG: Sinus rhythm with 1 to 2 mm ST elevations leads V3 through V4  ASSESSMENT AND PLAN:   1.  Anterolateral STEMI, coronary angiography revealing occluded distal small caliber LAD, 75% stenosis ostial RCA, without chest pain, treated medically.  Prior cardiac catheterization at [facility] 02/04/2021 also revealed 80% stenosis ostial RCA which was treated medically. 2.  Severe aortic stenosis, calculated aortic valve area 0.82 cm mean gradient 30.2 mmHg by 2D echocardiogram 07/20/2022, scheduled to undergo evaluation for TAVR at [facility] 3.  Chronic atrial fibrillation, not on chronic anticoagulation, with history of upper GI bleed and chronic anemia 4.  CKD stage IIIb 6.  Peripheral vascular disease, status post ileal endarterectomy and thrombectomy 11/16/2021 on clopidogrel  Recommendations  1.  Continue heparin drip 24-48 hours 2.  Continue antiplatelet therapy 3.  Repeat 2D echocardiogram 4.  Inpatient versus outpatient evaluation for TAVR at [facility]  Signed: Marcina Millard MD,PhD, [facility] 08/05/2022, 11:11 AM

## 2022-08-05 NOTE — Consult Note (Signed)
ANTICOAGULATION CONSULT NOTE - Initial Consult  Pharmacy Consult for heparin gtt Indication: chest pain/ACS  Allergies  Allergen Reactions   Morphine Hives and Itching    Other Reaction(s): Confusion   Other Hives, Itching and Other (See Comments)    Other Reaction(s): Confusion   Morphine And Codeine Itching   Kenalog [Triamcinolone Acetonide] Other (See Comments)    FLUSH IN FACE   Penicillins Other (See Comments)    Has patient had a PCN reaction causing immediate rash, facial/tongue/throat swelling, SOB or lightheadedness with hypotension: Unknown Has patient had a PCN reaction causing severe rash involving mucus membranes or skin necrosis: Unknown Has patient had a PCN reaction that required hospitalization:No Has patient had a PCN reaction occurring within the last 10 years: No If all of the above answers are "NO", then may proceed with Cephalosporin use.    Latex Rash   Povidone-Iodine Rash    burning    Patient Measurements: Height: 5\' 4"  (162.6 cm) Weight: 99.8 kg (220 lb) IBW/kg (Calculated) : 54.7 Heparin Dosing Weight: 77.8 kg  Vital Signs: Temp: 97.6 F (36.4 C) (05/29 0948) Temp Source: Oral (05/29 0948) Pulse Rate: 68 (05/29 0947)  Labs: No results for input(s): "HGB", "HCT", "PLT", "APTT", "LABPROT", "INR", "HEPARINUNFRC", "HEPRLOWMOCWT", "CREATININE", "CKTOTAL", "CKMB", "TROPONINIHS" in the last 72 hours.  Estimated Creatinine Clearance: 22.7 mL/min (A) (by C-G formula based on SCr of 2.19 mg/dL (H)).   Medical History: Past Medical History:  Diagnosis Date   Anemia    Arrhythmia    atrial fibrillation   CHF (congestive heart failure) (HCC)    Chronic kidney disease    Depression    HBP (high blood pressure)    History of bladder problems    Memory loss    Muscle pain    Osteoporosis    Reflux    Sinus congestion    Swelling    B/L FEET AND LEGS    Medications:  NO AC prior to admission  Assessment: PMH includes HTN, dCHF (EF 65 to  70% 11/2021), moderate AAS CKD-3B, atrial fibrillation and DVT not on anticoagulants, CAD, multifactorial anemia secondary to CKD, recurrent GI bleed . Code STEMI activated. Pharmacy consulted to manage heparin infusion. Goal of Therapy:  Heparin level 0.3-0.7 units/ml Monitor platelets by anticoagulation protocol: Yes   Plan:  Give 4000 units bolus x 1, ordered an give at ED Start heparin infusion at 1000 units/hr Check anti-Xa level in 6 hours and daily while on heparin Continue to monitor H&H and platelets  Marlean Mortell Rodriguez-Guzman PharmD, BCPS 08/05/2022 9:58 AM

## 2022-08-05 NOTE — H&P (Signed)
History and Physical    Patient: Rebecca Gilmore ZOX:[id number] DOB: February 01, 1941 DOA: 08/05/2022 DOS: the patient was seen and examined on 08/05/2022 PCP: Ailene Ravel, MD  Patient coming from: Home  Chief Complaint:  Chief Complaint  Patient presents with   Chest Pain   HPI: Rebecca Gilmore is a 82 y.o. female with medical history significant of chronic atrial fibrillation, chronic diastolic congestive heart failure, chronic kidney disease stage IIIb, essential hypertension, who present to the hospital with chest pain. EKG showed ST elevation, patient was brought to the Cath Lab, which showed occluded distal LAD, and a 75% stenosis in his ostial RCA.  Patient was placed on aspirin, Plavix and heparin drip. Patient has been accepted to transfer to [facility].  Currently pending transport. Review of Systems: As mentioned in the history of present illness. All other systems reviewed and are negative. Past Medical History:  Diagnosis Date   Anemia    Arrhythmia    atrial fibrillation   CHF (congestive heart failure) (HCC)    Chronic kidney disease    Depression    HBP (high blood pressure)    History of bladder problems    Memory loss    Muscle pain    Osteoporosis    Reflux    Sinus congestion    Swelling    B/L FEET AND LEGS   Past Surgical History:  Procedure Laterality Date   ARTERY BIOPSY Bilateral 12/01/2016   Procedure: BIOPSY TEMPORAL ARTERY;  Surgeon: Fransisco Hertz, MD;  Location: [facility] OR;  Service: Vascular;  Laterality: Bilateral;   CATARACT SURGERY     COLONOSCOPY WITH PROPOFOL N/A 12/13/2021   Procedure: COLONOSCOPY WITH PROPOFOL;  Surgeon: Wyline Mood, MD;  Location: [facility] ENDOSCOPY;  Service: Gastroenterology;  Laterality: N/A;   ESOPHAGOGASTRODUODENOSCOPY (EGD) WITH PROPOFOL N/A 12/12/2021   Procedure: ESOPHAGOGASTRODUODENOSCOPY (EGD) WITH PROPOFOL;  Surgeon: Jaynie Collins, DO;  Location: [facility] ENDOSCOPY;  Service: Gastroenterology;  Laterality: N/A;    EYELID SURGERY Bilateral    GIVENS CAPSULE STUDY N/A 12/15/2021   Procedure: GIVENS CAPSULE STUDY;  Surgeon: Jaynie Collins, DO;  Location: [facility] ENDOSCOPY;  Service: Gastroenterology;  Laterality: N/A;   GIVENS CAPSULE STUDY N/A 01/28/2022   Procedure: GIVENS CAPSULE STUDY;  Surgeon: Toney Reil, MD;  Location: [facility] ENDOSCOPY;  Service: Gastroenterology;  Laterality: N/A;   GROWTH ON FACE     KNEE SURGERY Right    THROMBECTOMY FEMORAL ARTERY Left 11/16/2021   Procedure: THROMBECTOMY FEMORAL ARTERY;  Surgeon: Fransisco Hertz, MD;  Location: [facility] ORS;  Service: Vascular;  Laterality: Left;   Social History:  reports that she quit smoking about 29 years ago. Her smoking use included cigarettes. She has quit using smokeless tobacco.  Her smokeless tobacco use included snuff and chew. She reports that she does not drink alcohol and does not use drugs.  Allergies  Allergen Reactions   Morphine Hives and Itching    Other Reaction(s): Confusion   Other Hives, Itching and Other (See Comments)    Other Reaction(s): Confusion   Morphine And Codeine Itching   Kenalog [Triamcinolone Acetonide] Other (See Comments)    FLUSH IN FACE   Penicillins Other (See Comments)    Has patient had a PCN reaction causing immediate rash, facial/tongue/throat swelling, SOB or lightheadedness with hypotension: Unknown Has patient had a PCN reaction causing severe rash involving mucus membranes or skin necrosis: Unknown Has patient had a PCN reaction that required hospitalization:No Has patient had a PCN reaction  occurring within the last 10 years: No If all of the above answers are "NO", then may proceed with Cephalosporin use.    Latex Rash   Povidone-Iodine Rash    burning    Family History  Problem Relation Age of Onset   Heart disease Mother    AAA (abdominal aortic aneurysm) Mother    Heart disease Father     Prior to Admission medications   Medication Sig Start Date End Date Taking?  Authorizing Provider  potassium chloride (MICRO-K) 10 MEQ CR capsule Take 20 mEq by mouth daily. 07/29/22 07/29/23 Yes [provider]  acetaminophen (TYLENOL) 325 MG tablet Take 2 tablets (650 mg total) by mouth every 6 (six) hours as needed for mild pain or fever. 12/15/21   Sunnie Nielsen, DO  amitriptyline (ELAVIL) 25 MG tablet Take 25 mg by mouth at bedtime as needed. 07/06/21   [provider]  aspirin 81 MG chewable tablet Chew 1 tablet (81 mg total) by mouth daily. 08/06/22   Marrion Coy, MD  atorvastatin (LIPITOR) 80 MG tablet Take 1 tablet (80 mg total) by mouth daily. 08/05/22   Marrion Coy, MD  cholecalciferol (VITAMIN D3) 25 MCG (1000 UT) tablet Take 1,000 Units by mouth daily.    [provider]  clopidogrel (PLAVIX) 75 MG tablet Take 75 mg by mouth every other day.    [provider]  cyanocobalamin (VITAMIN B12) 1000 MCG tablet Take 1,000 mcg by mouth daily.    [provider]  dapagliflozin propanediol (FARXIGA) 10 MG TABS tablet Take 1 tablet (10 mg total) by mouth daily. 07/15/22   Laurey Morale, MD  Ferrous Sulfate (IRON) 325 (65 Fe) MG TABS Take 1 tablet by mouth daily. 09/07/21   [provider]  heparin 16109 UT/250ML infusion Inject 1,000 Units/hr into the vein continuous. 08/05/22   Marrion Coy, MD  loratadine (CLARITIN) 10 MG tablet Take 10 mg by mouth daily as needed.    [provider]  metolazone (ZAROXOLYN) 2.5 MG tablet Take 1 tablet (2.5 mg total) by mouth daily. Hold this medication until you see your cardiologist 07/22/22 10/20/22  Charise Killian, MD  midodrine (PROAMATINE) 10 MG tablet Take 1 tablet (10 mg total) by mouth 3 (three) times daily with meals for 14 days. 07/22/22 08/05/22  Charise Killian, MD  pantoprazole (PROTONIX) 40 MG tablet Take 40 mg by mouth at bedtime. 10/01/21   [provider]  potassium chloride SA (KLOR-CON M) 20 MEQ tablet Take 3 tablets (60 mEq total) by mouth  daily. Take an extra 2 tablets on your metolazone days. Hold this medication until you see your cardiologist 07/22/22   Charise Killian, MD  pregabalin (LYRICA) 75 MG capsule Take 1 capsule (75 mg total) by mouth daily. 11/25/21   Georgiana Spinner, NP  rosuvastatin (CRESTOR) 5 MG tablet Take 5 mg by mouth at bedtime. 04/06/19   [provider]  sertraline (ZOLOFT) 50 MG tablet Take 50 mg by mouth daily. 10/14/17   [provider]  torsemide (DEMADEX) 20 MG tablet Take 4 tablets (80 mg total) by mouth 2 (two) times daily. Hold this medication until you see your cardiologist 07/22/22 10/20/22  Charise Killian, MD    Physical Exam: Vitals:   08/05/22 1045 08/05/22 1114 08/05/22 1130 08/05/22 1200  BP: (!) 102/51 (!) 124/49 (!) 125/55 (!) 114/55  Pulse: 66 69 65 63  Resp: 20 (!) 26 (!) 22 19  Temp:  TempSrc:  Oral    SpO2: 91% 93% 97% 100%  Weight:      Height:       Physical Exam Constitutional:      General: She is not in acute distress.    Appearance: She is well-developed. She is obese. She is not ill-appearing.  HENT:     Head: Normocephalic and atraumatic.  Eyes:     Pupils: Pupils are equal, round, and reactive to light.  Neck:     Thyroid: No thyromegaly.     Vascular: No hepatojugular reflux or JVD.  Cardiovascular:     Rate and Rhythm: Normal rate and regular rhythm.     Heart sounds: Murmur heard.     Systolic murmur is present with a grade of 2/6.  Pulmonary:     Effort: Pulmonary effort is normal. No tachypnea.     Breath sounds: Normal breath sounds.  Abdominal:     General: Bowel sounds are normal. There is no abdominal bruit.     Palpations: Abdomen is soft. There is no hepatomegaly or mass.     Tenderness: There is no abdominal tenderness.  Musculoskeletal:        General: Normal range of motion.     Cervical back: Normal range of motion.     Right lower leg: No edema.     Left lower leg: No edema.  Lymphadenopathy:     Cervical: No  cervical adenopathy.  Skin:    General: Skin is warm and dry.  Neurological:     General: No focal deficit present.     Mental Status: She is alert and oriented to person, place, and time.  Psychiatric:        Mood and Affect: Mood normal.        Behavior: Behavior normal.     Data Reviewed:  Lab test reviewed hemoglobin 9.0, creatinine 1.62, troponin peak 5080.  LDL 57, HDL 50. Heart cath results as above.  Assessment and Plan: STEMI with two-vessel disease. Status post heart cath, pending transfer to [facility] for further intervention. Continue heparin drip, aspirin Plavix and Lipitor.  Severe aortic stenosis. Moderate mitral valve stenosis. Follow-up with [facility] for TVAR  Chronic atrial fibrillation. Heart rate under control.  Chronic kidney disease stage IV. Renal function is stable.    Advance Care Planning:   Code Status: Full Code patient is a full code.  Consults: Cardiology  Family Communication: Daughter updated at bedside.  Severity of Illness: The appropriate patient status for this patient is INPATIENT. Inpatient status is judged to be reasonable and necessary in order to provide the required intensity of service to ensure the patient's safety. The patient's presenting symptoms, physical exam findings, and initial radiographic and laboratory data in the context of their chronic comorbidities is felt to place them at high risk for further clinical deterioration. Furthermore, it is not anticipated that the patient will be medically stable for discharge from the hospital within 2 midnights of admission.   * I certify that at the point of admission it is my clinical judgment that the patient will require inpatient hospital care spanning beyond 2 midnights from the point of admission due to high intensity of service, high risk for further deterioration and high frequency of surveillance required.* She will be transferred to [facility] for further  treatment.  Author: Marrion Coy, MD 08/05/2022 2:01 PM  For on call review [email or web address].

## 2022-08-05 NOTE — ED Notes (Signed)
To CL with RN on zoll monitor with cardiologist, no changes. Alert, NAD, calm.

## 2022-08-05 NOTE — ED Provider Notes (Signed)
[facility] Provider Note    Event Date/Time   First MD Initiated Contact with Patient 08/05/22 [phone]     (approximate)   History   Chest Pain   HPI  Rebecca Gilmore is a 82 y.o. female   Past medical history of severe aortic stenosis , CKD who presents to the emergency department with sudden onset chest pain with minimal exertion and eating this morning radiating to the left arm.  Took a nitro after which EMS stated that her blood pressure dropped from 150-100.  EMS gave asa.   Arrives with some ongoing improved left shoulder discomfort.  STEMI diagnosis discussed with EMS prior to arrival on their transmitted EKG.  Dr. Darrold Junker of cardiology immediately at bedside and taken to Cath Lab, aspirin given by EMS and heparin bolus given by our ED providers.       Physical Exam   Triage Vital Signs: ED Triage Vitals  Enc Vitals Group     BP --      Pulse Rate 08/05/22 0947 68     Resp 08/05/22 0946 13     Temp 08/05/22 0948 97.6 F (36.4 C)     Temp Source 08/05/22 0948 Oral     SpO2 08/05/22 0938 98 %     Weight 08/05/22 0951 220 lb (99.8 kg)     Height 08/05/22 0951 5\' 4"  (1.626 m)     Head Circumference --      Peak Flow --      Pain Score 08/05/22 0951 4     Pain Loc --      Pain Edu? --      Excl. in GC? --     Most recent vital signs: Vitals:   08/05/22 0956 08/05/22 0957  BP:    Pulse: 68 67  Resp: 17   Temp:    SpO2: 99% 100%    General: Awake, no distress.  CV:  Good peripheral perfusion.  Resp:  Normal effort.  Abd:  No distention.  Other:  Awake alert pleasant comfortable appearing with normal hemodynamics soft nontender abdomen and radial pulses are palpable and equal bilateral wrist, clear lungs without wheezing or rales   ED Results / Procedures / Treatments   Labs (all labs ordered are listed, but only abnormal results are displayed) Labs Reviewed  CBC  BASIC METABOLIC PANEL  APTT  PROTIME-INR  SAMPLE TO  BLOOD BANK      EKG  ED ECG REPORT I, Pilar Jarvis, the attending physician, personally viewed and interpreted this ECG.   Date: 08/05/2022  EKG Time: 0946  Rate: 68  Rhythm: sinus  Intervals:rbbb  ST&T Change: STEMI anterolatereal      PROCEDURES:  Critical Care performed: Yes, see critical care procedure note(s)  .Critical Care  Performed by: Pilar Jarvis, MD Authorized by: Pilar Jarvis, MD   Critical care provider statement:    Critical care time (minutes):  30   Critical care was time spent personally by me on the following activities:  Development of treatment plan with patient or surrogate, discussions with consultants, evaluation of patient's response to treatment, examination of patient, ordering and review of laboratory studies, ordering and review of radiographic studies, ordering and performing treatments and interventions, pulse oximetry, re-evaluation of patient's condition and review of old charts    MEDICATIONS ORDERED IN ED: Medications  heparin ADULT infusion 100 units/mL (25000 units/253mL) (has no administration in time range)  heparin injection 4,000 Units (4,000 Units Intravenous  Given 08/05/22 0956)    External physician / consultants:  I spoke with cardiology Paraschos regarding care plan for this patient.   IMPRESSION / MDM / ASSESSMENT AND PLAN / ED COURSE  I reviewed the triage vital signs and the nursing notes.                                Patient's presentation is most consistent with acute presentation with potential threat to life or bodily function.  Differential diagnosis includes, but is not limited to, STEMI ACS dissection pe    The patient is on the cardiac monitor to evaluate for evidence of arrhythmia and/or significant heart rate changes.  MDM: Anterolateral STEMI taken immediately to Cath Lab by Dr. Darrold Junker and cardiology team.  Remained hemodynamically stable in the emergency department with ongoing left shoulder  discomfort        FINAL CLINICAL IMPRESSION(S) / ED DIAGNOSES   Final diagnoses:  ST elevation myocardial infarction (STEMI), unspecified artery (HCC)     Rx / DC Orders   ED Discharge Orders     None        Note:  This document was prepared using Dragon voice recognition software and may include unintentional dictation errors.    Pilar Jarvis, MD 08/05/22 1006

## 2022-08-05 NOTE — Progress Notes (Signed)
  Chaplain On-Call responded to Code STEMI notification at 0940 hours.  The patient was examined in ED room 19 by the medical team. She was taken promptly to the Cath Lab.  There is no family present. Chaplain assured ED Staff of availability as needed.  Chaplain Evelena Peat M.Div., [facility]

## 2022-08-05 NOTE — ED Notes (Addendum)
EDP at [facility], STEMI not activated in field, activated now, pending card arrival. Pt alert, NAD, calm, interactive, speech clear, rates pain 4/10. Labs being drawn.

## 2022-08-06 ENCOUNTER — Encounter: Payer: Self-pay | Admitting: Cardiology

## 2022-08-06 ENCOUNTER — Ambulatory Visit (HOSPITAL_COMMUNITY): Payer: Medicare Other

## 2022-08-06 DIAGNOSIS — Z86718 Personal history of other venous thrombosis and embolism: Secondary | ICD-10-CM | POA: Diagnosis not present

## 2022-08-06 DIAGNOSIS — E785 Hyperlipidemia, unspecified: Secondary | ICD-10-CM | POA: Diagnosis not present

## 2022-08-06 DIAGNOSIS — I350 Nonrheumatic aortic (valve) stenosis: Secondary | ICD-10-CM | POA: Diagnosis not present

## 2022-08-06 DIAGNOSIS — Z950 Presence of cardiac pacemaker: Secondary | ICD-10-CM | POA: Diagnosis not present

## 2022-08-06 DIAGNOSIS — I5032 Chronic diastolic (congestive) heart failure: Secondary | ICD-10-CM | POA: Diagnosis not present

## 2022-08-06 DIAGNOSIS — J9811 Atelectasis: Secondary | ICD-10-CM | POA: Diagnosis not present

## 2022-08-06 DIAGNOSIS — I25118 Atherosclerotic heart disease of native coronary artery with other forms of angina pectoris: Secondary | ICD-10-CM | POA: Diagnosis not present

## 2022-08-06 DIAGNOSIS — N183 Chronic kidney disease, stage 3 unspecified: Secondary | ICD-10-CM | POA: Diagnosis not present

## 2022-08-06 DIAGNOSIS — I739 Peripheral vascular disease, unspecified: Secondary | ICD-10-CM | POA: Diagnosis not present

## 2022-08-06 DIAGNOSIS — I129 Hypertensive chronic kidney disease with stage 1 through stage 4 chronic kidney disease, or unspecified chronic kidney disease: Secondary | ICD-10-CM | POA: Diagnosis not present

## 2022-08-06 DIAGNOSIS — I214 Non-ST elevation (NSTEMI) myocardial infarction: Secondary | ICD-10-CM | POA: Diagnosis not present

## 2022-08-06 DIAGNOSIS — I442 Atrioventricular block, complete: Secondary | ICD-10-CM | POA: Diagnosis not present

## 2022-08-06 DIAGNOSIS — I2102 ST elevation (STEMI) myocardial infarction involving left anterior descending coronary artery: Secondary | ICD-10-CM | POA: Diagnosis not present

## 2022-08-06 DIAGNOSIS — I4891 Unspecified atrial fibrillation: Secondary | ICD-10-CM | POA: Diagnosis not present

## 2022-08-06 DIAGNOSIS — N1832 Chronic kidney disease, stage 3b: Secondary | ICD-10-CM | POA: Diagnosis not present

## 2022-08-06 DIAGNOSIS — Z87891 Personal history of nicotine dependence: Secondary | ICD-10-CM | POA: Diagnosis not present

## 2022-08-06 DIAGNOSIS — I342 Nonrheumatic mitral (valve) stenosis: Secondary | ICD-10-CM | POA: Diagnosis not present

## 2022-08-06 DIAGNOSIS — I130 Hypertensive heart and chronic kidney disease with heart failure and stage 1 through stage 4 chronic kidney disease, or unspecified chronic kidney disease: Secondary | ICD-10-CM | POA: Diagnosis not present

## 2022-08-06 DIAGNOSIS — Z452 Encounter for adjustment and management of vascular access device: Secondary | ICD-10-CM | POA: Diagnosis not present

## 2022-08-06 LAB — HEMOGLOBIN A1C
Hgb A1c MFr Bld: 4.7 % — ABNORMAL LOW (ref 4.8–5.6)
Mean Plasma Glucose: 88 mg/dL

## 2022-08-07 ENCOUNTER — Encounter: Payer: Medicare Other | Admitting: Cardiology

## 2022-08-07 DIAGNOSIS — I442 Atrioventricular block, complete: Secondary | ICD-10-CM | POA: Diagnosis not present

## 2022-08-07 DIAGNOSIS — Z452 Encounter for adjustment and management of vascular access device: Secondary | ICD-10-CM | POA: Diagnosis not present

## 2022-08-07 DIAGNOSIS — I130 Hypertensive heart and chronic kidney disease with heart failure and stage 1 through stage 4 chronic kidney disease, or unspecified chronic kidney disease: Secondary | ICD-10-CM | POA: Diagnosis not present

## 2022-08-07 DIAGNOSIS — I342 Nonrheumatic mitral (valve) stenosis: Secondary | ICD-10-CM | POA: Diagnosis not present

## 2022-08-07 DIAGNOSIS — J9811 Atelectasis: Secondary | ICD-10-CM | POA: Diagnosis not present

## 2022-08-07 DIAGNOSIS — Z4682 Encounter for fitting and adjustment of non-vascular catheter: Secondary | ICD-10-CM | POA: Diagnosis not present

## 2022-08-07 DIAGNOSIS — I214 Non-ST elevation (NSTEMI) myocardial infarction: Secondary | ICD-10-CM | POA: Diagnosis not present

## 2022-08-07 DIAGNOSIS — I350 Nonrheumatic aortic (valve) stenosis: Secondary | ICD-10-CM | POA: Diagnosis not present

## 2022-08-08 DIAGNOSIS — I4891 Unspecified atrial fibrillation: Secondary | ICD-10-CM | POA: Diagnosis not present

## 2022-08-08 DIAGNOSIS — I350 Nonrheumatic aortic (valve) stenosis: Secondary | ICD-10-CM | POA: Diagnosis not present

## 2022-08-08 DIAGNOSIS — I739 Peripheral vascular disease, unspecified: Secondary | ICD-10-CM | POA: Diagnosis not present

## 2022-08-08 DIAGNOSIS — N1832 Chronic kidney disease, stage 3b: Secondary | ICD-10-CM | POA: Diagnosis not present

## 2022-08-08 DIAGNOSIS — I5021 Acute systolic (congestive) heart failure: Secondary | ICD-10-CM | POA: Diagnosis not present

## 2022-08-08 DIAGNOSIS — I442 Atrioventricular block, complete: Secondary | ICD-10-CM | POA: Diagnosis not present

## 2022-08-08 DIAGNOSIS — I130 Hypertensive heart and chronic kidney disease with heart failure and stage 1 through stage 4 chronic kidney disease, or unspecified chronic kidney disease: Secondary | ICD-10-CM | POA: Diagnosis not present

## 2022-08-08 DIAGNOSIS — I440 Atrioventricular block, first degree: Secondary | ICD-10-CM | POA: Diagnosis not present

## 2022-08-08 DIAGNOSIS — Z95811 Presence of heart assist device: Secondary | ICD-10-CM | POA: Diagnosis not present

## 2022-08-08 DIAGNOSIS — I342 Nonrheumatic mitral (valve) stenosis: Secondary | ICD-10-CM | POA: Diagnosis not present

## 2022-08-08 DIAGNOSIS — I2102 ST elevation (STEMI) myocardial infarction involving left anterior descending coronary artery: Secondary | ICD-10-CM | POA: Diagnosis not present

## 2022-08-08 DIAGNOSIS — I5032 Chronic diastolic (congestive) heart failure: Secondary | ICD-10-CM | POA: Diagnosis not present

## 2022-08-08 DIAGNOSIS — I4510 Unspecified right bundle-branch block: Secondary | ICD-10-CM | POA: Diagnosis not present

## 2022-08-09 DIAGNOSIS — Z4682 Encounter for fitting and adjustment of non-vascular catheter: Secondary | ICD-10-CM | POA: Diagnosis not present

## 2022-08-09 DIAGNOSIS — I442 Atrioventricular block, complete: Secondary | ICD-10-CM | POA: Diagnosis not present

## 2022-08-09 DIAGNOSIS — I214 Non-ST elevation (NSTEMI) myocardial infarction: Secondary | ICD-10-CM | POA: Diagnosis not present

## 2022-08-09 DIAGNOSIS — J811 Chronic pulmonary edema: Secondary | ICD-10-CM | POA: Diagnosis not present

## 2022-08-09 DIAGNOSIS — I5021 Acute systolic (congestive) heart failure: Secondary | ICD-10-CM | POA: Diagnosis not present

## 2022-08-09 DIAGNOSIS — I130 Hypertensive heart and chronic kidney disease with heart failure and stage 1 through stage 4 chronic kidney disease, or unspecified chronic kidney disease: Secondary | ICD-10-CM | POA: Diagnosis not present

## 2022-08-09 DIAGNOSIS — I2102 ST elevation (STEMI) myocardial infarction involving left anterior descending coronary artery: Secondary | ICD-10-CM | POA: Diagnosis not present

## 2022-08-09 DIAGNOSIS — I5032 Chronic diastolic (congestive) heart failure: Secondary | ICD-10-CM | POA: Diagnosis not present

## 2022-08-09 DIAGNOSIS — I350 Nonrheumatic aortic (valve) stenosis: Secondary | ICD-10-CM | POA: Diagnosis not present

## 2022-08-10 DIAGNOSIS — J90 Pleural effusion, not elsewhere classified: Secondary | ICD-10-CM | POA: Diagnosis not present

## 2022-08-10 DIAGNOSIS — N1832 Chronic kidney disease, stage 3b: Secondary | ICD-10-CM | POA: Diagnosis not present

## 2022-08-10 DIAGNOSIS — I130 Hypertensive heart and chronic kidney disease with heart failure and stage 1 through stage 4 chronic kidney disease, or unspecified chronic kidney disease: Secondary | ICD-10-CM | POA: Diagnosis not present

## 2022-08-10 DIAGNOSIS — L7632 Postprocedural hematoma of skin and subcutaneous tissue following other procedure: Secondary | ICD-10-CM | POA: Diagnosis not present

## 2022-08-10 DIAGNOSIS — I2510 Atherosclerotic heart disease of native coronary artery without angina pectoris: Secondary | ICD-10-CM | POA: Diagnosis not present

## 2022-08-10 DIAGNOSIS — I5032 Chronic diastolic (congestive) heart failure: Secondary | ICD-10-CM | POA: Diagnosis not present

## 2022-08-10 DIAGNOSIS — I350 Nonrheumatic aortic (valve) stenosis: Secondary | ICD-10-CM | POA: Diagnosis not present

## 2022-08-10 DIAGNOSIS — I444 Left anterior fascicular block: Secondary | ICD-10-CM | POA: Diagnosis not present

## 2022-08-10 DIAGNOSIS — I342 Nonrheumatic mitral (valve) stenosis: Secondary | ICD-10-CM | POA: Diagnosis not present

## 2022-08-10 DIAGNOSIS — J811 Chronic pulmonary edema: Secondary | ICD-10-CM | POA: Diagnosis not present

## 2022-08-10 DIAGNOSIS — I2102 ST elevation (STEMI) myocardial infarction involving left anterior descending coronary artery: Secondary | ICD-10-CM | POA: Diagnosis not present

## 2022-08-10 DIAGNOSIS — I4891 Unspecified atrial fibrillation: Secondary | ICD-10-CM | POA: Diagnosis not present

## 2022-08-10 DIAGNOSIS — I4510 Unspecified right bundle-branch block: Secondary | ICD-10-CM | POA: Diagnosis not present

## 2022-08-10 DIAGNOSIS — I700 Atherosclerosis of aorta: Secondary | ICD-10-CM | POA: Diagnosis not present

## 2022-08-10 DIAGNOSIS — I440 Atrioventricular block, first degree: Secondary | ICD-10-CM | POA: Diagnosis not present

## 2022-08-10 DIAGNOSIS — I5021 Acute systolic (congestive) heart failure: Secondary | ICD-10-CM | POA: Diagnosis not present

## 2022-08-10 DIAGNOSIS — I743 Embolism and thrombosis of arteries of the lower extremities: Secondary | ICD-10-CM | POA: Diagnosis not present

## 2022-08-10 DIAGNOSIS — I724 Aneurysm of artery of lower extremity: Secondary | ICD-10-CM | POA: Diagnosis not present

## 2022-08-10 DIAGNOSIS — Z01810 Encounter for preprocedural cardiovascular examination: Secondary | ICD-10-CM | POA: Diagnosis not present

## 2022-08-10 DIAGNOSIS — R0602 Shortness of breath: Secondary | ICD-10-CM | POA: Diagnosis not present

## 2022-08-10 LAB — POCT ACTIVATED CLOTTING TIME: Activated Clotting Time: 440 seconds

## 2022-08-11 DIAGNOSIS — I130 Hypertensive heart and chronic kidney disease with heart failure and stage 1 through stage 4 chronic kidney disease, or unspecified chronic kidney disease: Secondary | ICD-10-CM | POA: Diagnosis not present

## 2022-08-11 DIAGNOSIS — I2510 Atherosclerotic heart disease of native coronary artery without angina pectoris: Secondary | ICD-10-CM | POA: Diagnosis not present

## 2022-08-11 DIAGNOSIS — Z01810 Encounter for preprocedural cardiovascular examination: Secondary | ICD-10-CM | POA: Diagnosis not present

## 2022-08-11 DIAGNOSIS — L7632 Postprocedural hematoma of skin and subcutaneous tissue following other procedure: Secondary | ICD-10-CM | POA: Diagnosis not present

## 2022-08-11 DIAGNOSIS — J810 Acute pulmonary edema: Secondary | ICD-10-CM | POA: Diagnosis not present

## 2022-08-11 DIAGNOSIS — I5021 Acute systolic (congestive) heart failure: Secondary | ICD-10-CM | POA: Diagnosis not present

## 2022-08-11 DIAGNOSIS — I350 Nonrheumatic aortic (valve) stenosis: Secondary | ICD-10-CM | POA: Diagnosis not present

## 2022-08-11 DIAGNOSIS — I2102 ST elevation (STEMI) myocardial infarction involving left anterior descending coronary artery: Secondary | ICD-10-CM | POA: Diagnosis not present

## 2022-08-12 ENCOUNTER — Other Ambulatory Visit: Payer: Medicare Other

## 2022-08-12 DIAGNOSIS — Z452 Encounter for adjustment and management of vascular access device: Secondary | ICD-10-CM | POA: Diagnosis not present

## 2022-08-12 DIAGNOSIS — I724 Aneurysm of artery of lower extremity: Secondary | ICD-10-CM | POA: Diagnosis not present

## 2022-08-12 DIAGNOSIS — I130 Hypertensive heart and chronic kidney disease with heart failure and stage 1 through stage 4 chronic kidney disease, or unspecified chronic kidney disease: Secondary | ICD-10-CM | POA: Diagnosis not present

## 2022-08-12 DIAGNOSIS — Z006 Encounter for examination for normal comparison and control in clinical research program: Secondary | ICD-10-CM | POA: Diagnosis not present

## 2022-08-12 DIAGNOSIS — I214 Non-ST elevation (NSTEMI) myocardial infarction: Secondary | ICD-10-CM | POA: Diagnosis not present

## 2022-08-12 DIAGNOSIS — Z4682 Encounter for fitting and adjustment of non-vascular catheter: Secondary | ICD-10-CM | POA: Diagnosis not present

## 2022-08-12 DIAGNOSIS — R918 Other nonspecific abnormal finding of lung field: Secondary | ICD-10-CM | POA: Diagnosis not present

## 2022-08-12 DIAGNOSIS — I5021 Acute systolic (congestive) heart failure: Secondary | ICD-10-CM | POA: Diagnosis not present

## 2022-08-12 DIAGNOSIS — I350 Nonrheumatic aortic (valve) stenosis: Secondary | ICD-10-CM | POA: Diagnosis not present

## 2022-08-12 DIAGNOSIS — I2102 ST elevation (STEMI) myocardial infarction involving left anterior descending coronary artery: Secondary | ICD-10-CM | POA: Diagnosis not present

## 2022-08-12 DIAGNOSIS — I2510 Atherosclerotic heart disease of native coronary artery without angina pectoris: Secondary | ICD-10-CM | POA: Diagnosis not present

## 2022-08-12 DIAGNOSIS — Z952 Presence of prosthetic heart valve: Secondary | ICD-10-CM | POA: Diagnosis not present

## 2022-08-13 DIAGNOSIS — I350 Nonrheumatic aortic (valve) stenosis: Secondary | ICD-10-CM | POA: Diagnosis not present

## 2022-08-13 DIAGNOSIS — Z952 Presence of prosthetic heart valve: Secondary | ICD-10-CM | POA: Diagnosis not present

## 2022-08-13 DIAGNOSIS — J90 Pleural effusion, not elsewhere classified: Secondary | ICD-10-CM | POA: Diagnosis not present

## 2022-08-14 DIAGNOSIS — I5033 Acute on chronic diastolic (congestive) heart failure: Secondary | ICD-10-CM | POA: Diagnosis not present

## 2022-08-14 DIAGNOSIS — N1832 Chronic kidney disease, stage 3b: Secondary | ICD-10-CM | POA: Diagnosis not present

## 2022-08-14 DIAGNOSIS — I130 Hypertensive heart and chronic kidney disease with heart failure and stage 1 through stage 4 chronic kidney disease, or unspecified chronic kidney disease: Secondary | ICD-10-CM | POA: Diagnosis not present

## 2022-08-14 DIAGNOSIS — I214 Non-ST elevation (NSTEMI) myocardial infarction: Secondary | ICD-10-CM | POA: Diagnosis not present

## 2022-08-14 DIAGNOSIS — Z952 Presence of prosthetic heart valve: Secondary | ICD-10-CM | POA: Diagnosis not present

## 2022-08-14 DIAGNOSIS — R0689 Other abnormalities of breathing: Secondary | ICD-10-CM | POA: Diagnosis not present

## 2022-08-14 DIAGNOSIS — E785 Hyperlipidemia, unspecified: Secondary | ICD-10-CM | POA: Diagnosis not present

## 2022-08-15 DIAGNOSIS — Z01810 Encounter for preprocedural cardiovascular examination: Secondary | ICD-10-CM | POA: Diagnosis not present

## 2022-08-15 DIAGNOSIS — Z952 Presence of prosthetic heart valve: Secondary | ICD-10-CM | POA: Diagnosis not present

## 2022-08-15 DIAGNOSIS — L7632 Postprocedural hematoma of skin and subcutaneous tissue following other procedure: Secondary | ICD-10-CM | POA: Diagnosis not present

## 2022-08-16 DIAGNOSIS — I442 Atrioventricular block, complete: Secondary | ICD-10-CM | POA: Diagnosis not present

## 2022-08-16 DIAGNOSIS — I517 Cardiomegaly: Secondary | ICD-10-CM | POA: Diagnosis not present

## 2022-08-16 DIAGNOSIS — I342 Nonrheumatic mitral (valve) stenosis: Secondary | ICD-10-CM | POA: Diagnosis not present

## 2022-08-16 DIAGNOSIS — I2102 ST elevation (STEMI) myocardial infarction involving left anterior descending coronary artery: Secondary | ICD-10-CM | POA: Diagnosis not present

## 2022-08-16 DIAGNOSIS — Z4682 Encounter for fitting and adjustment of non-vascular catheter: Secondary | ICD-10-CM | POA: Diagnosis not present

## 2022-08-16 DIAGNOSIS — N179 Acute kidney failure, unspecified: Secondary | ICD-10-CM | POA: Diagnosis not present

## 2022-08-16 DIAGNOSIS — J9811 Atelectasis: Secondary | ICD-10-CM | POA: Diagnosis not present

## 2022-08-16 DIAGNOSIS — I214 Non-ST elevation (NSTEMI) myocardial infarction: Secondary | ICD-10-CM | POA: Diagnosis not present

## 2022-08-16 DIAGNOSIS — J90 Pleural effusion, not elsewhere classified: Secondary | ICD-10-CM | POA: Diagnosis not present

## 2022-08-16 DIAGNOSIS — I255 Ischemic cardiomyopathy: Secondary | ICD-10-CM | POA: Diagnosis not present

## 2022-08-16 DIAGNOSIS — R001 Bradycardia, unspecified: Secondary | ICD-10-CM | POA: Diagnosis not present

## 2022-08-16 DIAGNOSIS — Z452 Encounter for adjustment and management of vascular access device: Secondary | ICD-10-CM | POA: Diagnosis not present

## 2022-08-16 DIAGNOSIS — N184 Chronic kidney disease, stage 4 (severe): Secondary | ICD-10-CM | POA: Diagnosis not present

## 2022-08-16 DIAGNOSIS — Z955 Presence of coronary angioplasty implant and graft: Secondary | ICD-10-CM | POA: Diagnosis not present

## 2022-08-16 DIAGNOSIS — J811 Chronic pulmonary edema: Secondary | ICD-10-CM | POA: Diagnosis not present

## 2022-08-16 DIAGNOSIS — Z952 Presence of prosthetic heart valve: Secondary | ICD-10-CM | POA: Diagnosis not present

## 2022-08-16 DIAGNOSIS — I5023 Acute on chronic systolic (congestive) heart failure: Secondary | ICD-10-CM | POA: Diagnosis not present

## 2022-08-17 DIAGNOSIS — R5383 Other fatigue: Secondary | ICD-10-CM | POA: Diagnosis not present

## 2022-08-17 DIAGNOSIS — I442 Atrioventricular block, complete: Secondary | ICD-10-CM | POA: Diagnosis not present

## 2022-08-17 DIAGNOSIS — I2510 Atherosclerotic heart disease of native coronary artery without angina pectoris: Secondary | ICD-10-CM | POA: Diagnosis not present

## 2022-08-17 DIAGNOSIS — R0602 Shortness of breath: Secondary | ICD-10-CM | POA: Diagnosis not present

## 2022-08-17 DIAGNOSIS — I453 Trifascicular block: Secondary | ICD-10-CM | POA: Diagnosis not present

## 2022-08-17 DIAGNOSIS — R54 Age-related physical debility: Secondary | ICD-10-CM | POA: Diagnosis not present

## 2022-08-17 DIAGNOSIS — I342 Nonrheumatic mitral (valve) stenosis: Secondary | ICD-10-CM | POA: Diagnosis not present

## 2022-08-17 DIAGNOSIS — I5033 Acute on chronic diastolic (congestive) heart failure: Secondary | ICD-10-CM | POA: Diagnosis not present

## 2022-08-17 DIAGNOSIS — Z952 Presence of prosthetic heart valve: Secondary | ICD-10-CM | POA: Diagnosis not present

## 2022-08-17 DIAGNOSIS — Z955 Presence of coronary angioplasty implant and graft: Secondary | ICD-10-CM | POA: Diagnosis not present

## 2022-08-18 DIAGNOSIS — R0602 Shortness of breath: Secondary | ICD-10-CM | POA: Diagnosis not present

## 2022-08-18 DIAGNOSIS — I453 Trifascicular block: Secondary | ICD-10-CM | POA: Diagnosis not present

## 2022-08-18 DIAGNOSIS — I342 Nonrheumatic mitral (valve) stenosis: Secondary | ICD-10-CM | POA: Diagnosis not present

## 2022-08-18 DIAGNOSIS — Z955 Presence of coronary angioplasty implant and graft: Secondary | ICD-10-CM | POA: Diagnosis not present

## 2022-08-18 DIAGNOSIS — R5383 Other fatigue: Secondary | ICD-10-CM | POA: Diagnosis not present

## 2022-08-18 DIAGNOSIS — I5033 Acute on chronic diastolic (congestive) heart failure: Secondary | ICD-10-CM | POA: Diagnosis not present

## 2022-08-18 DIAGNOSIS — I2510 Atherosclerotic heart disease of native coronary artery without angina pectoris: Secondary | ICD-10-CM | POA: Diagnosis not present

## 2022-08-18 DIAGNOSIS — R54 Age-related physical debility: Secondary | ICD-10-CM | POA: Diagnosis not present

## 2022-08-18 DIAGNOSIS — I442 Atrioventricular block, complete: Secondary | ICD-10-CM | POA: Diagnosis not present

## 2022-08-18 DIAGNOSIS — Z952 Presence of prosthetic heart valve: Secondary | ICD-10-CM | POA: Diagnosis not present

## 2022-08-19 DIAGNOSIS — R0602 Shortness of breath: Secondary | ICD-10-CM | POA: Diagnosis not present

## 2022-08-19 DIAGNOSIS — Z955 Presence of coronary angioplasty implant and graft: Secondary | ICD-10-CM | POA: Diagnosis not present

## 2022-08-19 DIAGNOSIS — I5022 Chronic systolic (congestive) heart failure: Secondary | ICD-10-CM | POA: Diagnosis not present

## 2022-08-19 DIAGNOSIS — I2510 Atherosclerotic heart disease of native coronary artery without angina pectoris: Secondary | ICD-10-CM | POA: Diagnosis not present

## 2022-08-19 DIAGNOSIS — Z952 Presence of prosthetic heart valve: Secondary | ICD-10-CM | POA: Diagnosis not present

## 2022-08-19 DIAGNOSIS — I420 Dilated cardiomyopathy: Secondary | ICD-10-CM | POA: Diagnosis not present

## 2022-08-19 DIAGNOSIS — I4589 Other specified conduction disorders: Secondary | ICD-10-CM | POA: Diagnosis not present

## 2022-08-19 DIAGNOSIS — I342 Nonrheumatic mitral (valve) stenosis: Secondary | ICD-10-CM | POA: Diagnosis not present

## 2022-08-19 DIAGNOSIS — I453 Trifascicular block: Secondary | ICD-10-CM | POA: Diagnosis not present

## 2022-08-19 DIAGNOSIS — R54 Age-related physical debility: Secondary | ICD-10-CM | POA: Diagnosis not present

## 2022-08-19 DIAGNOSIS — I5033 Acute on chronic diastolic (congestive) heart failure: Secondary | ICD-10-CM | POA: Diagnosis not present

## 2022-08-19 DIAGNOSIS — I442 Atrioventricular block, complete: Secondary | ICD-10-CM | POA: Diagnosis not present

## 2022-08-19 DIAGNOSIS — R001 Bradycardia, unspecified: Secondary | ICD-10-CM | POA: Diagnosis not present

## 2022-08-19 DIAGNOSIS — R5383 Other fatigue: Secondary | ICD-10-CM | POA: Diagnosis not present

## 2022-08-20 DIAGNOSIS — I5033 Acute on chronic diastolic (congestive) heart failure: Secondary | ICD-10-CM | POA: Diagnosis not present

## 2022-08-20 DIAGNOSIS — J811 Chronic pulmonary edema: Secondary | ICD-10-CM | POA: Diagnosis not present

## 2022-08-20 DIAGNOSIS — I2510 Atherosclerotic heart disease of native coronary artery without angina pectoris: Secondary | ICD-10-CM | POA: Diagnosis not present

## 2022-08-20 DIAGNOSIS — Z952 Presence of prosthetic heart valve: Secondary | ICD-10-CM | POA: Diagnosis not present

## 2022-08-20 DIAGNOSIS — I442 Atrioventricular block, complete: Secondary | ICD-10-CM | POA: Diagnosis not present

## 2022-08-20 DIAGNOSIS — R0602 Shortness of breath: Secondary | ICD-10-CM | POA: Diagnosis not present

## 2022-08-20 DIAGNOSIS — I342 Nonrheumatic mitral (valve) stenosis: Secondary | ICD-10-CM | POA: Diagnosis not present

## 2022-08-21 DIAGNOSIS — R5381 Other malaise: Secondary | ICD-10-CM | POA: Diagnosis not present

## 2022-08-21 DIAGNOSIS — I872 Venous insufficiency (chronic) (peripheral): Secondary | ICD-10-CM | POA: Diagnosis not present

## 2022-08-21 DIAGNOSIS — Z950 Presence of cardiac pacemaker: Secondary | ICD-10-CM | POA: Diagnosis not present

## 2022-08-21 DIAGNOSIS — E559 Vitamin D deficiency, unspecified: Secondary | ICD-10-CM | POA: Diagnosis not present

## 2022-08-21 DIAGNOSIS — R682 Dry mouth, unspecified: Secondary | ICD-10-CM | POA: Diagnosis not present

## 2022-08-21 DIAGNOSIS — T8189XD Other complications of procedures, not elsewhere classified, subsequent encounter: Secondary | ICD-10-CM | POA: Diagnosis not present

## 2022-08-21 DIAGNOSIS — I110 Hypertensive heart disease with heart failure: Secondary | ICD-10-CM | POA: Diagnosis not present

## 2022-08-21 DIAGNOSIS — R0602 Shortness of breath: Secondary | ICD-10-CM | POA: Diagnosis not present

## 2022-08-21 DIAGNOSIS — Z952 Presence of prosthetic heart valve: Secondary | ICD-10-CM | POA: Diagnosis not present

## 2022-08-21 DIAGNOSIS — R279 Unspecified lack of coordination: Secondary | ICD-10-CM | POA: Diagnosis not present

## 2022-08-21 DIAGNOSIS — I213 ST elevation (STEMI) myocardial infarction of unspecified site: Secondary | ICD-10-CM | POA: Diagnosis not present

## 2022-08-21 DIAGNOSIS — E7800 Pure hypercholesterolemia, unspecified: Secondary | ICD-10-CM | POA: Diagnosis not present

## 2022-08-21 DIAGNOSIS — E785 Hyperlipidemia, unspecified: Secondary | ICD-10-CM | POA: Diagnosis not present

## 2022-08-21 DIAGNOSIS — I453 Trifascicular block: Secondary | ICD-10-CM | POA: Diagnosis not present

## 2022-08-21 DIAGNOSIS — T8130XD Disruption of wound, unspecified, subsequent encounter: Secondary | ICD-10-CM | POA: Diagnosis not present

## 2022-08-21 DIAGNOSIS — D649 Anemia, unspecified: Secondary | ICD-10-CM | POA: Diagnosis not present

## 2022-08-21 DIAGNOSIS — M810 Age-related osteoporosis without current pathological fracture: Secondary | ICD-10-CM | POA: Diagnosis not present

## 2022-08-21 DIAGNOSIS — I2510 Atherosclerotic heart disease of native coronary artery without angina pectoris: Secondary | ICD-10-CM | POA: Diagnosis not present

## 2022-08-21 DIAGNOSIS — I442 Atrioventricular block, complete: Secondary | ICD-10-CM | POA: Diagnosis not present

## 2022-08-21 DIAGNOSIS — N179 Acute kidney failure, unspecified: Secondary | ICD-10-CM | POA: Diagnosis not present

## 2022-08-21 DIAGNOSIS — I5033 Acute on chronic diastolic (congestive) heart failure: Secondary | ICD-10-CM | POA: Diagnosis not present

## 2022-08-21 DIAGNOSIS — Z954 Presence of other heart-valve replacement: Secondary | ICD-10-CM | POA: Diagnosis not present

## 2022-08-21 DIAGNOSIS — E538 Deficiency of other specified B group vitamins: Secondary | ICD-10-CM | POA: Diagnosis not present

## 2022-08-21 DIAGNOSIS — I480 Paroxysmal atrial fibrillation: Secondary | ICD-10-CM | POA: Diagnosis not present

## 2022-08-21 DIAGNOSIS — Z48812 Encounter for surgical aftercare following surgery on the circulatory system: Secondary | ICD-10-CM | POA: Diagnosis not present

## 2022-08-21 DIAGNOSIS — M6281 Muscle weakness (generalized): Secondary | ICD-10-CM | POA: Diagnosis not present

## 2022-08-21 DIAGNOSIS — I10 Essential (primary) hypertension: Secondary | ICD-10-CM | POA: Diagnosis not present

## 2022-08-21 DIAGNOSIS — N1832 Chronic kidney disease, stage 3b: Secondary | ICD-10-CM | POA: Diagnosis not present

## 2022-08-21 DIAGNOSIS — I724 Aneurysm of artery of lower extremity: Secondary | ICD-10-CM | POA: Diagnosis not present

## 2022-08-21 DIAGNOSIS — G2581 Restless legs syndrome: Secondary | ICD-10-CM | POA: Diagnosis not present

## 2022-08-21 DIAGNOSIS — Z7982 Long term (current) use of aspirin: Secondary | ICD-10-CM | POA: Diagnosis not present

## 2022-08-21 DIAGNOSIS — E440 Moderate protein-calorie malnutrition: Secondary | ICD-10-CM | POA: Diagnosis not present

## 2022-08-21 DIAGNOSIS — I509 Heart failure, unspecified: Secondary | ICD-10-CM | POA: Diagnosis not present

## 2022-08-21 DIAGNOSIS — Z955 Presence of coronary angioplasty implant and graft: Secondary | ICD-10-CM | POA: Diagnosis not present

## 2022-08-21 DIAGNOSIS — N182 Chronic kidney disease, stage 2 (mild): Secondary | ICD-10-CM | POA: Diagnosis not present

## 2022-08-21 DIAGNOSIS — K219 Gastro-esophageal reflux disease without esophagitis: Secondary | ICD-10-CM | POA: Diagnosis not present

## 2022-08-21 DIAGNOSIS — I130 Hypertensive heart and chronic kidney disease with heart failure and stage 1 through stage 4 chronic kidney disease, or unspecified chronic kidney disease: Secondary | ICD-10-CM | POA: Diagnosis not present

## 2022-08-21 DIAGNOSIS — J9601 Acute respiratory failure with hypoxia: Secondary | ICD-10-CM | POA: Diagnosis not present

## 2022-08-21 DIAGNOSIS — N184 Chronic kidney disease, stage 4 (severe): Secondary | ICD-10-CM | POA: Diagnosis not present

## 2022-08-21 DIAGNOSIS — I342 Nonrheumatic mitral (valve) stenosis: Secondary | ICD-10-CM | POA: Diagnosis not present

## 2022-08-21 DIAGNOSIS — I2102 ST elevation (STEMI) myocardial infarction involving left anterior descending coronary artery: Secondary | ICD-10-CM | POA: Diagnosis not present

## 2022-08-21 DIAGNOSIS — I4891 Unspecified atrial fibrillation: Secondary | ICD-10-CM | POA: Diagnosis not present

## 2022-08-21 DIAGNOSIS — J439 Emphysema, unspecified: Secondary | ICD-10-CM | POA: Diagnosis not present

## 2022-08-24 DIAGNOSIS — I480 Paroxysmal atrial fibrillation: Secondary | ICD-10-CM | POA: Diagnosis not present

## 2022-08-24 DIAGNOSIS — E559 Vitamin D deficiency, unspecified: Secondary | ICD-10-CM | POA: Diagnosis not present

## 2022-08-24 DIAGNOSIS — E440 Moderate protein-calorie malnutrition: Secondary | ICD-10-CM | POA: Diagnosis not present

## 2022-08-24 DIAGNOSIS — J9601 Acute respiratory failure with hypoxia: Secondary | ICD-10-CM | POA: Diagnosis not present

## 2022-08-24 DIAGNOSIS — N179 Acute kidney failure, unspecified: Secondary | ICD-10-CM | POA: Diagnosis not present

## 2022-08-24 DIAGNOSIS — I213 ST elevation (STEMI) myocardial infarction of unspecified site: Secondary | ICD-10-CM | POA: Diagnosis not present

## 2022-08-24 DIAGNOSIS — G2581 Restless legs syndrome: Secondary | ICD-10-CM | POA: Diagnosis not present

## 2022-08-24 DIAGNOSIS — N182 Chronic kidney disease, stage 2 (mild): Secondary | ICD-10-CM | POA: Diagnosis not present

## 2022-08-24 DIAGNOSIS — E785 Hyperlipidemia, unspecified: Secondary | ICD-10-CM | POA: Diagnosis not present

## 2022-08-24 DIAGNOSIS — I453 Trifascicular block: Secondary | ICD-10-CM | POA: Diagnosis not present

## 2022-08-24 DIAGNOSIS — I110 Hypertensive heart disease with heart failure: Secondary | ICD-10-CM | POA: Diagnosis not present

## 2022-08-25 DIAGNOSIS — I213 ST elevation (STEMI) myocardial infarction of unspecified site: Secondary | ICD-10-CM | POA: Diagnosis not present

## 2022-08-25 DIAGNOSIS — I453 Trifascicular block: Secondary | ICD-10-CM | POA: Diagnosis not present

## 2022-08-25 DIAGNOSIS — E785 Hyperlipidemia, unspecified: Secondary | ICD-10-CM | POA: Diagnosis not present

## 2022-08-25 DIAGNOSIS — N182 Chronic kidney disease, stage 2 (mild): Secondary | ICD-10-CM | POA: Diagnosis not present

## 2022-08-25 DIAGNOSIS — Z950 Presence of cardiac pacemaker: Secondary | ICD-10-CM | POA: Diagnosis not present

## 2022-08-25 DIAGNOSIS — I480 Paroxysmal atrial fibrillation: Secondary | ICD-10-CM | POA: Diagnosis not present

## 2022-08-25 DIAGNOSIS — E440 Moderate protein-calorie malnutrition: Secondary | ICD-10-CM | POA: Diagnosis not present

## 2022-08-25 DIAGNOSIS — N179 Acute kidney failure, unspecified: Secondary | ICD-10-CM | POA: Diagnosis not present

## 2022-08-25 DIAGNOSIS — J9601 Acute respiratory failure with hypoxia: Secondary | ICD-10-CM | POA: Diagnosis not present

## 2022-08-25 DIAGNOSIS — I509 Heart failure, unspecified: Secondary | ICD-10-CM | POA: Diagnosis not present

## 2022-08-25 DIAGNOSIS — I110 Hypertensive heart disease with heart failure: Secondary | ICD-10-CM | POA: Diagnosis not present

## 2022-08-28 DIAGNOSIS — Z950 Presence of cardiac pacemaker: Secondary | ICD-10-CM | POA: Diagnosis not present

## 2022-08-28 DIAGNOSIS — Z954 Presence of other heart-valve replacement: Secondary | ICD-10-CM | POA: Diagnosis not present

## 2022-08-28 DIAGNOSIS — R682 Dry mouth, unspecified: Secondary | ICD-10-CM | POA: Diagnosis not present

## 2022-08-28 DIAGNOSIS — I453 Trifascicular block: Secondary | ICD-10-CM | POA: Diagnosis not present

## 2022-08-28 DIAGNOSIS — Z48812 Encounter for surgical aftercare following surgery on the circulatory system: Secondary | ICD-10-CM | POA: Diagnosis not present

## 2022-08-28 DIAGNOSIS — Z952 Presence of prosthetic heart valve: Secondary | ICD-10-CM | POA: Diagnosis not present

## 2022-08-28 DIAGNOSIS — N184 Chronic kidney disease, stage 4 (severe): Secondary | ICD-10-CM | POA: Diagnosis not present

## 2022-08-28 DIAGNOSIS — I724 Aneurysm of artery of lower extremity: Secondary | ICD-10-CM | POA: Diagnosis not present

## 2022-08-28 DIAGNOSIS — E785 Hyperlipidemia, unspecified: Secondary | ICD-10-CM | POA: Diagnosis not present

## 2022-08-28 DIAGNOSIS — T8130XD Disruption of wound, unspecified, subsequent encounter: Secondary | ICD-10-CM | POA: Diagnosis not present

## 2022-08-28 DIAGNOSIS — I213 ST elevation (STEMI) myocardial infarction of unspecified site: Secondary | ICD-10-CM | POA: Diagnosis not present

## 2022-08-28 DIAGNOSIS — N179 Acute kidney failure, unspecified: Secondary | ICD-10-CM | POA: Diagnosis not present

## 2022-08-28 DIAGNOSIS — I110 Hypertensive heart disease with heart failure: Secondary | ICD-10-CM | POA: Diagnosis not present

## 2022-08-28 DIAGNOSIS — I480 Paroxysmal atrial fibrillation: Secondary | ICD-10-CM | POA: Diagnosis not present

## 2022-08-28 DIAGNOSIS — I509 Heart failure, unspecified: Secondary | ICD-10-CM | POA: Diagnosis not present

## 2022-08-31 DIAGNOSIS — T8189XD Other complications of procedures, not elsewhere classified, subsequent encounter: Secondary | ICD-10-CM | POA: Diagnosis not present

## 2022-08-31 DIAGNOSIS — I509 Heart failure, unspecified: Secondary | ICD-10-CM | POA: Diagnosis not present

## 2022-08-31 DIAGNOSIS — R682 Dry mouth, unspecified: Secondary | ICD-10-CM | POA: Diagnosis not present

## 2022-08-31 DIAGNOSIS — I480 Paroxysmal atrial fibrillation: Secondary | ICD-10-CM | POA: Diagnosis not present

## 2022-08-31 DIAGNOSIS — N179 Acute kidney failure, unspecified: Secondary | ICD-10-CM | POA: Diagnosis not present

## 2022-08-31 DIAGNOSIS — I213 ST elevation (STEMI) myocardial infarction of unspecified site: Secondary | ICD-10-CM | POA: Diagnosis not present

## 2022-08-31 DIAGNOSIS — G2581 Restless legs syndrome: Secondary | ICD-10-CM | POA: Diagnosis not present

## 2022-08-31 DIAGNOSIS — K219 Gastro-esophageal reflux disease without esophagitis: Secondary | ICD-10-CM | POA: Diagnosis not present

## 2022-08-31 DIAGNOSIS — Z950 Presence of cardiac pacemaker: Secondary | ICD-10-CM | POA: Diagnosis not present

## 2022-08-31 DIAGNOSIS — I110 Hypertensive heart disease with heart failure: Secondary | ICD-10-CM | POA: Diagnosis not present

## 2022-08-31 DIAGNOSIS — N184 Chronic kidney disease, stage 4 (severe): Secondary | ICD-10-CM | POA: Diagnosis not present

## 2022-09-02 DIAGNOSIS — I480 Paroxysmal atrial fibrillation: Secondary | ICD-10-CM | POA: Diagnosis not present

## 2022-09-02 DIAGNOSIS — R682 Dry mouth, unspecified: Secondary | ICD-10-CM | POA: Diagnosis not present

## 2022-09-02 DIAGNOSIS — N184 Chronic kidney disease, stage 4 (severe): Secondary | ICD-10-CM | POA: Diagnosis not present

## 2022-09-02 DIAGNOSIS — I509 Heart failure, unspecified: Secondary | ICD-10-CM | POA: Diagnosis not present

## 2022-09-02 DIAGNOSIS — I213 ST elevation (STEMI) myocardial infarction of unspecified site: Secondary | ICD-10-CM | POA: Diagnosis not present

## 2022-09-02 DIAGNOSIS — Z950 Presence of cardiac pacemaker: Secondary | ICD-10-CM | POA: Diagnosis not present

## 2022-09-02 DIAGNOSIS — I110 Hypertensive heart disease with heart failure: Secondary | ICD-10-CM | POA: Diagnosis not present

## 2022-09-02 DIAGNOSIS — E440 Moderate protein-calorie malnutrition: Secondary | ICD-10-CM | POA: Diagnosis not present

## 2022-09-02 DIAGNOSIS — T8189XD Other complications of procedures, not elsewhere classified, subsequent encounter: Secondary | ICD-10-CM | POA: Diagnosis not present

## 2022-09-04 DIAGNOSIS — I480 Paroxysmal atrial fibrillation: Secondary | ICD-10-CM | POA: Diagnosis not present

## 2022-09-04 DIAGNOSIS — I110 Hypertensive heart disease with heart failure: Secondary | ICD-10-CM | POA: Diagnosis not present

## 2022-09-04 DIAGNOSIS — Z950 Presence of cardiac pacemaker: Secondary | ICD-10-CM | POA: Diagnosis not present

## 2022-09-04 DIAGNOSIS — I509 Heart failure, unspecified: Secondary | ICD-10-CM | POA: Diagnosis not present

## 2022-09-04 DIAGNOSIS — Z954 Presence of other heart-valve replacement: Secondary | ICD-10-CM | POA: Diagnosis not present

## 2022-09-04 DIAGNOSIS — N184 Chronic kidney disease, stage 4 (severe): Secondary | ICD-10-CM | POA: Diagnosis not present

## 2022-09-04 DIAGNOSIS — T8189XD Other complications of procedures, not elsewhere classified, subsequent encounter: Secondary | ICD-10-CM | POA: Diagnosis not present

## 2022-09-04 DIAGNOSIS — I10 Essential (primary) hypertension: Secondary | ICD-10-CM | POA: Diagnosis not present

## 2022-09-04 DIAGNOSIS — I872 Venous insufficiency (chronic) (peripheral): Secondary | ICD-10-CM | POA: Diagnosis not present

## 2022-09-04 DIAGNOSIS — K219 Gastro-esophageal reflux disease without esophagitis: Secondary | ICD-10-CM | POA: Diagnosis not present

## 2022-09-04 DIAGNOSIS — G2581 Restless legs syndrome: Secondary | ICD-10-CM | POA: Diagnosis not present

## 2022-09-04 DIAGNOSIS — I213 ST elevation (STEMI) myocardial infarction of unspecified site: Secondary | ICD-10-CM | POA: Diagnosis not present

## 2022-09-07 DIAGNOSIS — T8189XD Other complications of procedures, not elsewhere classified, subsequent encounter: Secondary | ICD-10-CM | POA: Diagnosis not present

## 2022-09-07 DIAGNOSIS — K219 Gastro-esophageal reflux disease without esophagitis: Secondary | ICD-10-CM | POA: Diagnosis not present

## 2022-09-07 DIAGNOSIS — Z954 Presence of other heart-valve replacement: Secondary | ICD-10-CM | POA: Diagnosis not present

## 2022-09-07 DIAGNOSIS — G2581 Restless legs syndrome: Secondary | ICD-10-CM | POA: Diagnosis not present

## 2022-09-07 DIAGNOSIS — I509 Heart failure, unspecified: Secondary | ICD-10-CM | POA: Diagnosis not present

## 2022-09-07 DIAGNOSIS — N184 Chronic kidney disease, stage 4 (severe): Secondary | ICD-10-CM | POA: Diagnosis not present

## 2022-09-07 DIAGNOSIS — I480 Paroxysmal atrial fibrillation: Secondary | ICD-10-CM | POA: Diagnosis not present

## 2022-09-07 DIAGNOSIS — I110 Hypertensive heart disease with heart failure: Secondary | ICD-10-CM | POA: Diagnosis not present

## 2022-09-07 DIAGNOSIS — Z950 Presence of cardiac pacemaker: Secondary | ICD-10-CM | POA: Diagnosis not present

## 2022-09-07 DIAGNOSIS — I872 Venous insufficiency (chronic) (peripheral): Secondary | ICD-10-CM | POA: Diagnosis not present

## 2022-09-07 DIAGNOSIS — R682 Dry mouth, unspecified: Secondary | ICD-10-CM | POA: Diagnosis not present

## 2022-09-07 DIAGNOSIS — I213 ST elevation (STEMI) myocardial infarction of unspecified site: Secondary | ICD-10-CM | POA: Diagnosis not present

## 2022-09-11 DIAGNOSIS — E440 Moderate protein-calorie malnutrition: Secondary | ICD-10-CM | POA: Diagnosis not present

## 2022-09-11 DIAGNOSIS — D631 Anemia in chronic kidney disease: Secondary | ICD-10-CM | POA: Diagnosis not present

## 2022-09-11 DIAGNOSIS — K219 Gastro-esophageal reflux disease without esophagitis: Secondary | ICD-10-CM | POA: Diagnosis not present

## 2022-09-11 DIAGNOSIS — Z950 Presence of cardiac pacemaker: Secondary | ICD-10-CM | POA: Diagnosis not present

## 2022-09-11 DIAGNOSIS — E538 Deficiency of other specified B group vitamins: Secondary | ICD-10-CM | POA: Diagnosis not present

## 2022-09-11 DIAGNOSIS — G2581 Restless legs syndrome: Secondary | ICD-10-CM | POA: Diagnosis not present

## 2022-09-11 DIAGNOSIS — Z7984 Long term (current) use of oral hypoglycemic drugs: Secondary | ICD-10-CM | POA: Diagnosis not present

## 2022-09-11 DIAGNOSIS — I130 Hypertensive heart and chronic kidney disease with heart failure and stage 1 through stage 4 chronic kidney disease, or unspecified chronic kidney disease: Secondary | ICD-10-CM | POA: Diagnosis not present

## 2022-09-11 DIAGNOSIS — M316 Other giant cell arteritis: Secondary | ICD-10-CM | POA: Diagnosis not present

## 2022-09-11 DIAGNOSIS — E7800 Pure hypercholesterolemia, unspecified: Secondary | ICD-10-CM | POA: Diagnosis not present

## 2022-09-11 DIAGNOSIS — I453 Trifascicular block: Secondary | ICD-10-CM | POA: Diagnosis not present

## 2022-09-11 DIAGNOSIS — N184 Chronic kidney disease, stage 4 (severe): Secondary | ICD-10-CM | POA: Diagnosis not present

## 2022-09-11 DIAGNOSIS — I872 Venous insufficiency (chronic) (peripheral): Secondary | ICD-10-CM | POA: Diagnosis not present

## 2022-09-11 DIAGNOSIS — I252 Old myocardial infarction: Secondary | ICD-10-CM | POA: Diagnosis not present

## 2022-09-11 DIAGNOSIS — I2510 Atherosclerotic heart disease of native coronary artery without angina pectoris: Secondary | ICD-10-CM | POA: Diagnosis not present

## 2022-09-11 DIAGNOSIS — Z955 Presence of coronary angioplasty implant and graft: Secondary | ICD-10-CM | POA: Diagnosis not present

## 2022-09-11 DIAGNOSIS — M6281 Muscle weakness (generalized): Secondary | ICD-10-CM | POA: Diagnosis not present

## 2022-09-11 DIAGNOSIS — Z952 Presence of prosthetic heart valve: Secondary | ICD-10-CM | POA: Diagnosis not present

## 2022-09-11 DIAGNOSIS — J9602 Acute respiratory failure with hypercapnia: Secondary | ICD-10-CM | POA: Diagnosis not present

## 2022-09-11 DIAGNOSIS — I739 Peripheral vascular disease, unspecified: Secondary | ICD-10-CM | POA: Diagnosis not present

## 2022-09-11 DIAGNOSIS — I5033 Acute on chronic diastolic (congestive) heart failure: Secondary | ICD-10-CM | POA: Diagnosis not present

## 2022-09-11 DIAGNOSIS — I480 Paroxysmal atrial fibrillation: Secondary | ICD-10-CM | POA: Diagnosis not present

## 2022-09-11 DIAGNOSIS — M810 Age-related osteoporosis without current pathological fracture: Secondary | ICD-10-CM | POA: Diagnosis not present

## 2022-09-11 DIAGNOSIS — T8131XA Disruption of external operation (surgical) wound, not elsewhere classified, initial encounter: Secondary | ICD-10-CM | POA: Diagnosis not present

## 2022-09-14 DIAGNOSIS — Z87891 Personal history of nicotine dependence: Secondary | ICD-10-CM | POA: Diagnosis not present

## 2022-09-14 DIAGNOSIS — Z48812 Encounter for surgical aftercare following surgery on the circulatory system: Secondary | ICD-10-CM | POA: Diagnosis not present

## 2022-09-14 DIAGNOSIS — Z7902 Long term (current) use of antithrombotics/antiplatelets: Secondary | ICD-10-CM | POA: Diagnosis not present

## 2022-09-14 DIAGNOSIS — Z952 Presence of prosthetic heart valve: Secondary | ICD-10-CM | POA: Diagnosis not present

## 2022-09-14 DIAGNOSIS — R9431 Abnormal electrocardiogram [ECG] [EKG]: Secondary | ICD-10-CM | POA: Diagnosis not present

## 2022-09-14 DIAGNOSIS — J90 Pleural effusion, not elsewhere classified: Secondary | ICD-10-CM | POA: Diagnosis not present

## 2022-09-14 DIAGNOSIS — Z7982 Long term (current) use of aspirin: Secondary | ICD-10-CM | POA: Diagnosis not present

## 2022-09-14 DIAGNOSIS — J811 Chronic pulmonary edema: Secondary | ICD-10-CM | POA: Diagnosis not present

## 2022-09-15 DIAGNOSIS — I214 Non-ST elevation (NSTEMI) myocardial infarction: Secondary | ICD-10-CM | POA: Diagnosis not present

## 2022-09-16 DIAGNOSIS — J9602 Acute respiratory failure with hypercapnia: Secondary | ICD-10-CM | POA: Diagnosis not present

## 2022-09-16 DIAGNOSIS — Z7984 Long term (current) use of oral hypoglycemic drugs: Secondary | ICD-10-CM | POA: Diagnosis not present

## 2022-09-16 DIAGNOSIS — K219 Gastro-esophageal reflux disease without esophagitis: Secondary | ICD-10-CM | POA: Diagnosis not present

## 2022-09-16 DIAGNOSIS — I480 Paroxysmal atrial fibrillation: Secondary | ICD-10-CM | POA: Diagnosis not present

## 2022-09-16 DIAGNOSIS — M6281 Muscle weakness (generalized): Secondary | ICD-10-CM | POA: Diagnosis not present

## 2022-09-16 DIAGNOSIS — G2581 Restless legs syndrome: Secondary | ICD-10-CM | POA: Diagnosis not present

## 2022-09-16 DIAGNOSIS — I252 Old myocardial infarction: Secondary | ICD-10-CM | POA: Diagnosis not present

## 2022-09-16 DIAGNOSIS — N184 Chronic kidney disease, stage 4 (severe): Secondary | ICD-10-CM | POA: Diagnosis not present

## 2022-09-16 DIAGNOSIS — E7800 Pure hypercholesterolemia, unspecified: Secondary | ICD-10-CM | POA: Diagnosis not present

## 2022-09-16 DIAGNOSIS — Z952 Presence of prosthetic heart valve: Secondary | ICD-10-CM | POA: Diagnosis not present

## 2022-09-16 DIAGNOSIS — E538 Deficiency of other specified B group vitamins: Secondary | ICD-10-CM | POA: Diagnosis not present

## 2022-09-16 DIAGNOSIS — D631 Anemia in chronic kidney disease: Secondary | ICD-10-CM | POA: Diagnosis not present

## 2022-09-16 DIAGNOSIS — Z950 Presence of cardiac pacemaker: Secondary | ICD-10-CM | POA: Diagnosis not present

## 2022-09-16 DIAGNOSIS — M810 Age-related osteoporosis without current pathological fracture: Secondary | ICD-10-CM | POA: Diagnosis not present

## 2022-09-16 DIAGNOSIS — I5033 Acute on chronic diastolic (congestive) heart failure: Secondary | ICD-10-CM | POA: Diagnosis not present

## 2022-09-16 DIAGNOSIS — Z955 Presence of coronary angioplasty implant and graft: Secondary | ICD-10-CM | POA: Diagnosis not present

## 2022-09-16 DIAGNOSIS — E440 Moderate protein-calorie malnutrition: Secondary | ICD-10-CM | POA: Diagnosis not present

## 2022-09-16 DIAGNOSIS — I453 Trifascicular block: Secondary | ICD-10-CM | POA: Diagnosis not present

## 2022-09-16 DIAGNOSIS — I739 Peripheral vascular disease, unspecified: Secondary | ICD-10-CM | POA: Diagnosis not present

## 2022-09-16 DIAGNOSIS — I872 Venous insufficiency (chronic) (peripheral): Secondary | ICD-10-CM | POA: Diagnosis not present

## 2022-09-16 DIAGNOSIS — I130 Hypertensive heart and chronic kidney disease with heart failure and stage 1 through stage 4 chronic kidney disease, or unspecified chronic kidney disease: Secondary | ICD-10-CM | POA: Diagnosis not present

## 2022-09-16 DIAGNOSIS — T8131XA Disruption of external operation (surgical) wound, not elsewhere classified, initial encounter: Secondary | ICD-10-CM | POA: Diagnosis not present

## 2022-09-16 DIAGNOSIS — M316 Other giant cell arteritis: Secondary | ICD-10-CM | POA: Diagnosis not present

## 2022-09-16 DIAGNOSIS — I2510 Atherosclerotic heart disease of native coronary artery without angina pectoris: Secondary | ICD-10-CM | POA: Diagnosis not present

## 2022-09-17 DIAGNOSIS — N184 Chronic kidney disease, stage 4 (severe): Secondary | ICD-10-CM | POA: Diagnosis not present

## 2022-09-17 DIAGNOSIS — M810 Age-related osteoporosis without current pathological fracture: Secondary | ICD-10-CM | POA: Diagnosis not present

## 2022-09-17 DIAGNOSIS — I453 Trifascicular block: Secondary | ICD-10-CM | POA: Diagnosis not present

## 2022-09-17 DIAGNOSIS — E440 Moderate protein-calorie malnutrition: Secondary | ICD-10-CM | POA: Diagnosis not present

## 2022-09-17 DIAGNOSIS — I480 Paroxysmal atrial fibrillation: Secondary | ICD-10-CM | POA: Diagnosis not present

## 2022-09-17 DIAGNOSIS — T8131XA Disruption of external operation (surgical) wound, not elsewhere classified, initial encounter: Secondary | ICD-10-CM | POA: Diagnosis not present

## 2022-09-17 DIAGNOSIS — I5033 Acute on chronic diastolic (congestive) heart failure: Secondary | ICD-10-CM | POA: Diagnosis not present

## 2022-09-17 DIAGNOSIS — Z950 Presence of cardiac pacemaker: Secondary | ICD-10-CM | POA: Diagnosis not present

## 2022-09-17 DIAGNOSIS — I2510 Atherosclerotic heart disease of native coronary artery without angina pectoris: Secondary | ICD-10-CM | POA: Diagnosis not present

## 2022-09-17 DIAGNOSIS — E538 Deficiency of other specified B group vitamins: Secondary | ICD-10-CM | POA: Diagnosis not present

## 2022-09-17 DIAGNOSIS — I130 Hypertensive heart and chronic kidney disease with heart failure and stage 1 through stage 4 chronic kidney disease, or unspecified chronic kidney disease: Secondary | ICD-10-CM | POA: Diagnosis not present

## 2022-09-17 DIAGNOSIS — I252 Old myocardial infarction: Secondary | ICD-10-CM | POA: Diagnosis not present

## 2022-09-17 DIAGNOSIS — G2581 Restless legs syndrome: Secondary | ICD-10-CM | POA: Diagnosis not present

## 2022-09-17 DIAGNOSIS — E7800 Pure hypercholesterolemia, unspecified: Secondary | ICD-10-CM | POA: Diagnosis not present

## 2022-09-17 DIAGNOSIS — I739 Peripheral vascular disease, unspecified: Secondary | ICD-10-CM | POA: Diagnosis not present

## 2022-09-17 DIAGNOSIS — Z952 Presence of prosthetic heart valve: Secondary | ICD-10-CM | POA: Diagnosis not present

## 2022-09-17 DIAGNOSIS — D631 Anemia in chronic kidney disease: Secondary | ICD-10-CM | POA: Diagnosis not present

## 2022-09-17 DIAGNOSIS — M6281 Muscle weakness (generalized): Secondary | ICD-10-CM | POA: Diagnosis not present

## 2022-09-17 DIAGNOSIS — I872 Venous insufficiency (chronic) (peripheral): Secondary | ICD-10-CM | POA: Diagnosis not present

## 2022-09-17 DIAGNOSIS — Z955 Presence of coronary angioplasty implant and graft: Secondary | ICD-10-CM | POA: Diagnosis not present

## 2022-09-17 DIAGNOSIS — M316 Other giant cell arteritis: Secondary | ICD-10-CM | POA: Diagnosis not present

## 2022-09-17 DIAGNOSIS — Z7984 Long term (current) use of oral hypoglycemic drugs: Secondary | ICD-10-CM | POA: Diagnosis not present

## 2022-09-17 DIAGNOSIS — J9602 Acute respiratory failure with hypercapnia: Secondary | ICD-10-CM | POA: Diagnosis not present

## 2022-09-17 DIAGNOSIS — K219 Gastro-esophageal reflux disease without esophagitis: Secondary | ICD-10-CM | POA: Diagnosis not present

## 2022-09-18 DIAGNOSIS — Z952 Presence of prosthetic heart valve: Secondary | ICD-10-CM | POA: Diagnosis not present

## 2022-09-18 DIAGNOSIS — D631 Anemia in chronic kidney disease: Secondary | ICD-10-CM | POA: Diagnosis not present

## 2022-09-18 DIAGNOSIS — E7800 Pure hypercholesterolemia, unspecified: Secondary | ICD-10-CM | POA: Diagnosis not present

## 2022-09-18 DIAGNOSIS — K219 Gastro-esophageal reflux disease without esophagitis: Secondary | ICD-10-CM | POA: Diagnosis not present

## 2022-09-18 DIAGNOSIS — T8131XA Disruption of external operation (surgical) wound, not elsewhere classified, initial encounter: Secondary | ICD-10-CM | POA: Diagnosis not present

## 2022-09-18 DIAGNOSIS — E440 Moderate protein-calorie malnutrition: Secondary | ICD-10-CM | POA: Diagnosis not present

## 2022-09-18 DIAGNOSIS — N184 Chronic kidney disease, stage 4 (severe): Secondary | ICD-10-CM | POA: Diagnosis not present

## 2022-09-18 DIAGNOSIS — I453 Trifascicular block: Secondary | ICD-10-CM | POA: Diagnosis not present

## 2022-09-18 DIAGNOSIS — Z7984 Long term (current) use of oral hypoglycemic drugs: Secondary | ICD-10-CM | POA: Diagnosis not present

## 2022-09-18 DIAGNOSIS — Z955 Presence of coronary angioplasty implant and graft: Secondary | ICD-10-CM | POA: Diagnosis not present

## 2022-09-18 DIAGNOSIS — I5033 Acute on chronic diastolic (congestive) heart failure: Secondary | ICD-10-CM | POA: Diagnosis not present

## 2022-09-18 DIAGNOSIS — M6281 Muscle weakness (generalized): Secondary | ICD-10-CM | POA: Diagnosis not present

## 2022-09-18 DIAGNOSIS — I739 Peripheral vascular disease, unspecified: Secondary | ICD-10-CM | POA: Diagnosis not present

## 2022-09-18 DIAGNOSIS — I2510 Atherosclerotic heart disease of native coronary artery without angina pectoris: Secondary | ICD-10-CM | POA: Diagnosis not present

## 2022-09-18 DIAGNOSIS — I130 Hypertensive heart and chronic kidney disease with heart failure and stage 1 through stage 4 chronic kidney disease, or unspecified chronic kidney disease: Secondary | ICD-10-CM | POA: Diagnosis not present

## 2022-09-18 DIAGNOSIS — M316 Other giant cell arteritis: Secondary | ICD-10-CM | POA: Diagnosis not present

## 2022-09-18 DIAGNOSIS — J9602 Acute respiratory failure with hypercapnia: Secondary | ICD-10-CM | POA: Diagnosis not present

## 2022-09-18 DIAGNOSIS — M810 Age-related osteoporosis without current pathological fracture: Secondary | ICD-10-CM | POA: Diagnosis not present

## 2022-09-18 DIAGNOSIS — I480 Paroxysmal atrial fibrillation: Secondary | ICD-10-CM | POA: Diagnosis not present

## 2022-09-18 DIAGNOSIS — E538 Deficiency of other specified B group vitamins: Secondary | ICD-10-CM | POA: Diagnosis not present

## 2022-09-18 DIAGNOSIS — G2581 Restless legs syndrome: Secondary | ICD-10-CM | POA: Diagnosis not present

## 2022-09-18 DIAGNOSIS — I252 Old myocardial infarction: Secondary | ICD-10-CM | POA: Diagnosis not present

## 2022-09-18 DIAGNOSIS — Z950 Presence of cardiac pacemaker: Secondary | ICD-10-CM | POA: Diagnosis not present

## 2022-09-18 DIAGNOSIS — I872 Venous insufficiency (chronic) (peripheral): Secondary | ICD-10-CM | POA: Diagnosis not present

## 2022-09-22 ENCOUNTER — Other Ambulatory Visit (INDEPENDENT_AMBULATORY_CARE_PROVIDER_SITE_OTHER): Payer: Self-pay | Admitting: Nurse Practitioner

## 2022-09-22 DIAGNOSIS — I5033 Acute on chronic diastolic (congestive) heart failure: Secondary | ICD-10-CM | POA: Diagnosis not present

## 2022-09-22 DIAGNOSIS — I453 Trifascicular block: Secondary | ICD-10-CM | POA: Diagnosis not present

## 2022-09-22 DIAGNOSIS — I10 Essential (primary) hypertension: Secondary | ICD-10-CM | POA: Diagnosis not present

## 2022-09-22 DIAGNOSIS — Z950 Presence of cardiac pacemaker: Secondary | ICD-10-CM | POA: Diagnosis not present

## 2022-09-22 DIAGNOSIS — I4891 Unspecified atrial fibrillation: Secondary | ICD-10-CM | POA: Diagnosis not present

## 2022-09-22 DIAGNOSIS — R001 Bradycardia, unspecified: Secondary | ICD-10-CM | POA: Diagnosis not present

## 2022-09-22 DIAGNOSIS — R0689 Other abnormalities of breathing: Secondary | ICD-10-CM | POA: Diagnosis not present

## 2022-09-22 DIAGNOSIS — I4892 Unspecified atrial flutter: Secondary | ICD-10-CM | POA: Diagnosis not present

## 2022-09-22 DIAGNOSIS — Z952 Presence of prosthetic heart valve: Secondary | ICD-10-CM | POA: Diagnosis not present

## 2022-09-22 DIAGNOSIS — R0602 Shortness of breath: Secondary | ICD-10-CM | POA: Diagnosis not present

## 2022-09-22 DIAGNOSIS — Z955 Presence of coronary angioplasty implant and graft: Secondary | ICD-10-CM | POA: Diagnosis not present

## 2022-09-22 DIAGNOSIS — I2510 Atherosclerotic heart disease of native coronary artery without angina pectoris: Secondary | ICD-10-CM | POA: Diagnosis not present

## 2022-09-23 DIAGNOSIS — I5033 Acute on chronic diastolic (congestive) heart failure: Secondary | ICD-10-CM | POA: Diagnosis not present

## 2022-09-23 DIAGNOSIS — I130 Hypertensive heart and chronic kidney disease with heart failure and stage 1 through stage 4 chronic kidney disease, or unspecified chronic kidney disease: Secondary | ICD-10-CM | POA: Diagnosis not present

## 2022-09-23 DIAGNOSIS — Z952 Presence of prosthetic heart valve: Secondary | ICD-10-CM | POA: Diagnosis not present

## 2022-09-23 DIAGNOSIS — Z7984 Long term (current) use of oral hypoglycemic drugs: Secondary | ICD-10-CM | POA: Diagnosis not present

## 2022-09-23 DIAGNOSIS — I453 Trifascicular block: Secondary | ICD-10-CM | POA: Diagnosis not present

## 2022-09-23 DIAGNOSIS — Z955 Presence of coronary angioplasty implant and graft: Secondary | ICD-10-CM | POA: Diagnosis not present

## 2022-09-23 DIAGNOSIS — Z950 Presence of cardiac pacemaker: Secondary | ICD-10-CM | POA: Diagnosis not present

## 2022-09-23 DIAGNOSIS — I480 Paroxysmal atrial fibrillation: Secondary | ICD-10-CM | POA: Diagnosis not present

## 2022-09-23 DIAGNOSIS — E538 Deficiency of other specified B group vitamins: Secondary | ICD-10-CM | POA: Diagnosis not present

## 2022-09-23 DIAGNOSIS — G2581 Restless legs syndrome: Secondary | ICD-10-CM | POA: Diagnosis not present

## 2022-09-23 DIAGNOSIS — E440 Moderate protein-calorie malnutrition: Secondary | ICD-10-CM | POA: Diagnosis not present

## 2022-09-23 DIAGNOSIS — D631 Anemia in chronic kidney disease: Secondary | ICD-10-CM | POA: Diagnosis not present

## 2022-09-23 DIAGNOSIS — E7800 Pure hypercholesterolemia, unspecified: Secondary | ICD-10-CM | POA: Diagnosis not present

## 2022-09-23 DIAGNOSIS — M6281 Muscle weakness (generalized): Secondary | ICD-10-CM | POA: Diagnosis not present

## 2022-09-23 DIAGNOSIS — I872 Venous insufficiency (chronic) (peripheral): Secondary | ICD-10-CM | POA: Diagnosis not present

## 2022-09-23 DIAGNOSIS — I252 Old myocardial infarction: Secondary | ICD-10-CM | POA: Diagnosis not present

## 2022-09-23 DIAGNOSIS — M316 Other giant cell arteritis: Secondary | ICD-10-CM | POA: Diagnosis not present

## 2022-09-23 DIAGNOSIS — N184 Chronic kidney disease, stage 4 (severe): Secondary | ICD-10-CM | POA: Diagnosis not present

## 2022-09-23 DIAGNOSIS — J9602 Acute respiratory failure with hypercapnia: Secondary | ICD-10-CM | POA: Diagnosis not present

## 2022-09-23 DIAGNOSIS — I739 Peripheral vascular disease, unspecified: Secondary | ICD-10-CM | POA: Diagnosis not present

## 2022-09-23 DIAGNOSIS — T8131XA Disruption of external operation (surgical) wound, not elsewhere classified, initial encounter: Secondary | ICD-10-CM | POA: Diagnosis not present

## 2022-09-23 DIAGNOSIS — M810 Age-related osteoporosis without current pathological fracture: Secondary | ICD-10-CM | POA: Diagnosis not present

## 2022-09-23 DIAGNOSIS — I2510 Atherosclerotic heart disease of native coronary artery without angina pectoris: Secondary | ICD-10-CM | POA: Diagnosis not present

## 2022-09-23 DIAGNOSIS — K219 Gastro-esophageal reflux disease without esophagitis: Secondary | ICD-10-CM | POA: Diagnosis not present

## 2022-09-24 DIAGNOSIS — I2510 Atherosclerotic heart disease of native coronary artery without angina pectoris: Secondary | ICD-10-CM | POA: Diagnosis not present

## 2022-09-24 DIAGNOSIS — Z950 Presence of cardiac pacemaker: Secondary | ICD-10-CM | POA: Diagnosis not present

## 2022-09-24 DIAGNOSIS — M6281 Muscle weakness (generalized): Secondary | ICD-10-CM | POA: Diagnosis not present

## 2022-09-24 DIAGNOSIS — E440 Moderate protein-calorie malnutrition: Secondary | ICD-10-CM | POA: Diagnosis not present

## 2022-09-24 DIAGNOSIS — J9602 Acute respiratory failure with hypercapnia: Secondary | ICD-10-CM | POA: Diagnosis not present

## 2022-09-24 DIAGNOSIS — Z955 Presence of coronary angioplasty implant and graft: Secondary | ICD-10-CM | POA: Diagnosis not present

## 2022-09-24 DIAGNOSIS — G2581 Restless legs syndrome: Secondary | ICD-10-CM | POA: Diagnosis not present

## 2022-09-24 DIAGNOSIS — I130 Hypertensive heart and chronic kidney disease with heart failure and stage 1 through stage 4 chronic kidney disease, or unspecified chronic kidney disease: Secondary | ICD-10-CM | POA: Diagnosis not present

## 2022-09-24 DIAGNOSIS — D631 Anemia in chronic kidney disease: Secondary | ICD-10-CM | POA: Diagnosis not present

## 2022-09-24 DIAGNOSIS — I739 Peripheral vascular disease, unspecified: Secondary | ICD-10-CM | POA: Diagnosis not present

## 2022-09-24 DIAGNOSIS — I480 Paroxysmal atrial fibrillation: Secondary | ICD-10-CM | POA: Diagnosis not present

## 2022-09-24 DIAGNOSIS — E7800 Pure hypercholesterolemia, unspecified: Secondary | ICD-10-CM | POA: Diagnosis not present

## 2022-09-24 DIAGNOSIS — K219 Gastro-esophageal reflux disease without esophagitis: Secondary | ICD-10-CM | POA: Diagnosis not present

## 2022-09-24 DIAGNOSIS — Z952 Presence of prosthetic heart valve: Secondary | ICD-10-CM | POA: Diagnosis not present

## 2022-09-24 DIAGNOSIS — M810 Age-related osteoporosis without current pathological fracture: Secondary | ICD-10-CM | POA: Diagnosis not present

## 2022-09-24 DIAGNOSIS — I872 Venous insufficiency (chronic) (peripheral): Secondary | ICD-10-CM | POA: Diagnosis not present

## 2022-09-24 DIAGNOSIS — N184 Chronic kidney disease, stage 4 (severe): Secondary | ICD-10-CM | POA: Diagnosis not present

## 2022-09-24 DIAGNOSIS — M316 Other giant cell arteritis: Secondary | ICD-10-CM | POA: Diagnosis not present

## 2022-09-24 DIAGNOSIS — I453 Trifascicular block: Secondary | ICD-10-CM | POA: Diagnosis not present

## 2022-09-24 DIAGNOSIS — E538 Deficiency of other specified B group vitamins: Secondary | ICD-10-CM | POA: Diagnosis not present

## 2022-09-24 DIAGNOSIS — I252 Old myocardial infarction: Secondary | ICD-10-CM | POA: Diagnosis not present

## 2022-09-24 DIAGNOSIS — I5033 Acute on chronic diastolic (congestive) heart failure: Secondary | ICD-10-CM | POA: Diagnosis not present

## 2022-09-24 DIAGNOSIS — T8131XA Disruption of external operation (surgical) wound, not elsewhere classified, initial encounter: Secondary | ICD-10-CM | POA: Diagnosis not present

## 2022-09-24 DIAGNOSIS — Z7984 Long term (current) use of oral hypoglycemic drugs: Secondary | ICD-10-CM | POA: Diagnosis not present

## 2022-09-26 DIAGNOSIS — I453 Trifascicular block: Secondary | ICD-10-CM | POA: Diagnosis not present

## 2022-09-26 DIAGNOSIS — Z950 Presence of cardiac pacemaker: Secondary | ICD-10-CM | POA: Diagnosis not present

## 2022-09-26 DIAGNOSIS — Z952 Presence of prosthetic heart valve: Secondary | ICD-10-CM | POA: Diagnosis not present

## 2022-09-26 DIAGNOSIS — R0602 Shortness of breath: Secondary | ICD-10-CM | POA: Diagnosis not present

## 2022-09-26 DIAGNOSIS — R001 Bradycardia, unspecified: Secondary | ICD-10-CM | POA: Diagnosis not present

## 2022-09-26 DIAGNOSIS — I10 Essential (primary) hypertension: Secondary | ICD-10-CM | POA: Diagnosis not present

## 2022-09-26 DIAGNOSIS — I4891 Unspecified atrial fibrillation: Secondary | ICD-10-CM | POA: Diagnosis not present

## 2022-09-26 DIAGNOSIS — R0689 Other abnormalities of breathing: Secondary | ICD-10-CM | POA: Diagnosis not present

## 2022-09-26 DIAGNOSIS — I5033 Acute on chronic diastolic (congestive) heart failure: Secondary | ICD-10-CM | POA: Diagnosis not present

## 2022-09-26 DIAGNOSIS — I4892 Unspecified atrial flutter: Secondary | ICD-10-CM | POA: Diagnosis not present

## 2022-09-26 DIAGNOSIS — I2510 Atherosclerotic heart disease of native coronary artery without angina pectoris: Secondary | ICD-10-CM | POA: Diagnosis not present

## 2022-09-28 DIAGNOSIS — E538 Deficiency of other specified B group vitamins: Secondary | ICD-10-CM | POA: Diagnosis not present

## 2022-09-28 DIAGNOSIS — Z7984 Long term (current) use of oral hypoglycemic drugs: Secondary | ICD-10-CM | POA: Diagnosis not present

## 2022-09-28 DIAGNOSIS — E440 Moderate protein-calorie malnutrition: Secondary | ICD-10-CM | POA: Diagnosis not present

## 2022-09-28 DIAGNOSIS — I872 Venous insufficiency (chronic) (peripheral): Secondary | ICD-10-CM | POA: Diagnosis not present

## 2022-09-28 DIAGNOSIS — Z950 Presence of cardiac pacemaker: Secondary | ICD-10-CM | POA: Diagnosis not present

## 2022-09-28 DIAGNOSIS — J9602 Acute respiratory failure with hypercapnia: Secondary | ICD-10-CM | POA: Diagnosis not present

## 2022-09-28 DIAGNOSIS — M6281 Muscle weakness (generalized): Secondary | ICD-10-CM | POA: Diagnosis not present

## 2022-09-28 DIAGNOSIS — Z955 Presence of coronary angioplasty implant and graft: Secondary | ICD-10-CM | POA: Diagnosis not present

## 2022-09-28 DIAGNOSIS — I5033 Acute on chronic diastolic (congestive) heart failure: Secondary | ICD-10-CM | POA: Diagnosis not present

## 2022-09-28 DIAGNOSIS — G2581 Restless legs syndrome: Secondary | ICD-10-CM | POA: Diagnosis not present

## 2022-09-28 DIAGNOSIS — D631 Anemia in chronic kidney disease: Secondary | ICD-10-CM | POA: Diagnosis not present

## 2022-09-28 DIAGNOSIS — N184 Chronic kidney disease, stage 4 (severe): Secondary | ICD-10-CM | POA: Diagnosis not present

## 2022-09-28 DIAGNOSIS — I739 Peripheral vascular disease, unspecified: Secondary | ICD-10-CM | POA: Diagnosis not present

## 2022-09-28 DIAGNOSIS — K219 Gastro-esophageal reflux disease without esophagitis: Secondary | ICD-10-CM | POA: Diagnosis not present

## 2022-09-28 DIAGNOSIS — I453 Trifascicular block: Secondary | ICD-10-CM | POA: Diagnosis not present

## 2022-09-28 DIAGNOSIS — Z952 Presence of prosthetic heart valve: Secondary | ICD-10-CM | POA: Diagnosis not present

## 2022-09-28 DIAGNOSIS — M316 Other giant cell arteritis: Secondary | ICD-10-CM | POA: Diagnosis not present

## 2022-09-28 DIAGNOSIS — I480 Paroxysmal atrial fibrillation: Secondary | ICD-10-CM | POA: Diagnosis not present

## 2022-09-28 DIAGNOSIS — I130 Hypertensive heart and chronic kidney disease with heart failure and stage 1 through stage 4 chronic kidney disease, or unspecified chronic kidney disease: Secondary | ICD-10-CM | POA: Diagnosis not present

## 2022-09-28 DIAGNOSIS — I252 Old myocardial infarction: Secondary | ICD-10-CM | POA: Diagnosis not present

## 2022-09-28 DIAGNOSIS — I2510 Atherosclerotic heart disease of native coronary artery without angina pectoris: Secondary | ICD-10-CM | POA: Diagnosis not present

## 2022-09-28 DIAGNOSIS — T8131XA Disruption of external operation (surgical) wound, not elsewhere classified, initial encounter: Secondary | ICD-10-CM | POA: Diagnosis not present

## 2022-09-28 DIAGNOSIS — E7800 Pure hypercholesterolemia, unspecified: Secondary | ICD-10-CM | POA: Diagnosis not present

## 2022-09-28 DIAGNOSIS — M810 Age-related osteoporosis without current pathological fracture: Secondary | ICD-10-CM | POA: Diagnosis not present

## 2022-09-29 DIAGNOSIS — Z955 Presence of coronary angioplasty implant and graft: Secondary | ICD-10-CM | POA: Diagnosis not present

## 2022-09-29 DIAGNOSIS — D631 Anemia in chronic kidney disease: Secondary | ICD-10-CM | POA: Diagnosis not present

## 2022-09-29 DIAGNOSIS — Z952 Presence of prosthetic heart valve: Secondary | ICD-10-CM | POA: Diagnosis not present

## 2022-09-29 DIAGNOSIS — J9602 Acute respiratory failure with hypercapnia: Secondary | ICD-10-CM | POA: Diagnosis not present

## 2022-09-29 DIAGNOSIS — M6281 Muscle weakness (generalized): Secondary | ICD-10-CM | POA: Diagnosis not present

## 2022-09-29 DIAGNOSIS — I5033 Acute on chronic diastolic (congestive) heart failure: Secondary | ICD-10-CM | POA: Diagnosis not present

## 2022-09-29 DIAGNOSIS — M810 Age-related osteoporosis without current pathological fracture: Secondary | ICD-10-CM | POA: Diagnosis not present

## 2022-09-29 DIAGNOSIS — N184 Chronic kidney disease, stage 4 (severe): Secondary | ICD-10-CM | POA: Diagnosis not present

## 2022-09-29 DIAGNOSIS — I739 Peripheral vascular disease, unspecified: Secondary | ICD-10-CM | POA: Diagnosis not present

## 2022-09-29 DIAGNOSIS — Z7984 Long term (current) use of oral hypoglycemic drugs: Secondary | ICD-10-CM | POA: Diagnosis not present

## 2022-09-29 DIAGNOSIS — E440 Moderate protein-calorie malnutrition: Secondary | ICD-10-CM | POA: Diagnosis not present

## 2022-09-29 DIAGNOSIS — T8131XA Disruption of external operation (surgical) wound, not elsewhere classified, initial encounter: Secondary | ICD-10-CM | POA: Diagnosis not present

## 2022-09-29 DIAGNOSIS — Z950 Presence of cardiac pacemaker: Secondary | ICD-10-CM | POA: Diagnosis not present

## 2022-09-29 DIAGNOSIS — I872 Venous insufficiency (chronic) (peripheral): Secondary | ICD-10-CM | POA: Diagnosis not present

## 2022-09-29 DIAGNOSIS — E7800 Pure hypercholesterolemia, unspecified: Secondary | ICD-10-CM | POA: Diagnosis not present

## 2022-09-29 DIAGNOSIS — I480 Paroxysmal atrial fibrillation: Secondary | ICD-10-CM | POA: Diagnosis not present

## 2022-09-29 DIAGNOSIS — G2581 Restless legs syndrome: Secondary | ICD-10-CM | POA: Diagnosis not present

## 2022-09-29 DIAGNOSIS — I2510 Atherosclerotic heart disease of native coronary artery without angina pectoris: Secondary | ICD-10-CM | POA: Diagnosis not present

## 2022-09-29 DIAGNOSIS — I453 Trifascicular block: Secondary | ICD-10-CM | POA: Diagnosis not present

## 2022-09-29 DIAGNOSIS — K219 Gastro-esophageal reflux disease without esophagitis: Secondary | ICD-10-CM | POA: Diagnosis not present

## 2022-09-29 DIAGNOSIS — M316 Other giant cell arteritis: Secondary | ICD-10-CM | POA: Diagnosis not present

## 2022-09-29 DIAGNOSIS — E538 Deficiency of other specified B group vitamins: Secondary | ICD-10-CM | POA: Diagnosis not present

## 2022-09-29 DIAGNOSIS — I130 Hypertensive heart and chronic kidney disease with heart failure and stage 1 through stage 4 chronic kidney disease, or unspecified chronic kidney disease: Secondary | ICD-10-CM | POA: Diagnosis not present

## 2022-09-29 DIAGNOSIS — I252 Old myocardial infarction: Secondary | ICD-10-CM | POA: Diagnosis not present

## 2022-10-01 DIAGNOSIS — D631 Anemia in chronic kidney disease: Secondary | ICD-10-CM | POA: Diagnosis not present

## 2022-10-01 DIAGNOSIS — Z952 Presence of prosthetic heart valve: Secondary | ICD-10-CM | POA: Diagnosis not present

## 2022-10-01 DIAGNOSIS — Z7984 Long term (current) use of oral hypoglycemic drugs: Secondary | ICD-10-CM | POA: Diagnosis not present

## 2022-10-01 DIAGNOSIS — Z950 Presence of cardiac pacemaker: Secondary | ICD-10-CM | POA: Diagnosis not present

## 2022-10-01 DIAGNOSIS — I130 Hypertensive heart and chronic kidney disease with heart failure and stage 1 through stage 4 chronic kidney disease, or unspecified chronic kidney disease: Secondary | ICD-10-CM | POA: Diagnosis not present

## 2022-10-01 DIAGNOSIS — I5033 Acute on chronic diastolic (congestive) heart failure: Secondary | ICD-10-CM | POA: Diagnosis not present

## 2022-10-01 DIAGNOSIS — I2510 Atherosclerotic heart disease of native coronary artery without angina pectoris: Secondary | ICD-10-CM | POA: Diagnosis not present

## 2022-10-01 DIAGNOSIS — Z955 Presence of coronary angioplasty implant and graft: Secondary | ICD-10-CM | POA: Diagnosis not present

## 2022-10-01 DIAGNOSIS — M316 Other giant cell arteritis: Secondary | ICD-10-CM | POA: Diagnosis not present

## 2022-10-01 DIAGNOSIS — J9602 Acute respiratory failure with hypercapnia: Secondary | ICD-10-CM | POA: Diagnosis not present

## 2022-10-01 DIAGNOSIS — G2581 Restless legs syndrome: Secondary | ICD-10-CM | POA: Diagnosis not present

## 2022-10-01 DIAGNOSIS — I480 Paroxysmal atrial fibrillation: Secondary | ICD-10-CM | POA: Diagnosis not present

## 2022-10-01 DIAGNOSIS — I739 Peripheral vascular disease, unspecified: Secondary | ICD-10-CM | POA: Diagnosis not present

## 2022-10-01 DIAGNOSIS — I453 Trifascicular block: Secondary | ICD-10-CM | POA: Diagnosis not present

## 2022-10-01 DIAGNOSIS — N184 Chronic kidney disease, stage 4 (severe): Secondary | ICD-10-CM | POA: Diagnosis not present

## 2022-10-01 DIAGNOSIS — I872 Venous insufficiency (chronic) (peripheral): Secondary | ICD-10-CM | POA: Diagnosis not present

## 2022-10-01 DIAGNOSIS — E538 Deficiency of other specified B group vitamins: Secondary | ICD-10-CM | POA: Diagnosis not present

## 2022-10-01 DIAGNOSIS — T8131XA Disruption of external operation (surgical) wound, not elsewhere classified, initial encounter: Secondary | ICD-10-CM | POA: Diagnosis not present

## 2022-10-01 DIAGNOSIS — K219 Gastro-esophageal reflux disease without esophagitis: Secondary | ICD-10-CM | POA: Diagnosis not present

## 2022-10-01 DIAGNOSIS — E440 Moderate protein-calorie malnutrition: Secondary | ICD-10-CM | POA: Diagnosis not present

## 2022-10-01 DIAGNOSIS — M6281 Muscle weakness (generalized): Secondary | ICD-10-CM | POA: Diagnosis not present

## 2022-10-01 DIAGNOSIS — I252 Old myocardial infarction: Secondary | ICD-10-CM | POA: Diagnosis not present

## 2022-10-01 DIAGNOSIS — M810 Age-related osteoporosis without current pathological fracture: Secondary | ICD-10-CM | POA: Diagnosis not present

## 2022-10-01 DIAGNOSIS — E7800 Pure hypercholesterolemia, unspecified: Secondary | ICD-10-CM | POA: Diagnosis not present

## 2022-10-05 DIAGNOSIS — Z955 Presence of coronary angioplasty implant and graft: Secondary | ICD-10-CM | POA: Diagnosis not present

## 2022-10-05 DIAGNOSIS — E538 Deficiency of other specified B group vitamins: Secondary | ICD-10-CM | POA: Diagnosis not present

## 2022-10-05 DIAGNOSIS — K219 Gastro-esophageal reflux disease without esophagitis: Secondary | ICD-10-CM | POA: Diagnosis not present

## 2022-10-05 DIAGNOSIS — D631 Anemia in chronic kidney disease: Secondary | ICD-10-CM | POA: Diagnosis not present

## 2022-10-05 DIAGNOSIS — N184 Chronic kidney disease, stage 4 (severe): Secondary | ICD-10-CM | POA: Diagnosis not present

## 2022-10-05 DIAGNOSIS — G2581 Restless legs syndrome: Secondary | ICD-10-CM | POA: Diagnosis not present

## 2022-10-05 DIAGNOSIS — Z7984 Long term (current) use of oral hypoglycemic drugs: Secondary | ICD-10-CM | POA: Diagnosis not present

## 2022-10-05 DIAGNOSIS — I5033 Acute on chronic diastolic (congestive) heart failure: Secondary | ICD-10-CM | POA: Diagnosis not present

## 2022-10-05 DIAGNOSIS — I252 Old myocardial infarction: Secondary | ICD-10-CM | POA: Diagnosis not present

## 2022-10-05 DIAGNOSIS — I130 Hypertensive heart and chronic kidney disease with heart failure and stage 1 through stage 4 chronic kidney disease, or unspecified chronic kidney disease: Secondary | ICD-10-CM | POA: Diagnosis not present

## 2022-10-05 DIAGNOSIS — I453 Trifascicular block: Secondary | ICD-10-CM | POA: Diagnosis not present

## 2022-10-05 DIAGNOSIS — E440 Moderate protein-calorie malnutrition: Secondary | ICD-10-CM | POA: Diagnosis not present

## 2022-10-05 DIAGNOSIS — Z952 Presence of prosthetic heart valve: Secondary | ICD-10-CM | POA: Diagnosis not present

## 2022-10-05 DIAGNOSIS — I2510 Atherosclerotic heart disease of native coronary artery without angina pectoris: Secondary | ICD-10-CM | POA: Diagnosis not present

## 2022-10-05 DIAGNOSIS — M6281 Muscle weakness (generalized): Secondary | ICD-10-CM | POA: Diagnosis not present

## 2022-10-05 DIAGNOSIS — E7800 Pure hypercholesterolemia, unspecified: Secondary | ICD-10-CM | POA: Diagnosis not present

## 2022-10-05 DIAGNOSIS — J9602 Acute respiratory failure with hypercapnia: Secondary | ICD-10-CM | POA: Diagnosis not present

## 2022-10-05 DIAGNOSIS — M316 Other giant cell arteritis: Secondary | ICD-10-CM | POA: Diagnosis not present

## 2022-10-05 DIAGNOSIS — I872 Venous insufficiency (chronic) (peripheral): Secondary | ICD-10-CM | POA: Diagnosis not present

## 2022-10-05 DIAGNOSIS — I480 Paroxysmal atrial fibrillation: Secondary | ICD-10-CM | POA: Diagnosis not present

## 2022-10-05 DIAGNOSIS — T8131XA Disruption of external operation (surgical) wound, not elsewhere classified, initial encounter: Secondary | ICD-10-CM | POA: Diagnosis not present

## 2022-10-05 DIAGNOSIS — M810 Age-related osteoporosis without current pathological fracture: Secondary | ICD-10-CM | POA: Diagnosis not present

## 2022-10-05 DIAGNOSIS — I739 Peripheral vascular disease, unspecified: Secondary | ICD-10-CM | POA: Diagnosis not present

## 2022-10-05 DIAGNOSIS — Z950 Presence of cardiac pacemaker: Secondary | ICD-10-CM | POA: Diagnosis not present

## 2022-10-08 DIAGNOSIS — Z952 Presence of prosthetic heart valve: Secondary | ICD-10-CM | POA: Diagnosis not present

## 2022-10-08 DIAGNOSIS — I5033 Acute on chronic diastolic (congestive) heart failure: Secondary | ICD-10-CM | POA: Diagnosis not present

## 2022-10-08 DIAGNOSIS — Z955 Presence of coronary angioplasty implant and graft: Secondary | ICD-10-CM | POA: Diagnosis not present

## 2022-10-08 DIAGNOSIS — M316 Other giant cell arteritis: Secondary | ICD-10-CM | POA: Diagnosis not present

## 2022-10-08 DIAGNOSIS — M6281 Muscle weakness (generalized): Secondary | ICD-10-CM | POA: Diagnosis not present

## 2022-10-08 DIAGNOSIS — E440 Moderate protein-calorie malnutrition: Secondary | ICD-10-CM | POA: Diagnosis not present

## 2022-10-08 DIAGNOSIS — I480 Paroxysmal atrial fibrillation: Secondary | ICD-10-CM | POA: Diagnosis not present

## 2022-10-08 DIAGNOSIS — G2581 Restless legs syndrome: Secondary | ICD-10-CM | POA: Diagnosis not present

## 2022-10-08 DIAGNOSIS — J9602 Acute respiratory failure with hypercapnia: Secondary | ICD-10-CM | POA: Diagnosis not present

## 2022-10-08 DIAGNOSIS — T8131XA Disruption of external operation (surgical) wound, not elsewhere classified, initial encounter: Secondary | ICD-10-CM | POA: Diagnosis not present

## 2022-10-08 DIAGNOSIS — D631 Anemia in chronic kidney disease: Secondary | ICD-10-CM | POA: Diagnosis not present

## 2022-10-08 DIAGNOSIS — K219 Gastro-esophageal reflux disease without esophagitis: Secondary | ICD-10-CM | POA: Diagnosis not present

## 2022-10-08 DIAGNOSIS — I2510 Atherosclerotic heart disease of native coronary artery without angina pectoris: Secondary | ICD-10-CM | POA: Diagnosis not present

## 2022-10-08 DIAGNOSIS — I872 Venous insufficiency (chronic) (peripheral): Secondary | ICD-10-CM | POA: Diagnosis not present

## 2022-10-08 DIAGNOSIS — Z950 Presence of cardiac pacemaker: Secondary | ICD-10-CM | POA: Diagnosis not present

## 2022-10-08 DIAGNOSIS — I739 Peripheral vascular disease, unspecified: Secondary | ICD-10-CM | POA: Diagnosis not present

## 2022-10-08 DIAGNOSIS — E7800 Pure hypercholesterolemia, unspecified: Secondary | ICD-10-CM | POA: Diagnosis not present

## 2022-10-08 DIAGNOSIS — M810 Age-related osteoporosis without current pathological fracture: Secondary | ICD-10-CM | POA: Diagnosis not present

## 2022-10-08 DIAGNOSIS — I252 Old myocardial infarction: Secondary | ICD-10-CM | POA: Diagnosis not present

## 2022-10-08 DIAGNOSIS — I453 Trifascicular block: Secondary | ICD-10-CM | POA: Diagnosis not present

## 2022-10-08 DIAGNOSIS — Z7984 Long term (current) use of oral hypoglycemic drugs: Secondary | ICD-10-CM | POA: Diagnosis not present

## 2022-10-08 DIAGNOSIS — E538 Deficiency of other specified B group vitamins: Secondary | ICD-10-CM | POA: Diagnosis not present

## 2022-10-08 DIAGNOSIS — I130 Hypertensive heart and chronic kidney disease with heart failure and stage 1 through stage 4 chronic kidney disease, or unspecified chronic kidney disease: Secondary | ICD-10-CM | POA: Diagnosis not present

## 2022-10-08 DIAGNOSIS — N184 Chronic kidney disease, stage 4 (severe): Secondary | ICD-10-CM | POA: Diagnosis not present

## 2022-10-16 ENCOUNTER — Emergency Department: Payer: Medicare Other

## 2022-10-16 ENCOUNTER — Other Ambulatory Visit: Payer: Self-pay

## 2022-10-16 ENCOUNTER — Emergency Department
Admission: EM | Admit: 2022-10-16 | Discharge: 2022-10-17 | Disposition: A | Discharge status: Home / Self Care | Payer: Medicare Other | Attending: Emergency Medicine | Admitting: Emergency Medicine

## 2022-10-16 DIAGNOSIS — S0990XA Unspecified injury of head, initial encounter: Secondary | ICD-10-CM | POA: Diagnosis not present

## 2022-10-16 DIAGNOSIS — S51812A Laceration without foreign body of left forearm, initial encounter: Secondary | ICD-10-CM | POA: Insufficient documentation

## 2022-10-16 DIAGNOSIS — S41111A Laceration without foreign body of right upper arm, initial encounter: Secondary | ICD-10-CM | POA: Diagnosis not present

## 2022-10-16 DIAGNOSIS — Z043 Encounter for examination and observation following other accident: Secondary | ICD-10-CM | POA: Diagnosis not present

## 2022-10-16 DIAGNOSIS — M47815 Spondylosis without myelopathy or radiculopathy, thoracolumbar region: Secondary | ICD-10-CM | POA: Diagnosis not present

## 2022-10-16 DIAGNOSIS — Z950 Presence of cardiac pacemaker: Secondary | ICD-10-CM | POA: Diagnosis not present

## 2022-10-16 DIAGNOSIS — S0003XA Contusion of scalp, initial encounter: Secondary | ICD-10-CM | POA: Insufficient documentation

## 2022-10-16 DIAGNOSIS — I509 Heart failure, unspecified: Secondary | ICD-10-CM | POA: Insufficient documentation

## 2022-10-16 DIAGNOSIS — Y92009 Unspecified place in unspecified non-institutional (private) residence as the place of occurrence of the external cause: Secondary | ICD-10-CM | POA: Diagnosis not present

## 2022-10-16 DIAGNOSIS — R0902 Hypoxemia: Secondary | ICD-10-CM | POA: Diagnosis not present

## 2022-10-16 DIAGNOSIS — W19XXXA Unspecified fall, initial encounter: Secondary | ICD-10-CM | POA: Diagnosis not present

## 2022-10-16 DIAGNOSIS — S51811A Laceration without foreign body of right forearm, initial encounter: Secondary | ICD-10-CM | POA: Insufficient documentation

## 2022-10-16 DIAGNOSIS — I700 Atherosclerosis of aorta: Secondary | ICD-10-CM | POA: Diagnosis not present

## 2022-10-16 DIAGNOSIS — S59911A Unspecified injury of right forearm, initial encounter: Secondary | ICD-10-CM | POA: Diagnosis present

## 2022-10-16 DIAGNOSIS — I10 Essential (primary) hypertension: Secondary | ICD-10-CM | POA: Diagnosis not present

## 2022-10-16 DIAGNOSIS — R6889 Other general symptoms and signs: Secondary | ICD-10-CM | POA: Diagnosis not present

## 2022-10-16 DIAGNOSIS — I214 Non-ST elevation (NSTEMI) myocardial infarction: Secondary | ICD-10-CM | POA: Diagnosis not present

## 2022-10-16 DIAGNOSIS — M25552 Pain in left hip: Secondary | ICD-10-CM | POA: Diagnosis not present

## 2022-10-16 DIAGNOSIS — G319 Degenerative disease of nervous system, unspecified: Secondary | ICD-10-CM | POA: Diagnosis not present

## 2022-10-16 DIAGNOSIS — T148XXA Other injury of unspecified body region, initial encounter: Secondary | ICD-10-CM

## 2022-10-16 DIAGNOSIS — Z743 Need for continuous supervision: Secondary | ICD-10-CM | POA: Diagnosis not present

## 2022-10-16 DIAGNOSIS — S06320A Contusion and laceration of left cerebrum without loss of consciousness, initial encounter: Secondary | ICD-10-CM | POA: Diagnosis not present

## 2022-10-16 LAB — CBC
HCT: 37.2 % (ref 36.0–46.0)
Hemoglobin: 12 g/dL (ref 12.0–15.0)
MCH: 28.5 pg (ref 26.0–34.0)
MCHC: 32.3 g/dL (ref 30.0–36.0)
MCV: 88.4 fL (ref 80.0–100.0)
Platelets: 172 10*3/uL (ref 150–400)
RBC: 4.21 MIL/uL (ref 3.87–5.11)
RDW: 14.6 % (ref 11.5–15.5)
WBC: 6.6 10*3/uL (ref 4.0–10.5)
nRBC: 0 % (ref 0.0–0.2)

## 2022-10-16 LAB — COMPREHENSIVE METABOLIC PANEL
ALT: 10 U/L (ref 0–44)
AST: 26 U/L (ref 15–41)
Albumin: 3.7 g/dL (ref 3.5–5.0)
Alkaline Phosphatase: 75 U/L (ref 38–126)
Anion gap: 12 (ref 5–15)
BUN: 38 mg/dL — ABNORMAL HIGH (ref 8–23)
CO2: 29 mmol/L (ref 22–32)
Calcium: 9.5 mg/dL (ref 8.9–10.3)
Chloride: 94 mmol/L — ABNORMAL LOW (ref 98–111)
Creatinine, Ser: 2.24 mg/dL — ABNORMAL HIGH (ref 0.44–1.00)
GFR, Estimated: 21 mL/min — ABNORMAL LOW (ref >60)
Glucose, Bld: 106 mg/dL — ABNORMAL HIGH (ref 70–99)
Potassium: 3 mmol/L — ABNORMAL LOW (ref 3.5–5.1)
Sodium: 135 mmol/L (ref 135–145)
Total Bilirubin: 1.2 mg/dL (ref 0.3–1.2)
Total Protein: 8.2 g/dL — ABNORMAL HIGH (ref 6.5–8.1)

## 2022-10-16 NOTE — ED Triage Notes (Addendum)
First nurse note:   BIB [facility] from home. C/o fall from standing position. Unsure of cause but denies dizziness. Normally ambulatory with walker at home. Denies LOC at home. Hx of CHF and kidney disease. Denies daily thinners. Pt alert and oriented with EMS. EMS reports hematoma to L elbow, LFA, L side of head posterior aspect. L hip pain from prior fall. No shortening or rotation noted by EMS. R lower back pain also reported. Pt has pacemaker  Standing bp 76/53 Sitting bp 148/84

## 2022-10-16 NOTE — ED Triage Notes (Signed)
Pt to ed from home via [facility]. See first nurse note.

## 2022-10-17 MED ORDER — SODIUM CHLORIDE 0.9 % IV BOLUS: 500.0000 mL | Freq: Once | INTRAVENOUS | Status: DC

## 2022-10-17 MED ORDER — POTASSIUM CHLORIDE CRYS ER 20 MEQ PO TBCR
40.0000 meq | EXTENDED_RELEASE_TABLET | Freq: Once | ORAL | Status: AC
  Administered 2022-10-17: 40 meq via ORAL
  Filled 2022-10-17: qty 2

## 2022-10-17 NOTE — Group Note (Deleted)
Date:  10/17/2022 Time:  2:12 AM  Group Topic/Focus:  Wrap-Up Group:   The focus of this group is to help patients review their daily goal of treatment and discuss progress on daily workbooks.     Participation Level:  {BHH PARTICIPATION ZOXWR:60454}  Participation Quality:  {BHH PARTICIPATION QUALITY:22265}  Affect:  {BHH AFFECT:22266}  Cognitive:  {BHH COGNITIVE:22267}  Insight: {BHH Insight2:20797}  Engagement in Group:  {BHH ENGAGEMENT IN UJWJX:91478}  Modes of Intervention:  {BHH MODES OF INTERVENTION:22269}  Additional Comments:  ***  Maglione,Britt Theard E 10/17/2022, 2:12 AM

## 2022-10-17 NOTE — Discharge Instructions (Signed)
You have been seen in the Emergency Department (ED) today for a fall.  Your work up does not show any concerning injuries.  Please take over-the-counter acetaminophen (Tylenol) as needed for your pain (unless you have an allergy or your doctor as told you not to take them), or take any prescribed medication as instructed.  As we discussed, you may benefit from using cold packs on the hematoma on your left elbow and on the back of your head.  The skin tears will have to heal over time.  For the skin tear treated with skin tape, avoid putting any bacitracin or other antibiotic ointment on it because they will make the skin take come off faster.  You can use nonadhesive dressings to keep the wounds covered.  Please follow up with your doctor regarding today's Emergency Department (ED) visit and your recent fall.    Return to the ED if you have any headache, confusion, slurred speech, weakness/numbness of any arm or leg, or any increased pain.

## 2022-10-17 NOTE — ED Provider Notes (Signed)
[facility] Provider Note    Event Date/Time   First MD Initiated Contact with Patient 10/16/22 2333     (approximate)   History   Fall   HPI Graisyn Lemaster is a 82 y.o. female whose medical history includes anemia, prior ACS, history of CHF, femoral artery occlusion seeing Dr. Wyn Quaker, etc.  She presents tonight for evaluation after a fall.  She was brought by EMS after falling at home.  She states that she was ambulatory and then bent over to pick up something off of the floor when she passed out, falling backwards and striking the back of her head on the floor.  She takes Plavix according to her medical record but she cannot remember for sure.  She has some hip pain but is able to bear weight at her baseline.  She has swelling to the back of her head where she hit her head, as well as some swelling and bruising to her elbow.  She has a new skin tear on her right arm and also has multiple prior skin tears from other recent injuries.  She denies chest pain, shortness of breath, nausea, vomiting, and abdominal pain.  The back of her head hurts but she has no pain in her neck.     Physical Exam   Triage Vital Signs: ED Triage Vitals  Encounter Vitals Group     BP 10/16/22 2022 124/72     Systolic BP Percentile --      Diastolic BP Percentile --      Pulse Rate 10/16/22 2022 99     Resp 10/16/22 2022 16     Temp 10/16/22 2022 98 F (36.7 C)     Temp Source 10/16/22 2022 Oral     SpO2 10/16/22 2022 98 %     Weight --      Height 10/16/22 2023 1.626 m (5\' 4" )     Head Circumference --      Peak Flow --      Pain Score 10/16/22 2022 5     Pain Loc --      Pain Education --      Exclude from Growth Chart --     Most recent vital signs: Vitals:   10/17/22 0200 10/17/22 0230  BP: (!) 111/51 97/83  Pulse: 72 71  Resp:  18  Temp:    SpO2:  100%    General: Elderly but awake and alert, oriented, communicative, no distress, good spirits despite the  fall and injuries. Head/neck: Large posterior hematoma that is tender, erythema/contusion, but no laceration.  No tenderness to palpation or manipulation of the patient's cervical spine. CV:  Good peripheral perfusion.  Pacemaker in place. Resp:  Normal effort. Speaking easily and comfortably, no accessory muscle usage nor intercostal retractions.   Abd:  No distention.  No tenderness to palpation. Other:  Multiple skin tears and other wounds on both of her arms of various ages including an acute skin tear on her right upper arm which her nurse, Feliz Beam, approximated and treated with Steri-Strips at my recommendation.   ED Results / Procedures / Treatments   Labs (all labs ordered are listed, but only abnormal results are displayed) Labs Reviewed  COMPREHENSIVE METABOLIC PANEL - Abnormal; Notable for the following components:      Result Value   Potassium 3.0 (*)    Chloride 94 (*)    Glucose, Bld 106 (*)    BUN 38 (*)  Creatinine, Ser 2.24 (*)    Total Protein 8.2 (*)    GFR, Estimated 21 (*)    All other components within normal limits  CBC     RADIOLOGY I viewed and interpreted the patient's head CT and cervical spine CT and I see no evidence of acute intracranial hemorrhage nor skull fracture.  Patient has a large posterior hematoma.  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   PROCEDURES:  Critical Care performed: No  .1-3 Lead EKG Interpretation  Performed by: Loleta Rose, MD Authorized by: Loleta Rose, MD     Interpretation: normal     ECG rate:  70   ECG rate assessment: normal     Rhythm: sinus rhythm     Ectopy: none     Conduction: normal       IMPRESSION / MDM / ASSESSMENT AND PLAN / ED COURSE  I reviewed the triage vital signs and the nursing notes.                              Differential diagnosis includes, but is not limited to,  vasovagal episode, orthostatic episode, pacemaker malfunction, electrolyte or metabolic abnormality.  Patient's presentation is most consistent with acute presentation with potential threat to life or bodily function.  Labs/studies ordered: CMP, CBC, CT head, CT cervical spine, thoracic spine x-rays  Interventions/Medications given:  Medications  potassium chloride SA (KLOR-CON M) CR tablet 40 mEq (40 mEq Oral Given 10/17/22 0307)    (Note:  hospital course my include additional interventions and/or labs/studies not listed above.)   Patient is vital signs have been stable in the emergency department.  Reportedly she was orthostatic for EMS but she was not clinically so since coming to the emergency department and was able to ambulate at her baseline level (which requires some assistance).  She has not had any chest pain or shortness of breath.  Her renal function is chronically poor but is essentially unchanged.  Her potassium is a bit low at 3.0 but I ordered repletion of 40 mill equivalents by mouth.  No anemia at this time.  Imaging reassuring as documented above.  The radiologist mention the possibility of subclavian steal syndrome, but that does not appear to be in play at this time.  Her symptoms do not appear to be related to the use of her arm and this seems to be more of a mechanical or positional issue when she bent over to pick up something off of the floor.  The patient is on the cardiac monitor to evaluate for evidence of arrhythmia and/or significant heart rate changes.       FINAL CLINICAL IMPRESSION(S) / ED DIAGNOSES   Final diagnoses:  Fall, initial encounter  Minor head injury, initial encounter  Hematoma of scalp, initial encounter  Multiple skin tears     Rx / DC Orders   ED Discharge Orders     None        Note:  This document was prepared using Dragon voice recognition software and may include unintentional dictation errors.   Loleta Rose,  MD 10/17/22 [phone]

## 2022-10-17 NOTE — ED Notes (Signed)
RN assessed skin tear on pt R upper arm.  Wound was cleaned with 30 mL saline.  Wound approximated and secured with steristrips.  Wound was then covered with nonadherent dressing and paper tape.

## 2022-10-22 DIAGNOSIS — M6281 Muscle weakness (generalized): Secondary | ICD-10-CM | POA: Diagnosis not present

## 2022-10-22 DIAGNOSIS — M316 Other giant cell arteritis: Secondary | ICD-10-CM | POA: Diagnosis not present

## 2022-10-22 DIAGNOSIS — T8131XA Disruption of external operation (surgical) wound, not elsewhere classified, initial encounter: Secondary | ICD-10-CM | POA: Diagnosis not present

## 2022-10-22 DIAGNOSIS — Z952 Presence of prosthetic heart valve: Secondary | ICD-10-CM | POA: Diagnosis not present

## 2022-10-22 DIAGNOSIS — M810 Age-related osteoporosis without current pathological fracture: Secondary | ICD-10-CM | POA: Diagnosis not present

## 2022-10-22 DIAGNOSIS — E538 Deficiency of other specified B group vitamins: Secondary | ICD-10-CM | POA: Diagnosis not present

## 2022-10-22 DIAGNOSIS — I480 Paroxysmal atrial fibrillation: Secondary | ICD-10-CM | POA: Diagnosis not present

## 2022-10-22 DIAGNOSIS — E440 Moderate protein-calorie malnutrition: Secondary | ICD-10-CM | POA: Diagnosis not present

## 2022-10-22 DIAGNOSIS — I130 Hypertensive heart and chronic kidney disease with heart failure and stage 1 through stage 4 chronic kidney disease, or unspecified chronic kidney disease: Secondary | ICD-10-CM | POA: Diagnosis not present

## 2022-10-22 DIAGNOSIS — J9602 Acute respiratory failure with hypercapnia: Secondary | ICD-10-CM | POA: Diagnosis not present

## 2022-10-22 DIAGNOSIS — I252 Old myocardial infarction: Secondary | ICD-10-CM | POA: Diagnosis not present

## 2022-10-22 DIAGNOSIS — G2581 Restless legs syndrome: Secondary | ICD-10-CM | POA: Diagnosis not present

## 2022-10-22 DIAGNOSIS — Z7984 Long term (current) use of oral hypoglycemic drugs: Secondary | ICD-10-CM | POA: Diagnosis not present

## 2022-10-22 DIAGNOSIS — D631 Anemia in chronic kidney disease: Secondary | ICD-10-CM | POA: Diagnosis not present

## 2022-10-22 DIAGNOSIS — I872 Venous insufficiency (chronic) (peripheral): Secondary | ICD-10-CM | POA: Diagnosis not present

## 2022-10-22 DIAGNOSIS — I739 Peripheral vascular disease, unspecified: Secondary | ICD-10-CM | POA: Diagnosis not present

## 2022-10-22 DIAGNOSIS — I2510 Atherosclerotic heart disease of native coronary artery without angina pectoris: Secondary | ICD-10-CM | POA: Diagnosis not present

## 2022-10-22 DIAGNOSIS — I5033 Acute on chronic diastolic (congestive) heart failure: Secondary | ICD-10-CM | POA: Diagnosis not present

## 2022-10-22 DIAGNOSIS — Z950 Presence of cardiac pacemaker: Secondary | ICD-10-CM | POA: Diagnosis not present

## 2022-10-22 DIAGNOSIS — K219 Gastro-esophageal reflux disease without esophagitis: Secondary | ICD-10-CM | POA: Diagnosis not present

## 2022-10-22 DIAGNOSIS — E7800 Pure hypercholesterolemia, unspecified: Secondary | ICD-10-CM | POA: Diagnosis not present

## 2022-10-22 DIAGNOSIS — N184 Chronic kidney disease, stage 4 (severe): Secondary | ICD-10-CM | POA: Diagnosis not present

## 2022-10-22 DIAGNOSIS — I453 Trifascicular block: Secondary | ICD-10-CM | POA: Diagnosis not present

## 2022-10-22 DIAGNOSIS — Z955 Presence of coronary angioplasty implant and graft: Secondary | ICD-10-CM | POA: Diagnosis not present

## 2022-10-23 ENCOUNTER — Telehealth: Payer: Self-pay

## 2022-10-23 NOTE — Telephone Encounter (Signed)
Transition Care Management Follow-up Telephone Call Date of discharge and from where:  8/10 How have you been since you were released from the hospital? Doing fine  Any questions or concerns? No  Items Reviewed: Did the pt receive and understand the discharge instructions provided? Yes  Medications obtained and verified? Yes  Other? No  Any new allergies since your discharge? No  Dietary orders reviewed? No Do you have support at home? Yes     Follow up appointments reviewed:  PCP Hospital f/u appt confirmed? Yes  Scheduled to see PCP on THE PHONE  @ . Specialist Hospital f/u appt confirmed? No  Scheduled to see  on  @ . Are transportation arrangements needed? No  If their condition worsens, is the pt aware to call PCP or go to the Emergency Dept.? Yes Was the patient provided with contact information for the PCP's office or ED? Yes Was to pt encouraged to call back with questions or concerns? Yes

## 2022-10-23 NOTE — Telephone Encounter (Signed)
Transition Care Management Unsuccessful Follow-up Telephone Call  Date of discharge and from where:  [place] 8/10  Attempts:  2nd Attempt  Reason for unsuccessful TCM follow-up call:  No answer/busy   Lenard Forth [facility] Guide, [facility] [phone] [address] [place], [place], [place] [address] Phone: [phone] Email: [email or web address]

## 2022-10-26 DIAGNOSIS — Z955 Presence of coronary angioplasty implant and graft: Secondary | ICD-10-CM | POA: Diagnosis not present

## 2022-10-26 DIAGNOSIS — I453 Trifascicular block: Secondary | ICD-10-CM | POA: Diagnosis not present

## 2022-10-26 DIAGNOSIS — Z950 Presence of cardiac pacemaker: Secondary | ICD-10-CM | POA: Diagnosis not present

## 2022-10-26 DIAGNOSIS — E538 Deficiency of other specified B group vitamins: Secondary | ICD-10-CM | POA: Diagnosis not present

## 2022-10-26 DIAGNOSIS — M316 Other giant cell arteritis: Secondary | ICD-10-CM | POA: Diagnosis not present

## 2022-10-26 DIAGNOSIS — I739 Peripheral vascular disease, unspecified: Secondary | ICD-10-CM | POA: Diagnosis not present

## 2022-10-26 DIAGNOSIS — I2510 Atherosclerotic heart disease of native coronary artery without angina pectoris: Secondary | ICD-10-CM | POA: Diagnosis not present

## 2022-10-26 DIAGNOSIS — M810 Age-related osteoporosis without current pathological fracture: Secondary | ICD-10-CM | POA: Diagnosis not present

## 2022-10-26 DIAGNOSIS — I872 Venous insufficiency (chronic) (peripheral): Secondary | ICD-10-CM | POA: Diagnosis not present

## 2022-10-26 DIAGNOSIS — Z952 Presence of prosthetic heart valve: Secondary | ICD-10-CM | POA: Diagnosis not present

## 2022-10-26 DIAGNOSIS — E7800 Pure hypercholesterolemia, unspecified: Secondary | ICD-10-CM | POA: Diagnosis not present

## 2022-10-26 DIAGNOSIS — I480 Paroxysmal atrial fibrillation: Secondary | ICD-10-CM | POA: Diagnosis not present

## 2022-10-26 DIAGNOSIS — J9602 Acute respiratory failure with hypercapnia: Secondary | ICD-10-CM | POA: Diagnosis not present

## 2022-10-26 DIAGNOSIS — T8131XA Disruption of external operation (surgical) wound, not elsewhere classified, initial encounter: Secondary | ICD-10-CM | POA: Diagnosis not present

## 2022-10-26 DIAGNOSIS — K219 Gastro-esophageal reflux disease without esophagitis: Secondary | ICD-10-CM | POA: Diagnosis not present

## 2022-10-26 DIAGNOSIS — I5033 Acute on chronic diastolic (congestive) heart failure: Secondary | ICD-10-CM | POA: Diagnosis not present

## 2022-10-26 DIAGNOSIS — D631 Anemia in chronic kidney disease: Secondary | ICD-10-CM | POA: Diagnosis not present

## 2022-10-26 DIAGNOSIS — G2581 Restless legs syndrome: Secondary | ICD-10-CM | POA: Diagnosis not present

## 2022-10-26 DIAGNOSIS — I130 Hypertensive heart and chronic kidney disease with heart failure and stage 1 through stage 4 chronic kidney disease, or unspecified chronic kidney disease: Secondary | ICD-10-CM | POA: Diagnosis not present

## 2022-10-26 DIAGNOSIS — Z7984 Long term (current) use of oral hypoglycemic drugs: Secondary | ICD-10-CM | POA: Diagnosis not present

## 2022-10-26 DIAGNOSIS — E440 Moderate protein-calorie malnutrition: Secondary | ICD-10-CM | POA: Diagnosis not present

## 2022-10-26 DIAGNOSIS — M6281 Muscle weakness (generalized): Secondary | ICD-10-CM | POA: Diagnosis not present

## 2022-10-26 DIAGNOSIS — N184 Chronic kidney disease, stage 4 (severe): Secondary | ICD-10-CM | POA: Diagnosis not present

## 2022-10-26 DIAGNOSIS — I252 Old myocardial infarction: Secondary | ICD-10-CM | POA: Diagnosis not present

## 2022-10-29 DIAGNOSIS — M6281 Muscle weakness (generalized): Secondary | ICD-10-CM | POA: Diagnosis not present

## 2022-10-29 DIAGNOSIS — I2510 Atherosclerotic heart disease of native coronary artery without angina pectoris: Secondary | ICD-10-CM | POA: Diagnosis not present

## 2022-10-29 DIAGNOSIS — I872 Venous insufficiency (chronic) (peripheral): Secondary | ICD-10-CM | POA: Diagnosis not present

## 2022-10-29 DIAGNOSIS — I453 Trifascicular block: Secondary | ICD-10-CM | POA: Diagnosis not present

## 2022-10-29 DIAGNOSIS — I252 Old myocardial infarction: Secondary | ICD-10-CM | POA: Diagnosis not present

## 2022-10-29 DIAGNOSIS — Z7984 Long term (current) use of oral hypoglycemic drugs: Secondary | ICD-10-CM | POA: Diagnosis not present

## 2022-10-29 DIAGNOSIS — E7800 Pure hypercholesterolemia, unspecified: Secondary | ICD-10-CM | POA: Diagnosis not present

## 2022-10-29 DIAGNOSIS — M316 Other giant cell arteritis: Secondary | ICD-10-CM | POA: Diagnosis not present

## 2022-10-29 DIAGNOSIS — E440 Moderate protein-calorie malnutrition: Secondary | ICD-10-CM | POA: Diagnosis not present

## 2022-10-29 DIAGNOSIS — E538 Deficiency of other specified B group vitamins: Secondary | ICD-10-CM | POA: Diagnosis not present

## 2022-10-29 DIAGNOSIS — I5033 Acute on chronic diastolic (congestive) heart failure: Secondary | ICD-10-CM | POA: Diagnosis not present

## 2022-10-29 DIAGNOSIS — M810 Age-related osteoporosis without current pathological fracture: Secondary | ICD-10-CM | POA: Diagnosis not present

## 2022-10-29 DIAGNOSIS — T8131XA Disruption of external operation (surgical) wound, not elsewhere classified, initial encounter: Secondary | ICD-10-CM | POA: Diagnosis not present

## 2022-10-29 DIAGNOSIS — I480 Paroxysmal atrial fibrillation: Secondary | ICD-10-CM | POA: Diagnosis not present

## 2022-10-29 DIAGNOSIS — I739 Peripheral vascular disease, unspecified: Secondary | ICD-10-CM | POA: Diagnosis not present

## 2022-10-29 DIAGNOSIS — Z955 Presence of coronary angioplasty implant and graft: Secondary | ICD-10-CM | POA: Diagnosis not present

## 2022-10-29 DIAGNOSIS — K219 Gastro-esophageal reflux disease without esophagitis: Secondary | ICD-10-CM | POA: Diagnosis not present

## 2022-10-29 DIAGNOSIS — D631 Anemia in chronic kidney disease: Secondary | ICD-10-CM | POA: Diagnosis not present

## 2022-10-29 DIAGNOSIS — Z950 Presence of cardiac pacemaker: Secondary | ICD-10-CM | POA: Diagnosis not present

## 2022-10-29 DIAGNOSIS — J9602 Acute respiratory failure with hypercapnia: Secondary | ICD-10-CM | POA: Diagnosis not present

## 2022-10-29 DIAGNOSIS — G2581 Restless legs syndrome: Secondary | ICD-10-CM | POA: Diagnosis not present

## 2022-10-29 DIAGNOSIS — Z952 Presence of prosthetic heart valve: Secondary | ICD-10-CM | POA: Diagnosis not present

## 2022-10-29 DIAGNOSIS — I130 Hypertensive heart and chronic kidney disease with heart failure and stage 1 through stage 4 chronic kidney disease, or unspecified chronic kidney disease: Secondary | ICD-10-CM | POA: Diagnosis not present

## 2022-10-29 DIAGNOSIS — N184 Chronic kidney disease, stage 4 (severe): Secondary | ICD-10-CM | POA: Diagnosis not present

## 2022-11-10 DIAGNOSIS — I5020 Unspecified systolic (congestive) heart failure: Secondary | ICD-10-CM | POA: Diagnosis not present

## 2022-11-10 DIAGNOSIS — Z955 Presence of coronary angioplasty implant and graft: Secondary | ICD-10-CM | POA: Diagnosis not present

## 2022-11-10 DIAGNOSIS — Z952 Presence of prosthetic heart valve: Secondary | ICD-10-CM | POA: Diagnosis not present

## 2022-11-16 DIAGNOSIS — I214 Non-ST elevation (NSTEMI) myocardial infarction: Secondary | ICD-10-CM | POA: Diagnosis not present

## 2022-11-23 DIAGNOSIS — Z45018 Encounter for adjustment and management of other part of cardiac pacemaker: Secondary | ICD-10-CM | POA: Diagnosis not present

## 2022-11-23 DIAGNOSIS — Z955 Presence of coronary angioplasty implant and graft: Secondary | ICD-10-CM | POA: Diagnosis not present

## 2022-11-23 DIAGNOSIS — Z952 Presence of prosthetic heart valve: Secondary | ICD-10-CM | POA: Diagnosis not present

## 2022-11-23 DIAGNOSIS — I4819 Other persistent atrial fibrillation: Secondary | ICD-10-CM | POA: Diagnosis not present

## 2022-12-08 ENCOUNTER — Ambulatory Visit (INDEPENDENT_AMBULATORY_CARE_PROVIDER_SITE_OTHER): Payer: Medicare Other | Admitting: Vascular Surgery

## 2022-12-08 ENCOUNTER — Ambulatory Visit (INDEPENDENT_AMBULATORY_CARE_PROVIDER_SITE_OTHER): Payer: Medicare Other

## 2022-12-08 VITALS — BP 117/76 | HR 69 | Resp 18

## 2022-12-08 DIAGNOSIS — I739 Peripheral vascular disease, unspecified: Secondary | ICD-10-CM

## 2022-12-08 DIAGNOSIS — E785 Hyperlipidemia, unspecified: Secondary | ICD-10-CM | POA: Diagnosis not present

## 2022-12-08 DIAGNOSIS — Z9889 Other specified postprocedural states: Secondary | ICD-10-CM | POA: Diagnosis not present

## 2022-12-08 DIAGNOSIS — I70202 Unspecified atherosclerosis of native arteries of extremities, left leg: Secondary | ICD-10-CM

## 2022-12-08 DIAGNOSIS — I25119 Atherosclerotic heart disease of native coronary artery with unspecified angina pectoris: Secondary | ICD-10-CM

## 2022-12-08 LAB — VAS US ABI WITH/WO TBI
Left ABI: 1.13
Right ABI: 1.19

## 2022-12-08 NOTE — Progress Notes (Signed)
MRN : [id number]  Rebecca Gilmore is a 82 y.o. (03/27/1940) female who presents with chief complaint of  Chief Complaint  Patient presents with   Venous Insufficiency  .  History of Present Illness: Patient returns today in follow up of her PAD.  She is about 1 year status post left iliofemoral endarterectomy for ischemia of the left lower extremity.  She continues to have a lot of weakness and difficulty walking particularly in the left leg.  She does have hip and back issues as well.  No new ulceration or infection.  Stable mild swelling in both lower extremities.  ABIs today are stable at 1.19 on the right and 1.13 on the left with triphasic waveforms.  Current Outpatient Medications  Medication Sig Dispense Refill   acetaminophen (TYLENOL) 325 MG tablet Take 2 tablets (650 mg total) by mouth every 6 (six) hours as needed for mild pain or fever.     amitriptyline (ELAVIL) 25 MG tablet Take 25 mg by mouth at bedtime as needed.     cholecalciferol (VITAMIN D3) 25 MCG (1000 UT) tablet Take 1,000 Units by mouth daily.     clopidogrel (PLAVIX) 75 MG tablet TAKE 1 TABLET BY MOUTH EVERY DAY 90 tablet 2   cyanocobalamin (VITAMIN B12) 1000 MCG tablet Take 1,000 mcg by mouth daily.     empagliflozin (JARDIANCE) 10 MG TABS tablet Take 1 tablet by mouth daily.     Ferrous Sulfate (IRON) 325 (65 Fe) MG TABS Take 1 tablet by mouth daily.     loratadine (CLARITIN) 10 MG tablet Take 10 mg by mouth daily as needed.     pantoprazole (PROTONIX) 40 MG tablet Take 40 mg by mouth at bedtime.     potassium chloride (MICRO-K) 10 MEQ CR capsule Take by mouth.     pregabalin (LYRICA) 75 MG capsule Take 1 capsule (75 mg total) by mouth daily. 30 capsule 0   rosuvastatin (CRESTOR) 5 MG tablet Take by mouth.     sertraline (ZOLOFT) 50 MG tablet Take 50 mg by mouth daily.  0   torsemide (DEMADEX) 100 MG tablet Take by mouth.     aspirin 81 MG chewable tablet Chew 1 tablet (81 mg total) by mouth daily. (Patient not  taking: Reported on 12/08/2022)     No current facility-administered medications for this visit.    Past Medical History:  Diagnosis Date   Anemia    Arrhythmia    atrial fibrillation   CHF (congestive heart failure) (HCC)    Chronic kidney disease    Depression    HBP (high blood pressure)    History of bladder problems    Memory loss    Muscle pain    Osteoporosis    Reflux    Sinus congestion    Swelling    B/L FEET AND LEGS    Past Surgical History:  Procedure Laterality Date   ARTERY BIOPSY Bilateral 12/01/2016   Procedure: BIOPSY TEMPORAL ARTERY;  Surgeon: Fransisco Hertz, MD;  Location: [facility] OR;  Service: Vascular;  Laterality: Bilateral;   CATARACT SURGERY     COLONOSCOPY WITH PROPOFOL N/A 12/13/2021   Procedure: COLONOSCOPY WITH PROPOFOL;  Surgeon: Wyline Mood, MD;  Location: [facility] ENDOSCOPY;  Service: Gastroenterology;  Laterality: N/A;   CORONARY/GRAFT ACUTE MI REVASCULARIZATION N/A 08/05/2022   Procedure: Coronary/Graft Acute MI Revascularization;  Surgeon: Marcina Millard, MD;  Location: [facility] INVASIVE CV LAB;  Service: Cardiovascular;  Laterality: N/A;   ESOPHAGOGASTRODUODENOSCOPY (EGD) WITH PROPOFOL  N/A 12/12/2021   Procedure: ESOPHAGOGASTRODUODENOSCOPY (EGD) WITH PROPOFOL;  Surgeon: Jaynie Collins, DO;  Location: [facility] ENDOSCOPY;  Service: Gastroenterology;  Laterality: N/A;   EYELID SURGERY Bilateral    GIVENS CAPSULE STUDY N/A 12/15/2021   Procedure: GIVENS CAPSULE STUDY;  Surgeon: Jaynie Collins, DO;  Location: [facility] ENDOSCOPY;  Service: Gastroenterology;  Laterality: N/A;   GIVENS CAPSULE STUDY N/A 01/28/2022   Procedure: GIVENS CAPSULE STUDY;  Surgeon: Toney Reil, MD;  Location: [facility] ENDOSCOPY;  Service: Gastroenterology;  Laterality: N/A;   GROWTH ON FACE     KNEE SURGERY Right    LEFT HEART CATH AND CORONARY ANGIOGRAPHY N/A 08/05/2022   Procedure: LEFT HEART CATH AND CORONARY ANGIOGRAPHY;  Surgeon: Marcina Millard, MD;  Location:  [facility] INVASIVE CV LAB;  Service: Cardiovascular;  Laterality: N/A;   THROMBECTOMY FEMORAL ARTERY Left 11/16/2021   Procedure: THROMBECTOMY FEMORAL ARTERY;  Surgeon: Fransisco Hertz, MD;  Location: [facility] ORS;  Service: Vascular;  Laterality: Left;     Social History   Tobacco Use   Smoking status: Former    Current packs/day: 0.00    Types: Cigarettes    Quit date: 01/12/1993    Years since quitting: 29.9   Smokeless tobacco: Former    Types: Snuff, Chew   Tobacco comments:    DATE QUIT UNKNOWN  Vaping Use   Vaping status: Never Used  Substance Use Topics   Alcohol use: No   Drug use: No      Family History  Problem Relation Age of Onset   Heart disease Mother    AAA (abdominal aortic aneurysm) Mother    Heart disease Father      Allergies  Allergen Reactions   Chlorhexidine Rash   Morphine Hives and Itching    Other Reaction(s): Confusion   Other Hives, Itching and Other (See Comments)    Other Reaction(s): Confusion   Morphine And Codeine Itching   Kenalog [Triamcinolone Acetonide] Other (See Comments)    FLUSH IN FACE   Penicillins Other (See Comments)    Has patient had a PCN reaction causing immediate rash, facial/tongue/throat swelling, SOB or lightheadedness with hypotension: Unknown Has patient had a PCN reaction causing severe rash involving mucus membranes or skin necrosis: Unknown Has patient had a PCN reaction that required hospitalization:No Has patient had a PCN reaction occurring within the last 10 years: No If all of the above answers are "NO", then may proceed with Cephalosporin use.    Latex Rash   Povidone-Iodine Rash    burning     REVIEW OF SYSTEMS (Negative unless checked)   Constitutional: [] Weight loss  [] Fever  [] Chills Cardiac: [] Chest pain   [] Chest pressure   [x] Palpitations   [] Shortness of breath when laying flat   [] Shortness of breath at rest   [] Shortness of breath with exertion. Vascular:  [] Pain in legs with walking   [] Pain in  legs at rest   [] Pain in legs when laying flat   [] Claudication   [] Pain in feet when walking  [] Pain in feet at rest  [] Pain in feet when laying flat   [] History of DVT   [] Phlebitis   [] Swelling in legs   [] Varicose veins   [] Non-healing ulcers Pulmonary:   [] Uses home oxygen   [] Productive cough   [] Hemoptysis   [] Wheeze  [] COPD   [] Asthma Neurologic:  [] Dizziness  [] Blackouts   [] Seizures   [] History of stroke   [] History of TIA  [] Aphasia   [] Temporary blindness   [] Dysphagia   []   Weakness or numbness in arms   [x] Weakness or numbness in legs Musculoskeletal:  [x] Arthritis   [] Joint swelling   [x] Joint pain   [] Low back pain Hematologic:  [] Easy bruising  [] Easy bleeding   [] Hypercoagulable state   [] Anemic   Gastrointestinal:  [] Blood in stool   [] Vomiting blood  [x] Gastroesophageal reflux/heartburn   [] Abdominal pain Genitourinary:  [] Chronic kidney disease   [] Difficult urination  [] Frequent urination  [] Burning with urination   [] Hematuria Skin:  [] Rashes   [] Ulcers   [] Wounds Psychological:  [] History of anxiety   [x]  History of major depression.  Physical Examination  BP 117/76 (BP Location: Left Arm)   Pulse 69   Resp 18  Gen:  WD/WN, NAD. Appears younger than stated age. Head: [place]/AT, No temporalis wasting. Ear/Nose/Throat: Hearing grossly intact, nares w/o erythema or drainage Eyes: Conjunctiva clear. Sclera non-icteric Neck: Supple.  Trachea midline Pulmonary:  Good air movement, no use of accessory muscles.  Cardiac: RRR, no JVD Vascular:  Vessel Right Left  Radial Palpable Palpable                          PT Palpable Palpable  DP Palpable Palpable   Gastrointestinal: soft, non-tender/non-distended. No guarding/reflex.  Musculoskeletal: M/S 5/5 throughout.  No deformity or atrophy. Mild BLE edema. Neurologic: Sensation grossly intact in extremities.  Symmetrical.  Speech is fluent.  Psychiatric: Judgment intact, Mood & affect appropriate for pt's clinical  situation. Dermatologic: No rashes or ulcers noted.  No cellulitis or open wounds.      Labs Recent Results (from the past 2160 hour(s))  CBC     Status: None   Collection Time: 10/16/22  8:26 PM  Result Value Ref Range   WBC 6.6 4.0 - 10.5 K/uL   RBC 4.21 3.87 - 5.11 MIL/uL   Hemoglobin 12.0 12.0 - 15.0 g/dL   HCT 16.1 09.6 - 04.5 %   MCV 88.4 80.0 - 100.0 fL   MCH 28.5 26.0 - 34.0 pg   MCHC 32.3 30.0 - 36.0 g/dL   RDW 40.9 81.1 - 91.4 %   Platelets 172 150 - 400 K/uL   nRBC 0.0 0.0 - 0.2 %    Comment: Performed at [facility], [address] [place], [place] [address]  Comprehensive metabolic panel     Status: Abnormal   Collection Time: 10/16/22  8:26 PM  Result Value Ref Range   Sodium 135 135 - 145 mmol/L   Potassium 3.0 (L) 3.5 - 5.1 mmol/L   Chloride 94 (L) 98 - 111 mmol/L   CO2 29 22 - 32 mmol/L   Glucose, Bld 106 (H) 70 - 99 mg/dL    Comment: Glucose reference range applies only to samples taken after fasting for at least 8 hours.   BUN 38 (H) 8 - 23 mg/dL   Creatinine, Ser 6.21 (H) 0.44 - 1.00 mg/dL   Calcium 9.5 8.9 - 30.8 mg/dL   Total Protein 8.2 (H) 6.5 - 8.1 g/dL   Albumin 3.7 3.5 - 5.0 g/dL   AST 26 15 - 41 U/L   ALT 10 0 - 44 U/L   Alkaline Phosphatase 75 38 - 126 U/L   Total Bilirubin 1.2 0.3 - 1.2 mg/dL   GFR, Estimated 21 (L) >60 mL/min    Comment: (NOTE) Calculated using the CKD-EPI Creatinine Equation (2021)    Anion gap 12 5 - 15    Comment: Performed at [facility], [address] [place], [place] [address]    Radiology No results found.  Assessment/Plan  HLD (hyperlipidemia) lipid control important in reducing the progression of atherosclerotic disease. Continue statin therapy   Femoral artery occlusion, left (HCC) ABIs today are stable at 1.19 on the right and 1.13 on the left with triphasic waveforms.  Perfusion is normal after revascularization 1 year ago.  Still with limitations that are likely more  neurogenic or musculoskeletal in nature.  Continue current medications.  Follow-up in 6 months with noninvasive studies.    Festus Barren, MD  12/08/2022 12:19 PM    This note was created with Dragon medical transcription system.  Any errors from dictation are purely unintentional

## 2022-12-08 NOTE — Assessment & Plan Note (Signed)
ABIs today are stable at 1.19 on the right and 1.13 on the left with triphasic waveforms.  Perfusion is normal after revascularization 1 year ago.  Still with limitations that are likely more neurogenic or musculoskeletal in nature.  Continue current medications.  Follow-up in 6 months with noninvasive studies.

## 2022-12-08 NOTE — Assessment & Plan Note (Signed)
lipid control important in reducing the progression of atherosclerotic disease. Continue statin therapy  

## 2022-12-16 DIAGNOSIS — I214 Non-ST elevation (NSTEMI) myocardial infarction: Secondary | ICD-10-CM | POA: Diagnosis not present

## 2022-12-24 ENCOUNTER — Emergency Department: Payer: Medicare Other

## 2022-12-24 ENCOUNTER — Inpatient Hospital Stay
Admission: EM | Admit: 2022-12-24 | Discharge: 2022-12-28 | DRG: 871 | Disposition: A | Discharge status: Home Health | Payer: Medicare Other | Attending: Internal Medicine | Admitting: Internal Medicine

## 2022-12-24 ENCOUNTER — Encounter: Payer: Self-pay | Admitting: Internal Medicine

## 2022-12-24 ENCOUNTER — Other Ambulatory Visit: Payer: Self-pay

## 2022-12-24 DIAGNOSIS — Z7984 Long term (current) use of oral hypoglycemic drugs: Secondary | ICD-10-CM | POA: Diagnosis not present

## 2022-12-24 DIAGNOSIS — I130 Hypertensive heart and chronic kidney disease with heart failure and stage 1 through stage 4 chronic kidney disease, or unspecified chronic kidney disease: Secondary | ICD-10-CM | POA: Diagnosis present

## 2022-12-24 DIAGNOSIS — Z6836 Body mass index (BMI) 36.0-36.9, adult: Secondary | ICD-10-CM | POA: Diagnosis not present

## 2022-12-24 DIAGNOSIS — M160 Bilateral primary osteoarthritis of hip: Secondary | ICD-10-CM | POA: Diagnosis not present

## 2022-12-24 DIAGNOSIS — E1151 Type 2 diabetes mellitus with diabetic peripheral angiopathy without gangrene: Secondary | ICD-10-CM | POA: Diagnosis not present

## 2022-12-24 DIAGNOSIS — R6520 Severe sepsis without septic shock: Secondary | ICD-10-CM | POA: Diagnosis not present

## 2022-12-24 DIAGNOSIS — E872 Acidosis, unspecified: Secondary | ICD-10-CM | POA: Diagnosis not present

## 2022-12-24 DIAGNOSIS — I499 Cardiac arrhythmia, unspecified: Secondary | ICD-10-CM | POA: Diagnosis not present

## 2022-12-24 DIAGNOSIS — Z885 Allergy status to narcotic agent status: Secondary | ICD-10-CM

## 2022-12-24 DIAGNOSIS — E785 Hyperlipidemia, unspecified: Secondary | ICD-10-CM | POA: Diagnosis not present

## 2022-12-24 DIAGNOSIS — Z91040 Latex allergy status: Secondary | ICD-10-CM

## 2022-12-24 DIAGNOSIS — N3000 Acute cystitis without hematuria: Secondary | ICD-10-CM | POA: Diagnosis not present

## 2022-12-24 DIAGNOSIS — Z66 Do not resuscitate: Secondary | ICD-10-CM | POA: Diagnosis not present

## 2022-12-24 DIAGNOSIS — R509 Fever, unspecified: Secondary | ICD-10-CM | POA: Diagnosis not present

## 2022-12-24 DIAGNOSIS — G9341 Metabolic encephalopathy: Secondary | ICD-10-CM | POA: Diagnosis present

## 2022-12-24 DIAGNOSIS — D519 Vitamin B12 deficiency anemia, unspecified: Secondary | ICD-10-CM | POA: Diagnosis present

## 2022-12-24 DIAGNOSIS — I959 Hypotension, unspecified: Secondary | ICD-10-CM | POA: Diagnosis present

## 2022-12-24 DIAGNOSIS — Z7902 Long term (current) use of antithrombotics/antiplatelets: Secondary | ICD-10-CM

## 2022-12-24 DIAGNOSIS — E871 Hypo-osmolality and hyponatremia: Secondary | ICD-10-CM | POA: Diagnosis present

## 2022-12-24 DIAGNOSIS — D631 Anemia in chronic kidney disease: Secondary | ICD-10-CM | POA: Diagnosis present

## 2022-12-24 DIAGNOSIS — A419 Sepsis, unspecified organism: Secondary | ICD-10-CM | POA: Diagnosis not present

## 2022-12-24 DIAGNOSIS — E1122 Type 2 diabetes mellitus with diabetic chronic kidney disease: Secondary | ICD-10-CM | POA: Diagnosis present

## 2022-12-24 DIAGNOSIS — I2510 Atherosclerotic heart disease of native coronary artery without angina pectoris: Secondary | ICD-10-CM | POA: Diagnosis present

## 2022-12-24 DIAGNOSIS — Z1152 Encounter for screening for COVID-19: Secondary | ICD-10-CM

## 2022-12-24 DIAGNOSIS — D72829 Elevated white blood cell count, unspecified: Secondary | ICD-10-CM | POA: Insufficient documentation

## 2022-12-24 DIAGNOSIS — N1832 Chronic kidney disease, stage 3b: Secondary | ICD-10-CM | POA: Diagnosis not present

## 2022-12-24 DIAGNOSIS — R6889 Other general symptoms and signs: Secondary | ICD-10-CM | POA: Diagnosis not present

## 2022-12-24 DIAGNOSIS — Z880 Allergy status to penicillin: Secondary | ICD-10-CM

## 2022-12-24 DIAGNOSIS — A4151 Sepsis due to Escherichia coli [E. coli]: Principal | ICD-10-CM | POA: Diagnosis present

## 2022-12-24 DIAGNOSIS — R296 Repeated falls: Secondary | ICD-10-CM | POA: Diagnosis not present

## 2022-12-24 DIAGNOSIS — M799 Soft tissue disorder, unspecified: Secondary | ICD-10-CM | POA: Diagnosis not present

## 2022-12-24 DIAGNOSIS — N2889 Other specified disorders of kidney and ureter: Secondary | ICD-10-CM | POA: Diagnosis not present

## 2022-12-24 DIAGNOSIS — K429 Umbilical hernia without obstruction or gangrene: Secondary | ICD-10-CM | POA: Diagnosis not present

## 2022-12-24 DIAGNOSIS — N179 Acute kidney failure, unspecified: Secondary | ICD-10-CM | POA: Diagnosis not present

## 2022-12-24 DIAGNOSIS — Z950 Presence of cardiac pacemaker: Secondary | ICD-10-CM | POA: Diagnosis not present

## 2022-12-24 DIAGNOSIS — F32A Depression, unspecified: Secondary | ICD-10-CM | POA: Diagnosis present

## 2022-12-24 DIAGNOSIS — D509 Iron deficiency anemia, unspecified: Secondary | ICD-10-CM | POA: Diagnosis present

## 2022-12-24 DIAGNOSIS — M25522 Pain in left elbow: Secondary | ICD-10-CM | POA: Diagnosis not present

## 2022-12-24 DIAGNOSIS — R0989 Other specified symptoms and signs involving the circulatory and respiratory systems: Secondary | ICD-10-CM | POA: Diagnosis not present

## 2022-12-24 DIAGNOSIS — I5032 Chronic diastolic (congestive) heart failure: Secondary | ICD-10-CM | POA: Diagnosis present

## 2022-12-24 DIAGNOSIS — Z79899 Other long term (current) drug therapy: Secondary | ICD-10-CM

## 2022-12-24 DIAGNOSIS — Z91041 Radiographic dye allergy status: Secondary | ICD-10-CM

## 2022-12-24 DIAGNOSIS — Z888 Allergy status to other drugs, medicaments and biological substances status: Secondary | ICD-10-CM

## 2022-12-24 DIAGNOSIS — M25552 Pain in left hip: Secondary | ICD-10-CM | POA: Diagnosis not present

## 2022-12-24 DIAGNOSIS — M19012 Primary osteoarthritis, left shoulder: Secondary | ICD-10-CM | POA: Diagnosis not present

## 2022-12-24 DIAGNOSIS — M19022 Primary osteoarthritis, left elbow: Secondary | ICD-10-CM | POA: Diagnosis present

## 2022-12-24 DIAGNOSIS — M25512 Pain in left shoulder: Secondary | ICD-10-CM | POA: Diagnosis not present

## 2022-12-24 DIAGNOSIS — R531 Weakness: Secondary | ICD-10-CM | POA: Diagnosis not present

## 2022-12-24 DIAGNOSIS — I672 Cerebral atherosclerosis: Secondary | ICD-10-CM | POA: Diagnosis not present

## 2022-12-24 DIAGNOSIS — R0689 Other abnormalities of breathing: Secondary | ICD-10-CM | POA: Diagnosis not present

## 2022-12-24 DIAGNOSIS — Z8249 Family history of ischemic heart disease and other diseases of the circulatory system: Secondary | ICD-10-CM

## 2022-12-24 DIAGNOSIS — M79605 Pain in left leg: Secondary | ICD-10-CM | POA: Insufficient documentation

## 2022-12-24 DIAGNOSIS — R000 Tachycardia, unspecified: Secondary | ICD-10-CM | POA: Diagnosis not present

## 2022-12-24 DIAGNOSIS — I252 Old myocardial infarction: Secondary | ICD-10-CM

## 2022-12-24 LAB — CBC WITH DIFFERENTIAL/PLATELET
Abs Immature Granulocytes: 0.25 10*3/uL — ABNORMAL HIGH (ref 0.00–0.07)
Basophils Absolute: 0.1 10*3/uL (ref 0.0–0.1)
Basophils Relative: 0 %
Eosinophils Absolute: 0 10*3/uL (ref 0.0–0.5)
Eosinophils Relative: 0 %
HCT: 32 % — ABNORMAL LOW (ref 36.0–46.0)
Hemoglobin: 10.3 g/dL — ABNORMAL LOW (ref 12.0–15.0)
Immature Granulocytes: 1 %
Lymphocytes Relative: 5 %
Lymphs Abs: 1.1 10*3/uL (ref 0.7–4.0)
MCH: 28.7 pg (ref 26.0–34.0)
MCHC: 32.2 g/dL (ref 30.0–36.0)
MCV: 89.1 fL (ref 80.0–100.0)
Monocytes Absolute: 1.1 10*3/uL — ABNORMAL HIGH (ref 0.1–1.0)
Monocytes Relative: 5 %
Neutro Abs: 21 10*3/uL — ABNORMAL HIGH (ref 1.7–7.7)
Neutrophils Relative %: 89 %
Platelets: 228 10*3/uL (ref 150–400)
RBC: 3.59 MIL/uL — ABNORMAL LOW (ref 3.87–5.11)
RDW: 13.7 % (ref 11.5–15.5)
WBC: 23.5 10*3/uL — ABNORMAL HIGH (ref 4.0–10.5)
nRBC: 0 % (ref 0.0–0.2)

## 2022-12-24 LAB — BLOOD CULTURE ID PANEL (REFLEXED) - BCID2

## 2022-12-24 LAB — RESP PANEL BY RT-PCR (RSV, FLU A&B, COVID)  RVPGX2
Influenza A by PCR: NEGATIVE
Influenza B by PCR: NEGATIVE
Resp Syncytial Virus by PCR: NEGATIVE
SARS Coronavirus 2 by RT PCR: NEGATIVE

## 2022-12-24 LAB — COMPREHENSIVE METABOLIC PANEL
ALT: 10 U/L (ref 0–44)
AST: 19 U/L (ref 15–41)
Albumin: 3.3 g/dL — ABNORMAL LOW (ref 3.5–5.0)
Alkaline Phosphatase: 61 U/L (ref 38–126)
Anion gap: 10 (ref 5–15)
BUN: 33 mg/dL — ABNORMAL HIGH (ref 8–23)
CO2: 21 mmol/L — ABNORMAL LOW (ref 22–32)
Calcium: 8.7 mg/dL — ABNORMAL LOW (ref 8.9–10.3)
Chloride: 100 mmol/L (ref 98–111)
Creatinine, Ser: 2.48 mg/dL — ABNORMAL HIGH (ref 0.44–1.00)
GFR, Estimated: 19 mL/min — ABNORMAL LOW (ref >60)
Glucose, Bld: 105 mg/dL — ABNORMAL HIGH (ref 70–99)
Potassium: 3.9 mmol/L (ref 3.5–5.1)
Sodium: 131 mmol/L — ABNORMAL LOW (ref 135–145)
Total Bilirubin: 1.4 mg/dL — ABNORMAL HIGH (ref 0.3–1.2)
Total Protein: 7.6 g/dL (ref 6.5–8.1)

## 2022-12-24 LAB — PROTIME-INR
INR: 1.4 — ABNORMAL HIGH (ref 0.8–1.2)
Prothrombin Time: 17.1 s — ABNORMAL HIGH (ref 11.4–15.2)

## 2022-12-24 LAB — URINALYSIS, W/ REFLEX TO CULTURE (INFECTION SUSPECTED)
Bilirubin Urine: NEGATIVE
Glucose, UA: >=500 mg/dL — AB
Ketones, ur: NEGATIVE mg/dL
Nitrite: NEGATIVE
Protein, ur: 30 mg/dL — AB
Specific Gravity, Urine: 1.013 (ref 1.005–1.030)
pH: 5 (ref 5.0–8.0)

## 2022-12-24 LAB — GLUCOSE, CAPILLARY: Glucose-Capillary: 111 mg/dL — ABNORMAL HIGH (ref 70–99)

## 2022-12-24 LAB — LACTIC ACID, PLASMA
Lactic Acid, Venous: 1.6 mmol/L (ref 0.5–1.9)
Lactic Acid, Venous: 1.2 mmol/L (ref 0.5–1.9)

## 2022-12-24 LAB — PROCALCITONIN: Procalcitonin: 21.1 ng/mL

## 2022-12-24 MED ORDER — OXYCODONE HCL 5 MG PO TABS: 5.0000 mg | ORAL_TABLET | Freq: Four times a day (QID) | ORAL | Status: AC | PRN

## 2022-12-24 MED ORDER — PANTOPRAZOLE SODIUM 40 MG PO TBEC
40.0000 mg | DELAYED_RELEASE_TABLET | Freq: Every day | ORAL | Status: DC
  Administered 2022-12-24 – 2022-12-27 (×4): 40 mg via ORAL
  Filled 2022-12-24 (×4): qty 1

## 2022-12-24 MED ORDER — ROSUVASTATIN CALCIUM 10 MG PO TABS
5.0000 mg | ORAL_TABLET | Freq: Every day | ORAL | Status: DC
  Administered 2022-12-24 – 2022-12-27 (×4): 5 mg via ORAL
  Filled 2022-12-24 (×5): qty 1

## 2022-12-24 MED ORDER — SODIUM CHLORIDE 0.9 % IV SOLN: INTRAVENOUS | Status: DC

## 2022-12-24 MED ORDER — LIDOCAINE 5 % EX PTCH
2.0000 | MEDICATED_PATCH | CUTANEOUS | Status: AC
  Administered 2022-12-24 – 2022-12-26 (×3): 2 via TRANSDERMAL
  Filled 2022-12-24 (×4): qty 2

## 2022-12-24 MED ORDER — PREGABALIN 75 MG PO CAPS
75.0000 mg | ORAL_CAPSULE | Freq: Every day | ORAL | Status: DC
  Administered 2022-12-24 – 2022-12-28 (×5): 75 mg via ORAL
  Filled 2022-12-24 (×5): qty 1

## 2022-12-24 MED ORDER — AMITRIPTYLINE HCL 25 MG PO TABS
25.0000 mg | ORAL_TABLET | Freq: Every evening | ORAL | Status: DC | PRN
  Administered 2022-12-25 – 2022-12-27 (×3): 25 mg via ORAL
  Filled 2022-12-24 (×5): qty 1

## 2022-12-24 MED ORDER — CLOPIDOGREL BISULFATE 75 MG PO TABS
75.0000 mg | ORAL_TABLET | Freq: Every day | ORAL | Status: DC
  Administered 2022-12-24 – 2022-12-28 (×5): 75 mg via ORAL
  Filled 2022-12-24 (×5): qty 1

## 2022-12-24 MED ORDER — SERTRALINE HCL 50 MG PO TABS
50.0000 mg | ORAL_TABLET | Freq: Every day | ORAL | Status: DC
  Administered 2022-12-24 – 2022-12-28 (×5): 50 mg via ORAL
  Filled 2022-12-24 (×5): qty 1

## 2022-12-24 MED ORDER — FERROUS SULFATE 325 (65 FE) MG PO TABS
325.0000 mg | ORAL_TABLET | Freq: Every day | ORAL | Status: DC
  Administered 2022-12-24 – 2022-12-28 (×5): 325 mg via ORAL
  Filled 2022-12-24 (×5): qty 1

## 2022-12-24 MED ORDER — SODIUM CHLORIDE 0.9 % IV SOLN
2.0000 g | INTRAVENOUS | Status: DC
  Administered 2022-12-24: 2 g via INTRAVENOUS
  Filled 2022-12-24 (×2): qty 12.5

## 2022-12-24 MED ORDER — ACETAMINOPHEN 325 MG PO TABS
650.0000 mg | ORAL_TABLET | Freq: Four times a day (QID) | ORAL | Status: DC | PRN
  Administered 2022-12-25 – 2022-12-28 (×6): 650 mg via ORAL
  Filled 2022-12-24 (×6): qty 2

## 2022-12-24 MED ORDER — SODIUM CHLORIDE 0.9 % IV SOLN
2.0000 g | INTRAVENOUS | Status: DC
  Administered 2022-12-24: 2 g via INTRAVENOUS
  Filled 2022-12-24: qty 20

## 2022-12-24 MED ORDER — LACTATED RINGERS IV BOLUS (SEPSIS)
1000.0000 mL | Freq: Once | INTRAVENOUS | Status: AC
  Administered 2022-12-24: 1000 mL via INTRAVENOUS

## 2022-12-24 MED ORDER — HEPARIN SODIUM (PORCINE) 5000 UNIT/ML IJ SOLN
5000.0000 [IU] | Freq: Three times a day (TID) | INTRAMUSCULAR | Status: DC
  Administered 2022-12-24 – 2022-12-28 (×11): 5000 [IU] via SUBCUTANEOUS
  Filled 2022-12-24 (×9): qty 1

## 2022-12-24 MED ORDER — ONDANSETRON HCL 4 MG/2ML IJ SOLN: 4.0000 mg | Freq: Four times a day (QID) | INTRAMUSCULAR | Status: DC | PRN

## 2022-12-24 MED ORDER — ORAL CARE MOUTH RINSE: 15.0000 mL | OROMUCOSAL | Status: DC | PRN

## 2022-12-24 MED ORDER — VITAMIN B-12 1000 MCG PO TABS
1000.0000 ug | ORAL_TABLET | Freq: Every day | ORAL | Status: DC
  Administered 2022-12-24 – 2022-12-28 (×5): 1000 ug via ORAL
  Filled 2022-12-24: qty 2
  Filled 2022-12-24 (×4): qty 1

## 2022-12-24 MED ORDER — ACETAMINOPHEN 10 MG/ML IV SOLN
1000.0000 mg | Freq: Once | INTRAVENOUS | Status: AC
  Administered 2022-12-24: 1000 mg via INTRAVENOUS
  Filled 2022-12-24: qty 100

## 2022-12-24 MED ORDER — LACTATED RINGERS IV BOLUS (SEPSIS): 1000.0000 mL | Freq: Once | INTRAVENOUS | Status: DC

## 2022-12-24 MED ORDER — ONDANSETRON HCL 4 MG PO TABS: 4.0000 mg | ORAL_TABLET | Freq: Four times a day (QID) | ORAL | Status: DC | PRN

## 2022-12-24 MED ORDER — ACETAMINOPHEN 650 MG RE SUPP: 650.0000 mg | Freq: Four times a day (QID) | RECTAL | Status: DC | PRN

## 2022-12-24 MED ORDER — VANCOMYCIN HCL 2000 MG/400ML IV SOLN
2000.0000 mg | Freq: Once | INTRAVENOUS | Status: AC
  Administered 2022-12-24: 2000 mg via INTRAVENOUS
  Filled 2022-12-24: qty 400

## 2022-12-24 MED ORDER — VITAMIN D 25 MCG (1000 UNIT) PO TABS
1000.0000 [IU] | ORAL_TABLET | Freq: Every day | ORAL | Status: DC
  Administered 2022-12-24 – 2022-12-28 (×5): 1000 [IU] via ORAL
  Filled 2022-12-24 (×5): qty 1

## 2022-12-24 MED ORDER — VANCOMYCIN HCL 750 MG/150ML IV SOLN: 750.0000 mg | INTRAVENOUS | Status: DC

## 2022-12-24 MED ORDER — METRONIDAZOLE 500 MG/100ML IV SOLN
500.0000 mg | Freq: Two times a day (BID) | INTRAVENOUS | Status: DC
  Administered 2022-12-24 – 2022-12-25 (×2): 500 mg via INTRAVENOUS
  Filled 2022-12-24 (×2): qty 100

## 2022-12-24 NOTE — Assessment & Plan Note (Signed)
Baseline hemoglobin is 7.3-9.0 Resume home iron supplementation

## 2022-12-24 NOTE — Progress Notes (Signed)
Pharmacy Antibiotic Note  Rebecca Gilmore is a 82 y.o. female admitted on 12/24/2022 with  Unknown source of infection .  Pharmacy has been consulted for Cefepime and Vancomycin dosing.  Plan: Vancomycin 2000 mg IV loading dose followed by Vancomycin 750 mg IV Q 48 hrs. Goal AUC 400-550. Expected AUC: 463.7 SCr used: 2.48 (does have CKD per notes from office visits, SCr was 2.24 on 8/9) Expected Cmin: 12.7  Cefepime 2g IV q24h  Metronidazole 500mg  IV q12h  Follow SCr and adjust antibiotics accordingly   Height: 5\' 4"  (162.6 cm) Weight: 95.5 kg (210 lb 8.6 oz) IBW/kg (Calculated) : 54.7  Temp (24hrs), Avg:98.1 F (36.7 C), Min:98.1 F (36.7 C), Max:98.1 F (36.7 C)  Recent Labs  Lab 12/24/22 0917  WBC 23.5*  CREATININE 2.48*  LATICACIDVEN 1.6    Estimated Creatinine Clearance: 19.6 mL/min (A) (by C-G formula based on SCr of 2.48 mg/dL (H)).    Allergies  Allergen Reactions   Chlorhexidine Rash   Morphine Hives and Itching    Other Reaction(s): Confusion   Other Hives, Itching and Other (See Comments)    Other Reaction(s): Confusion   Morphine And Codeine Itching   Kenalog [Triamcinolone Acetonide] Other (See Comments)    FLUSH IN FACE   Penicillins Other (See Comments)    Has patient had a PCN reaction causing immediate rash, facial/tongue/throat swelling, SOB or lightheadedness with hypotension: Unknown Has patient had a PCN reaction causing severe rash involving mucus membranes or skin necrosis: Unknown Has patient had a PCN reaction that required hospitalization:No Has patient had a PCN reaction occurring within the last 10 years: No If all of the above answers are "NO", then may proceed with Cephalosporin use.    Latex Rash   Povidone-Iodine Rash    burning    Antimicrobials this admission: Ceftriaxone 10/17 x 1 Cefepime 10/17 >>  Vancomycin 10/17 >> Metronidazole 10/17 >>  Dose adjustments this admission:  Microbiology results: 10/17 BCx:  pending 10/17 UCx: pending   Thank you for allowing pharmacy to be a part of this patient's care.  Clovia Cuff, PharmD, BCPS 12/24/2022 1:23 PM

## 2022-12-24 NOTE — Hospital Course (Addendum)
[name] is a 82 year old female with history of non-insulin-dependent diabetes mellitus, hyperlipidemia, depression, who presents emergency department for chief concerns of altered mental status. Upon arriving hospital, patient was hypotensive, received 3 L fluid bolus. CT abdomen pelvis wo contrast: Was read as mild fat stranding surrounding the left upper ureter without discrete obstructing mass or calculi.  Mild left perirenal fat stranding as well.  Correlate with urine analysis for evidence of urinary tract infection.  Patient was placed on Rocephin for urinary tract infection. Blood culture positive for E. Coli.

## 2022-12-24 NOTE — Assessment & Plan Note (Signed)
This meets criteria for morbid obesity based on the presence of 1 or more chronic comorbidities. Patient has 36.14, with HTN, CAD. This complicates overall care and prognosis.

## 2022-12-24 NOTE — Assessment & Plan Note (Addendum)
With Encephalopathy Organ involvement is renal and neurology Etiology workup in progress Blood cultures x 2, urine culture are in process Check procalcitonin on admission Discontinue ceftriaxone 2 g IV daily Ordered broad-spectrum antibiotic with metronidazole, vancomycin, cefepime PCCM specialist was consulted and recommends goal MAP > 55 Admit to stepdown, inpatient

## 2022-12-24 NOTE — ED Provider Notes (Signed)
[facility] Provider Note    Event Date/Time   First MD Initiated Contact with Patient 12/24/22 [phone]     (approximate)   History   possible sepsis   HPI  Rebecca Gilmore is a 82 year old female with history of A-fib, CHF, CKD, HTN presenting to the emergency department for evaluation of altered mental status.  Patient reportedly not confused at baseline, but was not appropriately oriented this morning.  Was also noted to have difficulty getting around with the walker she uses at baseline.  Axillary temp of 103 with EMS.  Patient is not sure why she is in the emergency room, but is able to tell me that we are at [facility].  She denies pain anywhere.  No fevers or chills.  Denies chest pain, shortness of breath, abdominal pain.  I did review her discharge summary from 08/05/2022.  At that time, patient presented with chest pain was found to have a STEMI for which she was transferred to [facility].  I also reviewed her discharge summary from 07/22/2022.  At that time, patient presented with shortness of breath.  Was noted to have some low blood pressures during that admission.  Her echo at that time demonstrated an EF of 55 to 60%, though of note obtained prior to STEMI later that month.    Physical Exam   Triage Vital Signs: ED Triage Vitals  Encounter Vitals Group     BP 12/24/22 0918 (!) 80/44     Systolic BP Percentile --      Diastolic BP Percentile --      Pulse Rate 12/24/22 0920 (!) 111     Resp 12/24/22 0918 20     Temp 12/24/22 0920 98.1 F (36.7 C)     Temp Source 12/24/22 0920 Oral     SpO2 12/24/22 0920 97 %     Weight 12/24/22 0930 210 lb 8.6 oz (95.5 kg)     Height 12/24/22 0923 5\' 4"  (1.626 m)     Head Circumference --      Peak Flow --      Pain Score 12/24/22 0921 0     Pain Loc --      Pain Education --      Exclude from Growth Chart --     Most recent vital signs: Vitals:   12/24/22 1500 12/24/22 1530  BP: (!) 130/58 (!) 127/55  Pulse: 86  81  Resp: 19 18  Temp:    SpO2: 99% 100%     General: Awake, interactive  CV:  Tachycardic with regular rhythm, good peripheral perfusion Resp:  Lungs clear, unlabored respirations.  Abd:  Soft, nondistended, no appreciable tenderness to palpation Neuro:  Keenly aware, correctly answers month, thinks the year is 2025, following commands, normal extraocular movements, no appreciable field cut, no gross facial asymmetry, mild symmetric weakness of the bilateral upper and lower extremities, no appreciable ataxia, intact sensation to light touch, no aphasia, dysarthria, inattention  ED Results / Procedures / Treatments   Labs (all labs ordered are listed, but only abnormal results are displayed) Labs Reviewed  COMPREHENSIVE METABOLIC PANEL - Abnormal; Notable for the following components:      Result Value   Sodium 131 (*)    CO2 21 (*)    Glucose, Bld 105 (*)    BUN 33 (*)    Creatinine, Ser 2.48 (*)    Calcium 8.7 (*)    Albumin 3.3 (*)    Total Bilirubin 1.4 (*)  GFR, Estimated 19 (*)    All other components within normal limits  CBC WITH DIFFERENTIAL/PLATELET - Abnormal; Notable for the following components:   WBC 23.5 (*)    RBC 3.59 (*)    Hemoglobin 10.3 (*)    HCT 32.0 (*)    Neutro Abs 21.0 (*)    Monocytes Absolute 1.1 (*)    Abs Immature Granulocytes 0.25 (*)    All other components within normal limits  PROTIME-INR - Abnormal; Notable for the following components:   Prothrombin Time 17.1 (*)    INR 1.4 (*)    All other components within normal limits  URINALYSIS, W/ REFLEX TO CULTURE (INFECTION SUSPECTED) - Abnormal; Notable for the following components:   Color, Urine YELLOW (*)    APPearance HAZY (*)    Glucose, UA >=500 (*)    Hgb urine dipstick SMALL (*)    Protein, ur 30 (*)    Leukocytes,Ua SMALL (*)    Bacteria, UA MANY (*)    All other components within normal limits  RESP PANEL BY RT-PCR (RSV, FLU A&B, COVID)  RVPGX2  CULTURE, BLOOD (ROUTINE X  2)  CULTURE, BLOOD (ROUTINE X 2)  URINE CULTURE  LACTIC ACID, PLASMA  LACTIC ACID, PLASMA  PROCALCITONIN     EKG EKG independently reviewed interpreted by myself (ER attending) demonstrates:  EKG demonstrates paced rhythm at a rate of 109, QRS 135, QTc 507, no appreciable superimposed ischemia  RADIOLOGY Imaging independently reviewed and interpreted by myself demonstrates:  CT head without acute bleed CT A/P With fat stranding around the ureter concerning for UTI, but no stone or other acute findings  Chest x-Knut Rondinelli without acute abnormality  PROCEDURES:  Critical Care performed: Yes, see critical care procedure note(s)  CRITICAL CARE Performed by: Trinna Post   Total critical care time: 41 minutes  Critical care time was exclusive of separately billable procedures and treating other patients.  Critical care was necessary to treat or prevent imminent or life-threatening deterioration.  Critical care was time spent personally by me on the following activities: development of treatment plan with patient and/or surrogate as well as nursing, discussions with consultants, evaluation of patient's response to treatment, examination of patient, obtaining history from patient or surrogate, ordering and performing treatments and interventions, ordering and review of laboratory studies, ordering and review of radiographic studies, pulse oximetry and re-evaluation of patient's condition.   Procedures   MEDICATIONS ORDERED IN ED: Medications  acetaminophen (TYLENOL) tablet 650 mg (has no administration in time range)    Or  acetaminophen (TYLENOL) suppository 650 mg (has no administration in time range)  ondansetron (ZOFRAN) tablet 4 mg (has no administration in time range)    Or  ondansetron (ZOFRAN) injection 4 mg (has no administration in time range)  heparin injection 5,000 Units (has no administration in time range)  metroNIDAZOLE (FLAGYL) IVPB 500 mg (0 mg Intravenous Stopped  12/24/22 1513)  vancomycin (VANCOREADY) IVPB 2000 mg/400 mL (has no administration in time range)  ceFEPIme (MAXIPIME) 2 g in sodium chloride 0.9 % 100 mL IVPB (0 g Intravenous Stopped 12/24/22 1515)  vancomycin (VANCOREADY) IVPB 750 mg/150 mL (has no administration in time range)  0.9 %  sodium chloride infusion (has no administration in time range)  rosuvastatin (CRESTOR) tablet 5 mg (has no administration in time range)  amitriptyline (ELAVIL) tablet 25 mg (has no administration in time range)  sertraline (ZOLOFT) tablet 50 mg (has no administration in time range)  pantoprazole (PROTONIX) EC tablet 40  mg (has no administration in time range)  clopidogrel (PLAVIX) tablet 75 mg (has no administration in time range)  cyanocobalamin (VITAMIN B12) tablet 1,000 mcg (has no administration in time range)  ferrous sulfate tablet 325 mg (has no administration in time range)  pregabalin (LYRICA) capsule 75 mg (has no administration in time range)  cholecalciferol (VITAMIN D3) 25 MCG (1000 UNIT) tablet 1,000 Units (has no administration in time range)  acetaminophen (OFIRMEV) IV 1,000 mg (has no administration in time range)  oxyCODONE (Oxy IR/ROXICODONE) immediate release tablet 5 mg (has no administration in time range)  lidocaine (LIDODERM) 5 % 2 patch (has no administration in time range)  lactated ringers bolus 1,000 mL (0 mLs Intravenous Stopped 12/24/22 1251)    And  lactated ringers bolus 1,000 mL (0 mLs Intravenous Stopped 12/24/22 1425)     IMPRESSION / MDM / ASSESSMENT AND PLAN / ED COURSE  I reviewed the triage vital signs and the nursing notes.  Differential diagnosis includes, but is not limited to, pneumonia, UTI, anemia, electrolyte abnormality, acute intracranial process, consideration for acute CVA though without appreciable focal deficits or evidence of large vessel occlusion  Patient's presentation is most consistent with acute presentation with potential threat to life or  bodily function.  82 year old female presenting with altered mental status found to be tachycardic and hypotensive on arrival.  Sepsis orders were initiated with empiric ceftriaxone for possible UTI as well as 30 cc/kg of fluid given her hypotension.  With her heart failure, will plan to discontinue early if she develops signs of respiratory distress.  Urine did return concerning for infection with a catheterized sample with many bacteria, 11-20 white blood cells, small leukocyte esterase.  Culture sent.  Will obtain CT abdomen pelvis to ensure no evidence of infected stone given abnormal vitals.  CT abdomen pelvis without evidence of stone, evidence of ureteral inflammation concerning for infection noted.  CT head without acute findings.   Clinical Course as of 12/24/22 1630  Thu Dec 24, 2022  1214 BP(!): 88/35 Patient with persistent hypotension after 1 L of IV fluids.  Family states that they are confident that it is not unusual for patient to have blood pressures this low.  I did review her recent outpatient visits, does have low SBP often between 90 and 110.  On 7/16, saw cardiology with a blood pressure of 85/52, no specific interventions noted for this that I can see.  Case was reviewed with Dr. Belia Heman with ICU. He recommended proceeding with remainder of patient's fluid bolus of 30 cc/kg, but overall had lower suspicion for hypotension secondary to septic shock particularly in the setting of normal lactate.  He did feel that it was okay for patient to have a lower MAP goal and did not think she required ICU admission at this time.  Will complete IV fluids and reach out to hospitalist team for admission. [NR]  1255 Case reviewed with Dr. Sedalia Muta.  She will evaluate the patient for anticipated admission.  [NR]    Clinical Course User Index [NR] Trinna Post, MD     FINAL CLINICAL IMPRESSION(S) / ED DIAGNOSES   Final diagnoses:  Acute cystitis without hematuria     Rx / DC Orders   ED  Discharge Orders     None        Note:  This document was prepared using Dragon voice recognition software and may include unintentional dictation errors.   Trinna Post, MD 12/24/22 1630

## 2022-12-24 NOTE — Progress Notes (Signed)
CODE SEPSIS - PHARMACY COMMUNICATION  **Broad Spectrum Antibiotics should be administered within 1 hour of Sepsis diagnosis**  Time Code Sepsis Called/Page Received: 09:37  Antibiotics Ordered: Ceftriaxone  Time of 1st antibiotic administration: 10:15  Additional action taken by pharmacy: n/a  If necessary, Name of Provider/Nurse Contacted: n/a    Foye Deer ,PharmD Clinical Pharmacist  12/24/2022  9:43 AM

## 2022-12-24 NOTE — Assessment & Plan Note (Signed)
Suspect secondary to severe sepsis Recheck CBC in a.m.

## 2022-12-24 NOTE — Plan of Care (Signed)
  Problem: Education: Goal: Understanding of CV disease, CV risk reduction, and recovery process will improve Outcome: Progressing Goal: Individualized Educational Video(s) Outcome: Progressing   Problem: Activity: Goal: Ability to return to baseline activity level will improve Outcome: Progressing   Problem: Cardiovascular: Goal: Ability to achieve and maintain adequate cardiovascular perfusion will improve Outcome: Progressing Goal: Vascular access site(s) Level 0-1 will be maintained Outcome: Progressing   Problem: Health Behavior/Discharge Planning: Goal: Ability to safely manage health-related needs after discharge will improve Outcome: Progressing   Problem: Education: Goal: Understanding of post-operative needs will improve Outcome: Progressing Goal: Individualized Educational Video(s) Outcome: Progressing   Problem: Clinical Measurements: Goal: Postoperative complications will be avoided or minimized Outcome: Progressing   Problem: Respiratory: Goal: Will regain and/or maintain adequate ventilation Outcome: Progressing   Problem: Fluid Volume: Goal: Hemodynamic stability will improve Outcome: Progressing   Problem: Clinical Measurements: Goal: Diagnostic test results will improve Outcome: Progressing Goal: Signs and symptoms of infection will decrease Outcome: Progressing   Problem: Respiratory: Goal: Ability to maintain adequate ventilation will improve Outcome: Progressing   Problem: Education: Goal: Knowledge of General Education information will improve Description: Including pain rating scale, medication(s)/side effects and non-pharmacologic comfort measures Outcome: Progressing   Problem: Health Behavior/Discharge Planning: Goal: Ability to manage health-related needs will improve Outcome: Progressing   Problem: Clinical Measurements: Goal: Ability to maintain clinical measurements within normal limits will improve Outcome: Progressing Goal:  Will remain free from infection Outcome: Progressing Goal: Diagnostic test results will improve Outcome: Progressing Goal: Respiratory complications will improve Outcome: Progressing Goal: Cardiovascular complication will be avoided Outcome: Progressing   Problem: Activity: Goal: Risk for activity intolerance will decrease Outcome: Progressing   Problem: Nutrition: Goal: Adequate nutrition will be maintained Outcome: Progressing   Problem: Coping: Goal: Level of anxiety will decrease Outcome: Progressing   Problem: Elimination: Goal: Will not experience complications related to bowel motility Outcome: Progressing Goal: Will not experience complications related to urinary retention Outcome: Progressing   Problem: Pain Managment: Goal: General experience of comfort will improve Outcome: Progressing   Problem: Safety: Goal: Ability to remain free from injury will improve Outcome: Progressing   Problem: Skin Integrity: Goal: Risk for impaired skin integrity will decrease Outcome: Progressing

## 2022-12-24 NOTE — Assessment & Plan Note (Signed)
Home rosuvastatin 5 mg nightly resumed

## 2022-12-24 NOTE — TOC Initial Note (Signed)
Transition of Care [facility]) - Initial/Assessment Note    Patient Details  Name: Rebecca Gilmore MRN: [id number] Date of Birth: June 08, 1940  [facility] CM/SW Contact:    Marquita Palms, LCSW Phone Number: 12/24/2022, 4:02 PM  Clinical Narrative:                  CSW met with patient, daughter, and husband at bedside. Patient reports that she has a RW, and a walker at home. Patients daughter reported that she had not been her usual self and they sent her to the hospital. Daughter reports patients nephew lives behind the patient and her husband to help with them. [facility] reports [facility] [place] for [facility] needs. No other needs at this time.       Patient Goals and CMS Choice            Expected Discharge Plan and Services                                              Prior Living Arrangements/Services                       Activities of Daily Living      Permission Sought/Granted                  Emotional Assessment              Admission diagnosis:  Severe sepsis with acute organ dysfunction (HCC) [A41.9, R65.20] Patient Active Problem List   Diagnosis Date Noted   Severe sepsis with acute organ dysfunction (HCC) 12/24/2022   CKD stage 3b, GFR 30-44 ml/min (HCC) 12/24/2022   AKI (acute kidney injury) (HCC) 12/24/2022   Leukocytosis 12/24/2022   Left leg pain 12/24/2022   Morbid obesity (HCC) 12/24/2022   Acute ST elevation myocardial infarction (STEMI) involving left anterior descending (LAD) coronary artery (HCC) 08/05/2022   Acute on chronic diastolic CHF (congestive heart failure) (HCC) 07/21/2022   History of recurrent GI bleed 07/19/2022   Anemia of chronic renal failure, stage 3b (HCC) 07/19/2022   Coronary artery disease 07/19/2022   Elevated troponin 07/19/2022   Hypotension 07/19/2022   Anemia of chronic kidney failure, stage 4 (severe) (HCC) 02/20/2022   Symptomatic anemia 02/18/2022   Morbid  obesity with BMI of 40.0-44.9, adult (HCC) 02/18/2022   Acute on chronic diastolic (congestive) heart failure (HCC) 02/18/2022   Anemia, unspecified 02/18/2022   Chronic kidney disease, stage 3b (HCC) 01/27/2022   Hypokalemia 01/27/2022   Acute on chronic blood loss anemia 01/27/2022   CKD (chronic kidney disease) stage 4, GFR 15-29 ml/min (HCC) 12/23/2021   Anemia    GI bleeding 12/11/2021   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   Acute blood loss anemia 12/11/2021   Depression    HLD (hyperlipidemia)    Iron deficiency anemia    Chronic diastolic CHF (congestive heart failure) (HCC)    Myocardial injury    Atrial fibrillation, chronic (HCC)    Femoral artery occlusion, 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   Atrial fibrillation and flutter (HCC) 04/18/2021   Acute heart failure (HCC) 03/05/2021   Acute respiratory failure with hypoxia (HCC) 03/05/2021   Influenza A 03/05/2021   NSTEMI (non-ST elevated myocardial infarction) (HCC) 03/05/2021   Bradycardia 06/27/2019   Peripheral edema 01/18/2018   Severe aortic stenosis 09/21/2017  SOB (shortness of breath) on exertion 09/21/2017   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 [place] [address] Phone: [phone] Fax: [phone]  [facility] [address] [place] - [address] [place] [address] Phone: [phone] Fax: [phone]     Social Determinants of Health (SDOH) Social History: SDOH Screenings   Food Insecurity: No Food Insecurity (02/18/2022)  Housing: Low Risk  (02/18/2022)  Transportation Needs: No Transportation Needs (08/06/2022)   Received from [facility], [facility]  Utilities: Not At Risk (02/18/2022)  Financial Resource Strain: Low Risk  (03/07/2021)   Received from [facility], [facility]   Tobacco Use: Medium Risk (12/24/2022)   SDOH Interventions:     Readmission Risk Interventions    12/24/2022    4:02 PM  Readmission Risk Prevention Plan  Transportation Screening Complete  HRI or Home Care Consult Complete  SW Recovery Care/Counseling Consult Complete  Palliative Care Screening Not Applicable  Skilled Nursing Facility Not Applicable

## 2022-12-24 NOTE — Sepsis Progress Note (Signed)
Code Sepsis protocol being monitored by eLink.

## 2022-12-24 NOTE — Assessment & Plan Note (Signed)
Resumed home Plavix 75 mg daily, rosuvastatin 5 mg daily

## 2022-12-24 NOTE — ED Notes (Signed)
Pt to xray

## 2022-12-24 NOTE — ED Triage Notes (Signed)
Pt arrives via [facility] from home with c/o possible sepsis. Per EMS pt's family reported that pt is usually A&Ox4, but is A&Ox1 this morning. Pt also usually gets around with a walker and is also not able to do that this morning. Per EMS pt was running a fever of 103 axillary. Pt appears in NAD.

## 2022-12-24 NOTE — Assessment & Plan Note (Addendum)
Patient endorses pain with ambulation, weakness numbness occasionally down the leg Patient endorses this has been worsening over the last year ABIs is considered however patient had an ABI on 12/31/2022.  Recommended with patient's daughter to follow-up with vascular outpatient [place] precaution

## 2022-12-24 NOTE — Progress Notes (Signed)
PHARMACY - PHYSICIAN COMMUNICATION CRITICAL VALUE ALERT - BLOOD CULTURE IDENTIFICATION (BCID)  Results for orders placed or performed during the hospital encounter of 12/24/22  Culture, blood (Routine x 2)     Status: None (Preliminary result)   Collection Time: 12/24/22  9:17 AM   Specimen: BLOOD  Result Value Ref Range Status   Specimen Description BLOOD LEFT FA  Final   Special Requests   Final    BOTTLES DRAWN AEROBIC AND ANAEROBIC Blood Culture adequate volume   Culture  Setup Time   Final    Organism ID to follow GRAM NEGATIVE RODS ANAEROBIC BOTTLE ONLY CRITICAL RESULT CALLED TO, READ BACK BY AND VERIFIED WITH: Ezelle Surprenant BELEUE @2307  ON 12/24/22 SKL Performed at [facility], [address] [place], [place] [address]    Culture PENDING  Incomplete   Report Status PENDING  Incomplete  Blood Culture ID Panel (Reflexed)     Status: Abnormal   Collection Time: 12/24/22  9:17 AM  Result Value Ref Range Status   Enterococcus faecalis NOT DETECTED NOT DETECTED Final   Enterococcus Faecium NOT DETECTED NOT DETECTED Final   Listeria monocytogenes NOT DETECTED NOT DETECTED Final   Staphylococcus species NOT DETECTED NOT DETECTED Final   Staphylococcus aureus (BCID) NOT DETECTED NOT DETECTED Final   Staphylococcus epidermidis NOT DETECTED NOT DETECTED Final   Staphylococcus lugdunensis NOT DETECTED NOT DETECTED Final   Streptococcus species NOT DETECTED NOT DETECTED Final   Streptococcus agalactiae NOT DETECTED NOT DETECTED Final   Streptococcus pneumoniae NOT DETECTED NOT DETECTED Final   Streptococcus pyogenes NOT DETECTED NOT DETECTED Final   A.calcoaceticus-baumannii NOT DETECTED NOT DETECTED Final   Bacteroides fragilis NOT DETECTED NOT DETECTED Final   Enterobacterales DETECTED (A) NOT DETECTED Final    Comment: Enterobacterales represent a large order of gram negative bacteria, not a single organism. CRITICAL RESULT CALLED TO, READ BACK BY AND VERIFIED WITH: Lorinda Copland  BELEUE @2307  ON 12/24/22 SKL    Enterobacter cloacae complex NOT DETECTED NOT DETECTED Final   Escherichia coli DETECTED (A) NOT DETECTED Final    Comment: CRITICAL RESULT CALLED TO, READ BACK BY AND VERIFIED WITH: Deaven Barron BELEUE @2307  ON 12/24/22 SKL    Klebsiella aerogenes NOT DETECTED NOT DETECTED Final   Klebsiella oxytoca NOT DETECTED NOT DETECTED Final   Klebsiella pneumoniae NOT DETECTED NOT DETECTED Final   Proteus species NOT DETECTED NOT DETECTED Final   Salmonella species NOT DETECTED NOT DETECTED Final   Serratia marcescens NOT DETECTED NOT DETECTED Final   Haemophilus influenzae NOT DETECTED NOT DETECTED Final   Neisseria meningitidis NOT DETECTED NOT DETECTED Final   Pseudomonas aeruginosa NOT DETECTED NOT DETECTED Final   Stenotrophomonas maltophilia NOT DETECTED NOT DETECTED Final   Candida albicans NOT DETECTED NOT DETECTED Final   Candida auris NOT DETECTED NOT DETECTED Final   Candida glabrata NOT DETECTED NOT DETECTED Final   Candida krusei NOT DETECTED NOT DETECTED Final   Candida parapsilosis NOT DETECTED NOT DETECTED Final   Candida tropicalis NOT DETECTED NOT DETECTED Final   Cryptococcus neoformans/gattii NOT DETECTED NOT DETECTED Final   CTX-M ESBL NOT DETECTED NOT DETECTED Final   Carbapenem resistance IMP NOT DETECTED NOT DETECTED Final   Carbapenem resistance KPC NOT DETECTED NOT DETECTED Final   Carbapenem resistance NDM NOT DETECTED NOT DETECTED Final   Carbapenem resist OXA 48 LIKE NOT DETECTED NOT DETECTED Final   Carbapenem resistance VIM NOT DETECTED NOT DETECTED Final    Comment: Performed at [facility], [address] [place], [place] [address]  Resp panel by RT-PCR (RSV, Flu A&B, Covid) Anterior Nasal Swab     Status: None   Collection Time: 12/24/22 11:57 AM   Specimen: Anterior Nasal Swab  Result Value Ref Range Status   SARS Coronavirus 2 by RT PCR NEGATIVE NEGATIVE Final    Comment: (NOTE) SARS-CoV-2 target nucleic acids are  NOT DETECTED.  The SARS-CoV-2 RNA is generally detectable in upper respiratory specimens during the acute phase of infection. The lowest concentration of SARS-CoV-2 viral copies this assay can detect is 138 copies/mL. A negative result does not preclude SARS-Cov-2 infection and should not be used as the sole basis for treatment or other patient management decisions. A negative result may occur with  improper specimen collection/handling, submission of specimen other than nasopharyngeal swab, presence of viral mutation(s) within the areas targeted by this assay, and inadequate number of viral copies(<138 copies/mL). A negative result must be combined with clinical observations, patient history, and epidemiological information. The expected result is Negative.  Fact Sheet for Patients:  BloggerCourse.com  Fact Sheet for Healthcare Providers:  SeriousBroker.it  This test is no t yet approved or cleared by the [place] FDA and  has been authorized for detection and/or diagnosis of SARS-CoV-2 by FDA under an Emergency Use Authorization (EUA). This EUA will remain  in effect (meaning this test can be used) for the duration of the COVID-19 declaration under Section 564(b)(1) of the Act, 21 U.S.C.section 360bbb-3(b)(1), unless the authorization is terminated  or revoked sooner.       Influenza A by PCR NEGATIVE NEGATIVE Final   Influenza B by PCR NEGATIVE NEGATIVE Final    Comment: (NOTE) The Xpert Xpress SARS-CoV-2/FLU/RSV plus assay is intended as an aid in the diagnosis of influenza from Nasopharyngeal swab specimens and should not be used as a sole basis for treatment. Nasal washings and aspirates are unacceptable for Xpert Xpress SARS-CoV-2/FLU/RSV testing.  Fact Sheet for Patients: BloggerCourse.com  Fact Sheet for Healthcare Providers: SeriousBroker.it  This test is not  yet approved or cleared by the [place] FDA and has been authorized for detection and/or diagnosis of SARS-CoV-2 by FDA under an Emergency Use Authorization (EUA). This EUA will remain in effect (meaning this test can be used) for the duration of the COVID-19 declaration under Section 564(b)(1) of the Act, 21 U.S.C. section 360bbb-3(b)(1), unless the authorization is terminated or revoked.     Resp Syncytial Virus by PCR NEGATIVE NEGATIVE Final    Comment: (NOTE) Fact Sheet for Patients: BloggerCourse.com  Fact Sheet for Healthcare Providers: SeriousBroker.it  This test is not yet approved or cleared by the [place] FDA and has been authorized for detection and/or diagnosis of SARS-CoV-2 by FDA under an Emergency Use Authorization (EUA). This EUA will remain in effect (meaning this test can be used) for the duration of the COVID-19 declaration under Section 564(b)(1) of the Act, 21 U.S.C. section 360bbb-3(b)(1), unless the authorization is terminated or revoked.  Performed at [facility], [address] [place], [place] [address]     BCID Results: 1 (anaerobic) of 4 bottles with E coli, no resistance detected.  Pt currently on Cefepime, Flagyl, & Vancomycin  Name of provider contacted: Andrez Grime, MD   Changes to prescribed antibiotics required: Stop Flagyl, transition cefepime to ceftriaxone, & continue Vancomycin at this time.  Otelia Sergeant, PharmD, [facility] 12/25/2022 3:37 AM

## 2022-12-24 NOTE — Assessment & Plan Note (Addendum)
Presumed secondary to severe sepsis, treat per above Status post 3 L LR ordered by EDP On admission we will order sodium chloride infusion at 100 mL/h, 15 hours ordered

## 2022-12-24 NOTE — ED Notes (Signed)
Pt placed on the bedpan and urinated and had a medium sized bowel movement. Pt was cleaned, a new brief and chuck pad were applied. Pt tolerated activity well. NAD. Pt is clean and dry at this time.

## 2022-12-24 NOTE — Assessment & Plan Note (Signed)
Resumed home sertraline 50 mg daily, amitriptyline 25 mg nightly as needed for sleep

## 2022-12-24 NOTE — H&P (Addendum)
History and Physical   Rebecca Gilmore WUJ:[id number] DOB: 08/29/1940 DOA: 12/24/2022  PCP: Rebecca Ravel, MD  Outpatient Specialists: Dr. Zebedee Gilmore, [facility] cardiology Patient coming from: Home  I have personally briefly reviewed patient's old medical records in [facility] EMR.  Chief Concern: Altered mental status  HPI: Rebecca Gilmore is a 82 year old female with history of non-insulin-dependent diabetes mellitus, hyperlipidemia, depression, who presents emergency department for chief concerns of altered mental status.  Vitals in the ED showed temperature 98.1, respiration rate 21, heart rate of 116, blood pressure initially 91/45, SpO2 of 98% on room air.  Serum sodium is 131, potassium 3.9, chloride 100, bicarb 21, BUN of 33, serum creatinine of 2.48, EGFR of 19, nonfasting blood glucose 105, WBC 23.5, hemoglobin 10.3, platelets of 228.  Lactic acid is 1.6.  UA was positive for small leukocytes.  COVID/influenza A/influenza B/RSV PCR were negative.  CT abdomen pelvis wo contrast: Was read as mild fat stranding surrounding the left upper ureter without discrete obstructing mass or calculi.  Mild left perirenal fat stranding as well.  Correlate with urine analysis for evidence of urinary tract infection.  Moderate multilevel degenerative changes in the visualized spine.  Mild right and moderate to severe left degenerative changes.  ED treatment: Ceftriaxone 2 g IV, LR 3 L bolus. --------------------------------- At bedside, she is able to tell me her name, age, current location, current calendar year.  Daughter at bedside reprots that grandson noticed she was altered today. Daughter states she was slower than normal.   She has dry cough, that is not productive for about two weeks.   Patient reports she has intermittent wheezing all her life.   Social history: She lives with her husband, Leonette Most. She is a former tobacco user. She denies etoh and recreational drugs. She is retired  and was Neurosurgeon.   ROS: Constitutional: no weight change, no fever ENT/Mouth: no sore throat, no rhinorrhea Eyes: no eye pain, no vision changes Cardiovascular: no chest pain, no dyspnea,  no edema, no palpitations Respiratory: + cough, no sputum, + wheezing Gastrointestinal: no nausea, no vomiting, no diarrhea, no constipation Genitourinary: no urinary incontinence, no dysuria, no hematuria Musculoskeletal: no arthralgias, no myalgias Skin: no skin lesions, no pruritus, Neuro: + weakness, no loss of consciousness, no syncope Psych: no anxiety, no depression, no decrease appetite Heme/Lymph: no bruising, no bleeding  ED Course: Discussed with EDP, patient requiring hospitalization for chief concerns of severe sepsis.  Assessment/Plan  Principal Problem:   Severe sepsis with acute organ dysfunction (HCC) Active Problems:   AKI (acute kidney injury) (HCC)   Coronary artery disease   Iron deficiency anemia   Anemia of chronic renal failure, stage 3b (HCC)   HLD (hyperlipidemia)   Depression   CKD stage 3b, GFR 30-44 ml/min (HCC)   Leukocytosis   Left leg pain   Morbid obesity (HCC)   Assessment and Plan:  * Severe sepsis with acute organ dysfunction (HCC) With Encephalopathy Organ involvement is renal and neurology Etiology workup in progress Blood cultures x 2, urine culture are in process Check procalcitonin on admission Discontinue ceftriaxone 2 g IV daily Ordered broad-spectrum antibiotic with metronidazole, vancomycin, cefepime PCCM specialist was consulted and recommends goal MAP > 55 Admit to stepdown, inpatient  AKI (acute kidney injury) (HCC) Presumed secondary to severe sepsis, treat per above Status post 3 L LR ordered by EDP On admission we will order sodium chloride infusion at 100 mL/h, 15 hours ordered  Coronary artery disease  Resumed home Plavix 75 mg daily, rosuvastatin 5 mg daily  Anemia of chronic renal failure, stage 3b (HCC) Baseline  hemoglobin is 7.3-9.0 Resume home iron supplementation  HLD (hyperlipidemia) Home rosuvastatin 5 mg nightly resumed  Depression Resumed home sertraline 50 mg daily, amitriptyline 25 mg nightly as needed for sleep  Morbid obesity (HCC) This meets criteria for morbid obesity based on the presence of 1 or more chronic comorbidities. Patient has 36.14, with HTN, CAD. This complicates overall care and prognosis.   Left leg pain Patient endorses pain with ambulation, weakness numbness occasionally down the leg Patient endorses this has been worsening over the last year ABIs is considered however patient had an ABI on 12/31/2022.  Recommended with patient's daughter to follow-up with vascular outpatient Fall precaution  Leukocytosis Suspect secondary to severe sepsis Recheck CBC in a.m.  Chart reviewed.   DVT prophylaxis: Heparin 5000 units subcutaneous every 8 hours Code Status: DNR, confirmed with patient and daughter at bedside Diet: Heart healthy/carb modified Family Communication: Updated daughter, Boneta Lucks and spouse, Leonette Most were at bedside and questions were answered.  Disposition Plan: Pending clinical course Consults called: none at this time Admission status: Stepdown, inpatient  Past Medical History:  Diagnosis Date   Anemia    Arrhythmia    atrial fibrillation   CHF (congestive heart failure) (HCC)    Chronic kidney disease    Depression    HBP (high blood pressure)    History of bladder problems    Memory loss    Muscle pain    Osteoporosis    Reflux    Sinus congestion    Swelling    B/L FEET AND LEGS   Past Surgical History:  Procedure Laterality Date   ARTERY BIOPSY Bilateral 12/01/2016   Procedure: BIOPSY TEMPORAL ARTERY;  Surgeon: Fransisco Hertz, MD;  Location: [facility] OR;  Service: Vascular;  Laterality: Bilateral;   CATARACT SURGERY     COLONOSCOPY WITH PROPOFOL N/A 12/13/2021   Procedure: COLONOSCOPY WITH PROPOFOL;  Surgeon: Wyline Mood, MD;  Location: [facility]  ENDOSCOPY;  Service: Gastroenterology;  Laterality: N/A;   CORONARY/GRAFT ACUTE MI REVASCULARIZATION N/A 08/05/2022   Procedure: Coronary/Graft Acute MI Revascularization;  Surgeon: Marcina Millard, MD;  Location: [facility] INVASIVE CV LAB;  Service: Cardiovascular;  Laterality: N/A;   ESOPHAGOGASTRODUODENOSCOPY (EGD) WITH PROPOFOL N/A 12/12/2021   Procedure: ESOPHAGOGASTRODUODENOSCOPY (EGD) WITH PROPOFOL;  Surgeon: Jaynie Collins, DO;  Location: [facility] ENDOSCOPY;  Service: Gastroenterology;  Laterality: N/A;   EYELID SURGERY Bilateral    GIVENS CAPSULE STUDY N/A 12/15/2021   Procedure: GIVENS CAPSULE STUDY;  Surgeon: Jaynie Collins, DO;  Location: [facility] ENDOSCOPY;  Service: Gastroenterology;  Laterality: N/A;   GIVENS CAPSULE STUDY N/A 01/28/2022   Procedure: GIVENS CAPSULE STUDY;  Surgeon: Toney Reil, MD;  Location: [facility] ENDOSCOPY;  Service: Gastroenterology;  Laterality: N/A;   GROWTH ON FACE     KNEE SURGERY Right    LEFT HEART CATH AND CORONARY ANGIOGRAPHY N/A 08/05/2022   Procedure: LEFT HEART CATH AND CORONARY ANGIOGRAPHY;  Surgeon: Marcina Millard, MD;  Location: [facility] INVASIVE CV LAB;  Service: Cardiovascular;  Laterality: N/A;   THROMBECTOMY FEMORAL ARTERY Left 11/16/2021   Procedure: THROMBECTOMY FEMORAL ARTERY;  Surgeon: Fransisco Hertz, MD;  Location: [facility] ORS;  Service: Vascular;  Laterality: Left;   Social History:  reports that she quit smoking about 29 years ago. Her smoking use included cigarettes. She has quit using smokeless tobacco.  Her smokeless tobacco use included snuff and chew. She reports  that she does not drink alcohol and does not use drugs.  Allergies  Allergen Reactions   Chlorhexidine Rash   Morphine Hives and Itching    Other Reaction(s): Confusion   Other Hives, Itching and Other (See Comments)    Other Reaction(s): Confusion   Morphine And Codeine Itching   Kenalog [Triamcinolone Acetonide] Other (See Comments)    FLUSH IN FACE    Penicillins Other (See Comments)    Has patient had a PCN reaction causing immediate rash, facial/tongue/throat swelling, SOB or lightheadedness with hypotension: Unknown Has patient had a PCN reaction causing severe rash involving mucus membranes or skin necrosis: Unknown Has patient had a PCN reaction that required hospitalization:No Has patient had a PCN reaction occurring within the last 10 years: No If all of the above answers are "NO", then may proceed with Cephalosporin use.    Latex Rash   Povidone-Iodine Rash    burning   Family History  Problem Relation Age of Onset   Heart disease Mother    AAA (abdominal aortic aneurysm) Mother    Heart disease Father    Family history: Family history reviewed and not pertinent.  Prior to Admission medications   Medication Sig Start Date End Date Taking? Authorizing Provider  acetaminophen (TYLENOL) 325 MG tablet Take 2 tablets (650 mg total) by mouth every 6 (six) hours as needed for mild pain or fever. 12/15/21   Sunnie Nielsen, DO  amitriptyline (ELAVIL) 25 MG tablet Take 25 mg by mouth at bedtime as needed. 07/06/21   [provider]  aspirin 81 MG chewable tablet Chew 1 tablet (81 mg total) by mouth daily. Patient not taking: Reported on 12/08/2022 08/06/22   Marrion Coy, MD  cholecalciferol (VITAMIN D3) 25 MCG (1000 UT) tablet Take 1,000 Units by mouth daily.    [provider]  clopidogrel (PLAVIX) 75 MG tablet TAKE 1 TABLET BY MOUTH EVERY DAY 09/23/22   Georgiana Spinner, NP  cyanocobalamin (VITAMIN B12) 1000 MCG tablet Take 1,000 mcg by mouth daily.    [provider]  empagliflozin (JARDIANCE) 10 MG TABS tablet Take 1 tablet by mouth daily. 11/11/22 11/11/23  [provider]  Ferrous Sulfate (IRON) 325 (65 Fe) MG TABS Take 1 tablet by mouth daily. 09/07/21   [provider]  loratadine (CLARITIN) 10 MG tablet Take 10 mg by mouth daily as needed.    [provider]  pantoprazole  (PROTONIX) 40 MG tablet Take 40 mg by mouth at bedtime. 10/01/21   [provider]  potassium chloride (MICRO-K) 10 MEQ CR capsule Take by mouth. 11/11/22 11/11/23  [provider]  pregabalin (LYRICA) 75 MG capsule Take 1 capsule (75 mg total) by mouth daily. 11/25/21   Georgiana Spinner, NP  rosuvastatin (CRESTOR) 5 MG tablet Take by mouth. 11/11/22 11/11/23  [provider]  sertraline (ZOLOFT) 50 MG tablet Take 50 mg by mouth daily. 10/14/17   [provider]  torsemide (DEMADEX) 100 MG tablet Take by mouth. 11/11/22 11/11/23  [provider]   Physical Exam: Vitals:   12/24/22 1400 12/24/22 1430 12/24/22 1500 12/24/22 1530  BP: (!) 110/57 (!) 118/59 (!) 130/58 (!) 127/55  Pulse: 87 88 86 81  Resp: (!) 25 20 19 18   Temp:      TempSrc:      SpO2: (!) 83% 100% 99% 100%  Weight:      Height:       Constitutional: appears weak, acutely ill Eyes: PERRL, lids  and conjunctivae normal ENMT: Mucous membranes are moist. Posterior pharynx clear of any exudate or lesions. Age-appropriate dentition. Hearing appropriate Neck: normal, supple, no masses, no thyromegaly Respiratory: clear to auscultation bilaterally, no wheezing, no crackles. Normal respiratory effort. No accessory muscle use.  Cardiovascular: Regular rate and rhythm, no murmurs / rubs / gallops. No extremity edema. 2+ pedal pulses. No carotid bruits.  Abdomen: Obese abdomen, no tenderness, no masses palpated, no hepatosplenomegaly. Bowel sounds positive.  Musculoskeletal: no clubbing / cyanosis. No joint deformity upper and lower extremities. Good ROM, no contractures, no atrophy. Normal muscle tone.  Skin: no rashes, lesions, ulcers. No induration Neurologic: Sensation intact. Strength 5/5 in all 4.  Psychiatric: Normal judgment and insight. Alert and oriented x 3. Normal mood.   EKG: independently reviewed, showing sinus tachycardia with rate of 109, QTc 507  Chest x-ray on Admission: I personally  reviewed and I agree with radiologist reading as below.  CT ABDOMEN PELVIS WO CONTRAST  Result Date: 12/24/2022 CLINICAL DATA:  Sepsis. Altered mental status. Weakness. Patient is not walking today, the way she used to. EXAM: CT ABDOMEN AND PELVIS WITHOUT CONTRAST TECHNIQUE: Multidetector CT imaging of the abdomen and pelvis was performed following the standard protocol without IV contrast. RADIATION DOSE REDUCTION: This exam was performed according to the departmental dose-optimization program which includes automated exposure control, adjustment of the mA and/or kV according to patient size and/or use of iterative reconstruction technique. COMPARISON:  CT angiography aorta runoff from 11/16/2021. FINDINGS: Lower chest: The lung bases are clear. No pleural effusion. The heart is mildly enlarged in size. No pericardial effusion. Partially seen cardiac pacemaker leads, prosthetic aortic valve and mitral annulus calcification. Hepatobiliary: The liver is normal in size. Non-cirrhotic configuration. No suspicious mass. There scattered punctate calcifications in the liver, likely sequela of prior granulomatous infection. No intrahepatic or extrahepatic bile duct dilation. No calcified gallstones. Normal gallbladder wall thickness. No pericholecystic inflammatory changes. Pancreas: Unremarkable. No pancreatic ductal dilatation or surrounding inflammatory changes. Spleen: Within normal limits. No focal lesion. Adrenals/Urinary Tract: Adrenal glands are unremarkable. No discrete focal renal mass seen within the limitations of this unenhanced study. No right hydronephrosis/hydroureter or nephroureterolithiasis. Vascular calcifications noted in the right kidney. No left nephroureterolithiasis. Vascular calcifications noted. There is mild fat stranding surrounding the left upper ureter without discrete obstructing mass or calculi. There is mild left perirenal fat stranding as well. No left hydroureteronephrosis.  Unremarkable urinary bladder. Stomach/Bowel: No disproportionate dilation of the small or large bowel loops. No evidence of abnormal bowel wall thickening or inflammatory changes. The appendix is unremarkable. Vascular/Lymphatic: No ascites or pneumoperitoneum. There is trace fat stranding in the left paracolic gutter. No abdominal or pelvic lymphadenopathy, by size criteria. No aneurysmal dilation of the major abdominal arteries. There are moderate peripheral atherosclerotic vascular calcifications of the aorta and its major branches. Reproductive: The uterus is unremarkable. No large adnexal mass. Other: There is a tiny fat containing umbilical hernia. The soft tissues and abdominal wall are otherwise unremarkable. Musculoskeletal: No suspicious osseous lesions. There are moderate multilevel degenerative changes in the visualized spine. Mild right and moderate-to-severe left hip degenerative changes noted. IMPRESSION: 1. Mild fat stranding surrounding the left upper ureter without discrete obstructing mass or calculi. Mild left perirenal fat stranding as well. Correlate with urinalysis for evidence of urinary tract infection. 2. No other acute findings in the abdomen or pelvis. 3. Moderate multilevel degenerative changes in the visualized spine. Mild right and moderate-to-severe left hip degenerative changes. Aortic Atherosclerosis (ICD10-I70.0).  Electronically Signed   By: Jules Schick M.D.   On: 12/24/2022 11:14   CT Head Wo Contrast  Result Date: 12/24/2022 CLINICAL DATA:  Mental status change, unknown cause EXAM: CT HEAD WITHOUT CONTRAST TECHNIQUE: Contiguous axial images were obtained from the base of the skull through the vertex without intravenous contrast. RADIATION DOSE REDUCTION: This exam was performed according to the departmental dose-optimization program which includes automated exposure control, adjustment of the mA and/or kV according to patient size and/or use of iterative reconstruction  technique. COMPARISON:  CT scan head from 10/16/2022. FINDINGS: Brain: No evidence of acute infarction, hemorrhage, hydrocephalus, extra-axial collection or mass lesion/mass effect. There is bilateral periventricular hypodensity, which is non-specific but most likely seen in the settings of microvascular ischemic changes. Mild in extent. Otherwise normal appearance of brain parenchyma. Ventricles are normal. Cerebral volume is age appropriate. Vascular: No hyperdense vessel or unexpected calcification. Intracranial arteriosclerosis. Skull: Normal. Negative for fracture or focal lesion. Sinuses/Orbits: No acute finding. Other: Visualized mastoid air cells are unremarkable. No mastoid effusion. IMPRESSION: 1. No acute intracranial abnormality. 2. Mild bilateral periventricular hypodensity, which is non-specific but most likely seen in the settings of microvascular ischemic changes. Electronically Signed   By: Jules Schick M.D.   On: 12/24/2022 11:04   DG Chest 2 View  Result Date: 12/24/2022 CLINICAL DATA:  Fever and weakness.  Suspected sepsis. EXAM: CHEST - 2 VIEW COMPARISON:  07/18/2022. FINDINGS: Low lung volume. Bilateral lung fields are clear. No acute consolidation or lung collapse. Bilateral costophrenic angles are clear. Stable cardio-mediastinal silhouette. There is a left sided 3-lead pacemaker. Prosthetic aortic valve noted. No acute osseous abnormalities. The soft tissues are within normal limits. IMPRESSION: 1. Low lung volume. No radiographic evidence of acute cardiopulmonary disease. Electronically Signed   By: Jules Schick M.D.   On: 12/24/2022 11:00    Labs on Admission: I have personally reviewed following labs CBC: Recent Labs  Lab 12/24/22 0917  WBC 23.5*  NEUTROABS 21.0*  HGB 10.3*  HCT 32.0*  MCV 89.1  PLT 228   Basic Metabolic Panel: Recent Labs  Lab 12/24/22 0917  NA 131*  K 3.9  CL 100  CO2 21*  GLUCOSE 105*  BUN 33*  CREATININE 2.48*  CALCIUM 8.7*    GFR: Estimated Creatinine Clearance: 19.6 mL/min (A) (by C-G formula based on SCr of 2.48 mg/dL (H)). Liver Function Tests: Recent Labs  Lab 12/24/22 0917  AST 19  ALT 10  ALKPHOS 61  BILITOT 1.4*  PROT 7.6  ALBUMIN 3.3*   Coagulation Profile: Recent Labs  Lab 12/24/22 0917  INR 1.4*   Urine analysis:    Component Value Date/Time   COLORURINE YELLOW (A) 12/24/2022 0917   APPEARANCEUR HAZY (A) 12/24/2022 0917   LABSPEC 1.013 12/24/2022 0917   PHURINE 5.0 12/24/2022 0917   GLUCOSEU >=500 (A) 12/24/2022 0917   HGBUR SMALL (A) 12/24/2022 0917   BILIRUBINUR NEGATIVE 12/24/2022 0917   KETONESUR NEGATIVE 12/24/2022 0917   PROTEINUR 30 (A) 12/24/2022 0917   NITRITE NEGATIVE 12/24/2022 0917   LEUKOCYTESUR SMALL (A) 12/24/2022 0917   CRITICAL CARE Performed by: Dr. Sedalia Muta  Total critical care time: 32 minutes  Critical care time was exclusive of separately billable procedures and treating other patients.  Critical care was necessary to treat or prevent imminent or life-threatening deterioration.  Critical care was time spent personally by me on the following activities: development of treatment plan with patient and/or surrogate as well as nursing, discussions with consultants,  evaluation of patient's response to treatment, examination of patient, obtaining history from patient or surrogate, ordering and performing treatments and interventions, ordering and review of laboratory studies, ordering and review of radiographic studies, pulse oximetry and re-evaluation of patient's condition.  This document was prepared using Dragon Voice Recognition software and may include unintentional dictation errors.  Dr. Sedalia Muta [facility]  If 7PM-7AM, please contact overnight-coverage provider If 7AM-7PM, please contact day attending provider [email or web address]  12/24/2022, 3:59 PM

## 2022-12-25 ENCOUNTER — Inpatient Hospital Stay: Payer: Medicare Other

## 2022-12-25 DIAGNOSIS — N1832 Chronic kidney disease, stage 3b: Secondary | ICD-10-CM

## 2022-12-25 DIAGNOSIS — A4151 Sepsis due to Escherichia coli [E. coli]: Principal | ICD-10-CM

## 2022-12-25 DIAGNOSIS — A419 Sepsis, unspecified organism: Secondary | ICD-10-CM | POA: Diagnosis not present

## 2022-12-25 DIAGNOSIS — R6520 Severe sepsis without septic shock: Secondary | ICD-10-CM | POA: Diagnosis not present

## 2022-12-25 DIAGNOSIS — E872 Acidosis, unspecified: Secondary | ICD-10-CM | POA: Insufficient documentation

## 2022-12-25 LAB — CBC
HCT: 26.4 % — ABNORMAL LOW (ref 36.0–46.0)
Hemoglobin: 8.8 g/dL — ABNORMAL LOW (ref 12.0–15.0)
MCH: 28.9 pg (ref 26.0–34.0)
MCHC: 33.3 g/dL (ref 30.0–36.0)
MCV: 86.6 fL (ref 80.0–100.0)
Platelets: 154 10*3/uL (ref 150–400)
RBC: 3.05 MIL/uL — ABNORMAL LOW (ref 3.87–5.11)
RDW: 14.1 % (ref 11.5–15.5)
WBC: 13.1 10*3/uL — ABNORMAL HIGH (ref 4.0–10.5)
nRBC: 0 % (ref 0.0–0.2)

## 2022-12-25 LAB — BASIC METABOLIC PANEL
Anion gap: 8 (ref 5–15)
BUN: 35 mg/dL — ABNORMAL HIGH (ref 8–23)
CO2: 19 mmol/L — ABNORMAL LOW (ref 22–32)
Calcium: 7.9 mg/dL — ABNORMAL LOW (ref 8.9–10.3)
Chloride: 106 mmol/L (ref 98–111)
Creatinine, Ser: 2.2 mg/dL — ABNORMAL HIGH (ref 0.44–1.00)
GFR, Estimated: 22 mL/min — ABNORMAL LOW (ref >60)
Glucose, Bld: 93 mg/dL (ref 70–99)
Potassium: 3.9 mmol/L (ref 3.5–5.1)
Sodium: 133 mmol/L — ABNORMAL LOW (ref 135–145)

## 2022-12-25 MED ORDER — SODIUM CHLORIDE 0.9 % IV SOLN
2.0000 g | INTRAVENOUS | Status: DC
  Administered 2022-12-25 – 2022-12-28 (×4): 2 g via INTRAVENOUS
  Filled 2022-12-25 (×4): qty 20

## 2022-12-25 MED ORDER — OXYCODONE HCL 5 MG PO TABS
5.0000 mg | ORAL_TABLET | Freq: Four times a day (QID) | ORAL | Status: DC | PRN
  Administered 2022-12-25 – 2022-12-27 (×4): 5 mg via ORAL
  Filled 2022-12-25 (×4): qty 1

## 2022-12-25 MED ORDER — STERILE WATER FOR INJECTION IV SOLN
INTRAVENOUS | Status: AC
  Filled 2022-12-25 (×2): qty 150
  Filled 2022-12-25: qty 1000

## 2022-12-25 NOTE — Progress Notes (Signed)
Patient transferred via bed to room 204. CN at bedside to receive patient. Family at bedside with patient. Belongings moved with patient.

## 2022-12-25 NOTE — Plan of Care (Signed)
[facility] Tonsillectomy/Adenoidectomy, Postoperative PEDS care plan entered in error.

## 2022-12-25 NOTE — TOC Progression Note (Signed)
Transition of Care [facility] - Progression Note    Patient Details  Name: Rebecca Gilmore MRN: [id number] Date of Birth: 1940/11/26  Transition of Care [facility]) CM/SW Contact  Truddie Hidden, RN Phone Number: 12/25/2022, 11:45 AM  Clinical Narrative:     TOC continuing to follow patient's progress throughout discharge planning.        Expected Discharge Plan and Services                                               Social Determinants of Health (SDOH) Interventions SDOH Screenings   Food Insecurity: No Food Insecurity (12/24/2022)  Housing: Low Risk  (12/24/2022)  Transportation Needs: No Transportation Needs (12/24/2022)  Utilities: Not At Risk (12/24/2022)  Financial Resource Strain: Low Risk  (03/07/2021)   Received from [facility], [facility]  Tobacco Use: Medium Risk (12/24/2022)    Readmission Risk Interventions    12/24/2022    4:02 PM  Readmission Risk Prevention Plan  Transportation Screening Complete  HRI or Home Care Consult Complete  SW Recovery Care/Counseling Consult Complete  Palliative Care Screening Not Applicable  Skilled Nursing Facility Not Applicable

## 2022-12-25 NOTE — Progress Notes (Signed)
Progress Note   Patient: Rebecca Gilmore XBM:[id number] DOB: 10/19/1940 DOA: 12/24/2022     1 DOS: the patient was seen and examined on 12/25/2022   Brief hospital course: [name] is a 82 year old female with history of non-insulin-dependent diabetes mellitus, hyperlipidemia, depression, who presents emergency department for chief concerns of altered mental status. Upon arriving hospital, patient was hypotensive, received 3 L fluid bolus. CT abdomen pelvis wo contrast: Was read as mild fat stranding surrounding the left upper ureter without discrete obstructing mass or calculi.  Mild left perirenal fat stranding as well.  Correlate with urine analysis for evidence of urinary tract infection.  Patient was placed on Rocephin for urinary tract infection.   Principal Problem:   Severe sepsis with acute organ dysfunction (HCC) Active Problems:   AKI (acute kidney injury) (HCC)   Coronary artery disease   Iron deficiency anemia   Anemia of chronic renal failure, stage 3b (HCC)   HLD (hyperlipidemia)   Depression   CKD stage 3b, GFR 30-44 ml/min (HCC)   Leukocytosis   Left leg pain   Morbid obesity (HCC)   E. coli septicemia (HCC)   Metabolic acidosis   Assessment and Plan: * Severe sepsis with acute organ dysfunction (HCC) Urinary tract infection. E. coli septicemia. Acute metabolic encephalopathy secondary to sepsis. Had a significant hypotension from admission, she also had altered mental status.  Also met sepsis criteria with severe leukocytosis, tachycardia and tachypnea.  Lactic acid 1.6. This is thought to be secondary to urinary tract infection, urine culture sent out.  Blood culture came back with E. coli.  Continue Rocephin. Patient blood pressure is more stable today, will transfer out of ICU.  AKI (acute kidney injury) (HCC) ruled out. Chronic kidney disease stage IIIb. Hyponatremia. Metabolic acidosis. Renal function appears to be stable compared to  baseline.  Continue some IV fluids for metabolic acidosis with bicarb drip.  Follow.  Iron deficient anemia. B12 deficient anemia. Patient recent iron study showed a mild iron deficiency, on iron supplement.  Patient is also on B12 supplement.  Coronary artery disease Resumed home Plavix 75 mg daily, rosuvastatin 5 mg daily  HLD (hyperlipidemia) Home rosuvastatin 5 mg nightly resumed  Depression [facility] home medicines.  Morbid obesity (HCC) BMI 36.14 with comorbidities.  Diet exercise advised.  Peripheral arterial disease with recent intervention. Recent ABI performed on 12/08/2022 showed distal PVD.  Weakness and frequent falls. Spoke with the had frequent falls, had a hard fall in August, still complaining of pain in the left hip left elbow and left shoulder.  Will obtain x-rays.     Subjective:  Patient feeling much better today, no fever or chills.  No dysuria or hematuria.  Physical Exam: Vitals:   12/25/22 1036 12/25/22 1045 12/25/22 1100 12/25/22 1200  BP: 104/61 (!) 86/64 (!) 121/41 (!) 105/45  Pulse:  81 83 73  Resp: (!) 24 (!) 22 17 (!) 25  Temp:      TempSrc:      SpO2:  99% 100% 97%  Weight:      Height:       General exam: Appears calm and comfortable  Respiratory system: Clear to auscultation. Respiratory effort normal. Cardiovascular system: S1 & S2 heard, RRR. No JVD, murmurs, rubs, gallops or clicks. No pedal edema. Gastrointestinal system: Abdomen is nondistended, soft and nontender. No organomegaly or masses felt. Normal bowel sounds heard. Central nervous system: Alert and oriented x2. No focal neurological deficits. Extremities: Symmetric 5 x 5  power. Skin: No rashes, lesions or ulcers Psychiatry: Judgement and insight appear normal. Mood & affect appropriate.    Data Reviewed:  Review lab results, CT scan results.  Prior ABI results.  Family Communication: daughter updated  Disposition: Status is: Inpatient Remains inpatient appropriate  because: Disease, IV treatment.     Time spent: 55 minutes  Author: Marrion Coy, MD 12/25/2022 1:09 PM  For on call review [email or web address].

## 2022-12-25 NOTE — Plan of Care (Signed)
  Problem: Education: Goal: Understanding of CV disease, CV risk reduction, and recovery process will improve Outcome: Progressing Goal: Individualized Educational Video(s) Outcome: Progressing   Problem: Activity: Goal: Ability to return to baseline activity level will improve Outcome: Progressing   Problem: Cardiovascular: Goal: Ability to achieve and maintain adequate cardiovascular perfusion will improve Outcome: Progressing Goal: Vascular access site(s) Level 0-1 will be maintained Outcome: Progressing   Problem: Health Behavior/Discharge Planning: Goal: Ability to safely manage health-related needs after discharge will improve Outcome: Progressing   Problem: Fluid Volume: Goal: Hemodynamic stability will improve Outcome: Progressing   Problem: Clinical Measurements: Goal: Diagnostic test results will improve Outcome: Progressing Goal: Signs and symptoms of infection will decrease Outcome: Progressing   Problem: Respiratory: Goal: Ability to maintain adequate ventilation will improve Outcome: Progressing   Problem: Education: Goal: Knowledge of General Education information will improve Description: Including pain rating scale, medication(s)/side effects and non-pharmacologic comfort measures Outcome: Progressing   Problem: Health Behavior/Discharge Planning: Goal: Ability to manage health-related needs will improve Outcome: Progressing   Problem: Clinical Measurements: Goal: Ability to maintain clinical measurements within normal limits will improve Outcome: Progressing Goal: Will remain free from infection Outcome: Progressing Goal: Diagnostic test results will improve Outcome: Progressing Goal: Respiratory complications will improve Outcome: Progressing Goal: Cardiovascular complication will be avoided Outcome: Progressing   Problem: Activity: Goal: Risk for activity intolerance will decrease Outcome: Progressing   Problem: Nutrition: Goal: Adequate  nutrition will be maintained Outcome: Progressing   Problem: Coping: Goal: Level of anxiety will decrease Outcome: Progressing   Problem: Elimination: Goal: Will not experience complications related to bowel motility Outcome: Progressing Goal: Will not experience complications related to urinary retention Outcome: Progressing   Problem: Pain Managment: Goal: General experience of comfort will improve Outcome: Progressing   Problem: Safety: Goal: Ability to remain free from injury will improve Outcome: Progressing   Problem: Skin Integrity: Goal: Risk for impaired skin integrity will decrease Outcome: Progressing   

## 2022-12-26 DIAGNOSIS — N1832 Chronic kidney disease, stage 3b: Secondary | ICD-10-CM | POA: Diagnosis not present

## 2022-12-26 DIAGNOSIS — R6520 Severe sepsis without septic shock: Secondary | ICD-10-CM | POA: Diagnosis not present

## 2022-12-26 DIAGNOSIS — A4151 Sepsis due to Escherichia coli [E. coli]: Secondary | ICD-10-CM | POA: Diagnosis not present

## 2022-12-26 DIAGNOSIS — A419 Sepsis, unspecified organism: Secondary | ICD-10-CM | POA: Diagnosis not present

## 2022-12-26 LAB — CBC
HCT: 31.8 % — ABNORMAL LOW (ref 36.0–46.0)
Hemoglobin: 10.4 g/dL — ABNORMAL LOW (ref 12.0–15.0)
MCH: 28.6 pg (ref 26.0–34.0)
MCHC: 32.7 g/dL (ref 30.0–36.0)
MCV: 87.4 fL (ref 80.0–100.0)
Platelets: 172 10*3/uL (ref 150–400)
RBC: 3.64 MIL/uL — ABNORMAL LOW (ref 3.87–5.11)
RDW: 14.3 % (ref 11.5–15.5)
WBC: 7.7 10*3/uL (ref 4.0–10.5)
nRBC: 0 % (ref 0.0–0.2)

## 2022-12-26 LAB — BASIC METABOLIC PANEL
Anion gap: 9 (ref 5–15)
BUN: 32 mg/dL — ABNORMAL HIGH (ref 8–23)
CO2: 21 mmol/L — ABNORMAL LOW (ref 22–32)
Calcium: 8 mg/dL — ABNORMAL LOW (ref 8.9–10.3)
Chloride: 104 mmol/L (ref 98–111)
Creatinine, Ser: 1.98 mg/dL — ABNORMAL HIGH (ref 0.44–1.00)
GFR, Estimated: 25 mL/min — ABNORMAL LOW (ref >60)
Glucose, Bld: 87 mg/dL (ref 70–99)
Potassium: 3.7 mmol/L (ref 3.5–5.1)
Sodium: 134 mmol/L — ABNORMAL LOW (ref 135–145)

## 2022-12-26 LAB — MAGNESIUM: Magnesium: 1.6 mg/dL — ABNORMAL LOW (ref 1.7–2.4)

## 2022-12-26 LAB — URINE CULTURE: Culture: 60000 — AB

## 2022-12-26 MED ORDER — MAGNESIUM SULFATE 2 GM/50ML IV SOLN
2.0000 g | Freq: Once | INTRAVENOUS | Status: AC
  Administered 2022-12-26: 2 g via INTRAVENOUS
  Filled 2022-12-26: qty 50

## 2022-12-26 NOTE — Evaluation (Addendum)
Physical Therapy Evaluation Patient Details Name: Rebecca Gilmore MRN: [id number] DOB: 27-Feb-1941 Today's Date: 12/26/2022  History of Present Illness  Patient is a 82 year old female with history of non-insulin-dependent diabetes mellitus, hyperlipidemia, depression, who presents emergency department for chief concerns of altered mental status. Current MD assessment:   Severe sepsis with acute organ dysfunction, Acute metabolic encephalopathy secondary to sepsis, and AKI.  Clinical Impression  Pt was pleasant and willing to participate during the session and put forth good effort throughout. Pt is currently +2 Max A for bed mobility secondary to pt's pain and inability to functionally move LLE. Once seated EOB pt needing HHA to sit upright but once there able to support herself. She is +2 Min A from an elevated surface when performing STS with RW. VC's provided for hand placement and sequencing. Once standing pt is able to statically stand with RW while being CGA, apprehensive to bear weight throughout LLE but able to tolerate standing for ~3 min. When attempting to bear weight through LLE pt needing Min A for stabilize, and VC's for support via BUE's on RW, able to slightly shuffle LE's with minimal foot clearance; no true functional steps taken. VSS throughout session. Overall session limited by pt reports of severe pain with movement. Pt will benefit from continued PT services upon discharge to safely address deficits listed in patient problem list for decreased caregiver assistance and eventual return to PLOF.          If plan is discharge home, recommend the following: Two people to help with walking and/or transfers;Two people to help with bathing/dressing/bathroom;Help with stairs or ramp for entrance;Assist for transportation   Can travel by private vehicle   No    Equipment Recommendations Other (comment) (TBD)  Recommendations for Other Services       Functional Status Assessment  Patient has had a recent decline in their functional status and demonstrates the ability to make significant improvements in function in a reasonable and predictable amount of time.     Precautions / Restrictions Precautions Precautions: Fall Restrictions Weight Bearing Restrictions: No      Mobility  Bed Mobility Overal bed mobility: Needs Assistance Bed Mobility: Supine to Sit, Sit to Supine     Supine to sit: +2 for physical assistance, +2 for safety/equipment, Max assist Sit to supine: +2 for physical assistance, +2 for safety/equipment, Max assist   General bed mobility comments: Very painful +2 for management    Transfers Overall transfer level: Needs assistance Equipment used: Rolling walker (2 wheels) Transfers: Sit to/from Stand Sit to Stand: +2 physical assistance, Min assist, From elevated surface           General transfer comment: From elevated surface, pt needing cues and encouragement for sequencing and forward lean.    Ambulation/Gait Ambulation/Gait assistance: Min assist   Assistive device: Rolling walker (2 wheels) Gait Pattern/deviations: Shuffle       General Gait Details: pt able to tolerate a few very limited shuffles within the space of the RW, no true functinal steps able to be done despite cues. Needing Min A when attempting to weight shift in standing, otherwise CGA with static standing. Able to tolerate ~3 mins before needing to sit back down.  Stairs            Wheelchair Mobility     Tilt Bed    Modified Rankin (Stroke Patients Only)       Balance Overall balance assessment: Needs assistance   Sitting balance-Leahy Scale:  Fair Sitting balance - Comments: Initial R sided lean, needing HHA to sit upright, once there pt was stable Postural control: Right lateral lean Standing balance support: Single extremity supported, During functional activity Standing balance-Leahy Scale: Fair Standing balance comment: Static  standing                             Pertinent Vitals/Pain Pain Assessment Pain Assessment: 0-10 Pain Score: 7  Pain Location: Generalized; severe pain in L knee and hip Pain Descriptors / Indicators: Constant, Grimacing, Guarding, Sore, Discomfort, Aching Pain Intervention(s): Limited activity within patient's tolerance, Monitored during session    Home Living Family/patient expects to be discharged to:: Private residence Living Arrangements: Spouse/significant other Available Help at Discharge: Family;Available 24 hours/day;Available PRN/intermittently Type of Home: House Home Access: Stairs to enter Entrance Stairs-Rails: None Entrance Stairs-Number of Steps: 1, landing then +1     Home Equipment: Rolling Walker (2 wheels);Rollator (4 wheels);Grab bars - tub/shower;Toilet riser;BSC/3in1 Additional Comments: Live with husband (has dementia), grandson next door    Prior Function Prior Level of Function : Needs assist;Independent/Modified Independent             Mobility Comments: Hs only been able to take a few steps at most for the past few months with a Rollator before sitting down, very pain limited, Grandson helps as needed. Pt endorses multiple falls, does not know how long ago but mentions having +5 in one week. Reports rollator gives her problems often (likely not safe to use). ADLs Comments: Grossly Mod I for bathing and toileting, does not leave the house often     Extremity/Trunk Assessment   Upper Extremity Assessment Upper Extremity Assessment: Generalized weakness    Lower Extremity Assessment Lower Extremity Assessment: Generalized weakness;LLE deficits/detail LLE Deficits / Details: Immense guarding and apprehension to move leg in bed or bear weight through it. LLE: Unable to fully assess due to pain       Communication   Communication Communication: No apparent difficulties  Cognition Arousal: Alert Behavior During Therapy: WFL for  tasks assessed/performed Overall Cognitive Status: Within Functional Limits for tasks assessed                                          General Comments      Exercises Other Exercises Other Exercises: Edu. on pain management techniques and benefits of frequent mobilization to maintain functional mobility and strength   Assessment/Plan    PT Assessment Patient needs continued PT services  PT Problem List Decreased strength;Decreased coordination;Decreased range of motion;Decreased activity tolerance;Decreased balance;Decreased mobility;Pain       PT Treatment Interventions DME instruction;Balance training;Gait training;Functional mobility training;Stair training;Therapeutic activities;Therapeutic exercise    PT Goals (Current goals can be found in the Care Plan section)  Acute Rehab PT Goals Patient Stated Goal: walk around more PT Goal Formulation: With patient Time For Goal Achievement: 01/08/23 Potential to Achieve Goals: Fair    Frequency Min 1X/week     Co-evaluation               AM-PAC PT "6 Clicks" Mobility  Outcome Measure Help needed turning from your back to your side while in a flat bed without using bedrails?: A Lot Help needed moving from lying on your back to sitting on the side of a flat bed without using bedrails?: A Lot  Help needed moving to and from a bed to a chair (including a wheelchair)?: A Lot Help needed standing up from a chair using your arms (e.g., wheelchair or bedside chair)?: A Little Help needed to walk in hospital room?: A Lot Help needed climbing 3-5 steps with a railing? : Total 6 Click Score: 12    End of Session Equipment Utilized During Treatment: Gait belt Activity Tolerance: Patient limited by pain Patient left: in bed;with call bell/phone within reach;with bed alarm set Nurse Communication: Mobility status PT Visit Diagnosis: Other abnormalities of gait and mobility (R26.89);Pain Pain - Right/Left: Left  (general) Pain - part of body: Knee;Hip    Time: 1308-6578 PT Time Calculation (min) (ACUTE ONLY): 42 min   Charges:                 Cecile Sheerer, SPT 12/26/22, 11:21 AM   This entire session was performed under direct supervision and direction of a licensed therapist/therapist assistant. I have personally read, edited and approve of the note as written.  Loran Senters, DPT

## 2022-12-26 NOTE — Evaluation (Signed)
Occupational Therapy Evaluation Patient Details Name: Rebecca Gilmore MRN: [id number] DOB: 25-Aug-1940 Today's Date: 12/26/2022   History of Present Illness Patient is a 82 year old female with history of non-insulin-dependent diabetes mellitus, hyperlipidemia, depression, who presents emergency department for chief concerns of altered mental status. Current MD assessment:   Severe sepsis with acute organ dysfunction, Acute metabolic encephalopathy secondary to sepsis, and AKI.   Clinical Impression   Pt was seen for OT evaluation this date. Prior to hospital admission, pt was generally MOD I with ADLs, taking a sink bath with PRN assist, requiring assistance for IADLs. Pt daughter reports pt sitting on rollator to roll around her house, limited ambulation. During eval Pt was alert and oriented x3 to self, time, and place. Pt lives in home with husband with grandson next door to assist as needed.  Pt endorses multiple falls, does not know how long ago but mentions having +5 in one week.   Pt presents to acute OT demonstrating impaired ADL performance and functional mobility 2/2 decreased strength, endurance, cognition, activity tolerance (See OT problem list for additional functional deficits). Pt currently requires MAX A +2 for bed mobility and increased time due to L hip arthritic pain. During STS Pt required MIN A + RW. Pt tolerated taking ~ amb 101ft (forward/side steps) before t/f to recliner. Pt reports increased pain in L hip and needing to rest. Nurse present during vitals; BP 96/57 (MAP 64), HR 70, Spo2 98%. Pt is motivated to return home. Pt would benefit from skilled OT services to address noted impairments and functional limitations (see below for any additional details) in order to maximize safety and independence while minimizing falls risk and caregiver burden. OT will follow acutely.       If plan is discharge home, recommend the following: A little help with walking and/or  transfers;Assistance with cooking/housework;Assist for transportation;A little help with bathing/dressing/bathroom;Help with stairs or ramp for entrance;Direct supervision/assist for medications management    Functional Status Assessment  Patient has had a recent decline in their functional status and demonstrates the ability to make significant improvements in function in a reasonable and predictable amount of time.  Equipment Recommendations  BSC/3in1;Other (comment) (next venue of care)    Recommendations for Other Services       Precautions / Restrictions Precautions Precautions: Fall Restrictions Weight Bearing Restrictions: No      Mobility Bed Mobility Overal bed mobility: Needs Assistance Bed Mobility: Supine to Sit     Supine to sit: Max assist, +2 for physical assistance     General bed mobility comments: Truck and LE management    Transfers Overall transfer level: Needs assistance Equipment used: Rolling walker (2 wheels) Transfers: Sit to/from Stand Sit to Stand: Min assist (slightly elevated (Pt has lift chair at home))                  Balance Overall balance assessment: Needs assistance Sitting-balance support: Feet supported Sitting balance-Leahy Scale: Fair Sitting balance - Comments: Initial R sided lean Postural control: Right lateral lean Standing balance support: During functional activity, Bilateral upper extremity supported Standing balance-Leahy Scale: Poor                             ADL either performed or assessed with clinical judgement   ADL Overall ADL's : Needs assistance/impaired Eating/Feeding: Supervision/ safety                Toileting: anticipate  MAX A        Toilet Transfer: Minimal assistance;Rolling walker (2 wheels);Ambulation Toilet Transfer Details (indicate cue type and reason): simulated toilet t/f from EOB to Recliner         Functional mobility during ADLs: Minimal assistance;Rolling  walker (2 wheels) General ADL Comments: Pt seemed fearful of movement due to level of pain     Vision Baseline Vision/History: 0 No visual deficits       Perception         Praxis         Pertinent Vitals/Pain Pain Assessment Pain Assessment: Faces Faces Pain Scale: Hurts even more Pain Location: Generalized; severe pain in L hip Pain Descriptors / Indicators: Constant, Grimacing, Guarding, Sore, Discomfort, Aching Pain Intervention(s): Monitored during session, Repositioned, Limited activity within patient's tolerance     Extremity/Trunk Assessment Upper Extremity Assessment Upper Extremity Assessment: Generalized weakness   Lower Extremity Assessment Lower Extremity Assessment: Generalized weakness LLE Deficits / Details: Immense guarding and apprehension to move leg in bed or bear weight through it.       Communication Communication Communication: No apparent difficulties Cueing Techniques: Verbal cues;Tactile cues   Cognition Arousal: Alert Behavior During Therapy: WFL for tasks assessed/performed Overall Cognitive Status: Impaired/Different from baseline Area of Impairment: Memory, Orientation, Attention                 Orientation Level: Disoriented to, Situation (Grossly orientated to situation) Current Attention Level: Sustained Memory: Decreased short-term memory               General Comments  Noted breakdown in skin fold - L hip very painful for Pt on this date. Nurse in room/made aware of opening/pain    Exercises Other Exercises Other Exercises: Edu: Role of OT, benefits of rehab post d/c, Safe ADL completion, energy conservation   Shoulder Instructions      Home Living Family/patient expects to be discharged to:: Private residence Living Arrangements: Spouse/significant other Available Help at Discharge: Family;Available 24 hours/day;Available PRN/intermittently Type of Home: House Home Access: Stairs to enter [employer] of Steps: 1, landing then +1 Entrance Stairs-Rails: None Home Layout: One level     Bathroom Shower/Tub: Producer, television/film/video: Standard     Home Equipment: Agricultural consultant (2 wheels);Rollator (4 wheels);Grab bars - tub/shower;Toilet riser;BSC/3in1   Additional Comments: Live with husband (has dementia), grandson next door      Prior Functioning/Environment Prior Level of Function : Needs assist;Independent/Modified Independent             Mobility Comments: Has only been able to take a few steps at most for the past few months with a Rollator before sitting down, very pain limited, Grandson helps as needed. Pt endorses multiple falls, does not know how long ago but mentions having +5 in one week. Reports rollator gives her problems often (likely not safe to use). ADLs Comments: Grossly Mod I, assist as needed, assist for IADLs, does not leave the house often        OT Problem List: Decreased strength;Decreased knowledge of use of DME or AE;Decreased activity tolerance;Decreased cognition;Decreased safety awareness;Impaired balance (sitting and/or standing);Pain;Decreased range of motion      OT Treatment/Interventions: Self-care/ADL training;Therapeutic exercise;Energy conservation;Therapeutic activities;Patient/family education;DME and/or AE instruction    OT Goals(Current goals can be found in the care plan section) Acute Rehab OT Goals Patient Stated Goal: to return to PLOF OT Goal Formulation: With patient/family Time For Goal Achievement: 01/09/23 Potential to Achieve  Goals: Good ADL Goals Pt Will Perform Grooming: with modified independence;standing Pt Will Perform Lower Body Dressing: with modified independence;sit to/from stand Pt Will Transfer to Toilet: with modified independence;ambulating Pt Will Perform Toileting - Clothing Manipulation and hygiene: with modified independence;sit to/from stand  OT Frequency: Min 1X/week     Co-evaluation              AM-PAC OT "6 Clicks" Daily Activity     Outcome Measure Help from another person eating meals?: None Help from another person taking care of personal grooming?: A Little Help from another person toileting, which includes using toliet, bedpan, or urinal?: A Lot Help from another person bathing (including washing, rinsing, drying)?: A Lot Help from another person to put on and taking off regular upper body clothing?: None Help from another person to put on and taking off regular lower body clothing?: A Lot 6 Click Score: 17   End of Session Equipment Utilized During Treatment: Gait belt;Rolling walker (2 wheels) Nurse Communication: Mobility status (Nurse present for vitals)  Activity Tolerance: Patient tolerated treatment well Patient left: in chair;with call bell/phone within reach;with chair alarm set;with family/visitor present  OT Visit Diagnosis: Other abnormalities of gait and mobility (R26.89);Muscle weakness (generalized) (M62.81);Repeated falls (R29.6);Unsteadiness on feet (R26.81)                Time: 4098-1191 OT Time Calculation (min): 31 min Charges:  OT General Charges $OT Visit: 1 Visit OT Evaluation $OT Eval Moderate Complexity: 1 Mod  [employer], OTS

## 2022-12-26 NOTE — Progress Notes (Signed)
Progress Note   Patient: Rebecca Gilmore KZS:[id number] DOB: [birth year] DOA: 12/24/2022     2 DOS: the patient was seen and examined on 12/26/2022   Brief hospital course: [name] is a 82 year old female with history of non-insulin-dependent diabetes mellitus, hyperlipidemia, depression, who presents emergency department for chief concerns of altered mental status. Upon arriving hospital, patient was hypotensive, received 3 L fluid bolus. CT abdomen pelvis wo contrast: Was read as mild fat stranding surrounding the left upper ureter without discrete obstructing mass or calculi.  Mild left perirenal fat stranding as well.  Correlate with urine analysis for evidence of urinary tract infection.  Patient was placed on Rocephin for urinary tract infection. Blood culture positive for E. Coli.   Principal Problem:   Severe sepsis with acute organ dysfunction (HCC) Active Problems:   AKI (acute kidney injury) (HCC)   Coronary artery disease   Iron deficiency anemia   Anemia of chronic renal failure, stage 3b (HCC)   HLD (hyperlipidemia)   Depression   CKD stage 3b, GFR 30-44 ml/min (HCC)   Leukocytosis   Left leg pain   Morbid obesity (HCC)   E. coli septicemia (HCC)   Metabolic acidosis   Assessment and Plan: * Severe sepsis with acute organ dysfunction (HCC) Urinary tract infection. E. coli septicemia. Acute metabolic encephalopathy secondary to sepsis. Had a significant hypotension from admission, she also had altered mental status.  Also met sepsis criteria with severe leukocytosis, tachycardia and tachypnea.  Lactic acid 1.6. This is thought to be secondary to urinary tract infection, urine culture sent out.  Blood culture came back with E. coli.  Continue Rocephin. Urine culture actually positive for Citrobacter.  But bacteremia still considered caused by UTI.  Continue high-dose Rocephin for another day.   AKI (acute kidney injury) (HCC) ruled out. Chronic kidney  disease stage IIIb. Hyponatremia. Metabolic acidosis. Hypomagnesemia. Renal function still stable, replete magnesium today.   Iron deficient anemia. B12 deficient anemia. Patient recent iron study showed a mild iron deficiency, on iron supplement.  Patient is also on B12 supplement.   Coronary artery disease Resumed home Plavix 75 mg daily, rosuvastatin 5 mg daily   HLD (hyperlipidemia) Home rosuvastatin 5 mg nightly resumed   Depression Zoom home medicines.   Morbid obesity (HCC) BMI 36.14 with comorbidities.  Diet exercise advised.   Peripheral arterial disease with recent intervention. Recent ABI performed on 12/08/2022 showed distal PVD.   Weakness and frequent falls. Spoke with the had frequent falls, had a hard fall in August, still complaining of pain in the left hip left elbow and left shoulder.  X-rays showed arthritis without fractures.      Subjective:  Patient doing better today, no fever or chills.  Physical Exam: Vitals:   12/25/22 1505 12/25/22 1945 12/26/22 0310 12/26/22 0926  BP: 91/61 118/62 (!) 101/59 (!) 124/59  Pulse:  84 70 71  Resp:  20 20 16   Temp:  98.4 F (36.9 C) 97.6 F (36.4 C) 97.6 F (36.4 C)  TempSrc:  Oral Oral Oral  SpO2:  100% 97% 100%  Weight:      Height:       General exam: Appears calm and comfortable, morbid obese. Respiratory system: Clear to auscultation. Respiratory effort normal. Cardiovascular system: S1 & S2 heard, RRR. No JVD, murmurs, rubs, gallops or clicks. No pedal edema. Gastrointestinal system: Abdomen is nondistended, soft and nontender. No organomegaly or masses felt. Normal bowel sounds heard. Central nervous system: Alert  and oriented. No focal neurological deficits. Extremities: Symmetric 5 x 5 power. Skin: No rashes, lesions or ulcers Psychiatry: Judgement and insight appear normal. Mood & affect appropriate.    Data Reviewed:  Lab results x-rays reviewed.  Family Communication:  None  Disposition: Status is: Inpatient Remains inpatient appropriate because: Severity of disease, IV antibiotics.     Time spent: 35 minutes  Author: Marrion Coy, MD 12/26/2022 12:54 PM  For on call review [email or web address].

## 2022-12-26 NOTE — Plan of Care (Signed)
  Problem: Education: Goal: Understanding of CV disease, CV risk reduction, and recovery process will improve Outcome: Progressing Goal: Individualized Educational Video(s) Outcome: Progressing   Problem: Activity: Goal: Ability to return to baseline activity level will improve Outcome: Progressing   Problem: Cardiovascular: Goal: Ability to achieve and maintain adequate cardiovascular perfusion will improve Outcome: Progressing Goal: Vascular access site(s) Level 0-1 will be maintained Outcome: Progressing   Problem: Health Behavior/Discharge Planning: Goal: Ability to safely manage health-related needs after discharge will improve Outcome: Progressing   Problem: Fluid Volume: Goal: Hemodynamic stability will improve Outcome: Progressing   Problem: Clinical Measurements: Goal: Diagnostic test results will improve Outcome: Progressing Goal: Signs and symptoms of infection will decrease Outcome: Progressing   Problem: Respiratory: Goal: Ability to maintain adequate ventilation will improve Outcome: Progressing   Problem: Education: Goal: Knowledge of General Education information will improve Description: Including pain rating scale, medication(s)/side effects and non-pharmacologic comfort measures Outcome: Progressing   Problem: Health Behavior/Discharge Planning: Goal: Ability to manage health-related needs will improve Outcome: Progressing   Problem: Clinical Measurements: Goal: Ability to maintain clinical measurements within normal limits will improve Outcome: Progressing Goal: Will remain free from infection Outcome: Progressing Goal: Diagnostic test results will improve Outcome: Progressing Goal: Respiratory complications will improve Outcome: Progressing Goal: Cardiovascular complication will be avoided Outcome: Progressing   Problem: Activity: Goal: Risk for activity intolerance will decrease Outcome: Progressing   Problem: Nutrition: Goal: Adequate  nutrition will be maintained Outcome: Progressing   Problem: Coping: Goal: Level of anxiety will decrease Outcome: Progressing   Problem: Elimination: Goal: Will not experience complications related to bowel motility Outcome: Progressing Goal: Will not experience complications related to urinary retention Outcome: Progressing   Problem: Pain Managment: Goal: General experience of comfort will improve Outcome: Progressing   Problem: Safety: Goal: Ability to remain free from injury will improve Outcome: Progressing   Problem: Skin Integrity: Goal: Risk for impaired skin integrity will decrease Outcome: Progressing   

## 2022-12-27 DIAGNOSIS — A419 Sepsis, unspecified organism: Secondary | ICD-10-CM | POA: Diagnosis not present

## 2022-12-27 DIAGNOSIS — A4151 Sepsis due to Escherichia coli [E. coli]: Secondary | ICD-10-CM | POA: Diagnosis not present

## 2022-12-27 DIAGNOSIS — N1832 Chronic kidney disease, stage 3b: Secondary | ICD-10-CM | POA: Diagnosis not present

## 2022-12-27 DIAGNOSIS — R6520 Severe sepsis without septic shock: Secondary | ICD-10-CM | POA: Diagnosis not present

## 2022-12-27 LAB — CULTURE, BLOOD (ROUTINE X 2): Special Requests: ADEQUATE

## 2022-12-27 LAB — MAGNESIUM: Magnesium: 2 mg/dL (ref 1.7–2.4)

## 2022-12-27 LAB — BASIC METABOLIC PANEL
Anion gap: 7 (ref 5–15)
BUN: 27 mg/dL — ABNORMAL HIGH (ref 8–23)
CO2: 24 mmol/L (ref 22–32)
Calcium: 7.9 mg/dL — ABNORMAL LOW (ref 8.9–10.3)
Chloride: 103 mmol/L (ref 98–111)
Creatinine, Ser: 1.6 mg/dL — ABNORMAL HIGH (ref 0.44–1.00)
GFR, Estimated: 32 mL/min — ABNORMAL LOW (ref >60)
Glucose, Bld: 90 mg/dL (ref 70–99)
Potassium: 3.9 mmol/L (ref 3.5–5.1)
Sodium: 134 mmol/L — ABNORMAL LOW (ref 135–145)

## 2022-12-27 LAB — CBC
HCT: 29 % — ABNORMAL LOW (ref 36.0–46.0)
Hemoglobin: 9.4 g/dL — ABNORMAL LOW (ref 12.0–15.0)
MCH: 28.7 pg (ref 26.0–34.0)
MCHC: 32.4 g/dL (ref 30.0–36.0)
MCV: 88.4 fL (ref 80.0–100.0)
Platelets: 182 10*3/uL (ref 150–400)
RBC: 3.28 MIL/uL — ABNORMAL LOW (ref 3.87–5.11)
RDW: 14.3 % (ref 11.5–15.5)
WBC: 5 10*3/uL (ref 4.0–10.5)
nRBC: 0 % (ref 0.0–0.2)

## 2022-12-27 LAB — PROCALCITONIN: Procalcitonin: 11.32 ng/mL

## 2022-12-27 MED ORDER — LIDOCAINE 5 % EX PTCH
1.0000 | MEDICATED_PATCH | CUTANEOUS | Status: DC
  Administered 2022-12-27: 1 via TRANSDERMAL
  Filled 2022-12-27: qty 1

## 2022-12-27 NOTE — Plan of Care (Signed)
  Problem: Education: Goal: Understanding of CV disease, CV risk reduction, and recovery process will improve Outcome: Progressing Goal: Individualized Educational Video(s) Outcome: Progressing   Problem: Activity: Goal: Ability to return to baseline activity level will improve Outcome: Progressing   Problem: Cardiovascular: Goal: Ability to achieve and maintain adequate cardiovascular perfusion will improve Outcome: Progressing Goal: Vascular access site(s) Level 0-1 will be maintained Outcome: Progressing   Problem: Health Behavior/Discharge Planning: Goal: Ability to safely manage health-related needs after discharge will improve Outcome: Progressing   Problem: Fluid Volume: Goal: Hemodynamic stability will improve Outcome: Progressing   Problem: Clinical Measurements: Goal: Diagnostic test results will improve Outcome: Progressing Goal: Signs and symptoms of infection will decrease Outcome: Progressing   Problem: Respiratory: Goal: Ability to maintain adequate ventilation will improve Outcome: Progressing   Problem: Education: Goal: Knowledge of General Education information will improve Description: Including pain rating scale, medication(s)/side effects and non-pharmacologic comfort measures Outcome: Progressing   Problem: Health Behavior/Discharge Planning: Goal: Ability to manage health-related needs will improve Outcome: Progressing   Problem: Clinical Measurements: Goal: Ability to maintain clinical measurements within normal limits will improve Outcome: Progressing Goal: Will remain free from infection Outcome: Progressing Goal: Diagnostic test results will improve Outcome: Progressing Goal: Respiratory complications will improve Outcome: Progressing Goal: Cardiovascular complication will be avoided Outcome: Progressing   Problem: Activity: Goal: Risk for activity intolerance will decrease Outcome: Progressing   Problem: Nutrition: Goal: Adequate  nutrition will be maintained Outcome: Progressing   Problem: Coping: Goal: Level of anxiety will decrease Outcome: Progressing   Problem: Elimination: Goal: Will not experience complications related to bowel motility Outcome: Progressing Goal: Will not experience complications related to urinary retention Outcome: Progressing   Problem: Pain Managment: Goal: General experience of comfort will improve Outcome: Progressing   Problem: Safety: Goal: Ability to remain free from injury will improve Outcome: Progressing   Problem: Skin Integrity: Goal: Risk for impaired skin integrity will decrease Outcome: Progressing   

## 2022-12-27 NOTE — Progress Notes (Signed)
Progress Note   Patient: Rebecca Gilmore ONG:[id number] DOB: 02-Nov-1940 DOA: 12/24/2022     3 DOS: the patient was seen and examined on 12/27/2022   Brief hospital course: Rebecca Gilmore is a 82 year old female with history of non-insulin-dependent diabetes mellitus, hyperlipidemia, depression, who presents emergency department for chief concerns of altered mental status. Upon arriving hospital, patient was hypotensive, received 3 L fluid bolus. CT abdomen pelvis wo contrast: Was read as mild fat stranding surrounding the left upper ureter without discrete obstructing mass or calculi.  Mild left perirenal fat stranding as well.  Correlate with urine analysis for evidence of urinary tract infection.  Patient was placed on Rocephin for urinary tract infection. Blood culture positive for E. Coli.  Urine culture positive for Citrobacter.  Bacteremia most likely still coming from urinary tract infection.  Patient is treated with Rocephin 2 g every 24 hours, plan to transition to oral cephalexin 1 g 3 times a day to complete 10-day course at time of discharge.   Principal Problem:   Severe sepsis with acute organ dysfunction (HCC) Active Problems:   AKI (acute kidney injury) (HCC)   Coronary artery disease   Iron deficiency anemia   Anemia of chronic renal failure, stage 3b (HCC)   HLD (hyperlipidemia)   Depression   CKD stage 3b, GFR 30-44 ml/min (HCC)   Leukocytosis   Left leg pain   Morbid obesity (HCC)   E. coli septicemia (HCC)   Metabolic acidosis   Assessment and Plan: * Severe sepsis with acute organ dysfunction (HCC) Urinary tract infection. E. coli septicemia. Acute metabolic encephalopathy secondary to sepsis. Had a significant hypotension from admission, she also had altered mental status.  Also met sepsis criteria with severe leukocytosis, tachycardia and tachypnea.  Lactic acid 1.6. This is thought to be secondary to urinary tract infection, urine culture sent out.   Blood culture came back with E. coli.  Continue Rocephin. Urine culture actually positive for Citrobacter.  But bacteremia still considered caused by UTI.   Continue Rocephin, transition to oral at time of discharge. PT and OT recommended nursing home placement, but the patient preferred to go home, we will talk with the patient further and family to decide the appropriate discharge option.   AKI (acute kidney injury) (HCC) ruled out. Chronic kidney disease stage IIIb. Hyponatremia. Metabolic acidosis. Hypomagnesemia. Renal function continues to improve, metabolic acidosis resolved.  Magnesium normalized.   Iron deficient anemia. B12 deficient anemia. Patient recent iron study showed a mild iron deficiency, on iron supplement.  Patient is also on B12 supplement.   Coronary artery disease Resumed home Plavix 75 mg daily, rosuvastatin 5 mg daily   HLD (hyperlipidemia) Home rosuvastatin 5 mg nightly resumed   Depression Zoom home medicines.   Morbid obesity (HCC) BMI 36.14 with comorbidities.  Diet exercise advised.   Peripheral arterial disease with recent intervention. Recent ABI performed on 12/08/2022 showed distal PVD.   Weakness and frequent falls. Spoke with the had frequent falls, had a hard fall in August, still complaining of pain in the left hip left elbow and left shoulder.  X-rays showed arthritis without fractures.        Subjective: Patient doing well today, still has some hip pain.  No shortness of breath.  Physical Exam: Vitals:   12/26/22 0926 12/26/22 2028 12/27/22 0512 12/27/22 0920  BP: (!) 124/59 (!) 90/52 (!) 103/54 (!) 110/43  Pulse: 71 71 71 75  Resp: 16 18 18 18   Temp:  97.6 F (36.4 C) 98.5 F (36.9 C) 98.1 F (36.7 C) 98.4 F (36.9 C)  TempSrc: Oral Oral Oral   SpO2: 100% 99% 96% 98%  Weight:      Height:       General exam: Appears calm and comfortable, morbid obese. Respiratory system: Clear to auscultation. Respiratory effort  normal. Cardiovascular system: S1 & S2 heard, RRR. No JVD, murmurs, rubs, gallops or clicks. No pedal edema. Gastrointestinal system: Abdomen is nondistended, soft and nontender. No organomegaly or masses felt. Normal bowel sounds heard. Central nervous system: Alert and oriented. No focal neurological deficits. Extremities: Symmetric 5 x 5 power. Skin: No rashes, lesions or ulcers Psychiatry: Judgement and insight appear normal. Mood & affect appropriate.    Data Reviewed:  Lab results reviewed.  Family Communication: Daughter updated over the phone.  Disposition: Status is: Inpatient Remains inpatient appropriate because: Severity of disease, IV treatment. PT/OT has recommended nursing home placement.  Discussed with the patient and daughter, decision was made to to go home tomorrow with home care.       Time spent: 35 minutes  Author: Marrion Coy, MD 12/27/2022 1:02 PM  For on call review [email or web address].

## 2022-12-28 ENCOUNTER — Other Ambulatory Visit: Payer: Self-pay | Admitting: *Deleted

## 2022-12-28 DIAGNOSIS — N1832 Chronic kidney disease, stage 3b: Secondary | ICD-10-CM | POA: Diagnosis not present

## 2022-12-28 DIAGNOSIS — N179 Acute kidney failure, unspecified: Secondary | ICD-10-CM

## 2022-12-28 DIAGNOSIS — A4151 Sepsis due to Escherichia coli [E. coli]: Secondary | ICD-10-CM | POA: Diagnosis not present

## 2022-12-28 DIAGNOSIS — A419 Sepsis, unspecified organism: Secondary | ICD-10-CM

## 2022-12-28 LAB — PHOSPHORUS: Phosphorus: 2.9 mg/dL (ref 2.5–4.6)

## 2022-12-28 LAB — BASIC METABOLIC PANEL
Anion gap: 7 (ref 5–15)
BUN: 21 mg/dL (ref 8–23)
CO2: 24 mmol/L (ref 22–32)
Calcium: 8.3 mg/dL — ABNORMAL LOW (ref 8.9–10.3)
Chloride: 104 mmol/L (ref 98–111)
Creatinine, Ser: 1.41 mg/dL — ABNORMAL HIGH (ref 0.44–1.00)
GFR, Estimated: 37 mL/min — ABNORMAL LOW (ref >60)
Glucose, Bld: 95 mg/dL (ref 70–99)
Potassium: 3.9 mmol/L (ref 3.5–5.1)
Sodium: 135 mmol/L (ref 135–145)

## 2022-12-28 LAB — MAGNESIUM: Magnesium: 1.7 mg/dL (ref 1.7–2.4)

## 2022-12-28 MED ORDER — LIDOCAINE 5 % EX PTCH: 1.0000 | MEDICATED_PATCH | CUTANEOUS | 0 refills | Status: AC

## 2022-12-28 MED ORDER — CIPROFLOXACIN HCL 500 MG PO TABS
500.0000 mg | ORAL_TABLET | Freq: Two times a day (BID) | ORAL | 0 refills | Status: AC
Start: 2022-12-29 — End: 2022-12-31

## 2022-12-28 NOTE — Consult Note (Signed)
[facility] Customer service manager [facility]) [facility]able Care Organization [facility] Liaison Note  12/28/2022  Rebecca Gilmore 15-Apr-1940 [id number]  Location: [facility] RN Hospital Liaison met patient at bedside at [facility].  Insurance: [employer]   Rebecca Gilmore is a 82 y.o. female who is a Primary Care Patient of [facility], Durward Fortes, MD [facility] [place]. The patient was screened for  readmission hospitalization with noted extreme risk score for unplanned readmission risk with 3 IP/1 ED in 6 months.  The patient was assessed for potential [facility]) Care Management service needs for post hospital transition for care coordination. Review of patient's electronic medical record reveals patient was admitted for Severe Sepsis. Liaison attempted bedside visit however pt has discharged.  Liaison attempted outreach call however unsuccessful. Pt opt to declined recommended SNF level of care and will discharged home in the care of family with [facility] services. Will make a referral for care management services.  Plan: [facility] Liaison will continue to follow progress and disposition to asess for post hospital community care coordination/management needs.  Referral request for community care coordination: Will make a referral due to admitting diagnosis and post hospital readmission prevention follow up call.   [facility] Care Management/Population Health does not replace or interfere with any arrangements made by the Inpatient Transition of Care team.   For questions contact:   Elliot Cousin, RN, [facility] Liaison [place]   [facility], Population Health Office Hours MTWF  8:00 am-6:00 pm Direct Dial: [phone] mobile [phone] [Office toll free line] Office Hours are M-F 8:30 - 5 pm [email or web address]

## 2022-12-28 NOTE — Plan of Care (Signed)
Adequate for discharge.

## 2022-12-28 NOTE — TOC Initial Note (Signed)
Transition of Care [facility]) - Initial/Assessment Note    Patient Details  Name: Fikisha Manzoor MRN: [id number] Date of Birth: 1940-07-19  Transition of [facility]) CM/SW Contact:    Chapman Fitch, RN Phone Number: 12/28/2022, 11:11 AM  Clinical Narrative:                  Admitted [id number] Admitted from: home with spouse PCP: [facility] - daughter Boneta Lucks transports to appointments Current home health/prior home health/DME: RW, rollator, [facility]  Therapy recommending SNF.  Met with patient at bedside. Declines SNF.  Agreeable to home health.  CMS Medicare.gov Compare Post Acute Care list reviewed with patient.  Patient states she does not have a preference of home health agency.  Referral made to [facility] with [facility].    Patient states daughter will transport at discharge    Expected Discharge Plan: Home w Home Health Services     Patient Goals and CMS Choice   CMS Medicare.gov Compare Post Acute Care list provided to:: Patient Choice offered to / list presented to : Patient      Expected Discharge Plan and Services     Post Acute Care Choice: Home Health   Expected Discharge Date: 12/28/22                         HH Arranged: RN, PT, OT HH Agency: [facility] Agency Contacted: 12/28/22   Representative spoke with at [facility] Agency: Kandee Keen  Prior Living Arrangements/Services                       Activities of Daily Living   ADL Screening (condition at time of admission) Independently performs ADLs?: Yes (appropriate for developmental age) Is the patient deaf or have difficulty hearing?: No Does the patient have difficulty seeing, even when wearing glasses/contacts?: No Does the patient have difficulty concentrating, remembering, or making decisions?: Yes  Permission Sought/Granted                  Emotional Assessment              Admission diagnosis:  Acute cystitis without hematuria [N30.00] Severe sepsis with acute organ  dysfunction (HCC) [A41.9, R65.20] Patient Active Problem List   Diagnosis Date Noted   E. coli septicemia (HCC) 12/25/2022   Metabolic acidosis 12/25/2022   Severe sepsis with acute organ dysfunction (HCC) 12/24/2022   CKD stage 3b, GFR 30-44 ml/min (HCC) 12/24/2022   AKI (acute kidney injury) (HCC) 12/24/2022   Leukocytosis 12/24/2022   Left leg pain 12/24/2022   Morbid obesity (HCC) 12/24/2022   Acute ST elevation myocardial infarction (STEMI) involving left anterior descending (LAD) coronary artery (HCC) 08/05/2022   Acute on chronic diastolic CHF (congestive heart failure) (HCC) 07/21/2022   History of recurrent GI bleed 07/19/2022   Anemia of chronic renal failure, stage 3b (HCC) 07/19/2022   Coronary artery disease 07/19/2022   Elevated troponin 07/19/2022   Hypotension 07/19/2022   Anemia of chronic kidney failure, stage 4 (severe) (HCC) 02/20/2022   Symptomatic anemia 02/18/2022   Morbid obesity with BMI of 40.0-44.9, adult (HCC) 02/18/2022   Acute on chronic diastolic (congestive) heart failure (HCC) 02/18/2022   Anemia, unspecified 02/18/2022   Chronic kidney disease, stage 3b (HCC) 01/27/2022   Hypokalemia 01/27/2022   Acute on chronic blood loss anemia 01/27/2022   CKD (chronic kidney disease) stage 4, GFR 15-29 ml/min (HCC) 12/23/2021   Anemia  GI bleeding 12/11/2021   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   Acute blood loss anemia 12/11/2021   Depression    HLD (hyperlipidemia)    Iron deficiency anemia    Chronic diastolic CHF (congestive heart failure) (HCC)    Myocardial injury    Atrial fibrillation, chronic (HCC)    Femoral artery occlusion, 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 AT [facility] [address] [place] [address] Phone: [phone] Fax: [phone]  [facility] [address], [place] - [address] [place] [address] Phone: [phone] Fax: [phone]     Social Determinants of Health (SDOH) Social History: SDOH Screenings   Food Insecurity: No Food Insecurity (12/24/2022)  Housing: Low Risk  (12/24/2022)  Transportation Needs: No Transportation Needs (12/24/2022)  Utilities: Not At Risk (12/24/2022)  Financial Resource Strain: Low Risk  (03/07/2021)   Received from [facility]  Tobacco Use: Medium Risk (12/24/2022)   SDOH Interventions:     Readmission Risk Interventions    12/24/2022    4:02 PM  Readmission Risk Prevention Plan  Transportation Screening Complete  HRI or Home Care Consult Complete  SW Recovery Care/Counseling Consult Complete  Palliative Care Screening Not Applicable  Skilled Nursing Facility Not Applicable

## 2022-12-28 NOTE — Care Management Important Message (Signed)
Important Message  Patient Details  Name: Rebecca Gilmore MRN: [id number] Date of Birth: 09/10/1940   Important Message Given:  Yes - Medicare IM     Chapman Fitch, RN 12/28/2022, 11:08 AM

## 2022-12-28 NOTE — Discharge Summary (Signed)
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  Vascular Interventions: 11/16/2021 Left iliofemoral endart. Performing Technologist: Hardie Lora RVT   Examination Guidelines: A complete evaluation includes at minimum, Doppler waveform signals and systolic blood pressure reading at the level of bilateral brachial, anterior tibial, and posterior tibial arteries, when vessel segments are accessible. Bilateral testing is considered an integral part of a complete examination. Photoelectric Plethysmograph (PPG) waveforms and toe systolic pressure readings are included as required and additional duplex testing as needed. Limited examinations for reoccurring indications may be performed as noted.  ABI Findings: +---------+------------------+-----+---------+--------+ Right    Rt Pressure (mmHg)IndexWaveform Comment  +---------+------------------+-----+---------+--------+ Brachial 119                                      +---------+------------------+-----+---------+--------+ PTA      142               1.19 triphasic         +---------+------------------+-----+---------+--------+ DP       119               1.00 triphasic         +---------+------------------+-----+---------+--------+ Great Toe104               0.87                   +---------+------------------+-----+---------+--------+ +---------+------------------+-----+---------+-------+ Left     Lt Pressure (mmHg)IndexWaveform Comment +---------+------------------+-----+---------+-------+ Brachial 115                                     +---------+------------------+-----+---------+-------+ PTA      125               1.05 biphasic         +---------+------------------+-----+---------+-------+ DP       135               1.13 triphasic        +---------+------------------+-----+---------+-------+ Great Toe82                0.69                  +---------+------------------+-----+---------+-------+ +-------+-----------+-----------+------------+------------+ ABI/TBIToday's ABIToday's TBIPrevious ABIPrevious TBI  +-------+-----------+-----------+------------+------------+ Right  1.19       0.87       1.27        0.78         +-------+-----------+-----------+------------+------------+ Left   1.13       0.69       1.08        0.75         +-------+-----------+-----------+------------+------------+ Compared to prior study on 06/09/2022.  Summary: Right: Resting right ankle-brachial index is within normal range. The right toe-brachial index is normal. Left: Resting left ankle-brachial index is within normal range. The left toe-brachial index is abnormal. *See table(s) above for measurements and observations.  Electronically signed by Festus Barren MD on 12/08/2022 at 4:24:45 PM.    Final     Microbiology: Results for orders placed or performed during the hospital encounter of 12/24/22  [id number] Date of Birth: [birth year]         Patient Gender: F Patient Age:   82 years Exam Location:  [facility] Procedure:      VAS [place] ABI WITH/WO TBI Referring Phys: JASON DEW --------------------------------------------------------------------------------  High Risk Factors: Hyperlipidemia, prior MI.  Vascular Interventions: 11/16/2021 Left iliofemoral endart. Performing Technologist: Hardie Lora RVT   Examination Guidelines: A complete evaluation includes at minimum, Doppler waveform signals and systolic blood pressure reading at the level of bilateral brachial, anterior tibial, and posterior tibial arteries, when vessel segments are accessible. Bilateral testing is considered an integral part of a complete examination. Photoelectric Plethysmograph (PPG) waveforms and toe systolic pressure readings are included as required and additional duplex testing as needed. Limited examinations for reoccurring indications may be performed as noted.  ABI Findings: +---------+------------------+-----+---------+--------+ Right    Rt Pressure (mmHg)IndexWaveform Comment  +---------+------------------+-----+---------+--------+ Brachial 119                                      +---------+------------------+-----+---------+--------+ PTA      142               1.19 triphasic         +---------+------------------+-----+---------+--------+ DP       119               1.00 triphasic         +---------+------------------+-----+---------+--------+ Great Toe104               0.87                   +---------+------------------+-----+---------+--------+ +---------+------------------+-----+---------+-------+ Left     Lt Pressure (mmHg)IndexWaveform Comment +---------+------------------+-----+---------+-------+ Brachial 115                                     +---------+------------------+-----+---------+-------+ PTA      125               1.05 biphasic         +---------+------------------+-----+---------+-------+ DP       135               1.13 triphasic        +---------+------------------+-----+---------+-------+ Great Toe82                0.69                  +---------+------------------+-----+---------+-------+ +-------+-----------+-----------+------------+------------+ ABI/TBIToday's ABIToday's TBIPrevious ABIPrevious TBI  +-------+-----------+-----------+------------+------------+ Right  1.19       0.87       1.27        0.78         +-------+-----------+-----------+------------+------------+ Left   1.13       0.69       1.08        0.75         +-------+-----------+-----------+------------+------------+ Compared to prior study on 06/09/2022.  Summary: Right: Resting right ankle-brachial index is within normal range. The right toe-brachial index is normal. Left: Resting left ankle-brachial index is within normal range. The left toe-brachial index is abnormal. *See table(s) above for measurements and observations.  Electronically signed by Festus Barren MD on 12/08/2022 at 4:24:45 PM.    Final     Microbiology: Results for orders placed or performed during the hospital encounter of 12/24/22  [id number] Date of Birth: [birth year]         Patient Gender: F Patient Age:   82 years Exam Location:  [facility] Procedure:      VAS [place] ABI WITH/WO TBI Referring Phys: JASON DEW --------------------------------------------------------------------------------  High Risk Factors: Hyperlipidemia, prior MI.  Vascular Interventions: 11/16/2021 Left iliofemoral endart. Performing Technologist: Hardie Lora RVT   Examination Guidelines: A complete evaluation includes at minimum, Doppler waveform signals and systolic blood pressure reading at the level of bilateral brachial, anterior tibial, and posterior tibial arteries, when vessel segments are accessible. Bilateral testing is considered an integral part of a complete examination. Photoelectric Plethysmograph (PPG) waveforms and toe systolic pressure readings are included as required and additional duplex testing as needed. Limited examinations for reoccurring indications may be performed as noted.  ABI Findings: +---------+------------------+-----+---------+--------+ Right    Rt Pressure (mmHg)IndexWaveform Comment  +---------+------------------+-----+---------+--------+ Brachial 119                                      +---------+------------------+-----+---------+--------+ PTA      142               1.19 triphasic         +---------+------------------+-----+---------+--------+ DP       119               1.00 triphasic         +---------+------------------+-----+---------+--------+ Great Toe104               0.87                   +---------+------------------+-----+---------+--------+ +---------+------------------+-----+---------+-------+ Left     Lt Pressure (mmHg)IndexWaveform Comment +---------+------------------+-----+---------+-------+ Brachial 115                                     +---------+------------------+-----+---------+-------+ PTA      125               1.05 biphasic         +---------+------------------+-----+---------+-------+ DP       135               1.13 triphasic        +---------+------------------+-----+---------+-------+ Great Toe82                0.69                  +---------+------------------+-----+---------+-------+ +-------+-----------+-----------+------------+------------+ ABI/TBIToday's ABIToday's TBIPrevious ABIPrevious TBI  +-------+-----------+-----------+------------+------------+ Right  1.19       0.87       1.27        0.78         +-------+-----------+-----------+------------+------------+ Left   1.13       0.69       1.08        0.75         +-------+-----------+-----------+------------+------------+ Compared to prior study on 06/09/2022.  Summary: Right: Resting right ankle-brachial index is within normal range. The right toe-brachial index is normal. Left: Resting left ankle-brachial index is within normal range. The left toe-brachial index is abnormal. *See table(s) above for measurements and observations.  Electronically signed by Festus Barren MD on 12/08/2022 at 4:24:45 PM.    Final     Microbiology: Results for orders placed or performed during the hospital encounter of 12/24/22  [id number] Date of Birth: [birth year]         Patient Gender: F Patient Age:   82 years Exam Location:  [facility] Procedure:      VAS [place] ABI WITH/WO TBI Referring Phys: JASON DEW --------------------------------------------------------------------------------  High Risk Factors: Hyperlipidemia, prior MI.  Vascular Interventions: 11/16/2021 Left iliofemoral endart. Performing Technologist: Hardie Lora RVT   Examination Guidelines: A complete evaluation includes at minimum, Doppler waveform signals and systolic blood pressure reading at the level of bilateral brachial, anterior tibial, and posterior tibial arteries, when vessel segments are accessible. Bilateral testing is considered an integral part of a complete examination. Photoelectric Plethysmograph (PPG) waveforms and toe systolic pressure readings are included as required and additional duplex testing as needed. Limited examinations for reoccurring indications may be performed as noted.  ABI Findings: +---------+------------------+-----+---------+--------+ Right    Rt Pressure (mmHg)IndexWaveform Comment  +---------+------------------+-----+---------+--------+ Brachial 119                                      +---------+------------------+-----+---------+--------+ PTA      142               1.19 triphasic         +---------+------------------+-----+---------+--------+ DP       119               1.00 triphasic         +---------+------------------+-----+---------+--------+ Great Toe104               0.87                   +---------+------------------+-----+---------+--------+ +---------+------------------+-----+---------+-------+ Left     Lt Pressure (mmHg)IndexWaveform Comment +---------+------------------+-----+---------+-------+ Brachial 115                                     +---------+------------------+-----+---------+-------+ PTA      125               1.05 biphasic         +---------+------------------+-----+---------+-------+ DP       135               1.13 triphasic        +---------+------------------+-----+---------+-------+ Great Toe82                0.69                  +---------+------------------+-----+---------+-------+ +-------+-----------+-----------+------------+------------+ ABI/TBIToday's ABIToday's TBIPrevious ABIPrevious TBI  +-------+-----------+-----------+------------+------------+ Right  1.19       0.87       1.27        0.78         +-------+-----------+-----------+------------+------------+ Left   1.13       0.69       1.08        0.75         +-------+-----------+-----------+------------+------------+ Compared to prior study on 06/09/2022.  Summary: Right: Resting right ankle-brachial index is within normal range. The right toe-brachial index is normal. Left: Resting left ankle-brachial index is within normal range. The left toe-brachial index is abnormal. *See table(s) above for measurements and observations.  Electronically signed by Festus Barren MD on 12/08/2022 at 4:24:45 PM.    Final     Microbiology: Results for orders placed or performed during the hospital encounter of 12/24/22  [id number] Date of Birth: [birth year]         Patient Gender: F Patient Age:   82 years Exam Location:  [facility] Procedure:      VAS [place] ABI WITH/WO TBI Referring Phys: JASON DEW --------------------------------------------------------------------------------  High Risk Factors: Hyperlipidemia, prior MI.  Vascular Interventions: 11/16/2021 Left iliofemoral endart. Performing Technologist: Hardie Lora RVT   Examination Guidelines: A complete evaluation includes at minimum, Doppler waveform signals and systolic blood pressure reading at the level of bilateral brachial, anterior tibial, and posterior tibial arteries, when vessel segments are accessible. Bilateral testing is considered an integral part of a complete examination. Photoelectric Plethysmograph (PPG) waveforms and toe systolic pressure readings are included as required and additional duplex testing as needed. Limited examinations for reoccurring indications may be performed as noted.  ABI Findings: +---------+------------------+-----+---------+--------+ Right    Rt Pressure (mmHg)IndexWaveform Comment  +---------+------------------+-----+---------+--------+ Brachial 119                                      +---------+------------------+-----+---------+--------+ PTA      142               1.19 triphasic         +---------+------------------+-----+---------+--------+ DP       119               1.00 triphasic         +---------+------------------+-----+---------+--------+ Great Toe104               0.87                   +---------+------------------+-----+---------+--------+ +---------+------------------+-----+---------+-------+ Left     Lt Pressure (mmHg)IndexWaveform Comment +---------+------------------+-----+---------+-------+ Brachial 115                                     +---------+------------------+-----+---------+-------+ PTA      125               1.05 biphasic         +---------+------------------+-----+---------+-------+ DP       135               1.13 triphasic        +---------+------------------+-----+---------+-------+ Great Toe82                0.69                  +---------+------------------+-----+---------+-------+ +-------+-----------+-----------+------------+------------+ ABI/TBIToday's ABIToday's TBIPrevious ABIPrevious TBI  +-------+-----------+-----------+------------+------------+ Right  1.19       0.87       1.27        0.78         +-------+-----------+-----------+------------+------------+ Left   1.13       0.69       1.08        0.75         +-------+-----------+-----------+------------+------------+ Compared to prior study on 06/09/2022.  Summary: Right: Resting right ankle-brachial index is within normal range. The right toe-brachial index is normal. Left: Resting left ankle-brachial index is within normal range. The left toe-brachial index is abnormal. *See table(s) above for measurements and observations.  Electronically signed by Festus Barren MD on 12/08/2022 at 4:24:45 PM.    Final     Microbiology: Results for orders placed or performed during the hospital encounter of 12/24/22  Physician Discharge Summary   Patient: Karenna Karpowich MRN: [id number] DOB: November 19, 1940  Admit date:     12/24/2022  Discharge date: 12/28/22  Discharge Physician: Marrion Coy   PCP: Ailene Ravel, MD   Recommendations at discharge:   Follow-up with PCP in 1 week.  Discharge Diagnoses: Principal Problem:   Severe sepsis with acute organ dysfunction (HCC) Active Problems:   AKI (acute kidney injury) (HCC)   Coronary artery disease   Iron deficiency anemia   Anemia of chronic renal failure, stage 3b (HCC)   HLD (hyperlipidemia)   Depression   CKD stage 3b, GFR 30-44 ml/min (HCC)   Leukocytosis   Left leg pain   Morbid obesity (HCC)   E. coli septicemia (HCC)   Metabolic acidosis  Resolved Problems:   * No resolved hospital problems. [facility] Course: Ms. Emmajean Osley is a 82 year old female with history of non-insulin-dependent diabetes mellitus, hyperlipidemia, depression, who presents emergency department for chief concerns of altered mental status. Upon arriving hospital, patient was hypotensive, received 3 L fluid bolus. CT abdomen pelvis wo contrast: Was read as mild fat stranding surrounding the left upper ureter without discrete obstructing mass or calculi.  Mild left perirenal fat stranding as well.  Correlate with urine analysis for evidence of urinary tract infection.  Patient was placed on Rocephin for urinary tract infection. Blood culture positive for E. Coli.  Urine culture positive for Citrobacter.  Bacteremia most likely still coming from urinary tract infection.  Patient is treated with Rocephin 2 g every 24 hours.  discussed with infectious disease pharmacist, will treat for additional 2 doses of Cipro after completing the 5th dose of Rocephin today.  Assessment and Plan:  * Severe sepsis with acute organ dysfunction (HCC) Urinary tract infection. E. coli septicemia. Acute metabolic encephalopathy secondary to sepsis. Had a significant hypotension  from admission, she also had altered mental status.  Also met sepsis criteria with severe leukocytosis, tachycardia and tachypnea.  Lactic acid 1.6. This is thought to be secondary to urinary tract infection, urine culture sent out.  Blood culture came back with E. coli.  Continue Rocephin. Urine culture actually positive for Citrobacter.  But bacteremia still considered caused by UTI.   Continue Rocephin, transition to oral at time of discharge. PT and OT recommended nursing home placement, but the patient preferred to go home, also discussed with the patient daughter, she is agreeable for patient to go home with home care. Discussed with infectious disease pharmacy, patient so far has completed 5 days IV antibiotic after today's Rocephin, will continue 2 more days of oral Cipro to complete course.   AKI (acute kidney injury) (HCC) on Chronic kidney disease stage IIIb. Ruled in.  Hyponatremia. Metabolic acidosis. Hypomagnesemia. Condition all improved, patient to initial creatinine was 2.48 at time of admission, now dropped down to 1.42.  Condition is consistent with acute kidney injury on chronic kidney disease stage IIIb.   Iron deficient anemia. B12 deficient anemia. Patient recent iron study showed a mild iron deficiency, on iron supplement.  Patient is also on B12 supplement.   Coronary artery disease Resumed home Plavix 75 mg daily, rosuvastatin 5 mg daily   HLD (hyperlipidemia) Home rosuvastatin 5 mg nightly resumed   Depression [facility] home medicines.   Morbid obesity (HCC) BMI 36.14 with comorbidities.  Diet exercise advised.   Peripheral arterial disease with recent intervention. Recent ABI performed on 12/08/2022 showed distal PVD.   Weakness and frequent falls. Spoke with the  [id number] Date of Birth: [birth year]         Patient Gender: F Patient Age:   82 years Exam Location:  [facility] Procedure:      VAS [place] ABI WITH/WO TBI Referring Phys: JASON DEW --------------------------------------------------------------------------------  High Risk Factors: Hyperlipidemia, prior MI.  Vascular Interventions: 11/16/2021 Left iliofemoral endart. Performing Technologist: Hardie Lora RVT   Examination Guidelines: A complete evaluation includes at minimum, Doppler waveform signals and systolic blood pressure reading at the level of bilateral brachial, anterior tibial, and posterior tibial arteries, when vessel segments are accessible. Bilateral testing is considered an integral part of a complete examination. Photoelectric Plethysmograph (PPG) waveforms and toe systolic pressure readings are included as required and additional duplex testing as needed. Limited examinations for reoccurring indications may be performed as noted.  ABI Findings: +---------+------------------+-----+---------+--------+ Right    Rt Pressure (mmHg)IndexWaveform Comment  +---------+------------------+-----+---------+--------+ Brachial 119                                      +---------+------------------+-----+---------+--------+ PTA      142               1.19 triphasic         +---------+------------------+-----+---------+--------+ DP       119               1.00 triphasic         +---------+------------------+-----+---------+--------+ Great Toe104               0.87                   +---------+------------------+-----+---------+--------+ +---------+------------------+-----+---------+-------+ Left     Lt Pressure (mmHg)IndexWaveform Comment +---------+------------------+-----+---------+-------+ Brachial 115                                     +---------+------------------+-----+---------+-------+ PTA      125               1.05 biphasic         +---------+------------------+-----+---------+-------+ DP       135               1.13 triphasic        +---------+------------------+-----+---------+-------+ Great Toe82                0.69                  +---------+------------------+-----+---------+-------+ +-------+-----------+-----------+------------+------------+ ABI/TBIToday's ABIToday's TBIPrevious ABIPrevious TBI  +-------+-----------+-----------+------------+------------+ Right  1.19       0.87       1.27        0.78         +-------+-----------+-----------+------------+------------+ Left   1.13       0.69       1.08        0.75         +-------+-----------+-----------+------------+------------+ Compared to prior study on 06/09/2022.  Summary: Right: Resting right ankle-brachial index is within normal range. The right toe-brachial index is normal. Left: Resting left ankle-brachial index is within normal range. The left toe-brachial index is abnormal. *See table(s) above for measurements and observations.  Electronically signed by Festus Barren MD on 12/08/2022 at 4:24:45 PM.    Final     Microbiology: Results for orders placed or performed during the hospital encounter of 12/24/22  [id number] Date of Birth: [birth year]         Patient Gender: F Patient Age:   82 years Exam Location:  [facility] Procedure:      VAS [place] ABI WITH/WO TBI Referring Phys: JASON DEW --------------------------------------------------------------------------------  High Risk Factors: Hyperlipidemia, prior MI.  Vascular Interventions: 11/16/2021 Left iliofemoral endart. Performing Technologist: Hardie Lora RVT   Examination Guidelines: A complete evaluation includes at minimum, Doppler waveform signals and systolic blood pressure reading at the level of bilateral brachial, anterior tibial, and posterior tibial arteries, when vessel segments are accessible. Bilateral testing is considered an integral part of a complete examination. Photoelectric Plethysmograph (PPG) waveforms and toe systolic pressure readings are included as required and additional duplex testing as needed. Limited examinations for reoccurring indications may be performed as noted.  ABI Findings: +---------+------------------+-----+---------+--------+ Right    Rt Pressure (mmHg)IndexWaveform Comment  +---------+------------------+-----+---------+--------+ Brachial 119                                      +---------+------------------+-----+---------+--------+ PTA      142               1.19 triphasic         +---------+------------------+-----+---------+--------+ DP       119               1.00 triphasic         +---------+------------------+-----+---------+--------+ Great Toe104               0.87                   +---------+------------------+-----+---------+--------+ +---------+------------------+-----+---------+-------+ Left     Lt Pressure (mmHg)IndexWaveform Comment +---------+------------------+-----+---------+-------+ Brachial 115                                     +---------+------------------+-----+---------+-------+ PTA      125               1.05 biphasic         +---------+------------------+-----+---------+-------+ DP       135               1.13 triphasic        +---------+------------------+-----+---------+-------+ Great Toe82                0.69                  +---------+------------------+-----+---------+-------+ +-------+-----------+-----------+------------+------------+ ABI/TBIToday's ABIToday's TBIPrevious ABIPrevious TBI  +-------+-----------+-----------+------------+------------+ Right  1.19       0.87       1.27        0.78         +-------+-----------+-----------+------------+------------+ Left   1.13       0.69       1.08        0.75         +-------+-----------+-----------+------------+------------+ Compared to prior study on 06/09/2022.  Summary: Right: Resting right ankle-brachial index is within normal range. The right toe-brachial index is normal. Left: Resting left ankle-brachial index is within normal range. The left toe-brachial index is abnormal. *See table(s) above for measurements and observations.  Electronically signed by Festus Barren MD on 12/08/2022 at 4:24:45 PM.    Final     Microbiology: Results for orders placed or performed during the hospital encounter of 12/24/22

## 2022-12-29 ENCOUNTER — Telehealth: Payer: Self-pay | Admitting: *Deleted

## 2022-12-29 LAB — CULTURE, BLOOD (ROUTINE X 2)
Culture: NO GROWTH
Special Requests: ADEQUATE

## 2022-12-29 NOTE — Progress Notes (Signed)
Care Coordination   Note   12/29/2022 Name: Rebecca Gilmore MRN: [id number] DOB: 08-22-1940  Rebecca Gilmore is a 82 y.o. year old female who sees Hamrick, Durward Fortes, MD for primary care. I reached out to Loistine Chance by phone today to offer care coordination services.  [name] was given information about Care Coordination services today including:   The Care Coordination services include support from the care team which includes your Nurse Coordinator, Clinical Social Worker, or Pharmacist.  The Care Coordination team is here to help remove barriers to the health concerns and goals most important to you. Care Coordination services are voluntary, and the patient may decline or stop services at any time by request to their care team member.   Care Coordination Consent Status: Patient agreed to services and verbal consent obtained.   Follow up plan:  Telephone appointment with care coordination team member scheduled for:  01/08/2023  Encounter Outcome:  Patient Scheduled from referral   Burman Nieves, [facility] Care Coordination Care Guide Direct Dial: [phone]

## 2022-12-31 ENCOUNTER — Inpatient Hospital Stay: Payer: Medicare Other | Attending: Oncology

## 2022-12-31 DIAGNOSIS — I350 Nonrheumatic aortic (valve) stenosis: Secondary | ICD-10-CM | POA: Insufficient documentation

## 2022-12-31 DIAGNOSIS — Z87891 Personal history of nicotine dependence: Secondary | ICD-10-CM | POA: Diagnosis not present

## 2022-12-31 DIAGNOSIS — D631 Anemia in chronic kidney disease: Secondary | ICD-10-CM | POA: Insufficient documentation

## 2022-12-31 DIAGNOSIS — N179 Acute kidney failure, unspecified: Secondary | ICD-10-CM | POA: Diagnosis not present

## 2022-12-31 DIAGNOSIS — Z950 Presence of cardiac pacemaker: Secondary | ICD-10-CM | POA: Insufficient documentation

## 2022-12-31 DIAGNOSIS — Z7952 Long term (current) use of systemic steroids: Secondary | ICD-10-CM | POA: Insufficient documentation

## 2022-12-31 DIAGNOSIS — R5383 Other fatigue: Secondary | ICD-10-CM | POA: Diagnosis not present

## 2022-12-31 DIAGNOSIS — N184 Chronic kidney disease, stage 4 (severe): Secondary | ICD-10-CM | POA: Diagnosis not present

## 2022-12-31 DIAGNOSIS — Z8249 Family history of ischemic heart disease and other diseases of the circulatory system: Secondary | ICD-10-CM | POA: Diagnosis not present

## 2022-12-31 DIAGNOSIS — N390 Urinary tract infection, site not specified: Secondary | ICD-10-CM | POA: Diagnosis not present

## 2022-12-31 DIAGNOSIS — Z952 Presence of prosthetic heart valve: Secondary | ICD-10-CM | POA: Diagnosis not present

## 2022-12-31 DIAGNOSIS — G9341 Metabolic encephalopathy: Secondary | ICD-10-CM | POA: Diagnosis not present

## 2022-12-31 DIAGNOSIS — R6520 Severe sepsis without septic shock: Secondary | ICD-10-CM | POA: Diagnosis not present

## 2022-12-31 LAB — FERRITIN: Ferritin: 344 ng/mL — ABNORMAL HIGH (ref 11–307)

## 2022-12-31 LAB — RETIC PANEL
Immature Retic Fract: 11.7 % (ref 2.3–15.9)
RBC.: 3.43 MIL/uL — ABNORMAL LOW (ref 3.87–5.11)
Retic Count, Absolute: 53.2 10*3/uL (ref 19.0–186.0)
Retic Ct Pct: 1.6 % (ref 0.4–3.1)
Reticulocyte Hemoglobin: 28.6 pg (ref >27.9)

## 2022-12-31 LAB — CBC WITH DIFFERENTIAL (CANCER CENTER ONLY)
Abs Immature Granulocytes: 0.07 10*3/uL (ref 0.00–0.07)
Basophils Absolute: 0 10*3/uL (ref 0.0–0.1)
Basophils Relative: 0 %
Eosinophils Absolute: 0.2 10*3/uL (ref 0.0–0.5)
Eosinophils Relative: 2 %
HCT: 30.4 % — ABNORMAL LOW (ref 36.0–46.0)
Hemoglobin: 9.8 g/dL — ABNORMAL LOW (ref 12.0–15.0)
Immature Granulocytes: 1 %
Lymphocytes Relative: 14 %
Lymphs Abs: 1.2 10*3/uL (ref 0.7–4.0)
MCH: 28.7 pg (ref 26.0–34.0)
MCHC: 32.2 g/dL (ref 30.0–36.0)
MCV: 89.1 fL (ref 80.0–100.0)
Monocytes Absolute: 0.6 10*3/uL (ref 0.1–1.0)
Monocytes Relative: 7 %
Neutro Abs: 6.8 10*3/uL (ref 1.7–7.7)
Neutrophils Relative %: 76 %
Platelet Count: 253 10*3/uL (ref 150–400)
RBC: 3.41 MIL/uL — ABNORMAL LOW (ref 3.87–5.11)
RDW: 14.4 % (ref 11.5–15.5)
WBC Count: 9 10*3/uL (ref 4.0–10.5)
nRBC: 0 % (ref 0.0–0.2)

## 2022-12-31 LAB — IRON AND TIBC
Iron: 25 ug/dL — ABNORMAL LOW (ref 28–170)
Saturation Ratios: 12 % (ref 10.4–31.8)
TIBC: 204 ug/dL — ABNORMAL LOW (ref 250–450)
UIBC: 179 ug/dL

## 2023-01-01 DIAGNOSIS — N390 Urinary tract infection, site not specified: Secondary | ICD-10-CM | POA: Diagnosis not present

## 2023-01-01 DIAGNOSIS — R6520 Severe sepsis without septic shock: Secondary | ICD-10-CM | POA: Diagnosis not present

## 2023-01-01 DIAGNOSIS — N179 Acute kidney failure, unspecified: Secondary | ICD-10-CM | POA: Diagnosis not present

## 2023-01-01 DIAGNOSIS — G9341 Metabolic encephalopathy: Secondary | ICD-10-CM | POA: Diagnosis not present

## 2023-01-04 DIAGNOSIS — N390 Urinary tract infection, site not specified: Secondary | ICD-10-CM | POA: Diagnosis not present

## 2023-01-04 DIAGNOSIS — Z79899 Other long term (current) drug therapy: Secondary | ICD-10-CM | POA: Diagnosis not present

## 2023-01-04 DIAGNOSIS — R7881 Bacteremia: Secondary | ICD-10-CM | POA: Diagnosis not present

## 2023-01-04 DIAGNOSIS — M79672 Pain in left foot: Secondary | ICD-10-CM | POA: Diagnosis not present

## 2023-01-04 DIAGNOSIS — M79671 Pain in right foot: Secondary | ICD-10-CM | POA: Diagnosis not present

## 2023-01-04 DIAGNOSIS — K1379 Other lesions of oral mucosa: Secondary | ICD-10-CM | POA: Diagnosis not present

## 2023-01-04 DIAGNOSIS — D638 Anemia in other chronic diseases classified elsewhere: Secondary | ICD-10-CM | POA: Diagnosis not present

## 2023-01-05 ENCOUNTER — Inpatient Hospital Stay (HOSPITAL_BASED_OUTPATIENT_CLINIC_OR_DEPARTMENT_OTHER): Payer: Medicare Other | Admitting: Oncology

## 2023-01-05 ENCOUNTER — Encounter: Payer: Self-pay | Admitting: Oncology

## 2023-01-05 ENCOUNTER — Inpatient Hospital Stay: Payer: Medicare Other

## 2023-01-05 VITALS — BP 112/66 | HR 71 | Temp 98.3°F | Ht 64.0 in | Wt 211.6 lb

## 2023-01-05 DIAGNOSIS — R5383 Other fatigue: Secondary | ICD-10-CM | POA: Diagnosis not present

## 2023-01-05 DIAGNOSIS — I214 Non-ST elevation (NSTEMI) myocardial infarction: Secondary | ICD-10-CM | POA: Diagnosis not present

## 2023-01-05 DIAGNOSIS — Z952 Presence of prosthetic heart valve: Secondary | ICD-10-CM | POA: Diagnosis not present

## 2023-01-05 DIAGNOSIS — I350 Nonrheumatic aortic (valve) stenosis: Secondary | ICD-10-CM | POA: Diagnosis not present

## 2023-01-05 DIAGNOSIS — D62 Acute posthemorrhagic anemia: Secondary | ICD-10-CM

## 2023-01-05 DIAGNOSIS — D631 Anemia in chronic kidney disease: Secondary | ICD-10-CM | POA: Diagnosis not present

## 2023-01-05 DIAGNOSIS — Z7952 Long term (current) use of systemic steroids: Secondary | ICD-10-CM | POA: Diagnosis not present

## 2023-01-05 DIAGNOSIS — I453 Trifascicular block: Secondary | ICD-10-CM | POA: Diagnosis not present

## 2023-01-05 DIAGNOSIS — I10 Essential (primary) hypertension: Secondary | ICD-10-CM | POA: Diagnosis not present

## 2023-01-05 DIAGNOSIS — I2510 Atherosclerotic heart disease of native coronary artery without angina pectoris: Secondary | ICD-10-CM | POA: Diagnosis not present

## 2023-01-05 DIAGNOSIS — R001 Bradycardia, unspecified: Secondary | ICD-10-CM | POA: Diagnosis not present

## 2023-01-05 DIAGNOSIS — I4891 Unspecified atrial fibrillation: Secondary | ICD-10-CM | POA: Diagnosis not present

## 2023-01-05 DIAGNOSIS — Z8249 Family history of ischemic heart disease and other diseases of the circulatory system: Secondary | ICD-10-CM | POA: Diagnosis not present

## 2023-01-05 DIAGNOSIS — I4892 Unspecified atrial flutter: Secondary | ICD-10-CM | POA: Diagnosis not present

## 2023-01-05 DIAGNOSIS — E785 Hyperlipidemia, unspecified: Secondary | ICD-10-CM | POA: Diagnosis not present

## 2023-01-05 DIAGNOSIS — N184 Chronic kidney disease, stage 4 (severe): Secondary | ICD-10-CM

## 2023-01-05 DIAGNOSIS — R0602 Shortness of breath: Secondary | ICD-10-CM | POA: Diagnosis not present

## 2023-01-05 DIAGNOSIS — Z87891 Personal history of nicotine dependence: Secondary | ICD-10-CM | POA: Diagnosis not present

## 2023-01-05 DIAGNOSIS — Z950 Presence of cardiac pacemaker: Secondary | ICD-10-CM | POA: Diagnosis not present

## 2023-01-05 DIAGNOSIS — I5033 Acute on chronic diastolic (congestive) heart failure: Secondary | ICD-10-CM | POA: Diagnosis not present

## 2023-01-05 MED ORDER — EPOETIN ALFA-EPBX 10000 UNIT/ML IJ SOLN
10000.0000 [IU] | Freq: Once | INTRAMUSCULAR | Status: AC
  Administered 2023-01-05: 10000 [IU] via SUBCUTANEOUS
  Filled 2023-01-05: qty 1

## 2023-01-05 NOTE — Assessment & Plan Note (Signed)
Encourage oral hydration and avoid nephrotoxins.   

## 2023-01-05 NOTE — Progress Notes (Signed)
Pacemaker placed at [facility] since last visit.

## 2023-01-05 NOTE — Assessment & Plan Note (Addendum)
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 Previous Bone marrow biopsy results showed hypercellular marrow, no significant dysplastic features.  Cytogenetic normal. Labs are reviewed and discussed with patient. Lab Results  Component Value Date   HGB 9.8 (L) 12/31/2022   TIBC 204 (L) 12/31/2022   IRONPCTSAT 12 12/31/2022   FERRITIN 344 (H) 12/31/2022    Ferritin is at goal.  Hemoglobin is less than 10.  Recommend Retacrit 10,000 unit today.

## 2023-01-05 NOTE — Progress Notes (Signed)
Hematology/Oncology Progress note [phone] [phone]      CHIEF COMPLAINTS/REASON FOR VISIT:  Anemia due to CKD  ASSESSMENT & PLAN:   Anemia of chronic kidney failure, stage 4 (severe) (HCC) Likely anemia secondary to chronic kidney disease. Previous Bone marrow biopsy results showed hypercellular marrow, no significant dysplastic features.  Cytogenetic normal. Labs are reviewed and discussed with patient. Lab Results  Component Value Date   HGB 9.8 (L) 12/31/2022   TIBC 204 (L) 12/31/2022   IRONPCTSAT 12 12/31/2022   FERRITIN 344 (H) 12/31/2022    Ferritin is at goal.  Hemoglobin is less than 10.  Recommend Retacrit 10,000 unit today.  CKD (chronic kidney disease) stage 4, GFR 15-29 ml/min (HCC) Encourage oral hydration and avoid nephrotoxins.     Orders Placed This Encounter  Procedures   CBC with Differential (Cancer Center Only)    Standing Status:   Future    Standing Expiration Date:   01/05/2024   Iron and TIBC    Standing Status:   Future    Standing Expiration Date:   01/05/2024   Ferritin    Standing Status:   Future    Standing Expiration Date:   01/05/2024   Retic Panel    Standing Status:   Future    Standing Expiration Date:   01/05/2024   Hemoglobin and Hematocrit (Cancer Center Only)    Standing Status:   Future    Standing Expiration Date:   01/05/2024   Follow-up in 3 months  All questions were answered. The patient knows to call the clinic with any problems, questions or concerns.  Rickard Patience, MD, PhD [facility] Hematology Oncology 01/05/2023    HISTORY OF PRESENTING ILLNESS:  Reviewed patient's recent labs that was done.  10/17/2018 labs revealed anemia with hemoglobin of 12.4 Free kappa light chain 5.35, lambda light chain 2.23, kappa lambda ratio 2.31. SPEP showed faint monoclonal component typed as IgG kappa.  Faint band suspicious for monoclonal components No aggravating or improving factors.  Associated  signs and symptoms: Chronic fatigue.  No exacerbating or alleviating factors. . Patient has chronic kidney disease stage III follow-up with [facility] nephrology Dr. 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  Has been on chronic steroids.She reports her current prednisone regimen is 5 mg alternating with 2.5 mg. Chronic joint, neck, back pains.  hospitalized from 02/18/2022 - 02/20/2022 due to anemia.  Patient had extensive GI workup including EGD, colonoscopy, capsule study which were negative.  Patient was seen by Dr. Donneta Romberg during hospitalization.  Patient presented with a hemoglobin of 6.5, status post PRBC transfusion and 1 dose of Venofer 300 mg daily.   02/20/22 Bone marrow biopsy showed hypercellular marrow, no significant dysplastic.  Cytogenetic normal.   INTERVAL HISTORY Rebecca Gilmore is a 82 y.o. female who has above history reviewed by me today presents for follow up of anemia due to CKD She was last seen by me on 02/06/2020. She tolerates IV Venofer treatments. Hb has improved Recent hospitalization due to UTI.  She has finished antibiotics. Patient has aortic stenosis, status post TAVR and pacemaker placement during the interval.  Review of Systems  Constitutional:  Positive for fatigue. Negative for appetite change, chills and fever.  HENT:   Negative for hearing loss and voice change.   Eyes:  Negative for eye problems.  Respiratory:  Negative for chest tightness and cough.   Cardiovascular:  Negative for chest pain.  Gastrointestinal:  Negative for abdominal distention, abdominal  pain and blood in stool.  Endocrine: Negative for hot flashes.  Genitourinary:  Negative for difficulty urinating and frequency.   Musculoskeletal:  Positive for arthralgias, back pain and neck pain.  Skin:  Negative for itching and rash.  Neurological:  Negative for extremity weakness.  Hematological:  Negative for adenopathy.  Psychiatric/Behavioral:  Negative for  confusion.     MEDICAL HISTORY:  Past Medical History:  Diagnosis Date   Anemia    Arrhythmia    atrial fibrillation   CHF (congestive heart failure) (HCC)    Chronic kidney disease    Depression    HBP (high blood pressure)    History of bladder problems    Memory loss    Muscle pain    Osteoporosis    Reflux    Sinus congestion    Swelling    B/L FEET AND LEGS    SURGICAL HISTORY: Past Surgical History:  Procedure Laterality Date   ARTERY BIOPSY Bilateral 12/01/2016   Procedure: BIOPSY TEMPORAL ARTERY;  Surgeon: Fransisco Hertz, MD;  Location: [facility] OR;  Service: Vascular;  Laterality: Bilateral;   CATARACT SURGERY     COLONOSCOPY WITH PROPOFOL N/A 12/13/2021   Procedure: COLONOSCOPY WITH PROPOFOL;  Surgeon: Wyline Mood, MD;  Location: [facility] ENDOSCOPY;  Service: Gastroenterology;  Laterality: N/A;   CORONARY/GRAFT ACUTE MI REVASCULARIZATION N/A 08/05/2022   Procedure: Coronary/Graft Acute MI Revascularization;  Surgeon: Marcina Millard, MD;  Location: [facility] INVASIVE CV LAB;  Service: Cardiovascular;  Laterality: N/A;   ESOPHAGOGASTRODUODENOSCOPY (EGD) WITH PROPOFOL N/A 12/12/2021   Procedure: ESOPHAGOGASTRODUODENOSCOPY (EGD) WITH PROPOFOL;  Surgeon: Jaynie Collins, DO;  Location: [facility] ENDOSCOPY;  Service: Gastroenterology;  Laterality: N/A;   EYELID SURGERY Bilateral    GIVENS CAPSULE STUDY N/A 12/15/2021   Procedure: GIVENS CAPSULE STUDY;  Surgeon: Jaynie Collins, DO;  Location: [facility] ENDOSCOPY;  Service: Gastroenterology;  Laterality: N/A;   GIVENS CAPSULE STUDY N/A 01/28/2022   Procedure: GIVENS CAPSULE STUDY;  Surgeon: Toney Reil, MD;  Location: [facility] ENDOSCOPY;  Service: Gastroenterology;  Laterality: N/A;   GROWTH ON FACE     KNEE SURGERY Right    LEFT HEART CATH AND CORONARY ANGIOGRAPHY N/A 08/05/2022   Procedure: LEFT HEART CATH AND CORONARY ANGIOGRAPHY;  Surgeon: Marcina Millard, MD;  Location: [facility] INVASIVE CV LAB;  Service: Cardiovascular;   Laterality: N/A;   THROMBECTOMY FEMORAL ARTERY Left 11/16/2021   Procedure: THROMBECTOMY FEMORAL ARTERY;  Surgeon: Fransisco Hertz, MD;  Location: [facility] ORS;  Service: Vascular;  Laterality: Left;    SOCIAL HISTORY: Social History   Socioeconomic History   Marital status: Married    Spouse name: Not on file   Number of children: Not on file   Years of education: Not on file   Highest education level: Not on file  Occupational History   Not on file  Tobacco Use   Smoking status: Former    Current packs/day: 0.00    Types: Cigarettes    Quit date: 01/12/1993    Years since quitting: 30.0   Smokeless tobacco: Former    Types: Snuff, Chew   Tobacco comments:    DATE QUIT UNKNOWN  Vaping Use   Vaping status: Never Used  Substance and Sexual Activity   Alcohol use: No   Drug use: No   Sexual activity: Not Currently  Other Topics Concern   Not on file  Social History Narrative   Not on file   Social Determinants of Health   Financial Resource Strain: Low Risk  (  03/07/2021)   Received [facility]   Overall Financial Resource Strain (CARDIA)    Difficulty of Paying Living Expenses: Not hard at all  Food Insecurity: No Food Insecurity (12/24/2022)   Hunger Vital Sign    Worried About Running Out of Food in the Last Year: Never true    Ran Out of Food in the Last Year: Never true  Transportation Needs: No Transportation Needs (12/24/2022)   PRAPARE - Administrator, Civil Service (Medical): No    Lack of Transportation (Non-Medical): No  Physical Activity: Not on file  Stress: Not on file  Social Connections: Not on file  Intimate Partner Violence: Not At Risk (12/24/2022)   Humiliation, Afraid, Rape, and Kick questionnaire    Fear of Current or Ex-Partner: No    Emotionally Abused: No    Physically Abused: No    Sexually Abused: No    FAMILY HISTORY: Family History  Problem Relation Age of Onset   Heart disease Mother    AAA  (abdominal aortic aneurysm) Mother    Heart disease Father     ALLERGIES:  is allergic to chlorhexidine, morphine, other, morphine and codeine, kenalog [triamcinolone acetonide], penicillins, latex, and povidone-iodine.  MEDICATIONS:  Current Outpatient Medications  Medication Sig Dispense Refill   acetaminophen (TYLENOL) 325 MG tablet Take 2 tablets (650 mg total) by mouth every 6 (six) hours as needed for mild pain or fever.     amitriptyline (ELAVIL) 25 MG tablet Take 25 mg by mouth at bedtime as needed.     cholecalciferol (VITAMIN D3) 25 MCG (1000 UT) tablet Take 1,000 Units by mouth daily.     clopidogrel (PLAVIX) 75 MG tablet TAKE 1 TABLET BY MOUTH EVERY DAY 90 tablet 2   cyanocobalamin (VITAMIN B12) 1000 MCG tablet Take 1,000 mcg by mouth daily.     empagliflozin (JARDIANCE) 10 MG TABS tablet Take 1 tablet by mouth daily.     Ferrous Sulfate (IRON) 325 (65 Fe) MG TABS Take 1 tablet by mouth daily.     lidocaine (LIDODERM) 5 % Place 1 patch onto the skin daily. Remove & Discard patch within 12 hours or as directed by MD 30 patch 0   loratadine (CLARITIN) 10 MG tablet Take 10 mg by mouth daily as needed.     nystatin (MYCOSTATIN) 100000 UNIT/ML suspension Take by mouth.     pantoprazole (PROTONIX) 40 MG tablet Take 40 mg by mouth at bedtime.     potassium chloride (MICRO-K) 10 MEQ CR capsule Take by mouth.     pregabalin (LYRICA) 75 MG capsule Take 1 capsule (75 mg total) by mouth daily. 30 capsule 0   rosuvastatin (CRESTOR) 5 MG tablet Take by mouth.     sertraline (ZOLOFT) 50 MG tablet Take 50 mg by mouth daily.  0   torsemide (DEMADEX) 100 MG tablet Take by mouth.     No current facility-administered medications for this visit.     PHYSICAL EXAMINATION: ECOG PERFORMANCE STATUS: 1 - Symptomatic but completely ambulatory Vitals:   01/05/23 1322  BP: 112/66  Pulse: 71  Temp: 98.3 F (36.8 C)  SpO2: 94%   Filed Weights   01/05/23 1322  Weight: 211 lb 9.6 oz (96 kg)     Physical Exam Constitutional:      General: She is not in acute distress.    Appearance: She is obese.  HENT:     Head: Normocephalic and atraumatic.  Eyes:  General: No scleral icterus.    Pupils: Pupils are equal, round, and reactive to light.  Cardiovascular:     Rate and Rhythm: Normal rate.     Heart sounds: Murmur heard.  Pulmonary:     Effort: Pulmonary effort is normal. No respiratory distress.  Abdominal:     General: Bowel sounds are normal. There is no distension.     Palpations: Abdomen is soft.  Musculoskeletal:        General: No deformity. Normal range of motion.     Cervical back: Normal range of motion.  Skin:    General: Skin is warm and dry.     Findings: No erythema or rash.  Neurological:     Mental Status: She is alert and oriented to person, place, and time. Mental status is at baseline.  Psychiatric:        Thought Content: Thought content normal.      LABORATORY DATA:  I have reviewed the data as listed     Latest Ref Rng & Units 12/31/2022   12:54 PM 12/27/2022    4:15 AM 12/26/2022    8:16 AM  CBC  WBC 4.0 - 10.5 K/uL 9.0  5.0  7.7   Hemoglobin 12.0 - 15.0 g/dL 9.8  9.4  38.7   Hematocrit 36.0 - 46.0 % 30.4  29.0  31.8   Platelets 150 - 400 K/uL 253  182  172       Latest Ref Rng & Units 12/28/2022    4:11 AM 12/27/2022    4:15 AM 12/26/2022    8:16 AM  CMP  Glucose 70 - 99 mg/dL 95  90  87   BUN 8 - 23 mg/dL 21  27  32   Creatinine 0.44 - 1.00 mg/dL 5.64  3.32  9.51   Sodium 135 - 145 mmol/L 135  134  134   Potassium 3.5 - 5.1 mmol/L 3.9  3.9  3.7   Chloride 98 - 111 mmol/L 104  103  104   CO2 22 - 32 mmol/L 24  24  21    Calcium 8.9 - 10.3 mg/dL 8.3  7.9  8.0     Iron/TIBC/Ferritin/ %Sat    Component Value Date/Time   IRON 25 (L) 12/31/2022 1254   TIBC 204 (L) 12/31/2022 1254   FERRITIN 344 (H) 12/31/2022 1254   IRONPCTSAT 12 12/31/2022 1254

## 2023-01-06 DIAGNOSIS — G9341 Metabolic encephalopathy: Secondary | ICD-10-CM | POA: Diagnosis not present

## 2023-01-06 DIAGNOSIS — N390 Urinary tract infection, site not specified: Secondary | ICD-10-CM | POA: Diagnosis not present

## 2023-01-06 DIAGNOSIS — R6520 Severe sepsis without septic shock: Secondary | ICD-10-CM | POA: Diagnosis not present

## 2023-01-06 DIAGNOSIS — N179 Acute kidney failure, unspecified: Secondary | ICD-10-CM | POA: Diagnosis not present

## 2023-01-07 DIAGNOSIS — G9341 Metabolic encephalopathy: Secondary | ICD-10-CM | POA: Diagnosis not present

## 2023-01-07 DIAGNOSIS — R6520 Severe sepsis without septic shock: Secondary | ICD-10-CM | POA: Diagnosis not present

## 2023-01-07 DIAGNOSIS — N390 Urinary tract infection, site not specified: Secondary | ICD-10-CM | POA: Diagnosis not present

## 2023-01-07 DIAGNOSIS — N179 Acute kidney failure, unspecified: Secondary | ICD-10-CM | POA: Diagnosis not present

## 2023-01-08 ENCOUNTER — Ambulatory Visit: Payer: Self-pay

## 2023-01-08 DIAGNOSIS — R6520 Severe sepsis without septic shock: Secondary | ICD-10-CM | POA: Diagnosis not present

## 2023-01-08 DIAGNOSIS — N390 Urinary tract infection, site not specified: Secondary | ICD-10-CM | POA: Diagnosis not present

## 2023-01-08 DIAGNOSIS — G9341 Metabolic encephalopathy: Secondary | ICD-10-CM | POA: Diagnosis not present

## 2023-01-08 DIAGNOSIS — N179 Acute kidney failure, unspecified: Secondary | ICD-10-CM | POA: Diagnosis not present

## 2023-01-08 NOTE — Patient Instructions (Signed)
Visit Information  Thank you for taking time to visit with me today. Please don't hesitate to contact me if I can be of assistance to you.   Following are the goals we discussed today:   Goals Addressed             This Visit's Progress    COMPLETED: Care Coordination Activities- HF,CKD       Care Coordination Interventions: Evaluation of current treatment plan related to Heart Failure, Cystitis, CKD and patient's adherence to plan as established by provider  Spoke with daughter Victorino Dike. She reports patient doing well.  She states her mom has several chronic conditions but manages.  PCP visit earlier this week.  Home health with [facility] active with patient. Patient able to manage medications and is independent with her care.  She does utilize her rollator to get around.  She lives in the home with spouse.  Grandson lives right behind them and able to offer support as needed. Discussed CM follow up for further support.  Declined at this time. She has CM information for future reference.            If you are experiencing a Mental Health or Behavioral Health Crisis or need someone to talk to, please call the Suicide and Crisis Lifeline: 988   Patient verbalizes understanding of instructions and care plan provided today and agrees to view in MyChart. Active MyChart status and patient understanding of how to access instructions and care plan via MyChart confirmed with patient.     The patient has been provided with contact information for the care management team and has been advised to call with any health related questions or concerns.   Bary Leriche, RN, MSN [facility], [facility] Management Community Coordinator Direct Dial: [phone]  Fax: [phone] Website: [email or web address]

## 2023-01-08 NOTE — Patient Outreach (Signed)
  Care Coordination   Initial Visit Note   01/08/2023 Name: Rebecca Gilmore MRN: [id number] DOB: 10/02/1940  Rebecca Gilmore is a 82 y.o. year old female who sees Hamrick, Durward Fortes, MD for primary care. I  spoke with daughter Victorino Dike today by phone.  What matters to the patients health and wellness today?  Maintaining health    Goals Addressed             This Visit's Progress    COMPLETED: Care Coordination Activities- HF,CKD       Care Coordination Interventions: Evaluation of current treatment plan related to Heart Failure, Cystitis, CKD and patient's adherence to plan as established by provider  Spoke with daughter Victorino Dike. She reports patient doing well.  She states her mom has several chronic conditions but manages.  PCP visit earlier this week.  [facility] health with [facility] active with patient. Patient able to manage medications and is independent with her care.  She does utilize her rollator to get around.  She lives in the home with spouse.  Grandson lives right behind them and able to offer support as needed. Discussed CM follow up for further support.  Declined at this time. She has CM information for future reference.          SDOH assessments and interventions completed:  Yes  SDOH Interventions Today    Flowsheet Row Most Recent Value  SDOH Interventions   Food Insecurity Interventions Intervention Not Indicated  Housing Interventions Intervention Not Indicated  Transportation Interventions Intervention Not Indicated        Care Coordination Interventions:  Yes, provided   Follow up plan: No further intervention required.   Encounter Outcome:  Patient Visit Completed   Bary Leriche, RN, MSN [facility]  [facility], [facility] Management Community Coordinator Direct Dial: [phone]  Fax: [phone] Website: [email or web address]

## 2023-01-11 DIAGNOSIS — R6520 Severe sepsis without septic shock: Secondary | ICD-10-CM | POA: Diagnosis not present

## 2023-01-11 DIAGNOSIS — N179 Acute kidney failure, unspecified: Secondary | ICD-10-CM | POA: Diagnosis not present

## 2023-01-11 DIAGNOSIS — N390 Urinary tract infection, site not specified: Secondary | ICD-10-CM | POA: Diagnosis not present

## 2023-01-11 DIAGNOSIS — G9341 Metabolic encephalopathy: Secondary | ICD-10-CM | POA: Diagnosis not present

## 2023-01-14 DIAGNOSIS — N179 Acute kidney failure, unspecified: Secondary | ICD-10-CM | POA: Diagnosis not present

## 2023-01-14 DIAGNOSIS — N390 Urinary tract infection, site not specified: Secondary | ICD-10-CM | POA: Diagnosis not present

## 2023-01-14 DIAGNOSIS — R6520 Severe sepsis without septic shock: Secondary | ICD-10-CM | POA: Diagnosis not present

## 2023-01-14 DIAGNOSIS — G9341 Metabolic encephalopathy: Secondary | ICD-10-CM | POA: Diagnosis not present

## 2023-01-15 DIAGNOSIS — G9341 Metabolic encephalopathy: Secondary | ICD-10-CM | POA: Diagnosis not present

## 2023-01-15 DIAGNOSIS — N179 Acute kidney failure, unspecified: Secondary | ICD-10-CM | POA: Diagnosis not present

## 2023-01-15 DIAGNOSIS — N390 Urinary tract infection, site not specified: Secondary | ICD-10-CM | POA: Diagnosis not present

## 2023-01-15 DIAGNOSIS — R6520 Severe sepsis without septic shock: Secondary | ICD-10-CM | POA: Diagnosis not present

## 2023-01-16 DIAGNOSIS — I214 Non-ST elevation (NSTEMI) myocardial infarction: Secondary | ICD-10-CM | POA: Diagnosis not present

## 2023-01-19 DIAGNOSIS — G9341 Metabolic encephalopathy: Secondary | ICD-10-CM | POA: Diagnosis not present

## 2023-01-19 DIAGNOSIS — N390 Urinary tract infection, site not specified: Secondary | ICD-10-CM | POA: Diagnosis not present

## 2023-01-19 DIAGNOSIS — R6520 Severe sepsis without septic shock: Secondary | ICD-10-CM | POA: Diagnosis not present

## 2023-01-19 DIAGNOSIS — N179 Acute kidney failure, unspecified: Secondary | ICD-10-CM | POA: Diagnosis not present

## 2023-01-21 DIAGNOSIS — N390 Urinary tract infection, site not specified: Secondary | ICD-10-CM | POA: Diagnosis not present

## 2023-01-21 DIAGNOSIS — N179 Acute kidney failure, unspecified: Secondary | ICD-10-CM | POA: Diagnosis not present

## 2023-01-21 DIAGNOSIS — R6520 Severe sepsis without septic shock: Secondary | ICD-10-CM | POA: Diagnosis not present

## 2023-01-21 DIAGNOSIS — G9341 Metabolic encephalopathy: Secondary | ICD-10-CM | POA: Diagnosis not present

## 2023-01-22 DIAGNOSIS — N390 Urinary tract infection, site not specified: Secondary | ICD-10-CM | POA: Diagnosis not present

## 2023-01-22 DIAGNOSIS — R6520 Severe sepsis without septic shock: Secondary | ICD-10-CM | POA: Diagnosis not present

## 2023-01-22 DIAGNOSIS — G9341 Metabolic encephalopathy: Secondary | ICD-10-CM | POA: Diagnosis not present

## 2023-01-22 DIAGNOSIS — N179 Acute kidney failure, unspecified: Secondary | ICD-10-CM | POA: Diagnosis not present

## 2023-01-27 DIAGNOSIS — N179 Acute kidney failure, unspecified: Secondary | ICD-10-CM | POA: Diagnosis not present

## 2023-01-27 DIAGNOSIS — G9341 Metabolic encephalopathy: Secondary | ICD-10-CM | POA: Diagnosis not present

## 2023-01-27 DIAGNOSIS — N390 Urinary tract infection, site not specified: Secondary | ICD-10-CM | POA: Diagnosis not present

## 2023-01-27 DIAGNOSIS — R6520 Severe sepsis without septic shock: Secondary | ICD-10-CM | POA: Diagnosis not present

## 2023-02-15 DIAGNOSIS — I214 Non-ST elevation (NSTEMI) myocardial infarction: Secondary | ICD-10-CM | POA: Diagnosis not present

## 2023-02-16 ENCOUNTER — Inpatient Hospital Stay: Payer: Medicare Other | Attending: Oncology

## 2023-02-16 ENCOUNTER — Inpatient Hospital Stay: Payer: Medicare Other

## 2023-02-16 DIAGNOSIS — D631 Anemia in chronic kidney disease: Secondary | ICD-10-CM | POA: Diagnosis not present

## 2023-02-16 DIAGNOSIS — N184 Chronic kidney disease, stage 4 (severe): Secondary | ICD-10-CM | POA: Diagnosis not present

## 2023-02-16 LAB — HEMOGLOBIN AND HEMATOCRIT (CANCER CENTER ONLY)
HCT: 32.8 % — ABNORMAL LOW (ref 36.0–46.0)
Hemoglobin: 10.4 g/dL — ABNORMAL LOW (ref 12.0–15.0)

## 2023-03-16 DIAGNOSIS — I5020 Unspecified systolic (congestive) heart failure: Secondary | ICD-10-CM | POA: Diagnosis not present

## 2023-03-18 DIAGNOSIS — I214 Non-ST elevation (NSTEMI) myocardial infarction: Secondary | ICD-10-CM | POA: Diagnosis not present

## 2023-04-06 DIAGNOSIS — I10 Essential (primary) hypertension: Secondary | ICD-10-CM | POA: Diagnosis not present

## 2023-04-06 DIAGNOSIS — Z952 Presence of prosthetic heart valve: Secondary | ICD-10-CM | POA: Diagnosis not present

## 2023-04-06 DIAGNOSIS — I2510 Atherosclerotic heart disease of native coronary artery without angina pectoris: Secondary | ICD-10-CM | POA: Diagnosis not present

## 2023-04-06 DIAGNOSIS — R001 Bradycardia, unspecified: Secondary | ICD-10-CM | POA: Diagnosis not present

## 2023-04-06 DIAGNOSIS — E785 Hyperlipidemia, unspecified: Secondary | ICD-10-CM | POA: Diagnosis not present

## 2023-04-06 DIAGNOSIS — R0602 Shortness of breath: Secondary | ICD-10-CM | POA: Diagnosis not present

## 2023-04-06 DIAGNOSIS — Z950 Presence of cardiac pacemaker: Secondary | ICD-10-CM | POA: Diagnosis not present

## 2023-04-06 DIAGNOSIS — I214 Non-ST elevation (NSTEMI) myocardial infarction: Secondary | ICD-10-CM | POA: Diagnosis not present

## 2023-04-06 DIAGNOSIS — I5033 Acute on chronic diastolic (congestive) heart failure: Secondary | ICD-10-CM | POA: Diagnosis not present

## 2023-04-06 DIAGNOSIS — I4892 Unspecified atrial flutter: Secondary | ICD-10-CM | POA: Diagnosis not present

## 2023-04-06 DIAGNOSIS — I4891 Unspecified atrial fibrillation: Secondary | ICD-10-CM | POA: Diagnosis not present

## 2023-04-06 DIAGNOSIS — I453 Trifascicular block: Secondary | ICD-10-CM | POA: Diagnosis not present

## 2023-04-07 ENCOUNTER — Inpatient Hospital Stay: Payer: Medicare Other

## 2023-04-07 DIAGNOSIS — L89153 Pressure ulcer of sacral region, stage 3: Secondary | ICD-10-CM | POA: Diagnosis not present

## 2023-04-07 DIAGNOSIS — D649 Anemia, unspecified: Secondary | ICD-10-CM | POA: Diagnosis not present

## 2023-04-07 DIAGNOSIS — I5030 Unspecified diastolic (congestive) heart failure: Secondary | ICD-10-CM | POA: Diagnosis not present

## 2023-04-07 DIAGNOSIS — N184 Chronic kidney disease, stage 4 (severe): Secondary | ICD-10-CM | POA: Diagnosis not present

## 2023-04-07 DIAGNOSIS — I739 Peripheral vascular disease, unspecified: Secondary | ICD-10-CM | POA: Diagnosis not present

## 2023-04-07 DIAGNOSIS — Z79899 Other long term (current) drug therapy: Secondary | ICD-10-CM | POA: Diagnosis not present

## 2023-04-07 DIAGNOSIS — E782 Mixed hyperlipidemia: Secondary | ICD-10-CM | POA: Diagnosis not present

## 2023-04-07 DIAGNOSIS — I350 Nonrheumatic aortic (valve) stenosis: Secondary | ICD-10-CM | POA: Diagnosis not present

## 2023-04-07 DIAGNOSIS — K219 Gastro-esophageal reflux disease without esophagitis: Secondary | ICD-10-CM | POA: Diagnosis not present

## 2023-04-07 DIAGNOSIS — R202 Paresthesia of skin: Secondary | ICD-10-CM | POA: Diagnosis not present

## 2023-04-07 DIAGNOSIS — I482 Chronic atrial fibrillation, unspecified: Secondary | ICD-10-CM | POA: Diagnosis not present

## 2023-04-08 ENCOUNTER — Inpatient Hospital Stay: Payer: Medicare Other | Attending: Oncology

## 2023-04-08 DIAGNOSIS — D631 Anemia in chronic kidney disease: Secondary | ICD-10-CM | POA: Insufficient documentation

## 2023-04-08 DIAGNOSIS — N184 Chronic kidney disease, stage 4 (severe): Secondary | ICD-10-CM | POA: Diagnosis not present

## 2023-04-08 LAB — CBC WITH DIFFERENTIAL (CANCER CENTER ONLY)
Abs Immature Granulocytes: 0.06 10*3/uL (ref 0.00–0.07)
Basophils Absolute: 0 10*3/uL (ref 0.0–0.1)
Basophils Relative: 1 %
Eosinophils Absolute: 0.4 10*3/uL (ref 0.0–0.5)
Eosinophils Relative: 7 %
HCT: 34.4 % — ABNORMAL LOW (ref 36.0–46.0)
Hemoglobin: 10.9 g/dL — ABNORMAL LOW (ref 12.0–15.0)
Immature Granulocytes: 1 %
Lymphocytes Relative: 29 %
Lymphs Abs: 1.5 10*3/uL (ref 0.7–4.0)
MCH: 28.5 pg (ref 26.0–34.0)
MCHC: 31.7 g/dL (ref 30.0–36.0)
MCV: 89.8 fL (ref 80.0–100.0)
Monocytes Absolute: 0.4 10*3/uL (ref 0.1–1.0)
Monocytes Relative: 7 %
Neutro Abs: 2.9 10*3/uL (ref 1.7–7.7)
Neutrophils Relative %: 55 %
Platelet Count: 163 10*3/uL (ref 150–400)
RBC: 3.83 MIL/uL — ABNORMAL LOW (ref 3.87–5.11)
RDW: 14.5 % (ref 11.5–15.5)
WBC Count: 5.3 10*3/uL (ref 4.0–10.5)
nRBC: 0 % (ref 0.0–0.2)

## 2023-04-08 LAB — IRON AND TIBC
Iron: 52 ug/dL (ref 28–170)
Saturation Ratios: 22 % (ref 10.4–31.8)
TIBC: 238 ug/dL — ABNORMAL LOW (ref 250–450)
UIBC: 186 ug/dL

## 2023-04-08 LAB — RETIC PANEL
Immature Retic Fract: 7.2 % (ref 2.3–15.9)
RBC.: 3.77 MIL/uL — ABNORMAL LOW (ref 3.87–5.11)
Retic Count, Absolute: 44.5 10*3/uL (ref 19.0–186.0)
Retic Ct Pct: 1.2 % (ref 0.4–3.1)
Reticulocyte Hemoglobin: 31.9 pg (ref >27.9)

## 2023-04-08 LAB — FERRITIN: Ferritin: 291 ng/mL (ref 11–307)

## 2023-04-13 ENCOUNTER — Inpatient Hospital Stay: Payer: Medicare Other

## 2023-04-13 ENCOUNTER — Inpatient Hospital Stay: Payer: Medicare Other | Attending: Oncology | Admitting: Oncology

## 2023-04-13 ENCOUNTER — Encounter: Payer: Self-pay | Admitting: Oncology

## 2023-04-13 VITALS — BP 107/63 | HR 70 | Temp 96.8°F | Resp 18 | Wt 200.4 lb

## 2023-04-13 DIAGNOSIS — D631 Anemia in chronic kidney disease: Secondary | ICD-10-CM | POA: Insufficient documentation

## 2023-04-13 DIAGNOSIS — N184 Chronic kidney disease, stage 4 (severe): Secondary | ICD-10-CM | POA: Diagnosis not present

## 2023-04-13 NOTE — Progress Notes (Signed)
 Hematology/Oncology Progress note [phone] [phone]      CHIEF COMPLAINTS/REASON FOR VISIT:  Anemia due to CKD  ASSESSMENT & PLAN:   Anemia of chronic kidney failure, stage 4 (severe) (HCC) Likely anemia secondary to chronic kidney disease. Previous Bone marrow biopsy results showed hypercellular marrow, no significant dysplastic features.  Cytogenetic normal. Labs are reviewed and discussed with patient. Lab Results  Component Value Date   HGB 10.9 (L) 04/08/2023   TIBC 238 (L) 04/08/2023   IRONPCTSAT 22 04/08/2023   FERRITIN 291 04/08/2023    Ferritin is at goal.  Hemoglobin > 10.  No need for  Retacrit  Continue oral iron  supplementation.    Orders Placed This Encounter  Procedures   CBC with Differential (Cancer Center Only)    Standing Status:   Future    Expected Date:   07/11/2023    Expiration Date:   04/12/2024   Iron  and TIBC    Standing Status:   Future    Expected Date:   07/11/2023    Expiration Date:   04/12/2024   Ferritin    Standing Status:   Future    Expected Date:   07/11/2023    Expiration Date:   04/12/2024   Retic Panel    Standing Status:   Future    Expected Date:   07/11/2023    Expiration Date:   04/12/2024   Follow-up in 3 months  All questions were answered. The patient knows to call the clinic with any problems, questions or concerns.  Zelphia Cap, MD, PhD [facility] Hematology Oncology 04/13/2023    HISTORY OF PRESENTING ILLNESS:  Reviewed patient's recent labs that was done.  10/17/2018 labs revealed anemia with hemoglobin of 12.4 Free kappa light chain 5.35, lambda light chain 2.23, kappa lambda ratio 2.31. SPEP showed faint monoclonal component typed as IgG kappa.  Faint band suspicious for monoclonal components No aggravating or improving factors.  Associated signs and symptoms: Chronic fatigue.  No exacerbating or alleviating factors. . Patient has chronic kidney disease stage III follow-up with [facility] nephrology Dr.  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  Has been on chronic steroids.She reports her current prednisone  regimen is 5 mg alternating with 2.5 mg. Chronic joint, neck, back pains.  hospitalized from 02/18/2022 - 02/20/2022 due to anemia.  Patient had extensive GI workup including EGD, colonoscopy, capsule study which were negative.  Patient was seen by Dr. Rennie during hospitalization.  Patient presented with a hemoglobin of 6.5, status post PRBC transfusion and 1 dose of Venofer  300 mg daily.   02/20/22 Bone marrow biopsy showed hypercellular marrow, no significant dysplastic.  Cytogenetic normal.  08/12/2022 Patient has aortic stenosis, status post TAVR and pacemaker placement   INTERVAL HISTORY Rebecca Gilmore is a 83 y.o. female who has above history reviewed by me today presents for follow up of anemia due to CKD She was last seen by me on 02/06/2020. She tolerates IV Venofer  treatments. No new complaints. She takes oral iron  supplementation.    Review of Systems  Constitutional:  Positive for fatigue. Negative for appetite change, chills and fever.  HENT:   Negative for hearing loss and voice change.   Eyes:  Negative for eye problems.  Respiratory:  Negative for chest tightness and cough.   Cardiovascular:  Negative for chest pain.  Gastrointestinal:  Negative for abdominal distention, abdominal pain and blood in stool.  Endocrine: Negative for hot flashes.  Genitourinary:  Negative for difficulty urinating  and frequency.   Musculoskeletal:  Positive for arthralgias, back pain and neck pain.  Skin:  Negative for itching and rash.  Neurological:  Negative for extremity weakness.  Hematological:  Negative for adenopathy.  Psychiatric/Behavioral:  Negative for confusion.     MEDICAL HISTORY:  Past Medical History:  Diagnosis Date   Anemia    Arrhythmia    atrial fibrillation   CHF (congestive heart failure) (HCC)    Chronic kidney disease     Depression    HBP (high blood pressure)    History of bladder problems    Memory loss    Muscle pain    Osteoporosis    Reflux    Sinus congestion    Swelling    B/L FEET AND LEGS    SURGICAL HISTORY: Past Surgical History:  Procedure Laterality Date   ARTERY BIOPSY Bilateral 12/01/2016   Procedure: BIOPSY TEMPORAL ARTERY;  Surgeon: Laurence Redell CROME, MD;  Location: [facility] OR;  Service: Vascular;  Laterality: Bilateral;   CATARACT SURGERY     COLONOSCOPY WITH PROPOFOL  N/A 12/13/2021   Procedure: COLONOSCOPY WITH PROPOFOL ;  Surgeon: Therisa Bi, MD;  Location: [facility] ENDOSCOPY;  Service: Gastroenterology;  Laterality: N/A;   CORONARY/GRAFT ACUTE MI REVASCULARIZATION N/A 08/05/2022   Procedure: Coronary/Graft Acute MI Revascularization;  Surgeon: Ammon Blunt, MD;  Location: [facility] INVASIVE CV LAB;  Service: Cardiovascular;  Laterality: N/A;   ESOPHAGOGASTRODUODENOSCOPY (EGD) WITH PROPOFOL  N/A 12/12/2021   Procedure: ESOPHAGOGASTRODUODENOSCOPY (EGD) WITH PROPOFOL ;  Surgeon: Onita Elspeth Sharper, DO;  Location: [facility] ENDOSCOPY;  Service: Gastroenterology;  Laterality: N/A;   EYELID SURGERY Bilateral    GIVENS CAPSULE STUDY N/A 12/15/2021   Procedure: GIVENS CAPSULE STUDY;  Surgeon: Onita Elspeth Sharper, DO;  Location: [facility] ENDOSCOPY;  Service: Gastroenterology;  Laterality: N/A;   GIVENS CAPSULE STUDY N/A 01/28/2022   Procedure: GIVENS CAPSULE STUDY;  Surgeon: Unk Corinn Skiff, MD;  Location: [facility] ENDOSCOPY;  Service: Gastroenterology;  Laterality: N/A;   GROWTH ON FACE     KNEE SURGERY Right    LEFT HEART CATH AND CORONARY ANGIOGRAPHY N/A 08/05/2022   Procedure: LEFT HEART CATH AND CORONARY ANGIOGRAPHY;  Surgeon: Ammon Blunt, MD;  Location: [facility] INVASIVE CV LAB;  Service: Cardiovascular;  Laterality: N/A;   THROMBECTOMY FEMORAL ARTERY Left 11/16/2021   Procedure: THROMBECTOMY FEMORAL ARTERY;  Surgeon: Laurence Redell CROME, MD;  Location: [facility] ORS;  Service: Vascular;  Laterality: Left;     SOCIAL HISTORY: Social History   Socioeconomic History   Marital status: Married    Spouse name: Not on file   Number of children: Not on file   Years of education: Not on file   Highest education level: Not on file  Occupational History   Not on file  Tobacco Use   Smoking status: Former    Current packs/day: 0.00    Types: Cigarettes    Quit date: 01/12/1993    Years since quitting: 30.2   Smokeless tobacco: Former    Types: Snuff, Chew   Tobacco comments:    DATE QUIT UNKNOWN  Vaping Use   Vaping status: Never Used  Substance and Sexual Activity   Alcohol use: No   Drug use: No   Sexual activity: Not Currently  Other Topics Concern   Not on file  Social History Narrative   Not on file   Social Drivers of Health   Financial Resource Strain: Low Risk  (03/07/2021)   Received from [facility], [facility]   Overall Financial Resource Strain (  CARDIA)    Difficulty of Paying Living Expenses: Not hard at all  Food Insecurity: No Food Insecurity (01/08/2023)   Hunger Vital Sign    Worried About Running Out of Food in the Last Year: Never true    Ran Out of Food in the Last Year: Never true  Transportation Needs: No Transportation Needs (01/08/2023)   PRAPARE - Administrator, Civil Service (Medical): No    Lack of Transportation (Non-Medical): No  Physical Activity: Not on file  Stress: Not on file  Social Connections: Not on file  Intimate Partner Violence: Not At Risk (12/24/2022)   Humiliation, Afraid, Rape, and Kick questionnaire    Fear of Current or Ex-Partner: No    Emotionally Abused: No    Physically Abused: No    Sexually Abused: No    FAMILY HISTORY: Family History  Problem Relation Age of Onset   Heart disease Mother    AAA (abdominal aortic aneurysm) Mother    Heart disease Father     ALLERGIES:  is allergic to chlorhexidine , morphine , other, morphine  and codeine, kenalog  [triamcinolone  acetonide], penicillins, latex, and  povidone-iodine.  MEDICATIONS:  Current Outpatient Medications  Medication Sig Dispense Refill   acetaminophen  (TYLENOL ) 325 MG tablet Take 2 tablets (650 mg total) by mouth every 6 (six) hours as needed for mild pain or fever.     amitriptyline  (ELAVIL ) 25 MG tablet Take 25 mg by mouth at bedtime as needed.     cholecalciferol  (VITAMIN D3) 25 MCG (1000 UT) tablet Take 1,000 Units by mouth daily.     clopidogrel  (PLAVIX ) 75 MG tablet TAKE 1 TABLET BY MOUTH EVERY DAY 90 tablet 2   cyanocobalamin  (VITAMIN B12) 1000 MCG tablet Take 1,000 mcg by mouth daily.     Ferrous Sulfate  (IRON ) 325 (65 Fe) MG TABS Take 1 tablet by mouth daily.     lidocaine  (LIDODERM ) 5 % Place 1 patch onto the skin daily. Remove & Discard patch within 12 hours or as directed by MD 30 patch 0   loratadine  (CLARITIN ) 10 MG tablet Take 10 mg by mouth daily as needed.     nystatin (MYCOSTATIN) 100000 UNIT/ML suspension Take by mouth.     pantoprazole  (PROTONIX ) 40 MG tablet Take 40 mg by mouth at bedtime.     potassium chloride  (MICRO-K ) 10 MEQ CR capsule Take by mouth.     pregabalin  (LYRICA ) 75 MG capsule Take 1 capsule (75 mg total) by mouth daily. 30 capsule 0   rosuvastatin  (CRESTOR ) 5 MG tablet Take by mouth.     sertraline  (ZOLOFT ) 50 MG tablet Take 50 mg by mouth daily.  0   torsemide  (DEMADEX ) 100 MG tablet Take by mouth.     empagliflozin (JARDIANCE) 10 MG TABS tablet Take 1 tablet by mouth daily. (Patient not taking: Reported on 04/13/2023)     No current facility-administered medications for this visit.     PHYSICAL EXAMINATION: ECOG PERFORMANCE STATUS: 1 - Symptomatic but completely ambulatory Vitals:   04/13/23 1324  BP: 107/63  Pulse: 70  Resp: 18  Temp: (!) 96.8 F (36 C)  SpO2: 98%   Filed Weights   04/13/23 1324  Weight: 200 lb 6.4 oz (90.9 kg)    Physical Exam Constitutional:      General: She is not in acute distress.    Appearance: She is obese.  HENT:     Head: Normocephalic and  atraumatic.  Eyes:     General: No scleral icterus.  Pupils: Pupils are equal, round, and reactive to light.  Cardiovascular:     Rate and Rhythm: Normal rate.     Heart sounds: Murmur heard.  Pulmonary:     Effort: Pulmonary effort is normal. No respiratory distress.  Abdominal:     General: Bowel sounds are normal. There is no distension.     Palpations: Abdomen is soft.  Musculoskeletal:        General: No deformity. Normal range of motion.     Cervical back: Normal range of motion.  Skin:    General: Skin is warm and dry.     Findings: No erythema or rash.  Neurological:     Mental Status: She is alert and oriented to person, place, and time. Mental status is at baseline.  Psychiatric:        Thought Content: Thought content normal.      LABORATORY DATA:  I have reviewed the data as listed     Latest Ref Rng & Units 04/08/2023    1:06 PM 02/16/2023    1:54 PM 12/31/2022   12:54 PM  CBC  WBC 4.0 - 10.5 K/uL 5.3   9.0   Hemoglobin 12.0 - 15.0 g/dL 89.0  89.5  9.8   Hematocrit 36.0 - 46.0 % 34.4  32.8  30.4   Platelets 150 - 400 K/uL 163   253       Latest Ref Rng & Units 12/28/2022    4:11 AM 12/27/2022    4:15 AM 12/26/2022    8:16 AM  CMP  Glucose 70 - 99 mg/dL 95  90  87   BUN 8 - 23 mg/dL 21  27  32   Creatinine 0.44 - 1.00 mg/dL 8.58  8.39  8.01   Sodium 135 - 145 mmol/L 135  134  134   Potassium 3.5 - 5.1 mmol/L 3.9  3.9  3.7   Chloride 98 - 111 mmol/L 104  103  104   CO2 22 - 32 mmol/L 24  24  21    Calcium  8.9 - 10.3 mg/dL 8.3  7.9  8.0     Iron /TIBC/Ferritin/ %Sat    Component Value Date/Time   IRON  52 04/08/2023 1306   TIBC 238 (L) 04/08/2023 1306   FERRITIN 291 04/08/2023 1306   IRONPCTSAT 22 04/08/2023 1306

## 2023-04-13 NOTE — Assessment & Plan Note (Addendum)
 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 Previous Bone marrow biopsy results showed hypercellular marrow, no significant dysplastic features.  Cytogenetic normal. Labs are reviewed and discussed with patient. Lab Results  Component Value Date   HGB 10.9 (L) 04/08/2023   TIBC 238 (L) 04/08/2023   IRONPCTSAT 22 04/08/2023   FERRITIN 291 04/08/2023    Ferritin is at goal.  Hemoglobin > 10.  No need for  Retacrit  Continue oral iron  supplementation.

## 2023-04-18 DIAGNOSIS — I214 Non-ST elevation (NSTEMI) myocardial infarction: Secondary | ICD-10-CM | POA: Diagnosis not present

## 2023-05-13 DIAGNOSIS — L89152 Pressure ulcer of sacral region, stage 2: Secondary | ICD-10-CM | POA: Diagnosis not present

## 2023-05-13 DIAGNOSIS — M79644 Pain in right finger(s): Secondary | ICD-10-CM | POA: Diagnosis not present

## 2023-05-13 DIAGNOSIS — R233 Spontaneous ecchymoses: Secondary | ICD-10-CM | POA: Diagnosis not present

## 2023-05-13 DIAGNOSIS — M79645 Pain in left finger(s): Secondary | ICD-10-CM | POA: Diagnosis not present

## 2023-05-16 DIAGNOSIS — I214 Non-ST elevation (NSTEMI) myocardial infarction: Secondary | ICD-10-CM | POA: Diagnosis not present

## 2023-06-07 DIAGNOSIS — I4819 Other persistent atrial fibrillation: Secondary | ICD-10-CM | POA: Diagnosis not present

## 2023-06-07 DIAGNOSIS — Z950 Presence of cardiac pacemaker: Secondary | ICD-10-CM | POA: Diagnosis not present

## 2023-06-07 DIAGNOSIS — Z45018 Encounter for adjustment and management of other part of cardiac pacemaker: Secondary | ICD-10-CM | POA: Diagnosis not present

## 2023-06-07 DIAGNOSIS — Z952 Presence of prosthetic heart valve: Secondary | ICD-10-CM | POA: Diagnosis not present

## 2023-06-08 ENCOUNTER — Ambulatory Visit (INDEPENDENT_AMBULATORY_CARE_PROVIDER_SITE_OTHER): Payer: Medicare Other

## 2023-06-08 ENCOUNTER — Ambulatory Visit (INDEPENDENT_AMBULATORY_CARE_PROVIDER_SITE_OTHER): Payer: Medicare Other | Admitting: Nurse Practitioner

## 2023-06-08 ENCOUNTER — Encounter (INDEPENDENT_AMBULATORY_CARE_PROVIDER_SITE_OTHER): Payer: Self-pay | Admitting: Nurse Practitioner

## 2023-06-08 VITALS — BP 118/59 | HR 70 | Resp 16

## 2023-06-08 DIAGNOSIS — M25552 Pain in left hip: Secondary | ICD-10-CM | POA: Diagnosis not present

## 2023-06-08 DIAGNOSIS — I70202 Unspecified atherosclerosis of native arteries of extremities, left leg: Secondary | ICD-10-CM | POA: Diagnosis not present

## 2023-06-08 DIAGNOSIS — E785 Hyperlipidemia, unspecified: Secondary | ICD-10-CM

## 2023-06-08 NOTE — Progress Notes (Signed)
 Subjective:    Patient ID: Rebecca Gilmore, female    DOB: [birth year], 83 y.o.   MRN: [id number] Chief Complaint  Patient presents with   Follow-up    6 month abi follow up     Rebecca Gilmore is an 83 year old female who follows up today in regards to her endovascular interventions.  About a year and a half ago she underwent a left femoral endarterectomy.  She has some issues with anemia and bleeds during that time however she has been on Plavix and been tolerating this well.  Her largest issue comes in the form of her hip pain.  She currently needs a hip replacement but she was told that because of her previous stent placement in her iliac vessels she is not a candidate for hip replacement.  She is having significant pain with this but she does not have claudication or open wounds or ulcerations.  Today the patient has an ABI 1.24 bilaterally.  She has good toe pressures bilaterally as well as strong triphasic waveforms and good toe waveforms.  Previously she had a right ABI of 1.19 and a left of 1.13.    Review of Systems  Constitutional:  Positive for fatigue.  Musculoskeletal:  Positive for arthralgias.  Neurological:  Positive for weakness.  All other systems reviewed and are negative.      Objective:   Physical Exam Vitals reviewed.  HENT:     Head: Normocephalic.  Cardiovascular:     Rate and Rhythm: Normal rate.     Pulses:          Dorsalis pedis pulses are 1+ on the right side and 1+ on the left side.       Posterior tibial pulses are 1+ on the right side and 1+ on the left side.  Pulmonary:     Effort: Pulmonary effort is normal.  Musculoskeletal:     Right lower leg: Edema present.     Left lower leg: Edema present.  Skin:    General: Skin is warm and dry.     Comments: Hyperpigmentation  Neurological:     Mental Status: She is alert and oriented to person, place, and time.  Psychiatric:        Mood and Affect: Mood normal.        Behavior: Behavior normal.         Thought Content: Thought content normal.        Judgment: Judgment normal.     BP (!) 118/59   Pulse 70   Resp 16   Past Medical History:  Diagnosis Date   Anemia    Arrhythmia    atrial fibrillation   CHF (congestive heart failure) (HCC)    Chronic kidney disease    Depression    HBP (high blood pressure)    History of bladder problems    Memory loss    Muscle pain    Osteoporosis    Reflux    Sinus congestion    Swelling    B/L FEET AND LEGS    Social History   Socioeconomic History   Marital status: Married    Spouse name: Not on file   Number of children: Not on file   Years of education: Not on file   Highest education level: Not on file  Occupational History   Not on file  Tobacco Use   Smoking status: Former    Current packs/day: 0.00    Types: Cigarettes  Quit date: 01/12/1993    Years since quitting: 30.4   Smokeless tobacco: Former    Types: Snuff, Chew   Tobacco comments:    DATE QUIT UNKNOWN  Vaping Use   Vaping status: Never Used  Substance and Sexual Activity   Alcohol use: No   Drug use: No   Sexual activity: Not Currently  Other Topics Concern   Not on file  Social History Narrative   Not on file   Social Drivers of Health   Financial Resource Strain: Low Risk  (03/07/2021)   Received from [facility], [facility]   Overall Financial Resource Strain (CARDIA)    Difficulty of Paying Living Expenses: Not hard at all  Food Insecurity: No Food Insecurity (01/08/2023)   Hunger Vital Sign    Worried About Running Out of Food in the Last Year: Never true    Ran Out of Food in the Last Year: Never true  Transportation Needs: No Transportation Needs (01/08/2023)   PRAPARE - Administrator, Civil Service (Medical): No    Lack of Transportation (Non-Medical): No  Physical Activity: Not on file  Stress: Not on file  Social Connections: Not on file  Intimate Partner Violence: Not At Risk (12/24/2022)    Humiliation, Afraid, Rape, and Kick questionnaire    Fear of Current or Ex-Partner: No    Emotionally Abused: No    Physically Abused: No    Sexually Abused: No    Past Surgical History:  Procedure Laterality Date   ARTERY BIOPSY Bilateral 12/01/2016   Procedure: BIOPSY TEMPORAL ARTERY;  Surgeon: Fransisco Hertz, MD;  Location: [facility] OR;  Service: Vascular;  Laterality: Bilateral;   CATARACT SURGERY     COLONOSCOPY WITH PROPOFOL N/A 12/13/2021   Procedure: COLONOSCOPY WITH PROPOFOL;  Surgeon: Wyline Mood, MD;  Location: [facility] ENDOSCOPY;  Service: Gastroenterology;  Laterality: N/A;   CORONARY/GRAFT ACUTE MI REVASCULARIZATION N/A 08/05/2022   Procedure: Coronary/Graft Acute MI Revascularization;  Surgeon: Marcina Millard, MD;  Location: [facility] INVASIVE CV LAB;  Service: Cardiovascular;  Laterality: N/A;   ESOPHAGOGASTRODUODENOSCOPY (EGD) WITH PROPOFOL N/A 12/12/2021   Procedure: ESOPHAGOGASTRODUODENOSCOPY (EGD) WITH PROPOFOL;  Surgeon: Jaynie Collins, DO;  Location: [facility] ENDOSCOPY;  Service: Gastroenterology;  Laterality: N/A;   EYELID SURGERY Bilateral    GIVENS CAPSULE STUDY N/A 12/15/2021   Procedure: GIVENS CAPSULE STUDY;  Surgeon: Jaynie Collins, DO;  Location[facility] ENDOSCOPY;  Service: Gastroenterology;  Laterality: N/A;   GIVENS CAPSULE STUDY N/A 01/28/2022   Procedure: GIVENS CAPSULE STUDY;  Surgeon: Toney Reil, MD;  Location: [facility] ENDOSCOPY;  Service: Gastroenterology;  Laterality: N/A;   GROWTH ON FACE     KNEE SURGERY Right    LEFT HEART CATH AND CORONARY ANGIOGRAPHY N/A 08/05/2022   Procedure: LEFT HEART CATH AND CORONARY ANGIOGRAPHY;  Surgeon: Marcina Millard, MD;  Location: [facility] INVASIVE CV LAB;  Service: Cardiovascular;  Laterality: N/A;   THROMBECTOMY FEMORAL ARTERY Left 11/16/2021   Procedure: THROMBECTOMY FEMORAL ARTERY;  Surgeon: Fransisco Hertz, MD;  Location: [facility] ORS;  Service: Vascular;  Laterality: Left;    Family History  Problem Relation Age of  Onset   Heart disease Mother    AAA (abdominal aortic aneurysm) Mother    Heart disease Father     Allergies  Allergen Reactions   Chlorhexidine Rash   Morphine Hives and Itching    Other Reaction(s): Confusion   Other Hives, Itching and Other (See Comments)    Other Reaction(s): Confusion  Morphine And Codeine Itching   Kenalog [Triamcinolone Acetonide] Other (See Comments)    FLUSH IN FACE   Penicillins Other (See Comments)    Has patient had a PCN reaction causing immediate rash, facial/tongue/throat swelling, SOB or lightheadedness with hypotension: Unknown Has patient had a PCN reaction causing severe rash involving mucus membranes or skin necrosis: Unknown Has patient had a PCN reaction that required hospitalization:No Has patient had a PCN reaction occurring within the last 10 years: No If all of the above answers are "NO", then may proceed with Cephalosporin use.    Latex Rash   Povidone-Iodine Rash    burning       Latest Ref Rng & Units 04/08/2023    1:06 PM 02/16/2023    1:54 PM 12/31/2022   12:54 PM  CBC  WBC 4.0 - 10.5 K/uL 5.3   9.0   Hemoglobin 12.0 - 15.0 g/dL 40.9  81.1  9.8   Hematocrit 36.0 - 46.0 % 34.4  32.8  30.4   Platelets 150 - 400 K/uL 163   253       CMP     Component Value Date/Time   NA 135 12/28/2022 0411   K 3.9 12/28/2022 0411   K 3.8 06/06/2012 1632   CL 104 12/28/2022 0411   CO2 24 12/28/2022 0411   GLUCOSE 95 12/28/2022 0411   BUN 21 12/28/2022 0411   CREATININE 1.41 (H) 12/28/2022 0411   CALCIUM 8.3 (L) 12/28/2022 0411   PROT 7.6 12/24/2022 0917   ALBUMIN 3.3 (L) 12/24/2022 0917   AST 19 12/24/2022 0917   ALT 10 12/24/2022 0917   ALKPHOS 61 12/24/2022 0917   BILITOT 1.4 (H) 12/24/2022 0917   GFRNONAA 37 (L) 12/28/2022 0411   GFRAA 37 (L) 01/23/2019 1100     No results found.     Assessment & Plan:   1. Femoral artery occlusion, left (HCC)  Recommend:  The patient has evidence of atherosclerosis of the lower  extremities with claudication.  The patient does not voice lifestyle limiting changes at this point in time.  Noninvasive studies do not suggest clinically significant change.  No invasive studies, angiography or surgery at this time The patient should continue walking and begin a more formal exercise program.  The patient should continue antiplatelet therapy and aggressive treatment of the lipid abnormalities  No changes in the patient's medications at this time  Continued surveillance is indicated as atherosclerosis is likely to progress with time.    The patient will continue follow up with noninvasive studies as ordered.   Patient will follow-up with [place] in 1 year    2. Pain of left hip Per the patient she is not able to be a surgical candidate.  She is undergo numerous shots but none of these have been helpful.  Will refer the patient to pain management for possible further options for treatment.  3. Hyperlipidemia, unspecified hyperlipidemia type Continue statin as ordered and reviewed, no changes at this time    Current Outpatient Medications on File Prior to Visit  Medication Sig Dispense Refill   acetaminophen (TYLENOL) 325 MG tablet Take 2 tablets (650 mg total) by mouth every 6 (six) hours as needed for mild pain or fever.     amitriptyline (ELAVIL) 25 MG tablet Take 25 mg by mouth at bedtime as needed.     cholecalciferol (VITAMIN D3) 25 MCG (1000 UT) tablet Take 1,000 Units by mouth daily.     clopidogrel (PLAVIX) 75 MG  tablet TAKE 1 TABLET BY MOUTH EVERY DAY 90 tablet 2   cyanocobalamin (VITAMIN B12) 1000 MCG tablet Take 1,000 mcg by mouth daily.     Ferrous Sulfate (IRON) 325 (65 Fe) MG TABS Take 1 tablet by mouth daily.     lidocaine (LIDODERM) 5 % Place 1 patch onto the skin daily. Remove & Discard patch within 12 hours or as directed by MD 30 patch 0   loratadine (CLARITIN) 10 MG tablet Take 10 mg by mouth daily as needed.     nystatin (MYCOSTATIN) 100000 UNIT/ML  suspension Take by mouth.     pantoprazole (PROTONIX) 40 MG tablet Take 40 mg by mouth at bedtime.     potassium chloride (MICRO-K) 10 MEQ CR capsule Take by mouth.     pregabalin (LYRICA) 75 MG capsule Take 1 capsule (75 mg total) by mouth daily. 30 capsule 0   rosuvastatin (CRESTOR) 5 MG tablet Take by mouth.     sertraline (ZOLOFT) 50 MG tablet Take 50 mg by mouth daily.  0   torsemide (DEMADEX) 100 MG tablet Take by mouth.     empagliflozin (JARDIANCE) 10 MG TABS tablet Take 1 tablet by mouth daily. (Patient not taking: Reported on 04/13/2023)     No current facility-administered medications on file prior to visit.    There are no Patient Instructions on file for this visit. No follow-ups on file.   Georgiana Spinner, NP

## 2023-06-09 LAB — VAS US ABI WITH/WO TBI
Left ABI: 1.24
Right ABI: 1.24

## 2023-06-16 DIAGNOSIS — I214 Non-ST elevation (NSTEMI) myocardial infarction: Secondary | ICD-10-CM | POA: Diagnosis not present

## 2023-07-06 DIAGNOSIS — R001 Bradycardia, unspecified: Secondary | ICD-10-CM | POA: Diagnosis not present

## 2023-07-06 DIAGNOSIS — I2510 Atherosclerotic heart disease of native coronary artery without angina pectoris: Secondary | ICD-10-CM | POA: Diagnosis not present

## 2023-07-06 DIAGNOSIS — E785 Hyperlipidemia, unspecified: Secondary | ICD-10-CM | POA: Diagnosis not present

## 2023-07-06 DIAGNOSIS — I4892 Unspecified atrial flutter: Secondary | ICD-10-CM | POA: Diagnosis not present

## 2023-07-06 DIAGNOSIS — Z950 Presence of cardiac pacemaker: Secondary | ICD-10-CM | POA: Diagnosis not present

## 2023-07-06 DIAGNOSIS — R0602 Shortness of breath: Secondary | ICD-10-CM | POA: Diagnosis not present

## 2023-07-06 DIAGNOSIS — Z952 Presence of prosthetic heart valve: Secondary | ICD-10-CM | POA: Diagnosis not present

## 2023-07-06 DIAGNOSIS — I10 Essential (primary) hypertension: Secondary | ICD-10-CM | POA: Diagnosis not present

## 2023-07-06 DIAGNOSIS — I214 Non-ST elevation (NSTEMI) myocardial infarction: Secondary | ICD-10-CM | POA: Diagnosis not present

## 2023-07-06 DIAGNOSIS — I4891 Unspecified atrial fibrillation: Secondary | ICD-10-CM | POA: Diagnosis not present

## 2023-07-06 DIAGNOSIS — I453 Trifascicular block: Secondary | ICD-10-CM | POA: Diagnosis not present

## 2023-07-06 DIAGNOSIS — I5033 Acute on chronic diastolic (congestive) heart failure: Secondary | ICD-10-CM | POA: Diagnosis not present

## 2023-07-08 ENCOUNTER — Inpatient Hospital Stay: Payer: Medicare Other | Attending: Oncology

## 2023-07-08 DIAGNOSIS — R5383 Other fatigue: Secondary | ICD-10-CM | POA: Insufficient documentation

## 2023-07-08 DIAGNOSIS — N184 Chronic kidney disease, stage 4 (severe): Secondary | ICD-10-CM | POA: Diagnosis not present

## 2023-07-08 DIAGNOSIS — Z87891 Personal history of nicotine dependence: Secondary | ICD-10-CM | POA: Insufficient documentation

## 2023-07-08 DIAGNOSIS — D631 Anemia in chronic kidney disease: Secondary | ICD-10-CM | POA: Insufficient documentation

## 2023-07-08 LAB — CBC WITH DIFFERENTIAL (CANCER CENTER ONLY)
Abs Immature Granulocytes: 0.02 10*3/uL (ref 0.00–0.07)
Basophils Absolute: 0 10*3/uL (ref 0.0–0.1)
Basophils Relative: 1 %
Eosinophils Absolute: 0.2 10*3/uL (ref 0.0–0.5)
Eosinophils Relative: 3 %
HCT: 35 % — ABNORMAL LOW (ref 36.0–46.0)
Hemoglobin: 11.2 g/dL — ABNORMAL LOW (ref 12.0–15.0)
Immature Granulocytes: 0 %
Lymphocytes Relative: 30 %
Lymphs Abs: 1.8 10*3/uL (ref 0.7–4.0)
MCH: 30.4 pg (ref 26.0–34.0)
MCHC: 32 g/dL (ref 30.0–36.0)
MCV: 94.9 fL (ref 80.0–100.0)
Monocytes Absolute: 0.5 10*3/uL (ref 0.1–1.0)
Monocytes Relative: 8 %
Neutro Abs: 3.4 10*3/uL (ref 1.7–7.7)
Neutrophils Relative %: 58 %
Platelet Count: 161 10*3/uL (ref 150–400)
RBC: 3.69 MIL/uL — ABNORMAL LOW (ref 3.87–5.11)
RDW: 13.6 % (ref 11.5–15.5)
WBC Count: 5.9 10*3/uL (ref 4.0–10.5)
nRBC: 0 % (ref 0.0–0.2)

## 2023-07-08 LAB — RETIC PANEL
Immature Retic Fract: 6.1 % (ref 2.3–15.9)
RBC.: 3.71 MIL/uL — ABNORMAL LOW (ref 3.87–5.11)
Retic Count, Absolute: 54.2 10*3/uL (ref 19.0–186.0)
Retic Ct Pct: 1.5 % (ref 0.4–3.1)
Reticulocyte Hemoglobin: 32.7 pg (ref >27.9)

## 2023-07-08 LAB — IRON AND TIBC
Iron: 83 ug/dL (ref 28–170)
Saturation Ratios: 31 % (ref 10.4–31.8)
TIBC: 270 ug/dL (ref 250–450)
UIBC: 187 ug/dL

## 2023-07-08 LAB — FERRITIN: Ferritin: 270 ng/mL (ref 11–307)

## 2023-07-13 ENCOUNTER — Inpatient Hospital Stay (HOSPITAL_BASED_OUTPATIENT_CLINIC_OR_DEPARTMENT_OTHER): Payer: Medicare Other | Admitting: Oncology

## 2023-07-13 ENCOUNTER — Encounter: Payer: Self-pay | Admitting: Oncology

## 2023-07-13 ENCOUNTER — Inpatient Hospital Stay: Payer: Medicare Other

## 2023-07-13 VITALS — BP 106/65 | HR 69 | Temp 98.0°F | Resp 17 | Wt 202.0 lb

## 2023-07-13 DIAGNOSIS — Z87891 Personal history of nicotine dependence: Secondary | ICD-10-CM | POA: Diagnosis not present

## 2023-07-13 DIAGNOSIS — D631 Anemia in chronic kidney disease: Secondary | ICD-10-CM

## 2023-07-13 DIAGNOSIS — N184 Chronic kidney disease, stage 4 (severe): Secondary | ICD-10-CM

## 2023-07-13 DIAGNOSIS — R5383 Other fatigue: Secondary | ICD-10-CM | POA: Diagnosis not present

## 2023-07-13 NOTE — Assessment & Plan Note (Addendum)
 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 Previous Bone marrow biopsy results showed hypercellular marrow, no significant dysplastic features.  Cytogenetic normal. Labs are reviewed and discussed with patient. Lab Results  Component Value Date   HGB 11.2 (L) 07/08/2023   TIBC 270 07/08/2023   IRONPCTSAT 31 07/08/2023   FERRITIN 270 07/08/2023    Ferritin is at goal.  Hemoglobin > 10.  No need for  Retacrit  Continue oral iron  supplementation.

## 2023-07-13 NOTE — Progress Notes (Signed)
 Hematology/Oncology Progress note [phone] [phone]      CHIEF COMPLAINTS/REASON FOR VISIT:  Anemia due to CKD  ASSESSMENT & PLAN:   Anemia of chronic kidney failure, stage 4 (severe) (HCC) Likely anemia secondary to chronic kidney disease. Previous Bone marrow biopsy results showed hypercellular marrow, no significant dysplastic features.  Cytogenetic normal. Labs are reviewed and discussed with patient. Lab Results  Component Value Date   HGB 11.2 (L) 07/08/2023   TIBC 270 07/08/2023   IRONPCTSAT 31 07/08/2023   FERRITIN 270 07/08/2023    Ferritin is at goal.  Hemoglobin > 10.  No need for  Retacrit  Continue oral iron  supplementation.    Orders Placed This Encounter  Procedures   CBC with Differential (Cancer Center Only)    Standing Status:   Future    Expected Date:   01/13/2024    Expiration Date:   07/12/2024   Iron  and TIBC    Standing Status:   Future    Expected Date:   01/13/2024    Expiration Date:   07/12/2024   Ferritin    Standing Status:   Future    Expected Date:   01/13/2024    Expiration Date:   07/12/2024   Retic Panel    Standing Status:   Future    Expected Date:   01/13/2024    Expiration Date:   07/12/2024   Follow-up in 6 months  All questions were answered. The patient knows to call the clinic with any problems, questions or concerns.  Timmy Forbes, MD, PhD [facility] Hematology Oncology 07/13/2023    HISTORY OF PRESENTING ILLNESS:  Reviewed patient's recent labs that was done.  10/17/2018 labs revealed anemia with hemoglobin of 12.4 Free kappa light chain 5.35, lambda light chain 2.23, kappa lambda ratio 2.31. SPEP showed faint monoclonal component typed as IgG kappa.  Faint band suspicious for monoclonal components No aggravating or improving factors.  Associated signs and symptoms: Chronic fatigue.  No exacerbating or alleviating factors. . Patient has chronic kidney disease stage III follow-up with [facility] nephrology Dr.  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  Has been on chronic steroids.She reports her current prednisone  regimen is 5 mg alternating with 2.5 mg. Chronic joint, neck, back pains.  hospitalized from 02/18/2022 - 02/20/2022 due to anemia.  Patient had extensive GI workup including EGD, colonoscopy, capsule study which were negative.  Patient was seen by Dr. Valentine Gasmen during hospitalization.  Patient presented with a hemoglobin of 6.5, status post PRBC transfusion and 1 dose of Venofer  300 mg daily.   02/20/22 Bone marrow biopsy showed hypercellular marrow, no significant dysplastic.  Cytogenetic normal.  08/12/2022 Patient has aortic stenosis, status post TAVR and pacemaker placement   INTERVAL HISTORY Rebecca Gilmore is a 83 y.o. female who has above history reviewed by me today presents for follow up of anemia due to CKD No new complaints. She takes oral iron  supplementation.    Review of Systems  Constitutional:  Positive for fatigue. Negative for appetite change, chills and fever.  HENT:   Negative for hearing loss and voice change.   Eyes:  Negative for eye problems.  Respiratory:  Negative for chest tightness and cough.   Cardiovascular:  Negative for chest pain.  Gastrointestinal:  Negative for abdominal distention, abdominal pain and blood in stool.  Endocrine: Negative for hot flashes.  Genitourinary:  Negative for difficulty urinating and frequency.   Musculoskeletal:  Positive for arthralgias, back pain and neck pain.  Skin:  Negative for itching and rash.  Neurological:  Negative for extremity weakness.  Hematological:  Negative for adenopathy.  Psychiatric/Behavioral:  Negative for confusion.     MEDICAL HISTORY:  Past Medical History:  Diagnosis Date   Anemia    Arrhythmia    atrial fibrillation   CHF (congestive heart failure) (HCC)    Chronic kidney disease    Depression    HBP (high blood pressure)    History of bladder problems     Memory loss    Muscle pain    Osteoporosis    Reflux    Sinus congestion    Swelling    B/L FEET AND LEGS    SURGICAL HISTORY: Past Surgical History:  Procedure Laterality Date   ARTERY BIOPSY Bilateral 12/01/2016   Procedure: BIOPSY TEMPORAL ARTERY;  Surgeon: Arvil Lauber, MD;  Location: [facility] OR;  Service: Vascular;  Laterality: Bilateral;   CATARACT SURGERY     COLONOSCOPY WITH PROPOFOL  N/A 12/13/2021   Procedure: COLONOSCOPY WITH PROPOFOL ;  Surgeon: Luke Salaam, MD;  Location: [facility] ENDOSCOPY;  Service: Gastroenterology;  Laterality: N/A;   CORONARY/GRAFT ACUTE MI REVASCULARIZATION N/A 08/05/2022   Procedure: Coronary/Graft Acute MI Revascularization;  Surgeon: Percival Brace, MD;  Location: [facility] INVASIVE CV LAB;  Service: Cardiovascular;  Laterality: N/A;   ESOPHAGOGASTRODUODENOSCOPY (EGD) WITH PROPOFOL  N/A 12/12/2021   Procedure: ESOPHAGOGASTRODUODENOSCOPY (EGD) WITH PROPOFOL ;  Surgeon: Quintin Buckle, DO;  Location: [facility] ENDOSCOPY;  Service: Gastroenterology;  Laterality: N/A;   EYELID SURGERY Bilateral    GIVENS CAPSULE STUDY N/A 12/15/2021   Procedure: GIVENS CAPSULE STUDY;  Surgeon: Quintin Buckle, DO;  Location: [facility] ENDOSCOPY;  Service: Gastroenterology;  Laterality: N/A;   GIVENS CAPSULE STUDY N/A 01/28/2022   Procedure: GIVENS CAPSULE STUDY;  Surgeon: Selena Daily, MD;  Location: [facility] ENDOSCOPY;  Service: Gastroenterology;  Laterality: N/A;   GROWTH ON FACE     KNEE SURGERY Right    LEFT HEART CATH AND CORONARY ANGIOGRAPHY N/A 08/05/2022   Procedure: LEFT HEART CATH AND CORONARY ANGIOGRAPHY;  Surgeon: Percival Brace, MD;  Location: [facility] INVASIVE CV LAB;  Service: Cardiovascular;  Laterality: N/A;   THROMBECTOMY FEMORAL ARTERY Left 11/16/2021   Procedure: THROMBECTOMY FEMORAL ARTERY;  Surgeon: Arvil Lauber, MD;  Location: [facility] ORS;  Service: Vascular;  Laterality: Left;    SOCIAL HISTORY: Social History   Socioeconomic History   Marital  status: Married    Spouse name: Not on file   Number of children: Not on file   Years of education: Not on file   Highest education level: Not on file  Occupational History   Not on file  Tobacco Use   Smoking status: Former    Current packs/day: 0.00    Types: Cigarettes    Quit date: 01/12/1993    Years since quitting: 30.5   Smokeless tobacco: Former    Types: Snuff, Chew   Tobacco comments:    DATE QUIT UNKNOWN  Vaping Use   Vaping status: Never Used  Substance and Sexual Activity   Alcohol use: No   Drug use: No   Sexual activity: Not Currently  Other Topics Concern   Not on file  Social History Narrative   Not on file   Social Drivers of Health   Financial Resource Strain: Low Risk  (03/07/2021)   Received from [facility]   Overall Financial Resource Strain (CARDIA)    Difficulty of Paying Living Expenses: Not hard at all  Food  Insecurity: No Food Insecurity (01/08/2023)   Hunger Vital Sign    Worried About Running Out of Food in the Last Year: Never true    Ran Out of Food in the Last Year: Never true  Transportation Needs: No Transportation Needs (01/08/2023)   PRAPARE - Administrator, Civil Service (Medical): No    Lack of Transportation (Non-Medical): No  Physical Activity: Not on file  Stress: Not on file  Social Connections: Not on file  Intimate Partner Violence: Not At Risk (12/24/2022)   Humiliation, Afraid, Rape, and Kick questionnaire    Fear of Current or Ex-Partner: No    Emotionally Abused: No    Physically Abused: No    Sexually Abused: No    FAMILY HISTORY: Family History  Problem Relation Age of Onset   Heart disease Mother    AAA (abdominal aortic aneurysm) Mother    Heart disease Father     ALLERGIES:  is allergic to chlorhexidine , morphine , other, morphine  and codeine, kenalog  [triamcinolone  acetonide], penicillins, latex, and povidone-iodine.  MEDICATIONS:  Current Outpatient Medications   Medication Sig Dispense Refill   acetaminophen  (TYLENOL ) 325 MG tablet Take 2 tablets (650 mg total) by mouth every 6 (six) hours as needed for mild pain or fever.     amitriptyline  (ELAVIL ) 25 MG tablet Take 25 mg by mouth at bedtime as needed.     cholecalciferol  (VITAMIN D3) 25 MCG (1000 UT) tablet Take 1,000 Units by mouth daily.     clopidogrel  (PLAVIX ) 75 MG tablet TAKE 1 TABLET BY MOUTH EVERY DAY 90 tablet 2   cyanocobalamin  (VITAMIN B12) 1000 MCG tablet Take 1,000 mcg by mouth daily.     Ferrous Sulfate  (IRON ) 325 (65 Fe) MG TABS Take 1 tablet by mouth daily.     lidocaine  (LIDODERM ) 5 % Place 1 patch onto the skin daily. Remove & Discard patch within 12 hours or as directed by MD 30 patch 0   loratadine  (CLARITIN ) 10 MG tablet Take 10 mg by mouth daily as needed.     nystatin (MYCOSTATIN) 100000 UNIT/ML suspension Take by mouth.     pantoprazole  (PROTONIX ) 40 MG tablet Take 40 mg by mouth at bedtime.     potassium chloride  (MICRO-K ) 10 MEQ CR capsule Take by mouth.     pregabalin  (LYRICA ) 75 MG capsule Take 1 capsule (75 mg total) by mouth daily. 30 capsule 0   rosuvastatin  (CRESTOR ) 5 MG tablet Take by mouth.     sertraline  (ZOLOFT ) 50 MG tablet Take 50 mg by mouth daily.  0   torsemide  (DEMADEX ) 100 MG tablet Take by mouth.     empagliflozin (JARDIANCE) 10 MG TABS tablet Take 1 tablet by mouth daily. (Patient not taking: Reported on 04/13/2023)     No current facility-administered medications for this visit.     PHYSICAL EXAMINATION: ECOG PERFORMANCE STATUS: 1 - Symptomatic but completely ambulatory Vitals:   07/13/23 1301  BP: 106/65  Pulse: 69  Resp: 17  Temp: 98 F (36.7 C)  SpO2: 99%   Filed Weights   07/13/23 1301  Weight: 202 lb (91.6 kg)    Physical Exam Constitutional:      General: She is not in acute distress.    Appearance: She is obese.  HENT:     Head: Normocephalic and atraumatic.  Eyes:     General: No scleral icterus.    Pupils: Pupils are  equal, round, and reactive to light.  Cardiovascular:     Rate  and Rhythm: Normal rate.     Heart sounds: Murmur heard.  Pulmonary:     Effort: Pulmonary effort is normal. No respiratory distress.  Abdominal:     General: Bowel sounds are normal. There is no distension.     Palpations: Abdomen is soft.  Musculoskeletal:        General: No deformity. Normal range of motion.     Cervical back: Normal range of motion.  Skin:    General: Skin is warm and dry.     Findings: No erythema or rash.  Neurological:     Mental Status: She is alert and oriented to person, place, and time. Mental status is at baseline.  Psychiatric:        Thought Content: Thought content normal.      LABORATORY DATA:  I have reviewed the data as listed     Latest Ref Rng & Units 07/08/2023    1:34 PM 04/08/2023    1:06 PM 02/16/2023    1:54 PM  CBC  WBC 4.0 - 10.5 K/uL 5.9  5.3    Hemoglobin 12.0 - 15.0 g/dL 95.6  21.3  08.6   Hematocrit 36.0 - 46.0 % 35.0  34.4  32.8   Platelets 150 - 400 K/uL 161  163        Latest Ref Rng & Units 12/28/2022    4:11 AM 12/27/2022    4:15 AM 12/26/2022    8:16 AM  CMP  Glucose 70 - 99 mg/dL 95  90  87   BUN 8 - 23 mg/dL 21  27  32   Creatinine 0.44 - 1.00 mg/dL 5.78  4.69  6.29   Sodium 135 - 145 mmol/L 135  134  134   Potassium 3.5 - 5.1 mmol/L 3.9  3.9  3.7   Chloride 98 - 111 mmol/L 104  103  104   CO2 22 - 32 mmol/L 24  24  21    Calcium  8.9 - 10.3 mg/dL 8.3  7.9  8.0     Iron /TIBC/Ferritin/ %Sat    Component Value Date/Time   IRON  83 07/08/2023 1334   TIBC 270 07/08/2023 1334   FERRITIN 270 07/08/2023 1334   IRONPCTSAT 31 07/08/2023 1334

## 2023-07-16 DIAGNOSIS — I214 Non-ST elevation (NSTEMI) myocardial infarction: Secondary | ICD-10-CM | POA: Diagnosis not present

## 2023-07-27 ENCOUNTER — Encounter (INDEPENDENT_AMBULATORY_CARE_PROVIDER_SITE_OTHER): Payer: Self-pay

## 2023-07-27 ENCOUNTER — Encounter: Payer: Self-pay | Admitting: Student in an Organized Health Care Education/Training Program

## 2023-07-27 ENCOUNTER — Ambulatory Visit
Attending: Student in an Organized Health Care Education/Training Program | Admitting: Student in an Organized Health Care Education/Training Program

## 2023-07-27 VITALS — BP 118/50 | HR 70 | Temp 97.9°F | Resp 16 | Ht 63.0 in | Wt 197.0 lb

## 2023-07-27 DIAGNOSIS — G5712 Meralgia paresthetica, left lower limb: Secondary | ICD-10-CM | POA: Diagnosis not present

## 2023-07-27 DIAGNOSIS — M79605 Pain in left leg: Secondary | ICD-10-CM | POA: Insufficient documentation

## 2023-07-27 DIAGNOSIS — G5793 Unspecified mononeuropathy of bilateral lower limbs: Secondary | ICD-10-CM | POA: Insufficient documentation

## 2023-07-27 MED ORDER — BUPRENORPHINE 5 MCG/HR TD PTWK: 1.0000 | MEDICATED_PATCH | TRANSDERMAL | 1 refills | Status: DC

## 2023-07-27 NOTE — Progress Notes (Signed)
 Safety precautions to be maintained throughout the outpatient stay will include: orient to surroundings, keep bed in low position, maintain call bell within reach at all times, provide assistance with transfer out of bed and ambulation.

## 2023-07-27 NOTE — Patient Instructions (Signed)

## 2023-07-27 NOTE — Progress Notes (Signed)
 PROVIDER NOTE: Interpretation of information contained herein should be left to medically-trained personnel. Specific patient instructions are provided elsewhere under "Patient Instructions" section of medical record. This document was created in part using AI and STT-dictation technology, any transcriptional errors that may result from this process are unintentional.  Patient: Rebecca Gilmore  Service: E/M Encounter  Provider: Cephus Collin, MD  DOB: 1940/05/11  Delivery: Face-to-face  Specialty: Interventional Pain Management  MRN: [id number]  Setting: Ambulatory outpatient facility  Specialty designation: 09  Type: New Patient  Location: Outpatient office facility  PCP: Annette Barters, MD  DOS: 07/27/2023    Referring Prov.: Valene Gash, NP   Primary Reason(s) for Visit: Encounter for initial evaluation of one or more chronic problems (new to examiner) potentially causing chronic pain, and posing a threat to normal musculoskeletal function. (Level of risk: High) CC: Hip Pain (L>R) and Leg Pain (Left)  HPI  Rebecca Gilmore is a 83 y.o. year old, female patient, who comes for the first time to our practice referred by Brown, Fallon E, NP for our initial evaluation of her chronic pain. She has Plantar fascial fibromatosis; Femoral artery occlusion, left (HCC); GI bleeding; Depression; HLD (hyperlipidemia); Iron  deficiency anemia; Chronic diastolic CHF (congestive heart failure) (HCC); Myocardial injury; Atrial fibrillation, chronic (HCC); Temporal arteritis (HCC); Acute blood loss anemia; Anemia; Acute heart failure (HCC); Acute respiratory failure with hypoxia (HCC); Bradycardia; CKD (chronic kidney disease) stage 4, GFR 15-29 ml/min (HCC); Influenza A; Severe aortic stenosis; NSTEMI (non-ST elevated myocardial infarction) (HCC); Peripheral edema; SOB (shortness of breath) on exertion; Atrial fibrillation and flutter (HCC); Chronic kidney disease, stage 3b (HCC); Hypokalemia; Acute on chronic blood loss  anemia; Symptomatic anemia; Morbid obesity with BMI of 40.0-44.9, adult (HCC); Acute on chronic diastolic (congestive) heart failure (HCC); Anemia of chronic kidney failure, stage 4 (severe) (HCC); Anemia, unspecified; History of recurrent GI bleed; Anemia of chronic renal failure, stage 3b (HCC); Coronary artery disease; Elevated troponin; Hypotension; Acute on chronic diastolic CHF (congestive heart failure) (HCC); Acute ST elevation myocardial infarction (STEMI) involving left anterior descending (LAD) coronary artery (HCC); Severe sepsis with acute organ dysfunction (HCC); CKD stage 3b, GFR 30-44 ml/min (HCC); AKI (acute kidney injury) (HCC); Leukocytosis; Left leg pain; Morbid obesity (HCC); E. coli septicemia (HCC); Metabolic acidosis; Meralgia paraesthetica, left; and Neuropathic pain, leg, bilateral on their problem list. Today she comes in for evaluation of her Hip Pain (L>R) and Leg Pain (Left)  Pain Assessment: Location: Lower, Left Hip Radiating: Radaites from left hip down left leg oyter to left ankle Onset: More than a month ago Duration: Chronic pain Quality: Constant, Nagging, Sharp, Shooting, Stabbing (Stiff) Severity: 8 /10 (subjective, self-reported pain score)  Effect on ADL: Limtis ADLs Timing: Constant Modifying factors: Denies BP: (!) 118/50  HR: 70  Onset and Duration: Gradual, Date of onset: 11/2021, and Present longer than 3 months Cause of pain: Surgery Severity: Getting worse, NAS-11 at its worse: 10/10, NAS-11 at its best: 9/10, NAS-11 now: 5/10, and NAS-11 on the average: 7/10 Timing: Not influenced by the time of the day, During activity or exercise, and After activity or exercise Aggravating Factors: Bending, Motion, Nerve blocks, Prolonged standing, Walking, and Working Alleviating Factors: Denies Associated Problems: Numbness, Tingling, and Pain that does not allow patient to sleep Quality of Pain: Agonizing, Annoying, Constant, Disabling, Distressing,  Exhausting, Nagging, Sharp, Shooting, Stabbing, and Uncomfortable Previous Examinations or Tests: Nerve block, X-rays, and Neurosurgical evaluation Previous Treatments: Epidural steroid injections  Rebecca Gilmore is being  evaluated for possible interventional pain management therapies for the treatment of her chronic pain.   Discussed the use of AI scribe software for clinical note transcription with the patient, who gave verbal consent to proceed.  History of Present Illness   Rebecca Gilmore is an 83 year old female with a history of stent placement who presents with severe left hip pain radiating to the ankle. She is accompanied by her daughter, Rebecca Gilmore. She was referred by the vascular team for evaluation of her left hip pain.  She experiences severe pain in her left hip joint that radiates down the side of her leg to her ankle, extending to the front of her leg where she experiences numbness. The pain has been persistent since a stent was placed, and she has been unable to walk since then. The pain worsens with leg rotation, turning, or making a wrong move, and is exacerbated when lifting her leg to get in and out of places. She describes the pain as constant, though its intensity varies, and states that nothing relieves it.  She has a history of receiving an epidural injection for back pain, which was not helpful. She experiences burning and tingling in her feet and hands and has been diagnosed with neuropathy, though she is not diabetic. No history of shingles.  Current medications include Lyrica  (pregabalin ) at 75 mg, and she is on clopidogrel  due to her stent placement. She also has a history of atrial fibrillation and has a pacemaker. She avoids ibuprofen due to kidney issues and uses Tylenol  for pain management. She has previously used tramadol but is not currently taking it.      Meds   Current Outpatient Medications:    acetaminophen  (TYLENOL ) 325 MG tablet, Take 2 tablets (650 mg  total) by mouth every 6 (six) hours as needed for mild pain or fever., Disp: , Rfl:    amitriptyline  (ELAVIL ) 25 MG tablet, Take 25 mg by mouth at bedtime as needed., Disp: , Rfl:    buprenorphine (BUTRANS) 5 MCG/HR PTWK, Place 1 patch onto the skin once a week., Disp: 4 patch, Rfl: 1   cholecalciferol  (VITAMIN D3) 25 MCG (1000 UT) tablet, Take 1,000 Units by mouth daily., Disp: , Rfl:    clopidogrel  (PLAVIX ) 75 MG tablet, TAKE 1 TABLET BY MOUTH EVERY DAY, Disp: 90 tablet, Rfl: 2   cyanocobalamin  (VITAMIN B12) 1000 MCG tablet, Take 1,000 mcg by mouth daily., Disp: , Rfl:    empagliflozin (JARDIANCE) 10 MG TABS tablet, Take 1 tablet by mouth daily., Disp: , Rfl:    Ferrous Sulfate  (IRON ) 325 (65 Fe) MG TABS, Take 1 tablet by mouth daily., Disp: , Rfl:    lidocaine  (LIDODERM ) 5 %, Place 1 patch onto the skin daily. Remove & Discard patch within 12 hours or as directed by MD, Disp: 30 patch, Rfl: 0   loratadine  (CLARITIN ) 10 MG tablet, Take 10 mg by mouth daily as needed., Disp: , Rfl:    nystatin (MYCOSTATIN) 100000 UNIT/ML suspension, Take by mouth., Disp: , Rfl:    pantoprazole  (PROTONIX ) 40 MG tablet, Take 40 mg by mouth at bedtime., Disp: , Rfl:    potassium chloride  (MICRO-K ) 10 MEQ CR capsule, Take by mouth., Disp: , Rfl:    pregabalin  (LYRICA ) 75 MG capsule, Take 1 capsule (75 mg total) by mouth daily., Disp: 30 capsule, Rfl: 0   rosuvastatin  (CRESTOR ) 5 MG tablet, Take by mouth., Disp: , Rfl:    sertraline  (ZOLOFT ) 50 MG tablet, Take 50 mg by  mouth daily., Disp: , Rfl: 0   torsemide  (DEMADEX ) 100 MG tablet, Take by mouth., Disp: , Rfl:   Imaging Review    Narrative CLINICAL DATA:  Fell from standing with head and neck trauma.  EXAM: CT HEAD WITHOUT CONTRAST  CT CERVICAL SPINE WITHOUT CONTRAST  TECHNIQUE: Multidetector CT imaging of the head and cervical spine was performed following the standard protocol without intravenous contrast. Multiplanar CT image reconstructions of the  cervical spine were also generated.  RADIATION DOSE REDUCTION: This exam was performed according to the departmental dose-optimization program which includes automated exposure control, adjustment of the mA and/or kV according to patient size and/or use of iterative reconstruction technique.  COMPARISON:  Head CT without contrast 11/19/2021, soft tissue neck CT with contrast and reconstructions 05/29/2008.  FINDINGS: CT HEAD FINDINGS  Brain: There is mild cerebral atrophy, small-vessel disease and atrophic ventriculomegaly with unremarkable cerebellum and brainstem. No hemorrhage, infarct or mass are seen. There is no midline shift. Basal cisterns are clear.  Vascular: Moderate to heavy calcific plaque both siphons. No hyperdense central vessels.  Skull: Negative for fractures or focal lesions. Large posterior left parieto-occipital scalp hematoma.  Sinuses/Orbits: No acute abnormality. Clear sinuses and right mastoids with slight left deviation of nasal septum. Trace chronic fluid left mastoid tip with otherwise clear left mastoids.  Other: None.  CT CERVICAL SPINE FINDINGS  Alignment: Normal with again noted narrowing and spurring of the anterior atlantodental joint.  Skull base and vertebrae: No acute fracture is evident. No primary bone lesion or focal pathologic process.  Soft tissues and spinal canal: No prevertebral fluid or swelling. No visible canal hematoma. Both proximal cervical ICAs are extensively calcified and almost certainly have flow-limiting stenoses. Significant progression in the degree of calcification since 2010.  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 This reduces the effective AP diameter of the thecal sac to 7 mm at most cervical levels.  As a result, relatively mild posterior disc osteophyte complexes  at C5-6 and C6-7, both eccentric to the left, cause moderate spinal canal stenosis with spondylotic cord compression greater on the left.  The discs are degenerated with prominent anterior osteophytes at both C5-6 and C6-7, with mild-to-moderate disc space loss at C7-T1.  The discs are maintained in height at C2-3, C3-4 and C4-5, with no other levels showing significant soft tissue or bony encroachment on the thecal sac.  There are mild facet and uncinate spurring changes, with acquired foraminal stenosis which is severe on the left and moderate on the right at C6-7 and bilaterally mild at C5-6. Other foramina are clear.  Upper chest: There is aortic arch atherosclerosis with heavily calcified and almost certainly severely stenotic if not occluded left subclavian artery origin.  This may predispose to subclavian steal syndrome. There is a dual lead left chest pacing system on the left.  Other: None.  IMPRESSION: 1. Large posterior left parieto-occipital scalp hematoma with no acute intracranial CT findings or depressed skull fractures. 2. Chronic changes with mild atrophy and small-vessel disease. 3. No evidence of cervical fractures or malalignment. 4. Degenerative changes of the cervical spine superimposed on congenitally short pedicles. 5. Moderate spinal canal stenosis with spondylotic cord compression at C5-6 and C6-7, greater on the left. 6. Severely calcified and almost certainly severely stenotic if not occluded left subclavian artery origin. This may predispose to subclavian steal syndrome. 7.  Extensively calcified proximal cervical ICAs, almost certainly with flow-limiting stenoses. 8. Aortic atherosclerosis.  Aortic Atherosclerosis (ICD10-I70.0).   Electronically Signed By: Denman Fischer M.D. On: 10/16/2022 21:38   DG Shoulder Left  Narrative CLINICAL DATA:  Left shoulder pain, recent fall.  EXAM: LEFT SHOULDER - 2+ VIEW  COMPARISON:  None  Available.  FINDINGS: There is no evidence of fracture or dislocation. Mild acromioclavicular and glenohumeral degenerative change. Soft tissues are unremarkable. Left-sided pacemaker is partially obscuring portions of the shoulder.  IMPRESSION: Mild acromioclavicular and glenohumeral degenerative change. No fracture or dislocation.   Electronically Signed By: Chadwick Colonel M.D. On: 12/25/2022 18:00   DG Thoracic Spine 2 View  Narrative CLINICAL DATA:  Fall  EXAM: THORACIC SPINE 2 VIEWS  COMPARISON:  None Available.  FINDINGS: Normal thoracic kyphosis.  No evidence of fracture or dislocation. Vertebral body heights are maintained.  Moderate degenerative changes of the visualized thoracolumbar spine.  Status post TAVR. Left subclavian pacemaker, incompletely visualized. Thoracic aortic atherosclerosis.  IMPRESSION: Negative.   Electronically Signed By: Zadie Herter M.D. On: 10/16/2022 21:22   MR LUMBAR SPINE WO CONTRAST  Narrative CLINICAL DATA:  Left lower back pain radiating down the leg to the foot with leg weakness  EXAM: MRI LUMBAR SPINE WITHOUT CONTRAST  TECHNIQUE: Multiplanar, multisequence MR imaging of the lumbar spine was performed. No intravenous contrast was administered.  COMPARISON:  None Available.  FINDINGS: Segmentation:  Standard.  Alignment:  Physiologic.  Vertebrae: No acute fracture, evidence of discitis, or aggressive bone lesion.  Conus medullaris and cauda equina: Conus extends to the L1 level. Conus and cauda equina appear normal.  Paraspinal and other soft tissues: No acute paraspinal abnormality.  Disc levels:  Disc spaces: Degenerative disease with disc height loss at L1-2, L2-3, L3-4, L4-5. Disc desiccation at T12-L1 and L5-S1. Degenerative disease with disc height loss at T10-11.  T12-L1: Mild broad-based disc bulge. Mild bilateral facet arthropathy. No foraminal or central canal  stenosis.  L1-L2: Broad-based disc bulge with a right subarticular disc protrusion with mass effect on the right intraspinal L2 nerve root. Mild bilateral facet arthropathy. Moderate spinal stenosis. Mild right foraminal stenosis. No left foraminal stenosis.  L2-L3: Mild broad-based disc bulge. Moderate bilateral facet arthropathy. Moderate spinal stenosis. Moderate left foraminal stenosis. No right foraminal stenosis.  L3-L4: Broad-based disc bulge. Moderate bilateral facet arthropathy. Moderate spinal stenosis. Mild bilateral foraminal stenosis.  L4-L5: Broad-based disc bulge. Mild bilateral facet arthropathy. Bilateral lateral recess stenosis. Mild spinal stenosis. Mild bilateral foraminal stenosis.  L5-S1: Broad-based disc bulge eccentric towards the right. Mild bilateral facet arthropathy. No foraminal or central canal stenosis. Bilateral lateral recess stenosis.  IMPRESSION: 1. At L1-2 there is a broad-based disc bulge with a right subarticular disc protrusion with mass effect on the right intraspinal L2 nerve root. Mild bilateral facet arthropathy. Moderate spinal stenosis. Mild right foraminal stenosis. 2. At L2-3 there is a mild broad-based disc bulge. Moderate bilateral facet arthropathy. Moderate spinal stenosis. Moderate left foraminal stenosis. 3. At L3-4 there is a broad-based disc bulge. Moderate bilateral facet arthropathy. Moderate spinal stenosis. Mild bilateral foraminal stenosis. 4. At L4-5 there is a broad-based disc bulge. Mild bilateral facet arthropathy. Bilateral lateral recess stenosis. Mild spinal stenosis. Mild bilateral foraminal stenosis. 5. At L5-S1 there is a broad-based disc bulge eccentric towards the right. Mild bilateral facet arthropathy. No foraminal or central canal stenosis. Bilateral lateral recess stenosis. 6. No acute osseous injury of the lumbar spine.   Electronically Signed By: Drury Geralds  Patel M.D. On: 04/30/2022 13:50   MR  Knee Right Wo Contrast  Narrative CLINICAL DATA:  Diffuse right knee pain for several months. No known injury. Initial encounter.  EXAM: MRI OF THE RIGHT KNEE WITHOUT CONTRAST  TECHNIQUE: Multiplanar, multisequence MR imaging of the knee was performed. No intravenous contrast was administered.  COMPARISON:  None.  FINDINGS: MENISCI  Medial meniscus: There is an oblique tear in the posterior horn medial meniscus reaching the meniscal undersurface. The body of medial meniscus is markedly diminutive and extruded peripherally.  Lateral meniscus: There is a horizontal tear at the junction of anterior horn and body of the lateral meniscus.  LIGAMENTS  Cruciates:  Intact.  Collaterals:  Intact.  CARTILAGE  Patellofemoral:  Unremarkable.  Medial:  Mildly degenerated.  Lateral:  Mildly degenerated.  Joint:  Small joint effusion is noted.  Popliteal Fossa:  No Baker's cyst.  Extensor Mechanism:  Intact.  Bones:  Very small osteophytes are identified about the knee.  IMPRESSION: Oblique tear posterior horn medial meniscus reaches the meniscal undersurface. The body of the medial meniscus is markedly diminutive and extruded peripherally out of the joint.  Horizontal tear at the junction of anterior horn and body of the lateral meniscus.  Mild osteoarthritis.   Electronically Signed By: Etheleen Her M.D. On: 08/26/2014 13:14   Complexity Note: Imaging results reviewed.                         ROS  Cardiovascular: Abnormal heart rhythm, Heart attack ( Date: 09/2022), Heart surgery, Pacemaker or defibrillator, Heart valve problems, and Heart catheterization Pulmonary or Respiratory: No reported pulmonary signs or symptoms such as wheezing and difficulty taking a deep full breath (Asthma), difficulty blowing air out (Emphysema), coughing up mucus (Bronchitis), persistent dry cough, or temporary stoppage of breathing during sleep Neurological: Abnormal skin  sensations (Peripheral Neuropathy) and Incontinence:  Urinary Psychological-Psychiatric: Anxiousness Gastrointestinal: Vomiting blood (Ulcers) and Reflux or heatburn Genitourinary: Kidney disease Hematological: Weakness due to low blood hemoglobin or red blood cell count (Anemia) Endocrine: No reported endocrine signs or symptoms such as high or low blood sugar, rapid heart rate due to high thyroid levels, obesity or weight gain due to slow thyroid or thyroid disease Rheumatologic: Joint aches and or swelling due to excess weight (Osteoarthritis) and Generalized muscle aches (Fibromyalgia) Musculoskeletal: Negative for myasthenia gravis, muscular dystrophy, multiple sclerosis or malignant hyperthermia Work History: Quit going to work on his/her own  Allergies  Rebecca Gilmore is allergic to chlorhexidine , morphine , other, morphine  and codeine, kenalog  [triamcinolone  acetonide], penicillins, latex, and povidone-iodine.  Laboratory Chemistry Profile   Renal Lab Results  Component Value Date   BUN 21 12/28/2022   CREATININE 1.41 (H) 12/28/2022   GFRAA 37 (L) 01/23/2019   GFRNONAA 37 (L) 12/28/2022   PROTEINUR 30 (A) 12/24/2022     Electrolytes Lab Results  Component Value Date   NA 135 12/28/2022   K 3.9 12/28/2022   CL 104 12/28/2022   CALCIUM  8.3 (L) 12/28/2022   MG 1.7 12/28/2022   PHOS 2.9 12/28/2022     Hepatic Lab Results  Component Value Date   AST 19 12/24/2022   ALT 10 12/24/2022   ALBUMIN 3.3 (L) 12/24/2022   ALKPHOS 61 12/24/2022     ID Lab Results  Component Value Date   SARSCOV2NAA NEGATIVE 12/24/2022     Bone No results found for: "VD25OH", "VD125OH2TOT", "MW4132GM0", "NU2725DG6", "25OHVITD1", "25OHVITD2", "25OHVITD3", "TESTOFREE", "TESTOSTERONE"   Endocrine Lab  Results  Component Value Date   GLUCOSE 95 12/28/2022   GLUCOSEU >=500 (A) 12/24/2022   HGBA1C 4.7 (L) 08/05/2022   CRTSLPL 10.6 07/22/2022     Neuropathy Lab Results  Component Value Date    VITAMINB12 910 12/11/2021   FOLATE 9.4 12/11/2021   HGBA1C 4.7 (L) 08/05/2022     CNS No results found for: "COLORCSF", "APPEARCSF", "RBCCOUNTCSF", "WBCCSF", "POLYSCSF", "LYMPHSCSF", "EOSCSF", "PROTEINCSF", "GLUCCSF", "JCVIRUS", "CSFOLI", "IGGCSF", "LABACHR", "ACETBL"   Inflammation (CRP: Acute  ESR: Chronic) Lab Results  Component Value Date   LATICACIDVEN 1.2 12/24/2022     Rheumatology No results found for: "RF", "ANA", "LABURIC", "URICUR", "LYMEIGGIGMAB", "LYMEABIGMQN", "HLAB27"   Coagulation Lab Results  Component Value Date   INR 1.4 (H) 12/24/2022   LABPROT 17.1 (H) 12/24/2022   APTT 35 08/05/2022   PLT 161 07/08/2023     Cardiovascular Lab Results  Component Value Date   BNP 209.3 (H) 07/18/2022   HGB 11.2 (L) 07/08/2023   HCT 35.0 (L) 07/08/2023     Screening Lab Results  Component Value Date   SARSCOV2NAA NEGATIVE 12/24/2022     Cancer No results found for: "CEA", "CA125", "LABCA2"   Allergens No results found for: "ALMOND", "APPLE", "ASPARAGUS", "AVOCADO", "BANANA", "BARLEY", "BASIL", "BAYLEAF", "GREENBEAN", "LIMABEAN", "WHITEBEAN", "BEEFIGE", "REDBEET", "BLUEBERRY", "BROCCOLI", "CABBAGE", "MELON", "CARROT", "CASEIN", "CASHEWNUT", "CAULIFLOWER", "CELERY"     Note: Lab results reviewed.  PFSH  Drug: Rebecca Gilmore  reports no history of drug use. Alcohol:  reports no history of alcohol use. Tobacco:  reports that she quit smoking about 30 years ago. Her smoking use included cigarettes. She has quit using smokeless tobacco.  Her smokeless tobacco use included snuff and chew. Medical:  has a past medical history of Anemia, Arrhythmia, CHF (congestive heart failure) (HCC), Chronic kidney disease, Depression, HBP (high blood pressure), History of bladder problems, Memory loss, Muscle pain, Osteoporosis, Reflux, Sinus congestion, and Swelling. Family: family history includes AAA (abdominal aortic aneurysm) in her mother; Heart disease in her father and  mother.  Past Surgical History:  Procedure Laterality Date   ARTERY BIOPSY Bilateral 12/01/2016   Procedure: BIOPSY TEMPORAL ARTERY;  Surgeon: Arvil Lauber, MD;  Location: [facility] OR;  Service: Vascular;  Laterality: Bilateral;   CATARACT SURGERY     COLONOSCOPY WITH PROPOFOL  N/A 12/13/2021   Procedure: COLONOSCOPY WITH PROPOFOL ;  Surgeon: Luke Salaam, MD;  Location: [facility] ENDOSCOPY;  Service: Gastroenterology;  Laterality: N/A;   CORONARY/GRAFT ACUTE MI REVASCULARIZATION N/A 08/05/2022   Procedure: Coronary/Graft Acute MI Revascularization;  Surgeon: Percival Brace, MD;  Location: [facility] INVASIVE CV LAB;  Service: Cardiovascular;  Laterality: N/A;   ESOPHAGOGASTRODUODENOSCOPY (EGD) WITH PROPOFOL  N/A 12/12/2021   Procedure: ESOPHAGOGASTRODUODENOSCOPY (EGD) WITH PROPOFOL ;  Surgeon: Quintin Buckle, DO;  Location: [facility] ENDOSCOPY;  Service: Gastroenterology;  Laterality: N/A;   EYELID SURGERY Bilateral    GIVENS CAPSULE STUDY N/A 12/15/2021   Procedure: GIVENS CAPSULE STUDY;  Surgeon: Quintin Buckle, DO;  Location: [facility] ENDOSCOPY;  Service: Gastroenterology;  Laterality: N/A;   GIVENS CAPSULE STUDY N/A 01/28/2022   Procedure: GIVENS CAPSULE STUDY;  Surgeon: Selena Daily, MD;  Location: [facility] ENDOSCOPY;  Service: Gastroenterology;  Laterality: N/A;   GROWTH ON FACE     KNEE SURGERY Right    LEFT HEART CATH AND CORONARY ANGIOGRAPHY N/A 08/05/2022   Procedure: LEFT HEART CATH AND CORONARY ANGIOGRAPHY;  Surgeon: Percival Brace, MD;  Location: [facility] INVASIVE CV LAB;  Service: Cardiovascular;  Laterality: N/A;   THROMBECTOMY FEMORAL ARTERY Left 11/16/2021  Procedure: THROMBECTOMY FEMORAL ARTERY;  Surgeon: Arvil Lauber, MD;  Location: [facility] ORS;  Service: Vascular;  Laterality: Left;   Active Ambulatory Problems    Diagnosis Date Noted   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   Femoral artery occlusion, 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   GI bleeding 12/11/2021   Depression    HLD  (hyperlipidemia)    Iron  deficiency anemia    Chronic diastolic CHF (congestive heart failure) (HCC)    Myocardial injury    Atrial fibrillation, chronic (HCC)    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   Acute blood loss anemia 12/11/2021   Anemia    Acute heart failure (HCC) 03/05/2021   Acute respiratory failure with hypoxia (HCC) 03/05/2021   Bradycardia 06/27/2019   CKD (chronic kidney disease) stage 4, GFR 15-29 ml/min (HCC) 12/23/2021   Influenza A 03/05/2021   Severe aortic stenosis 09/21/2017   NSTEMI (non-ST elevated myocardial infarction) (HCC) 03/05/2021   Peripheral edema 01/18/2018   SOB (shortness of breath) on exertion 09/21/2017   Atrial fibrillation and flutter (HCC) 04/18/2021   Chronic kidney disease, stage 3b (HCC) 01/27/2022   Hypokalemia 01/27/2022   Acute on chronic blood loss anemia 01/27/2022   Symptomatic anemia 02/18/2022   Morbid obesity with BMI of 40.0-44.9, adult (HCC) 02/18/2022   Acute on chronic diastolic (congestive) heart failure (HCC) 02/18/2022   Anemia of chronic kidney failure, stage 4 (severe) (HCC) 02/20/2022   Anemia, unspecified 02/18/2022   History of recurrent GI bleed 07/19/2022   Anemia of chronic renal failure, stage 3b (HCC) 07/19/2022   Coronary artery disease 07/19/2022   Elevated troponin 07/19/2022   Hypotension 07/19/2022   Acute on chronic diastolic CHF (congestive heart failure) (HCC) 07/21/2022   Acute ST elevation myocardial infarction (STEMI) involving left anterior descending (LAD) coronary artery (HCC) 08/05/2022   Severe sepsis with acute organ dysfunction (HCC) 12/24/2022   CKD stage 3b, GFR 30-44 ml/min (HCC) 12/24/2022   AKI (acute kidney injury) (HCC) 12/24/2022   Leukocytosis 12/24/2022   Left leg pain 12/24/2022   Morbid obesity (HCC) 12/24/2022   E. coli septicemia (HCC) 12/25/2022   Metabolic acidosis 12/25/2022   Meralgia paraesthetica, left 07/27/2023   Neuropathic pain, leg, bilateral 07/27/2023    Resolved Ambulatory Problems    Diagnosis Date Noted   Acute renal failure superimposed on stage 3b chronic kidney disease (HCC)    Past Medical History:  Diagnosis Date   Arrhythmia    CHF (congestive heart failure) (HCC)    Chronic kidney disease    HBP (high blood pressure)    History of bladder problems    Memory loss    Muscle pain    Osteoporosis    Reflux    Sinus congestion    Swelling    Constitutional Exam  General appearance: Well nourished, well developed, and well hydrated. In no apparent acute distress Vitals:   07/27/23 0914  BP: (!) 118/50  Pulse: 70  Resp: 16  Temp: 97.9 F (36.6 C)  TempSrc: Temporal  SpO2: 100%  Weight: 197 lb (89.4 kg)  Height: 5\' 3"  (1.6 m)   BMI Assessment: Estimated body mass index is 34.9 kg/m as calculated from the following:   Height as of this encounter: 5\' 3"  (1.6 m).   Weight as of this encounter: 197 lb (89.4 kg).  BMI interpretation table: BMI level Category Range association with higher incidence of chronic pain  <18 kg/m2 Underweight   18.5-24.9 kg/m2 Ideal body weight   25-29.9 kg/m2 Overweight Increased incidence  by 20%  30-34.9 kg/m2 Obese (Class I) Increased incidence by 68%  35-39.9 kg/m2 Severe obesity (Class II) Increased incidence by 136%  >40 kg/m2 Extreme obesity (Class III) Increased incidence by 254%   Patient's current BMI Ideal Body weight  Body mass index is 34.9 kg/m. Ideal body weight: 52.4 kg (115 lb 8.3 oz) Adjusted ideal body weight: 67.2 kg (148 lb 1.8 oz)   BMI Readings from Last 4 Encounters:  07/27/23 34.90 kg/m  07/13/23 34.67 kg/m  04/13/23 34.40 kg/m  01/05/23 36.32 kg/m   Wt Readings from Last 4 Encounters:  07/27/23 197 lb (89.4 kg)  07/13/23 202 lb (91.6 kg)  04/13/23 200 lb 6.4 oz (90.9 kg)  01/05/23 211 lb 9.6 oz (96 kg)    Psych/Mental status: Alert, oriented x 3 (person, place, & time)       Eyes: PERLA Respiratory: No evidence of acute respiratory  distress  Patient presents in wheelchair Left lateral leg pain from left hip to left lateral thigh extending past knee Paresthesias of bilateral feet  Assessment  Primary Diagnosis & Pertinent Problem List: The primary encounter diagnosis was Meralgia paraesthetica, left. Diagnoses of Left leg pain and Neuropathic pain, leg, bilateral were also pertinent to this visit.  Visit Diagnosis (New problems to examiner): 1. Meralgia paraesthetica, left   2. Left leg pain   3. Neuropathic pain, leg, bilateral    Plan of Care (Initial workup plan)  Assessment and Plan    Left hip pain with neuropathy   Chronic left hip pain radiates to the ankle with numbness, worsened by leg movement and post-stent placement. MRI suggests possible spinal involvement, but the pain is likely due to the lateral femoral cutaneous nerve. Previous epidural injections provided no relief. Neuropathy is present but non-diabetic. Vascular evaluation shows good blood flow, indicating a non-vascular origin. Perform a lateral femoral cutaneous nerve block under ultrasound guidance to assess pain relief and determine nerve involvement. Prescribe a buprenorphine patch (Butrans) at the lowest dose for chronic pain management, changing the patch every seven days and alternating sites. Monitor for mood changes, constipation, or nausea. Continue Lyrica  and Tylenol  for pain management.  Chronic pain syndrome   Chronic pain syndrome persists with inadequate relief from Lyrica  and Tylenol .  Initiate a buprenorphine patch (Butrans) for consistent pain relief and reduced reliance on oral medication. Evaluate the response to the buprenorphine patch and adjust the dose if necessary.  Atrial fibrillation   Chronic atrial fibrillation is managed with a pacemaker. She is on clopidogrel  post-stent placement. Continue clopidogrel , holding for two days prior to the nerve block procedure.  Stent placement   Stent placement was followed by left  hip pain. Vascular evaluation indicates a non-vascular pain origin.  Pacemaker   A pacemaker is in place for atrial fibrillation management with no current issues. Routine monitoring of pacemaker function is advised.        Procedure Orders         FEMORAL NERVE BLOCK    Pharmacotherapy (current): Medications ordered:  Meds ordered this encounter  Medications   buprenorphine (BUTRANS) 5 MCG/HR PTWK    Sig: Place 1 patch onto the skin once a week.    Dispense:  4 patch    Refill:  1    Chronic Pain: STOP Act (Not applicable) Fill 1 day early if closed on refill date. Avoid benzodiazepines within 8 hours of opioids   Medications administered during this visit: Rebecca Gilmore had no medications administered during this visit.  Provider-requested follow-up: Return in about 15 days (around 08/11/2023) for Left LFCN, with US .  Future Appointments  Date Time Provider Department Center  08/11/2023 11:40 AM Cephus Collin, MD [facility] None  09/21/2023  1:00 PM Cherylin Corrigan, NP [facility] None  01/06/2024  1:00 PM [facility] None  01/11/2024  1:15 PM Timmy Forbes, MD [facility] None  01/11/2024  1:30 PM [facility] INFUSION CHAIR 2 [facility] None  06/08/2024  1:00 PM [facility] VASC 2 [facility] None  06/08/2024  2:00 PM Brown, Fallon E, NP [facility] None   I discussed the assessment and treatment plan with the patient. The patient was provided an opportunity to ask questions and all were answered. The patient agreed with the plan and demonstrated an understanding of the instructions.  Patient advised to call back or seek an in-person evaluation if the symptoms or condition worsens.  Duration of encounter: .  Total time on encounter, as per AMA guidelines included both the face-to-face and non-face-to-face time personally spent by the physician and/or other qualified health care professional(s) on the day of the encounter (includes time in activities that require the physician or other qualified  health care professional and does not include time in activities normally performed by clinical staff). Physician's time may include the following activities when performed: Preparing to see the patient (e.g., pre-charting review of records, searching for previously ordered imaging, lab work, and nerve conduction tests) Review of prior analgesic pharmacotherapies. Reviewing PMP Interpreting ordered tests (e.g., lab work, imaging, nerve conduction tests) Performing post-procedure evaluations, including interpretation of diagnostic procedures Obtaining and/or reviewing separately obtained history Performing a medically appropriate examination and/or evaluation Counseling and educating the patient/family/caregiver Ordering medications, tests, or procedures Referring and communicating with other health care professionals (when not separately reported) Documenting clinical information in the electronic or other health record Independently interpreting results (not separately reported) and communicating results to the patient/ family/caregiver Care coordination (not separately reported)  Note by: Cephus Collin, MD (TTS and AI technology used. I apologize for any typographical errors that were not detected and corrected.) Date: 07/27/2023; Time: 10:59 AM

## 2023-07-30 ENCOUNTER — Telehealth: Payer: Self-pay | Admitting: Student in an Organized Health Care Education/Training Program

## 2023-07-30 NOTE — Telephone Encounter (Signed)
 Daughter called stating patches need PA

## 2023-08-03 ENCOUNTER — Telehealth: Payer: Self-pay

## 2023-08-03 NOTE — Telephone Encounter (Signed)
 Daughter calling back to say to that patients insurance has not received information on PA for patches.

## 2023-08-03 NOTE — Telephone Encounter (Signed)
 PA redone via CMM.  kt

## 2023-08-03 NOTE — Telephone Encounter (Signed)
 Attempted on Friday and Covermymeds messed up and started spinning on me. Not operational

## 2023-08-04 NOTE — Telephone Encounter (Signed)
Did this get sent in?

## 2023-08-05 DIAGNOSIS — Z952 Presence of prosthetic heart valve: Secondary | ICD-10-CM | POA: Diagnosis not present

## 2023-08-05 DIAGNOSIS — R269 Unspecified abnormalities of gait and mobility: Secondary | ICD-10-CM | POA: Diagnosis not present

## 2023-08-05 DIAGNOSIS — E782 Mixed hyperlipidemia: Secondary | ICD-10-CM | POA: Diagnosis not present

## 2023-08-05 DIAGNOSIS — I2510 Atherosclerotic heart disease of native coronary artery without angina pectoris: Secondary | ICD-10-CM | POA: Diagnosis not present

## 2023-08-09 ENCOUNTER — Telehealth: Payer: Self-pay

## 2023-08-09 NOTE — Telephone Encounter (Signed)
 Insurance wont cover her patches because of her pacemaker and heart condition. Now she is questioning if her mom can even get the procedure because she has a Visual merchandiser. The daughter wants to make sure Dr. Rhesa Celeste knows she has a pacemaker and is it safe for her to have the procedure. Please call [facility] at [phone]

## 2023-08-10 NOTE — Telephone Encounter (Signed)
 Daughter notified

## 2023-08-11 ENCOUNTER — Ambulatory Visit
Attending: Student in an Organized Health Care Education/Training Program | Admitting: Student in an Organized Health Care Education/Training Program

## 2023-08-11 VITALS — BP 138/63 | HR 70 | Temp 97.3°F | Resp 16 | Ht 62.0 in | Wt 197.0 lb

## 2023-08-11 DIAGNOSIS — M79605 Pain in left leg: Secondary | ICD-10-CM

## 2023-08-11 DIAGNOSIS — G5712 Meralgia paresthetica, left lower limb: Secondary | ICD-10-CM | POA: Diagnosis not present

## 2023-08-11 DIAGNOSIS — M549 Dorsalgia, unspecified: Secondary | ICD-10-CM | POA: Insufficient documentation

## 2023-08-11 MED ORDER — ROPIVACAINE HCL 2 MG/ML IJ SOLN
9.0000 mL | Freq: Once | INTRAMUSCULAR | Status: AC
  Administered 2023-08-11: 9 mL via PERINEURAL
  Filled 2023-08-11: qty 20

## 2023-08-11 MED ORDER — DEXAMETHASONE SODIUM PHOSPHATE 10 MG/ML IJ SOLN
10.0000 mg | Freq: Once | INTRAMUSCULAR | Status: AC
  Administered 2023-08-11: 10 mg
  Filled 2023-08-11: qty 1

## 2023-08-11 NOTE — Progress Notes (Signed)
 Safety precautions to be maintained throughout the outpatient stay will include: orient to surroundings, keep bed in low position, maintain call bell within reach at all times, provide assistance with transfer out of bed and ambulation.

## 2023-08-11 NOTE — Patient Instructions (Signed)

## 2023-08-11 NOTE — Progress Notes (Signed)
 PROVIDER NOTE: Interpretation of information contained herein should be left to medically-trained personnel. Specific patient instructions are provided elsewhere under "Patient Instructions" section of medical record. This document was created in part using STT-dictation technology, any transcriptional errors that may result from this process are unintentional.  Patient: Rebecca Gilmore Type: Established DOB: 1940-06-18 MRN: [id number] PCP: Annette Barters, MD  Service: Procedure DOS: 08/11/2023 Setting: Ambulatory Location: Ambulatory outpatient facility Delivery: Face-to-face Provider: Cephus Collin, MD Specialty: Interventional Pain Management Specialty designation: 09 Location: Outpatient facility Ref. Prov.: Hamrick, Orest Bio, MD       Interventional Therapy   Primary Reason for Visit: Interventional Pain Management Treatment. CC: Back Pain    Procedure:          Anesthesia, Analgesia, Anxiolysis:  Type: Left lateral femoral cutaneous nerve block at the anterior superior iliac spine under US    Anesthesia: Local (1-2% Lidocaine )  Anxiolysis: None  Sedation: None  Guidance: Ultrasound           Position: Supine   1. Meralgia paraesthetica, left   2. Left leg pain    NAS-11 Pain score:   Pre-procedure: 7 /10   Post-procedure: 0-No pain/10     H&P (Pre-op Assessment):  Rebecca Gilmore is a 83 y.o. (year old), female patient, seen today for interventional treatment. 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 Ms.  Gilmore has a current medication list which includes the following prescription(s): acetaminophen , amitriptyline , buprenorphine , cholecalciferol , clopidogrel , cyanocobalamin , empagliflozin, iron , lidocaine , loratadine , nystatin, pantoprazole , potassium chloride , pregabalin , rosuvastatin , sertraline , and torsemide . Her primarily concern today is the Back Pain  Initial Vital Signs:  Pulse/HCG Rate: 70  Temp: (!) 97.3 F (36.3 C) Resp: 16 BP: 122/63 SpO2: 97 %  BMI: Estimated body mass index is 36.03 kg/m as calculated from the following:   Height as of this encounter: 5\' 2"  (1.575 m).   Weight as of this encounter: 197 lb (89.4 kg).  Risk Assessment: Allergies: Reviewed. She is allergic to chlorhexidine , morphine , other, morphine  and codeine, kenalog  [triamcinolone  acetonide], penicillins, latex, and povidone-iodine.  Allergy Precautions: None required Coagulopathies: Reviewed. None identified.  Blood-thinner therapy: None at this time Active Infection(s): Reviewed. None identified. Rebecca Gilmore is afebrile  Site Confirmation: Rebecca Gilmore was asked to confirm the procedure and laterality before marking the site Procedure checklist: Completed Consent: Before the procedure and under the influence of no sedative(s), amnesic(s), or anxiolytics, the patient was informed of the treatment options, risks and possible complications. To fulfill our ethical and legal obligations, as recommended by the American Medical Association's Code of Ethics, I have informed the patient of my clinical impression; the nature and purpose of the treatment or procedure; the risks, benefits, and possible complications of the intervention; the alternatives, including doing nothing; the risk(s) and benefit(s) of the alternative treatment(s) or procedure(s); and the risk(s) and benefit(s) of doing nothing. The patient was provided information about the general risks and possible complications associated with the  procedure. These may include, but are not limited to: failure to achieve desired goals, infection, bleeding, organ or nerve damage, allergic reactions, paralysis, and death. In addition, the patient was informed of those risks and  complications associated to the procedure, such as failure to decrease pain; infection; bleeding; organ or nerve damage with subsequent damage to sensory, motor, and/or autonomic systems, resulting in permanent pain, numbness, and/or weakness of one or several areas of the body; allergic reactions; (i.e.: anaphylactic reaction); and/or death. Furthermore, the patient was informed of those risks and complications associated with the medications. These include, but are not limited to: allergic reactions (i.e.: anaphylactic or anaphylactoid reaction(s)); adrenal axis suppression; blood sugar elevation that in diabetics may result in ketoacidosis or comma; water  retention that in patients with history of congestive heart failure may result in shortness of breath, pulmonary edema, and decompensation with resultant heart failure; weight gain; swelling or edema; medication-induced neural toxicity; particulate matter embolism and blood vessel occlusion with resultant organ, and/or nervous system infarction; and/or aseptic necrosis of one or more joints. Finally, the patient was informed that Medicine is not an exact science; therefore, there is also the possibility of unforeseen or unpredictable risks and/or possible complications that may result in a catastrophic outcome. The patient indicated having understood very clearly. We have given the patient no guarantees and we have made no promises. Enough time was given to the patient to ask questions, all of which were answered to the patient's satisfaction. Rebecca Gilmore has indicated that she wanted to continue with the procedure. Attestation: I, the ordering provider, attest that I have discussed with the patient the benefits, risks,  side-effects, alternatives, likelihood of achieving goals, and potential problems during recovery for the procedure that I have provided informed consent. Date  Time: 08/11/2023 11:21 AM  Pre-Procedure Preparation:  Monitoring: As per clinic protocol. Respiration, ETCO2, SpO2, BP, heart rate and rhythm monitor placed and checked for adequate function Safety Precautions: Patient was assessed for positional comfort and pressure points before starting the procedure. Time-out: I initiated and conducted the "Time-out" before starting the procedure, as per protocol. The patient was asked to participate by confirming the accuracy of the "Time Out" information. Verification of the correct person, site, and procedure were performed and confirmed by me, the nursing staff, and the patient. "Time-out" conducted as per Joint Commission's Universal Protocol (UP.01.01.01). Time: 1201 Start Time: 1201 hrs.  Description of Procedure:          Area Prepped: Entire Anterolateral hip area. Lateral Femoral Cutaneous Nerve (LFCN) Block: The transducer was moved laterally and superior to the anterior superior iliac spine (ASIS). The LFCN was identified as a hyperechoic structure traveling under the fascia lata, medial and inferior to the ASIS. Using in-plane technique, a 22-gauge echogenic needle was advanced toward the LFCN. After negative aspiration, 5 mL of solution containing 4cc of 0.2% Ropivacaine and 1 cc of Decadron  10 mg/cc was injected, visualizing spread around the LFC nerve.    Vitals:   08/11/23 1126 08/11/23 1209  BP: 122/63 138/63  Pulse:  70  Resp: 16 16  Temp: (!) 97.3 F (36.3 C)   SpO2: 97% 100%  Weight: 197 lb (89.4 kg)   Height: 5\' 2"  (1.575 m)     Start Time: 1201 hrs. End Time: 1206 hrs.     Materials:  Needle(s) Type: Regular needle Gauge: 22G Length: 1.5-in    Antibiotic Prophylaxis:   Anti-infectives (From admission, onward)    None      Indication(s): None  identified   Post-operative Assessment:  Post-procedure Vital Signs:  Pulse/HCG Rate: 70  Temp: (!) 97.3 F (36.3 C) Resp: 16 BP: 138/63 SpO2: 100 %  EBL: None  Complications: No immediate post-treatment complications observed by team, or reported by patient.  Note: The patient tolerated the entire procedure well. A repeat set of vitals were taken after the procedure and the patient was kept under observation following institutional policy, for this type of procedure. Post-procedural neurological assessment was performed, showing return to baseline, prior to discharge. The patient was provided with post-procedure discharge instructions, including a section on how to identify potential problems. Should any problems arise concerning this procedure, the patient was given instructions to immediately contact us , at any time, without hesitation. In any case, we plan to contact the patient by telephone for a follow-up status report regarding this interventional procedure.  Comments:  No additional relevant information.  Plan of Care (POC)  Orders:  No orders of the defined types were placed in this encounter.    Medications ordered for procedure: Meds ordered this encounter  Medications   ropivacaine (PF) 2 mg/mL (0.2%) (NAROPIN) injection 9 mL   dexamethasone  (DECADRON ) injection 10 mg   Medications administered: We administered ropivacaine (PF) 2 mg/mL (0.2%) and dexamethasone .  See the medical record for exact dosing, route, and time of administration.   Follow-up plan:   Return in about 4 weeks (around 09/08/2023), or PPE F2F.     Recent Visits Date Type Provider Dept  07/27/23 Office Visit Cephus Collin, MD [facility]  Showing recent visits within past 90 days and meeting all other requirements Today's Visits Date Type Provider Dept  08/11/23 Procedure visit Cephus Collin, MD [facility]  Showing today's visits and meeting all other requirements Future  Appointments Date Type Provider Dept  09/14/23 Appointment Cephus Collin, MD [facility]  Showing future appointments within next 90 days and meeting all other requirements   Disposition: Discharge home  Discharge (Date  Time): 08/11/2023; 1215 hrs.   Primary Care Physician: Annette Barters, MD Location: [facility] Note by: Cephus Collin, MD (TTS technology used. I apologize for any typographical errors that were not detected and corrected.) Date: 08/11/2023; Time: 1:04 PM  Disclaimer:  Medicine is not an Visual merchandiser. The only guarantee in medicine is that nothing is guaranteed. It is important to note that the decision to proceed with this intervention was based on the information collected from the patient. The Data and conclusions were drawn from the patient's questionnaire, the interview, and the physical examination. Because the information was provided in large part by the patient, it cannot be guaranteed that it has not been purposely or unconsciously manipulated. Every effort has been made to obtain as much relevant data as possible for this evaluation. It is important to note that the conclusions that lead to this procedure are derived in large part from the available data. Always take into account that the treatment will also be dependent on availability of resources and existing treatment guidelines, considered by other Pain Management Practitioners as being common knowledge and practice, at the time of the intervention. For Medico-Legal purposes, it is also important to point out that variation in procedural techniques and pharmacological choices are the acceptable norm. The indications, contraindications, technique, and results of the above procedure should only be interpreted and judged by a Board-Certified Interventional Pain Specialist with extensive familiarity and expertise in the same exact procedure and technique.

## 2023-08-12 ENCOUNTER — Telehealth: Payer: Self-pay | Admitting: *Deleted

## 2023-08-12 ENCOUNTER — Telehealth: Payer: Self-pay

## 2023-08-12 NOTE — Telephone Encounter (Signed)
 Returned patient phone call, she has pickedup her butrans  patches and now is afraid to apply them.  I have discussed with patient that this is more safe than the po pain meds that can be prescribed, also Dr Rhesa Celeste would not give this to her if he were concerned.  However, there is always a chance that she may not tolerate the medicine and should she experience any negative or adverse reaction to remove the patch.  Have also gotten Seema's advise and she is in agreement with this plan.  Patient verbalizes u/o information. Spoke back to her daughter and discussed this with her.

## 2023-08-12 NOTE — Telephone Encounter (Signed)
 Post procedure call; spoke with daughter first as her number is the contact.  She did give me her mothers, [phone], patient reports that she is doing well.  She is asking about her butrans  patches and states pharmacy has not filled because they were not approved.  I do have on file that Rx was approved through 06/25.    Called to [facility] and they have the Rx ready.  Called patient back to let her know.

## 2023-08-12 NOTE — Telephone Encounter (Signed)
 She has questions about the patches he prescribed yesterday. She wants to speak to a nurse as soon as possible.

## 2023-08-16 DIAGNOSIS — I214 Non-ST elevation (NSTEMI) myocardial infarction: Secondary | ICD-10-CM | POA: Diagnosis not present

## 2023-09-09 ENCOUNTER — Ambulatory Visit: Admitting: Student in an Organized Health Care Education/Training Program

## 2023-09-14 ENCOUNTER — Ambulatory Visit
Attending: Student in an Organized Health Care Education/Training Program | Admitting: Student in an Organized Health Care Education/Training Program

## 2023-09-14 ENCOUNTER — Encounter: Payer: Self-pay | Admitting: Student in an Organized Health Care Education/Training Program

## 2023-09-14 VITALS — BP 86/62 | HR 70 | Temp 97.9°F | Resp 16 | Ht 62.0 in | Wt 205.0 lb

## 2023-09-14 DIAGNOSIS — M79605 Pain in left leg: Secondary | ICD-10-CM | POA: Insufficient documentation

## 2023-09-14 DIAGNOSIS — G5712 Meralgia paresthetica, left lower limb: Secondary | ICD-10-CM | POA: Insufficient documentation

## 2023-09-14 DIAGNOSIS — M5412 Radiculopathy, cervical region: Secondary | ICD-10-CM | POA: Insufficient documentation

## 2023-09-14 DIAGNOSIS — G5793 Unspecified mononeuropathy of bilateral lower limbs: Secondary | ICD-10-CM | POA: Diagnosis not present

## 2023-09-14 MED ORDER — BUPRENORPHINE 7.5 MCG/HR TD PTWK: 1.0000 | MEDICATED_PATCH | TRANSDERMAL | 1 refills | Status: AC

## 2023-09-14 NOTE — Progress Notes (Signed)
 PROVIDER NOTE: Interpretation of information contained herein should be left to medically-trained personnel. Specific patient instructions are provided elsewhere under Patient Instructions section of medical record. This document was created in part using AI and STT-dictation technology, any transcriptional errors that may result from this process are unintentional.  Patient: Rebecca Gilmore  Service: E/M   PCP: Rebecca Charlene CROME, MD  DOB: 10-26-1940  DOS: 09/14/2023  Provider: Wallie Sherry, MD  MRN: [id number]  Delivery: Face-to-face  Specialty: Interventional Pain Management  Type: Established Patient  Setting: Ambulatory outpatient facility  Specialty designation: 09  Referring Prov.: Hamrick, Charlene CROME, MD  Location: Outpatient office facility       History of present illness (HPI) [name], a 83 y.o. year old female, is here today because of her Cervical radicular pain [M54.12]. Rebecca Gilmore primary complain today is Hip Pain (Left ) and Hand Pain (Bilateral numbness, left is worse )  Pertinent problems: Rebecca Gilmore does not have any pertinent problems on file.  Pain Assessment: Severity of Chronic pain is reported as a 2 /10. Location: Hip Left/into left groin or down the left leg. Onset: More than a month ago. Quality: Constant, Numbness. Timing: Constant. Modifying factor(s): tyelnol CR and tylenol  pain alternately.. Vitals:  height is 5' 2 (1.575 m) and weight is 205 lb (93 kg). Her temporal temperature is 97.9 F (36.6 C). Her blood pressure is 86/62 (abnormal) and her pulse is 70. Her respiration is 16 and oxygen  saturation is 99%.  BMI: Estimated body mass index is 37.49 kg/m as calculated from the following:   Height as of this encounter: 5' 2 (1.575 m).   Weight as of this encounter: 205 lb (93 kg).  Last encounter: 07/27/2023. Last procedure: 08/11/2023.  Reason for encounter:   Discussed the use of AI scribe software for clinical note transcription with the patient,  who gave verbal consent to proceed.  History of Present Illness   Rebecca Gilmore is an 83 year old female who presents with worsening leg and hand symptoms. She is accompanied by her daughter.  She experiences persistent pain in her left leg, radiating downwards, with variable intensity. The pain worsens in the afternoon and improves with rest, though it takes longer to subside when lying down. Movements such as twisting exacerbate the pain. An injection provided slight relief, but Gilmore, aspirin , and a patch have been less effective.  She also experiences numbness and a sensation of 'sandpaper under the fingers' in her hands, which she attributes to a fall resulting in a knot on her elbow. The numbness sometimes affects her entire arm, accompanied by occasional pain. 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  Her current medications include Plavix , which she has stopped in the past for procedures.     Post-Procedure Evaluation    Procedure:          Anesthesia, Analgesia, Anxiolysis:  Type: Left lateral femoral cutaneous nerve block at the anterior superior iliac spine under US    Anesthesia: Local (1-2% Lidocaine )  Anxiolysis: None  Sedation: None  Guidance: Ultrasound           Position: Supine   1. Meralgia paraesthetica, left   2. Left leg pain    NAS-11 Pain score:   Pre-procedure: 7 /10   Post-procedure: 0-No pain/10     Effectiveness:  Initial hour after procedure: 75 %  Subsequent 4-6 hours post-procedure: 75 % (patient spent a  lot of that time sleeping.)  Analgesia past initial 6 hours: 75 % (on day 2 patient felt so good that she did too much and that was the end of the pain relief.)  Ongoing improvement:  Analgesic:  30-40%    ROS  Constitutional: Denies any fever or chills Gastrointestinal: No reported hemesis, hematochezia, vomiting, or acute GI distress Musculoskeletal: left arm pain Neurological: No  reported episodes of acute onset apraxia, aphasia, dysarthria, agnosia, amnesia, paralysis, loss of coordination, or loss of consciousness  Medication Review  Iron , acetaminophen , amitriptyline , buprenorphine , cholecalciferol , clopidogrel , cyanocobalamin , empagliflozin, lidocaine , loratadine , nystatin, pantoprazole , potassium chloride , pregabalin , rosuvastatin , sertraline , and torsemide   History Review  Allergy: Rebecca Gilmore is allergic to chlorhexidine , morphine , other, morphine  and codeine, kenalog  [triamcinolone  acetonide], penicillins, latex, and povidone-iodine. Drug: Rebecca Gilmore  reports no history of drug use. Alcohol:  reports no history of alcohol use. Tobacco:  reports that she quit smoking about 30 years ago. Her smoking use included cigarettes. She has quit using smokeless tobacco.  Her smokeless tobacco use included snuff and chew. Social: Rebecca Gilmore  reports that she quit smoking about 30 years ago. Her smoking use included cigarettes. She has quit using smokeless tobacco.  Her smokeless tobacco use included snuff and chew. She reports that she does not drink alcohol and does not use drugs. Medical:  has a past medical history of Anemia, Arrhythmia, CHF (congestive heart failure) (HCC), Chronic kidney disease, Depression, HBP (high blood pressure), History of bladder problems, Memory loss, Muscle pain, Osteoporosis, Reflux, Sinus congestion, and Swelling. Surgical: Rebecca Gilmore  has a past surgical history that includes Knee surgery (Right); EYELID SURGERY (Bilateral); CATARACT SURGERY; GROWTH ON FACE; Artery Biopsy (Bilateral, 12/01/2016); Thrombectomy femoral artery (Left, 11/16/2021); Esophagogastroduodenoscopy (egd) with propofol  (N/A, 12/12/2021); Colonoscopy with propofol  (N/A, 12/13/2021); Givens capsule study (N/A, 12/15/2021); Givens capsule study (N/A, 01/28/2022); Coronary/Graft Acute MI Revascularization (N/A, 08/05/2022); and LEFT HEART CATH AND CORONARY ANGIOGRAPHY (N/A,  08/05/2022). Family: family history includes AAA (abdominal aortic aneurysm) in her mother; Heart disease in her father and mother.  Laboratory Chemistry Profile   Renal Lab Results  Component Value Date   BUN 21 12/28/2022   CREATININE 1.41 (H) 12/28/2022   GFRAA 37 (L) 01/23/2019   GFRNONAA 37 (L) 12/28/2022    Hepatic Lab Results  Component Value Date   AST 19 12/24/2022   ALT 10 12/24/2022   ALBUMIN 3.3 (L) 12/24/2022   ALKPHOS 61 12/24/2022    Electrolytes Lab Results  Component Value Date   NA 135 12/28/2022   K 3.9 12/28/2022   CL 104 12/28/2022   CALCIUM  8.3 (L) 12/28/2022   MG 1.7 12/28/2022   PHOS 2.9 12/28/2022    Bone No results found for: VD25OH, [id number], CI6874NY7, CI7874NY7, 25OHVITD1, 25OHVITD2, 25OHVITD3, TESTOFREE, TESTOSTERONE  Inflammation (CRP: Acute Phase) (ESR: Chronic Phase) Lab Results  Component Value Date   LATICACIDVEN 1.2 12/24/2022         Note: Above Lab results reviewed.  Recent Imaging Review  CLINICAL DATA:  Clemens from standing with head and neck trauma.   EXAM: CT HEAD WITHOUT CONTRAST   CT CERVICAL SPINE WITHOUT CONTRAST   TECHNIQUE: Multidetector CT imaging of the head and cervical spine was performed following the standard protocol without intravenous contrast. Multiplanar CT image reconstructions of the cervical spine were also generated.   RADIATION DOSE REDUCTION: This exam was performed according to the departmental dose-optimization program which includes automated exposure control, adjustment of the mA and/or kV according to patient size  and/or use of iterative reconstruction technique.   COMPARISON:  Head CT without contrast 11/19/2021, soft tissue neck CT with contrast and reconstructions 05/29/2008.   FINDINGS: CT HEAD FINDINGS   Brain: There is mild cerebral atrophy, small-vessel disease and atrophic ventriculomegaly with unremarkable cerebellum and brainstem. No hemorrhage, infarct  or mass are seen. There is no midline shift. Basal cisterns are clear.   Vascular: Moderate to heavy calcific plaque both siphons. No hyperdense central vessels.   Skull: Negative for fractures or focal lesions. Large posterior left parieto-occipital scalp hematoma.   Sinuses/Orbits: No acute abnormality. Clear sinuses and right mastoids with slight left deviation of nasal septum. Trace chronic fluid left mastoid tip with otherwise clear left mastoids.   Other: None.   CT CERVICAL SPINE FINDINGS   Alignment: Normal with again noted narrowing and spurring of the anterior atlantodental joint.   Skull base and vertebrae: No acute fracture is evident. No primary bone lesion or focal pathologic process.   Soft tissues and spinal canal: No prevertebral fluid or swelling. No visible canal hematoma. Both proximal cervical ICAs are extensively calcified and almost certainly have flow-limiting stenoses. Significant progression in the degree of calcification since 2010.   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 This reduces the effective AP diameter of the thecal sac to 7 mm at most cervical levels.   As a result, relatively mild posterior disc osteophyte complexes at C5-6 and C6-7, both eccentric to the left, cause moderate spinal canal stenosis with spondylotic cord compression greater on the left.   The discs are degenerated with prominent anterior osteophytes at both C5-6 and C6-7, with mild-to-moderate disc space loss at C7-T1.   The discs are maintained in height at C2-3, C3-4 and C4-5, with no other levels showing significant soft tissue or bony encroachment on the thecal sac.   There are mild facet and uncinate spurring changes, with acquired foraminal stenosis which is severe on the left and moderate on the right at C6-7 and bilaterally  mild at C5-6. Other foramina are clear.   Upper chest: There is aortic arch atherosclerosis with heavily calcified and almost certainly severely stenotic if not occluded left subclavian artery origin.   This may predispose to subclavian steal syndrome. There is a dual lead left chest pacing system on the left.   Other: None.   IMPRESSION: 1. Large posterior left parieto-occipital scalp hematoma with no acute intracranial CT findings or depressed skull fractures. 2. Chronic changes with mild atrophy and small-vessel disease. 3. No evidence of cervical fractures or malalignment. 4. Degenerative changes of the cervical spine superimposed on congenitally short pedicles. 5. Moderate spinal canal stenosis with spondylotic cord compression at C5-6 and C6-7, greater on the left. 6. Severely calcified and almost certainly severely stenotic if not occluded left subclavian artery origin. This may predispose to subclavian steal syndrome. 7. Extensively calcified proximal cervical ICAs, almost certainly with flow-limiting stenoses. 8. Aortic atherosclerosis.   Aortic Atherosclerosis (ICD10-I70.0).     Electronically Signed   By: Francis Quam M.D.   On: 10/16/2022 21:38   Note: Reviewed        Physical Exam  Vitals: BP (!) 86/62 (BP Location: Left Arm, Patient Position: Sitting, Cuff Size: Normal)   Pulse 70   Temp 97.9 F (36.6 C) (Temporal)   Resp 16   Ht 5' 2 (1.575 m)  Wt 205 lb (93 kg)   SpO2 99%   BMI 37.49 kg/m  BMI: Estimated body mass index is 37.49 kg/m as calculated from the following:   Height as of this encounter: 5' 2 (1.575 m).   Weight as of this encounter: 205 lb (93 kg). Ideal: Ideal body weight: 50.1 kg (110 lb 7.2 oz) Adjusted ideal body weight: 67.3 kg (148 lb 4.3 oz) General appearance: Well nourished, well developed, and well hydrated. In no apparent acute distress Mental status: Alert, oriented x 3 (person, place, & time)       Respiratory: No  evidence of acute respiratory distress Eyes: PERLA  Cervical Spine Area Exam  Skin & Axial Inspection: No masses, redness, edema, swelling, or associated skin lesions Alignment: Symmetrical Functional ROM: Pain restricted ROM, to the left Stability: No instability detected Muscle Tone/Strength: Functionally intact. No obvious neuro-muscular anomalies detected. Sensory (Neurological): Dermatomal pain pattern Palpation: No palpable anomalies             Upper Extremity (UE) Exam    Side: Right upper extremity  Side: Left upper extremity  Skin & Extremity Inspection: Skin color, temperature, and hair growth are WNL. No peripheral edema or cyanosis. No masses, redness, swelling, asymmetry, or associated skin lesions. No contractures.  Skin & Extremity Inspection: Skin color, temperature, and hair growth are WNL. No peripheral edema or cyanosis. No masses, redness, swelling, asymmetry, or associated skin lesions. No contractures.  Functional ROM: Pain restricted ROM for shoulder and elbow  Functional ROM: Unrestricted ROM          Muscle Tone/Strength: Functionally intact. No obvious neuro-muscular anomalies detected.  Muscle Tone/Strength: Functionally intact. No obvious neuro-muscular anomalies detected.  Sensory (Neurological): Dermatomal pain pattern          Sensory (Neurological): Unimpaired          Palpation: No palpable anomalies              Palpation: No palpable anomalies              Provocative Test(s):  Phalen's test: deferred Tinel's test: deferred Apley's scratch test (touch opposite shoulder):  Action 1 (Across chest): Decreased ROM Action 2 (Overhead): Decreased ROM Action 3 (LB reach): Decreased ROM   Provocative Test(s):  Phalen's test: deferred Tinel's test: deferred Apley's scratch test (touch opposite shoulder):  Action 1 (Across chest): deferred Action 2 (Overhead): deferred Action 3 (LB reach): deferred      Assessment   Diagnosis Status  1. Cervical  radicular pain   2. Meralgia paraesthetica, left   3. Left leg pain   4. Neuropathic pain, leg, bilateral    Having a Flare-up Responding Controlled   Updated Problems: Problem  Cervical Radicular Pain    Plan of Care  Assessment and Plan    Cervical radiculopathy   Cervical radiculopathy presents with numbness and a sandpaper-like sensation in the hands, likely due to nerve compression at C6 and C7, possibly worsened by cervical spine arthritis and stenosis. Plan for an epidural steroid injection at the C6-C7 interspace to alleviate nerve pain. Discontinue Plavix  7 days before the procedure and take 81 mg aspirin  instead during this period. Perform the procedure with local anesthesia, as done previously.  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 Consider diagnostic nerve blocks targeting cervical medial branch nerves to assess arthritis-related pain. If diagnostic blocks provide relief, plan for potential nerve ablation for longer-term pain  management.  Lumbar radiculopathy   Persistent lumbar radiculopathy with pain radiating down the left leg shows slight improvement with previous interventions but remains variable and worsens with certain movements. Monitor leg pain and consider repeating the nerve block if symptoms worsen. Evaluate the effectiveness of current pain management strategies, including Butrans  patches.  Hip pain   Hip pain, previously managed with epidural injections, is currently well-managed with no significant issues reported. Monitor for any recurrence of hip pain and reassess if symptoms return.   Chronic pain management: No side effects with Butrans  patch at 5 mcg an hour.  Increased to 7.5 mcg/hr and then assess response      Pharmacotherapy (Medications Ordered): Meds ordered this encounter  Medications   buprenorphine  (BUTRANS ) 7.5 MCG/HR    Sig: Place 1 patch onto the skin  once a week.    Dispense:  4 patch    Refill:  1    Chronic Pain: STOP Act (Not applicable) Fill 1 day early if closed on refill date. Avoid benzodiazepines within 8 hours of opioids   Orders:  No orders of the defined types were placed in this encounter.   Return in about 22 days (around 10/06/2023) for Left C7-T1 ESI, in clinic [facility] (dc Plavix  7 days prior, take ASA 81 instead).    Recent Visits Date Type Provider Dept  08/11/23 Procedure visit Marcelino Nurse, MD [facility]  07/27/23 Office Visit Marcelino Nurse, MD [facility]  Showing recent visits within past 90 days and meeting all other requirements Today's Visits Date Type Provider Dept  09/14/23 Office Visit Marcelino Nurse, MD [facility]  Showing today's visits and meeting all other requirements Future Appointments No visits were found meeting these conditions. Showing future appointments within next 90 days and meeting all other requirements  I discussed the assessment and treatment plan with the patient. The patient was provided an opportunity to ask questions and all were answered. The patient agreed with the plan and demonstrated an understanding of the instructions.  Patient advised to call back or seek an in-person evaluation if the symptoms or condition worsens.  Duration of encounter: .  Total time on encounter, as per AMA guidelines included both the face-to-face and non-face-to-face time personally spent by the physician and/or other qualified health care professional(s) on the day of the encounter (includes time in activities that require the physician or other qualified health care professional and does not include time in activities normally performed by clinical staff). Physician's time may include the following activities when performed: Preparing to see the patient (e.g., pre-charting review of records, searching for previously ordered imaging, lab work, and nerve conduction  tests) Review of prior analgesic pharmacotherapies. Reviewing PMP Interpreting ordered tests (e.g., lab work, imaging, nerve conduction tests) Performing post-procedure evaluations, including interpretation of diagnostic procedures Obtaining and/or reviewing separately obtained history Performing a medically appropriate examination and/or evaluation Counseling and educating the patient/family/caregiver Ordering medications, tests, or procedures Referring and communicating with other health care professionals (when not separately reported) Documenting clinical information in the electronic or other health record Independently interpreting results (not separately reported) and communicating results to the patient/ family/caregiver Care coordination (not separately reported)  Note by: Nurse Marcelino, MD (TTS and AI technology used. I apologize for any typographical errors that were not detected and corrected.) Date: 09/14/2023; Time: 3:04 PM

## 2023-09-14 NOTE — Progress Notes (Signed)
 Safety precautions to be maintained throughout the outpatient stay will include: orient to surroundings, keep bed in low position, maintain call bell within reach at all times, provide assistance with transfer out of bed and ambulation.

## 2023-09-15 ENCOUNTER — Telehealth: Payer: Self-pay

## 2023-09-15 DIAGNOSIS — I214 Non-ST elevation (NSTEMI) myocardial infarction: Secondary | ICD-10-CM | POA: Diagnosis not present

## 2023-09-15 NOTE — Telephone Encounter (Signed)
 Telephone call from patients daughter stating that patient went to the pharmacy to pick up her Butrans  patch which was increased from 5mg  to 7.5mg , Patient was told by Pharmacists that the medication would lower her heart rate. Patient did not pick up medication, as she was frightened that this may happen.  Patient and daughter are asking if this is a severe threat, or if they need to follow up with Cardiology before starting the medication?

## 2023-09-20 NOTE — Telephone Encounter (Signed)
 Spoke with patients daughter, Informed that Dr. Charolotte says patient is safe to take Butrans  patches.

## 2023-09-21 ENCOUNTER — Encounter: Admitting: Nurse Practitioner

## 2023-10-05 DIAGNOSIS — R0602 Shortness of breath: Secondary | ICD-10-CM | POA: Diagnosis not present

## 2023-10-05 DIAGNOSIS — Z952 Presence of prosthetic heart valve: Secondary | ICD-10-CM | POA: Diagnosis not present

## 2023-10-05 DIAGNOSIS — I4891 Unspecified atrial fibrillation: Secondary | ICD-10-CM | POA: Diagnosis not present

## 2023-10-05 DIAGNOSIS — R001 Bradycardia, unspecified: Secondary | ICD-10-CM | POA: Diagnosis not present

## 2023-10-05 DIAGNOSIS — I453 Trifascicular block: Secondary | ICD-10-CM | POA: Diagnosis not present

## 2023-10-05 DIAGNOSIS — I10 Essential (primary) hypertension: Secondary | ICD-10-CM | POA: Diagnosis not present

## 2023-10-05 DIAGNOSIS — I5033 Acute on chronic diastolic (congestive) heart failure: Secondary | ICD-10-CM | POA: Diagnosis not present

## 2023-10-05 DIAGNOSIS — I4892 Unspecified atrial flutter: Secondary | ICD-10-CM | POA: Diagnosis not present

## 2023-10-05 DIAGNOSIS — Z950 Presence of cardiac pacemaker: Secondary | ICD-10-CM | POA: Diagnosis not present

## 2023-10-05 DIAGNOSIS — I214 Non-ST elevation (NSTEMI) myocardial infarction: Secondary | ICD-10-CM | POA: Diagnosis not present

## 2023-10-05 DIAGNOSIS — E785 Hyperlipidemia, unspecified: Secondary | ICD-10-CM | POA: Diagnosis not present

## 2023-10-05 DIAGNOSIS — I2510 Atherosclerotic heart disease of native coronary artery without angina pectoris: Secondary | ICD-10-CM | POA: Diagnosis not present

## 2023-10-07 ENCOUNTER — Telehealth: Payer: Self-pay | Admitting: Student in an Organized Health Care Education/Training Program

## 2023-10-07 DIAGNOSIS — M5412 Radiculopathy, cervical region: Secondary | ICD-10-CM

## 2023-10-07 NOTE — Telephone Encounter (Signed)
 Please enter order so Rebecca Gilmore can obtain authorization for this procedure   Return in about 22 days (around 10/06/2023) for Left C7-T1 ESI, in clinic [facility] (dc Plavix  7 days prior, take ASA 81 instead).

## 2023-10-12 DIAGNOSIS — I739 Peripheral vascular disease, unspecified: Secondary | ICD-10-CM | POA: Diagnosis not present

## 2023-10-12 DIAGNOSIS — E782 Mixed hyperlipidemia: Secondary | ICD-10-CM | POA: Diagnosis not present

## 2023-10-12 DIAGNOSIS — I5030 Unspecified diastolic (congestive) heart failure: Secondary | ICD-10-CM | POA: Diagnosis not present

## 2023-10-12 DIAGNOSIS — Z79899 Other long term (current) drug therapy: Secondary | ICD-10-CM | POA: Diagnosis not present

## 2023-10-12 DIAGNOSIS — I482 Chronic atrial fibrillation, unspecified: Secondary | ICD-10-CM | POA: Diagnosis not present

## 2023-10-12 DIAGNOSIS — I350 Nonrheumatic aortic (valve) stenosis: Secondary | ICD-10-CM | POA: Diagnosis not present

## 2023-10-12 DIAGNOSIS — D649 Anemia, unspecified: Secondary | ICD-10-CM | POA: Diagnosis not present

## 2023-10-12 DIAGNOSIS — R7989 Other specified abnormal findings of blood chemistry: Secondary | ICD-10-CM | POA: Diagnosis not present

## 2023-10-12 DIAGNOSIS — K219 Gastro-esophageal reflux disease without esophagitis: Secondary | ICD-10-CM | POA: Diagnosis not present

## 2023-10-12 DIAGNOSIS — R202 Paresthesia of skin: Secondary | ICD-10-CM | POA: Diagnosis not present

## 2023-10-12 DIAGNOSIS — N184 Chronic kidney disease, stage 4 (severe): Secondary | ICD-10-CM | POA: Diagnosis not present

## 2023-10-16 DIAGNOSIS — I214 Non-ST elevation (NSTEMI) myocardial infarction: Secondary | ICD-10-CM | POA: Diagnosis not present

## 2023-10-20 ENCOUNTER — Ambulatory Visit
Admission: RE | Admit: 2023-10-20 | Discharge: 2023-10-20 | Disposition: A | Discharge status: Home / Self Care | Source: Ambulatory Visit | Attending: Student in an Organized Health Care Education/Training Program | Admitting: Student in an Organized Health Care Education/Training Program

## 2023-10-20 ENCOUNTER — Encounter: Payer: Self-pay | Admitting: Student in an Organized Health Care Education/Training Program

## 2023-10-20 ENCOUNTER — Ambulatory Visit
Attending: Student in an Organized Health Care Education/Training Program | Admitting: Student in an Organized Health Care Education/Training Program

## 2023-10-20 VITALS — BP 172/96 | HR 71 | Temp 97.9°F | Resp 18 | Ht 60.0 in | Wt 206.0 lb

## 2023-10-20 DIAGNOSIS — M5412 Radiculopathy, cervical region: Secondary | ICD-10-CM

## 2023-10-20 MED ORDER — LIDOCAINE HCL (PF) 2 % IJ SOLN
INTRAMUSCULAR | Status: AC
  Filled 2023-10-20: qty 5

## 2023-10-20 MED ORDER — IOHEXOL 180 MG/ML  SOLN
INTRAMUSCULAR | Status: AC
Start: 2023-10-20 — End: 2023-10-20
  Filled 2023-10-20: qty 20

## 2023-10-20 MED ORDER — ROPIVACAINE HCL 2 MG/ML IJ SOLN
1.0000 mL | Freq: Once | INTRAMUSCULAR | Status: AC
  Administered 2023-10-20 (×2): 1 mL via EPIDURAL

## 2023-10-20 MED ORDER — DEXAMETHASONE SODIUM PHOSPHATE 10 MG/ML IJ SOLN
10.0000 mg | Freq: Once | INTRAMUSCULAR | Status: AC
  Administered 2023-10-20 (×2): 10 mg

## 2023-10-20 MED ORDER — ROPIVACAINE HCL 2 MG/ML IJ SOLN
INTRAMUSCULAR | Status: AC
Start: 2023-10-20 — End: 2023-10-20
  Filled 2023-10-20: qty 20

## 2023-10-20 MED ORDER — DEXAMETHASONE SODIUM PHOSPHATE 10 MG/ML IJ SOLN
INTRAMUSCULAR | Status: AC
  Filled 2023-10-20: qty 1

## 2023-10-20 MED ORDER — SODIUM CHLORIDE 0.9% FLUSH
1.0000 mL | Freq: Once | INTRAVENOUS | Status: AC
  Administered 2023-10-20 (×2): 1 mL

## 2023-10-20 MED ORDER — IOHEXOL 180 MG/ML  SOLN
10.0000 mL | Freq: Once | INTRAMUSCULAR | Status: AC
  Administered 2023-10-20 (×2): 10 mL via EPIDURAL

## 2023-10-20 MED ORDER — SODIUM CHLORIDE (PF) 0.9 % IJ SOLN
INTRAMUSCULAR | Status: AC
  Filled 2023-10-20: qty 10

## 2023-10-20 NOTE — Progress Notes (Signed)
 Safety precautions to be maintained throughout the outpatient stay will include: orient to surroundings, keep bed in low position, maintain call bell within reach at all times, provide assistance with transfer out of bed and ambulation.

## 2023-10-20 NOTE — Patient Instructions (Signed)
 RESTART PLAVIX  TOMORROW THURSDAY 10/21/2023. Pain Management Discharge Instructions  General Discharge Instructions :  If you need to reach your doctor call: Monday-Friday 8:00 am - 4:00 pm at [phone] or toll free [phone].  After clinic hours [phone] to have operator reach doctor.  Bring all of your medication bottles to all your appointments in the pain clinic.  To cancel or reschedule your appointment with Pain Management please remember to call 24 hours in advance to avoid a fee.  Refer to the educational materials which you have been given on: General Risks, I had my Procedure. Discharge Instructions, Post Sedation.  Post Procedure Instructions:  The drugs you were given will stay in your system until tomorrow, so for the next 24 hours you should not drive, make any legal decisions or drink any alcoholic beverages.  You may eat anything you prefer, but it is better to start with liquids then soups and crackers, and gradually work up to solid foods.  Please notify your doctor immediately if you have any unusual bleeding, trouble breathing or pain that is not related to your normal pain.  Depending on the type of procedure that was done, some parts of your body may feel week and/or numb.  This usually clears up by tonight or the next day.  Walk with the use of an assistive device or accompanied by an adult for the 24 hours.  You may use ice on the affected area for the first 24 hours.  Put ice in a Ziploc bag and cover with a towel and place against area 15 minutes on 15 minutes off.  You may switch to heat after 24 hours.Epidural Steroid Injection Patient Information  Description: The epidural space surrounds the nerves as they exit the spinal cord.  In some patients, the nerves can be compressed and inflamed by a bulging disc or a tight spinal canal (spinal stenosis).  By injecting steroids into the epidural space, we can bring irritated nerves into direct contact  with a potentially helpful medication.  These steroids act directly on the irritated nerves and can reduce swelling and inflammation which often leads to decreased pain.  Epidural steroids may be injected anywhere along the spine and from the neck to the low back depending upon the location of your pain.   After numbing the skin with local anesthetic (like Novocaine), a small needle is passed into the epidural space slowly.  You may experience a sensation of pressure while this is being done.  The entire block usually last less than 10 minutes.  Conditions which may be treated by epidural steroids:  Low back and leg pain Neck and arm pain Spinal stenosis Post-laminectomy syndrome Herpes zoster (shingles) pain Pain from compression fractures  Preparation for the injection:  Do not eat any solid food or dairy products within 8 hours of your appointment.  You may drink clear liquids up to 3 hours before appointment.  Clear liquids include water , black coffee, juice or soda.  No milk or cream please. You may take your regular medication, including pain medications, with a sip of water  before your appointment  Diabetics should hold regular insulin (if taken separately) and take 1/2 normal NPH dos the morning of the procedure.  Carry some sugar containing items with you to your appointment. A driver must accompany you and be prepared to drive you home after your procedure.  Bring all your current medications with your. An IV may be inserted and sedation may be given at the discretion  of the physician.   A blood pressure cuff, EKG and other monitors will often be applied during the procedure.  Some patients may need to have extra oxygen  administered for a short period. You will be asked to provide medical information, including your allergies, prior to the procedure.  We must know immediately if you are taking blood thinners (like Coumadin/Warfarin)  Or if you are allergic to IV iodine contrast (dye).  We must know if you could possible be pregnant.  Possible side-effects: Bleeding from needle site Infection (rare, may require surgery) Nerve injury (rare) Numbness & tingling (temporary) Difficulty urinating (rare, temporary) Spinal headache ( a headache worse with upright posture) Light -headedness (temporary) Pain at injection site (several days) Decreased blood pressure (temporary) Weakness in arm/leg (temporary) Pressure sensation in back/neck (temporary)  Call if you experience: Fever/chills associated with headache or increased back/neck pain. Headache worsened by an upright position. New onset weakness or numbness of an extremity below the injection site Hives or difficulty breathing (go to the emergency room) Inflammation or drainage at the infection site Severe back/neck pain Any new symptoms which are concerning to you  Please note:  Although the local anesthetic injected can often make your back or neck feel good for several hours after the injection, the pain will likely return.  It takes 3-7 days for steroids to work in the epidural space.  You may not notice any pain relief for at least that one week.  If effective, we will often do a series of three injections spaced 3-6 weeks apart to maximally decrease your pain.  After the initial series, we generally will wait several months before considering a repeat injection of the same type.  If you have any questions, please call [phone] [facility]

## 2023-10-20 NOTE — Progress Notes (Signed)
 PROVIDER NOTE: Interpretation of information contained herein should be left to medically-trained personnel. Specific patient instructions are provided elsewhere under Patient Instructions section of medical record. This document was created in part using STT-dictation technology, any transcriptional errors that may result from this process are unintentional.  Patient: Rebecca Gilmore Type: Established DOB: [birth year] MRN: [id number] PCP: Stephanie Charlene CROME, MD  Service: Procedure DOS: 10/20/2023 Setting: Ambulatory Location: Ambulatory outpatient facility Delivery: Face-to-face Provider: Wallie Sherry, MD Specialty: Interventional Pain Management Specialty designation: 09 Location: Outpatient facility Ref. Prov.: Hamrick, Charlene CROME, MD       Interventional Therapy   Type: Cervical Epidural Steroid injection (CESI) (Interlaminar) #1  Laterality: Left (-LT)  Level: C7-T1 DOS: 10/20/2023  Provider: Wallie Sherry, MD Imaging: Fluoroscopy-guided Spinal ([id number]) Anesthesia: Local anesthesia (1-2% Lidocaine )  Medical Necessity Purpose: Diagnostic/Therapeutic Rationale (medical necessity): procedure needed and proper for the diagnosis and/or treatment of Rebecca Gilmore's medical symptoms and needs. Indications: Cervicalgia, cervical radicular pain, degenerative disc disease, severe enough to impact quality of life or function. 1. Cervical radicular pain    NAS-11 Pain score:   Pre-procedure: 10-Worst pain ever/10   Post-procedure: 0-No pain/10     Patient stopped Plavix  7 days prior  Position  Prep  Materials:  Location setting: Procedure suite Position: Prone, on modified reverse trendelenburg to facilitate breathing, with head in head-cradle. Pillows positioned under chest (below chin-level) with cervical spine flexed. Safety Precautions: Patient was assessed for positional comfort and pressure points before starting the procedure. Prepping solution: DuraPrep (Iodine Povacrylex [0.7%  available iodine] and Isopropyl Alcohol, 74% w/w) Prep Area: Entire  cervicothoracic region Approach: percutaneous, paramedial Intended target: Posterior cervical epidural space Materials Procedure:  Tray: Epidural Needle(s): Epidural (Tuohy) Qty: 1 Length: (90mm) 3.5-inch Gauge: 22G  H&P (Pre-op Assessment):  Rebecca Gilmore is a 83 y.o. (year old), female patient, seen today for interventional treatment. 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 Rebecca Gilmore has a current medication list which includes the following prescription(s): acetaminophen , amitriptyline , buprenorphine , cholecalciferol , clopidogrel , empagliflozin, iron , lidocaine , loratadine , pantoprazole , pregabalin , rosuvastatin , sertraline , torsemide , cyanocobalamin , nystatin, and potassium chloride . Her primarily concern today is the Hip Pain (Left Hip, radiates down left leg. Denies affecting foot and toes )  Initial Vital Signs:  Pulse/HCG Rate: 71ECG Heart Rate: 70 Temp: 97.9 F (36.6 C) Resp: 18 BP: 102/84 SpO2: 100 %  BMI: Estimated body mass index is 40.23 kg/m as calculated from the following:   Height as of this encounter: 5' (1.524 m).   Weight as of this encounter: 206 lb (93.4 kg).  Risk Assessment: Allergies: Reviewed. She is allergic to chlorhexidine , morphine , other, morphine  and codeine, kenalog  [triamcinolone  acetonide], penicillins, latex, and povidone-iodine.  Allergy Precautions: None required Coagulopathies: Reviewed. None  identified.  Blood-thinner therapy: None at this time Active Infection(s): Reviewed. None identified. Rebecca Gilmore is afebrile  Site Confirmation: Rebecca Gilmore was asked to confirm the procedure and laterality before marking the site Procedure checklist: Completed Consent: Before the procedure and under the influence of no sedative(s), amnesic(s), or anxiolytics, the patient was informed of the treatment options, risks and possible complications. To fulfill our ethical and legal obligations, as recommended by the  American Medical Association's Code of Ethics, I have informed the patient of my clinical impression; the nature and purpose of the treatment or procedure; the risks, benefits, and possible complications of the intervention; the alternatives, including doing nothing; the risk(s) and benefit(s) of the alternative treatment(s) or procedure(s); and the risk(s) and benefit(s) of doing nothing. The patient was provided information about the general risks and possible complications associated with the procedure. These may include, but are not limited to: failure to achieve desired goals, infection, bleeding, organ or nerve damage, allergic reactions, paralysis, and death. In addition, the patient was informed of those risks and complications associated to Spine-related procedures, such as failure to decrease pain; infection (i.e.: Meningitis, epidural or intraspinal abscess); bleeding (i.e.: epidural hematoma, subarachnoid hemorrhage, or any other type of intraspinal or peri-dural bleeding); organ or nerve damage (i.e.: Any type of peripheral nerve, nerve root, or spinal cord injury) with subsequent damage to sensory, motor, and/or autonomic systems, resulting in permanent pain, numbness, and/or weakness of one or several areas of the body; allergic reactions; (i.e.: anaphylactic reaction); and/or death. Furthermore, the patient was informed of those risks and complications associated with the  medications. These include, but are not limited to: allergic reactions (i.e.: anaphylactic or anaphylactoid reaction(s)); adrenal axis suppression; blood sugar elevation that in diabetics may result in ketoacidosis or comma; water  retention that in patients with history of congestive heart failure may result in shortness of breath, pulmonary edema, and decompensation with resultant heart failure; weight gain; swelling or edema; medication-induced neural toxicity; particulate matter embolism and blood vessel occlusion with resultant organ, and/or nervous system infarction; and/or aseptic necrosis of one or more joints. Finally, the patient was informed that Medicine is not an exact science; therefore, there is also the possibility of unforeseen or unpredictable risks and/or possible complications that may result in a catastrophic outcome. The patient indicated having understood very clearly. We have given the patient no guarantees and we have made no promises. Enough time was given to the patient to ask questions, all of which were answered to the patient's satisfaction. Rebecca Gilmore has indicated that she wanted to continue with the procedure. Attestation: I, the ordering provider, attest that I have discussed with the patient the benefits, risks, side-effects, alternatives, likelihood of achieving goals, and potential problems during recovery for the procedure that I have provided informed consent. Date  Time: 10/20/2023 10:39 AM  Pre-Procedure Preparation:  Monitoring: As per clinic protocol. Respiration, ETCO2, SpO2, BP, heart rate and rhythm monitor placed and checked for adequate function Safety Precautions: Patient was assessed for positional comfort and pressure points before starting the procedure. Time-out: I initiated and conducted the Time-out before starting the procedure, as per protocol. The patient was asked to participate by confirming the accuracy of the Time Out information.  Verification of the correct person, site, and procedure were performed and confirmed by me, the nursing staff, and the patient. Time-out conducted as per Joint Commission's Universal Protocol (UP.01.01.01). Time: 1114 Start Time: 1114 hrs.  Description  Narrative of Procedure:          Rationale (medical necessity): procedure needed and proper for the diagnosis and/or treatment of the patient's medical symptoms and needs. Start Time: 1114 hrs. Safety Precautions: Aspiration looking for blood return was conducted prior to all injections. At no point did we inject any substances, as a needle was being advanced. No attempts were made at seeking any paresthesias. Safe injection practices and needle disposal techniques used. Medications properly checked for  expiration dates. SDV (single dose vial) medications used. Description of procedure: Protocol guidelines were followed. The patient was assisted into a comfortable position. The target area was identified and the area prepped in the usual manner. Skin & deeper tissues infiltrated with local anesthetic. Appropriate amount of time allowed to pass for local anesthetics to take effect. Using fluoroscopic guidance, the epidural needle was introduced through the skin, ipsilateral to the reported pain, and advanced to the target area. Posterior laminar os was contacted and the needle walked caudad, until the lamina was cleared. The ligamentum flavum was engaged and the epidural space identified using "loss-of-resistance technique" with 2-3 ml of PF-NaCl (0.9% NSS), in a 5cc dedicated LOR syringe. (See Imaging guidance below for use of contrast details.) Once proper needle placement was secured, and negative aspiration confirmed, the solution was injected in intermittent fashion, asking for systemic symptoms every 0.5cc. The needles were then removed and the area cleansed, making sure to leave some of the prepping solution back to take advantage of its long term  bactericidal properties.  Vitals:   10/20/23 1044 10/20/23 1111 10/20/23 1116 10/20/23 1126  BP: 102/84 (!) 162/93 (!) 167/73 (!) 172/96  Pulse: 71     Resp: 18 16 18 18   Temp: 97.9 F (36.6 C)     SpO2: 100% 99% 97% 96%  Weight: 206 lb (93.4 kg)     Height: 5' (1.524 m)        End Time: 1122 hrs.  Imaging Guidance (Spinal):          Type of Imaging Technique: Fluoroscopy Guidance (Spinal) Indication(s): Fluoroscopy guidance for needle placement to enhance accuracy in procedures requiring precise needle localization for targeted delivery of medication in or near specific anatomical locations not easily accessible without such real-time imaging assistance. Exposure Time: Please see nurses notes. Contrast: Before injecting any contrast, we confirmed that the patient did not have an allergy to iodine, shellfish, or radiological contrast. Once satisfactory needle placement was completed at the desired level, radiological contrast was injected. Contrast injected under live fluoroscopy. No contrast complications. See chart for type and volume of contrast used. Fluoroscopic Guidance: I was personally present during the use of fluoroscopy. Tunnel Vision Technique used to obtain the best possible view of the target area. Parallax error corrected before commencing the procedure. Direction-depth-direction technique used to introduce the needle under continuous pulsed fluoroscopy. Once target was reached, antero-posterior, oblique, and lateral fluoroscopic projection used confirm needle placement in all planes. Images permanently stored in EMR. Interpretation: I personally interpreted the imaging intraoperatively. Adequate needle placement confirmed in multiple planes. Appropriate spread of contrast into desired area was observed. No evidence of afferent or efferent intravascular uptake. No intrathecal or subarachnoid spread observed. Permanent images saved into the patient's record.  Post-operative  Assessment:  Post-procedure Vital Signs:  Pulse/HCG Rate: 7171 Temp: 97.9 F (36.6 C) Resp: 18 BP: (!) 172/96 SpO2: 96 %  EBL: None  Complications: No immediate post-treatment complications observed by team, or reported by patient.  Note: The patient tolerated the entire procedure well. A repeat set of vitals were taken after the procedure and the patient was kept under observation following institutional policy, for this type of procedure. Post-procedural neurological assessment was performed, showing return to baseline, prior to discharge. The patient was provided with post-procedure discharge instructions, including a section on how to identify potential problems. Should any problems arise concerning this procedure, the patient was given instructions to immediately contact us , at any time, without hesitation.  In any case, we plan to contact the patient by telephone for a follow-up status report regarding this interventional procedure.  Comments:  No additional relevant information.  Plan of Care (POC)  Orders:  Orders Placed This Encounter  Procedures   DG PAIN CLINIC C-ARM 1-60 MIN NO REPORT    Intraoperative interpretation by procedural physician at [facility].    Standing Status:   Standing    Number of Occurrences:   1    Reason for exam::   Assistance in needle guidance and placement for procedures requiring needle placement in or near specific anatomical locations not easily accessible without such assistance.   Okay to restart Plavix  tomorrow  Medications ordered for procedure: Meds ordered this encounter  Medications   iohexol  (OMNIPAQUE ) 180 MG/ML injection 10 mL    Must be Myelogram-compatible. If not available, you may substitute with a water -soluble, non-ionic, hypoallergenic, myelogram-compatible radiological contrast medium.   sodium chloride  flush (NS) 0.9 % injection 1 mL   ropivacaine  (PF) 2 mg/mL (0.2%) (NAROPIN ) injection 1 mL   dexamethasone   (DECADRON ) injection 10 mg   Medications administered: We administered iohexol , sodium chloride  flush, ropivacaine  (PF) 2 mg/mL (0.2%), and dexamethasone .  See the medical record for exact dosing, route, and time of administration.    Follow-up plan:   Return in about 4 weeks (around 11/17/2023) for PPE, VV.     Recent Visits Date Type Provider Dept  09/14/23 Office Visit Marcelino Nurse, MD [facility]  08/11/23 Procedure visit Marcelino Nurse, MD [facility]  07/27/23 Office Visit Marcelino Nurse, MD [facility]  Showing recent visits within past 90 days and meeting all other requirements Today's Visits Date Type Provider Dept  10/20/23 Procedure visit Marcelino Nurse, MD [facility]  Showing today's visits and meeting all other requirements Future Appointments Date Type Provider Dept  11/18/23 Appointment Marcelino Nurse, MD [facility]  Showing future appointments within next 90 days and meeting all other requirements   Disposition: Discharge home  Discharge (Date  Time): 10/20/2023; 1130 hrs.   Primary Care Physician: Stephanie Charlene CROME, MD Location: [facility] Note by: Nurse Marcelino, MD (TTS technology used. I apologize for any typographical errors that were not detected and corrected.) Date: 10/20/2023; Time: 12:03 PM  Disclaimer:  Medicine is not an Visual merchandiser. The only guarantee in medicine is that nothing is guaranteed. It is important to note that the decision to proceed with this intervention was based on the information collected from the patient. The Data and conclusions were drawn from the patient's questionnaire, the interview, and the physical examination. Because the information was provided in large part by the patient, it cannot be guaranteed that it has not been purposely or unconsciously manipulated. Every effort has been made to obtain as much relevant data as possible for this evaluation.  It is important to note that the conclusions that lead to this procedure are derived in large part from the available data. Always take into account that the treatment will also be dependent on availability of resources and existing treatment guidelines, considered by other Pain Management Practitioners as being common knowledge and practice, at the time of the intervention. For Medico-Legal purposes, it is also important to point out that variation in procedural techniques and pharmacological choices are the acceptable norm. The indications, contraindications, technique, and results of the above procedure should only be interpreted and judged by a Board-Certified Interventional Pain Specialist with extensive familiarity and  expertise in the same exact procedure and technique.

## 2023-10-21 ENCOUNTER — Telehealth: Payer: Self-pay | Admitting: *Deleted

## 2023-10-21 NOTE — Telephone Encounter (Signed)
 Post procedure call; spoke with daughter, many questions answered re; procedure and f/up, medications etc.  All questions addressed.  If any concerns she will reach out.

## 2023-10-27 DIAGNOSIS — E875 Hyperkalemia: Secondary | ICD-10-CM | POA: Diagnosis not present

## 2023-11-18 ENCOUNTER — Ambulatory Visit
Attending: Student in an Organized Health Care Education/Training Program | Admitting: Student in an Organized Health Care Education/Training Program

## 2023-11-18 DIAGNOSIS — G894 Chronic pain syndrome: Secondary | ICD-10-CM

## 2023-11-18 DIAGNOSIS — M79605 Pain in left leg: Secondary | ICD-10-CM

## 2023-11-18 DIAGNOSIS — G5712 Meralgia paresthetica, left lower limb: Secondary | ICD-10-CM

## 2023-11-18 DIAGNOSIS — M5412 Radiculopathy, cervical region: Secondary | ICD-10-CM

## 2023-11-18 DIAGNOSIS — G5793 Unspecified mononeuropathy of bilateral lower limbs: Secondary | ICD-10-CM

## 2023-11-18 MED ORDER — PREDNISONE 20 MG PO TABS: ORAL_TABLET | ORAL | 0 refills | Status: AC

## 2023-11-18 MED ORDER — BUPRENORPHINE 10 MCG/HR TD PTWK: 1.0000 | MEDICATED_PATCH | TRANSDERMAL | 1 refills | Status: DC

## 2023-11-18 NOTE — Progress Notes (Signed)
 PROVIDER NOTE: Interpretation of information contained herein should be left to medically-trained personnel. Specific patient instructions are provided elsewhere under Patient Instructions section of medical record. This document was created in part using AI and STT-dictation technology, any transcriptional errors that may result from this process are unintentional.  Patient: Rebecca Gilmore  Service: E/M   PCP: Stephanie Charlene CROME, MD  DOB: Jul 08, 1940  DOS: 11/18/2023  Provider: Wallie Sherry, MD  MRN: [id number]  Delivery: Virtual Visit  Specialty: Interventional Pain Management  Type: Established Patient  Setting: Ambulatory outpatient facility  Specialty designation: 09  Referring Prov.: Hamrick, Charlene CROME, MD  Location: Remote location       Virtual Encounter - Pain Management PROVIDER NOTE: Information contained herein reflects review and annotations entered in association with encounter. Interpretation of such information and data should be left to medically-trained personnel. Information provided to patient can be located elsewhere in the medical record under Patient Instructions. Document created using STT-dictation technology, any transcriptional errors that may result from process are unintentional.    Contact & Pharmacy Preferred: [phone] Home: [phone] (home) Mobile: [phone] (mobile) E-mail: [email or web address]  [id number] [address] [place], [place] - [address] [facility] AT [facility] [address] [place] [address] Phone: [phone] Fax: [phone]   Pre-screening  Rebecca Gilmore offered in-person vs virtual encounter. She indicated preferring virtual for this encounter.   Reason COVID-19*  Social distancing based on CDC and AMA recommendations.   I contacted Rebecca Gilmore on 11/18/2023 via telephone.      I clearly identified myself as Wallie Sherry, MD. I verified that I was speaking with the correct person using two identifiers (Name: Rebecca Gilmore, and date of birth: [birth year]).  Consent I sought verbal advanced consent from Rebecca Gilmore for virtual visit interactions. I informed Rebecca Gilmore of possible security and privacy concerns, risks, and limitations associated with providing not-in-person medical evaluation and management services. I also informed Rebecca Gilmore of the availability of in-person appointments. Finally, I informed her that there would be a charge for the virtual visit and that she could be  personally, fully or partially, financially responsible for it. Rebecca Gilmore expressed understanding and agreed to proceed.   Historic Elements   Rebecca Gilmore is a 83 y.o. year old, female patient evaluated today after our last contact on 10/20/2023. Rebecca Gilmore  has a past medical history of Anemia, Arrhythmia, CHF (congestive heart failure) (HCC), Chronic kidney disease, Depression, HBP (high blood pressure), History of bladder problems, Memory loss, Muscle pain, Osteoporosis, Reflux, Sinus congestion, and Swelling. She also  has a past surgical history that includes Knee surgery (Right); EYELID SURGERY (Bilateral); CATARACT SURGERY; GROWTH ON FACE; Artery Biopsy (Bilateral, 12/01/2016); Thrombectomy femoral artery (Left, 11/16/2021); Esophagogastroduodenoscopy (egd) with propofol  (N/A, 12/12/2021); Colonoscopy with propofol  (N/A, 12/13/2021); Givens capsule study (N/A, 12/15/2021); Givens capsule study (N/A, 01/28/2022); Coronary/Graft Acute MI Revascularization (N/A, 08/05/2022); and LEFT HEART CATH AND CORONARY ANGIOGRAPHY (N/A, 08/05/2022). Rebecca Gilmore has a current medication list which includes the following prescription(s): acetaminophen , amitriptyline , buprenorphine , cholecalciferol , clopidogrel , cyanocobalamin , iron , lidocaine , loratadine , nystatin, pantoprazole , prednisone , pregabalin , and sertraline . She  reports that she quit smoking about 30 years ago. Her smoking use included cigarettes. She has quit using smokeless  tobacco.  Her smokeless tobacco use included snuff and chew. She reports that she does not drink alcohol and does not use drugs. Rebecca Gilmore is allergic to chlorhexidine , morphine , other, morphine  and codeine, kenalog  [triamcinolone  acetonide], penicillins, latex, and povidone-iodine.  BMI: Estimated body mass index is 40.23 kg/m as calculated from the following:   Height as of 10/20/23: 5' (1.524 m).   Weight as of 10/20/23: 206 lb (93.4 kg). Last encounter: 09/14/2023. Last procedure: 10/20/2023.  HPI  Today, she is being contacted for a post-procedure assessment.  Post-Procedure Evaluation   Type: Cervical Epidural Steroid injection (CESI) (Interlaminar) #1  Laterality: Left (-LT)  Level: C7-T1 DOS: 10/20/2023  Provider: Wallie Sherry, MD Imaging: Fluoroscopy-guided Spinal ([id number]) Anesthesia: Local anesthesia (1-2% Lidocaine )  Medical Necessity Purpose: Diagnostic/Therapeutic Rationale (medical necessity): procedure needed and proper for the diagnosis and/or treatment of Rebecca Gilmore's medical symptoms and needs. Indications: Cervicalgia, cervical radicular pain, degenerative disc disease, severe enough to impact quality of life or function. 1. Cervical radicular pain    NAS-11 Pain score:   Pre-procedure: 10-Worst pain ever/10   Post-procedure: 0-No pain/10     Effectiveness:  Initial hour after procedure: 0 % (daughter is unsure)  Subsequent 4-6 hours post-procedure: 0 %  Analgesia past initial 6 hours: 50 % (daughter states that she did get some relief in the right hand)  Ongoing improvement:  Analgesic:  50% Function: Somewhat improved ROM: Somewhat improved   Pharmacotherapy Assessment  Analgesic: Butrans  patch 7.5 mcg an hour, increase to 10 mcg an hour  Monitoring: [place] PMP: PDMP reviewed during this encounter.       Pharmacotherapy: No side-effects or adverse reactions reported. Compliance: No problems identified. Effectiveness: Clinically acceptable.  UDS: No  results found for: SUMMARY   Laboratory Chemistry Profile   Renal Lab Results  Component Value Date   BUN 21 12/28/2022   CREATININE 1.41 (H) 12/28/2022   GFRAA 37 (L) 01/23/2019   GFRNONAA 37 (L) 12/28/2022    Hepatic Lab Results  Component Value Date   AST 19 12/24/2022   ALT 10 12/24/2022   ALBUMIN 3.3 (L) 12/24/2022   ALKPHOS 61 12/24/2022    Electrolytes Lab Results  Component Value Date   NA 135 12/28/2022   K 3.9 12/28/2022   CL 104 12/28/2022   CALCIUM  8.3 (L) 12/28/2022   MG 1.7 12/28/2022   PHOS 2.9 12/28/2022    Bone No results found for: VD25OH, CI874NY7UNU, CI6874NY7, CI7874NY7, 25OHVITD1, 25OHVITD2, 25OHVITD3, TESTOFREE, TESTOSTERONE  Inflammation (CRP: Acute Phase) (ESR: Chronic Phase) Lab Results  Component Value Date   LATICACIDVEN 1.2 12/24/2022         Note: Above Lab results reviewed.    Assessment  The primary encounter diagnosis was Cervical radicular pain. Diagnoses of Meralgia paraesthetica, left, Left leg pain, Neuropathic pain, leg, bilateral, and Chronic pain syndrome were also pertinent to this visit.  Plan of Care    [name] has a current medication list which includes the following long-term medication(s): iron , loratadine , pregabalin , and sertraline .  -Increase Butrans  patch from 7.5--> 10 mcg/hr -Discussed options to help with constipation including the addition of daily Colace and also considering MiraLAX  if she goes more than 3 days without a bowel movement - Prednisone  taper as below - Limited response to cervical epidural steroid injection, continue to monitor symptoms consider repeating in the future  Pharmacotherapy (Medications Ordered): Meds ordered this encounter  Medications   buprenorphine  (BUTRANS ) 10 MCG/HR PTWK    Sig: Place 1 patch onto the skin once a week.    Dispense:  4 patch    Refill:  1    Chronic Pain: STOP Act (Not applicable) Fill 1 day early if closed on refill date.  Avoid benzodiazepines within  8 hours of opioids   predniSONE  (DELTASONE ) 20 MG tablet    Sig: Take 3 tablets (60 mg total) by mouth daily with breakfast for 3 days, THEN 2 tablets (40 mg total) daily with breakfast for 3 days, THEN 1 tablet (20 mg total) daily with breakfast for 3 days.    Dispense:  18 tablet    Refill:  0   Orders:  No orders of the defined types were placed in this encounter.  Follow-up plan:   Return in about 8 weeks (around 01/13/2024) for MM, F2F.      Recent Visits Date Type Provider Dept  10/20/23 Procedure visit Marcelino Nurse, MD [facility]  09/14/23 Office Visit Marcelino Nurse, MD [facility]  Showing recent visits within past 90 days and meeting all other requirements Today's Visits Date Type Provider Dept  11/18/23 Office Visit Marcelino Nurse, MD [facility]  Showing today's visits and meeting all other requirements Future Appointments No visits were found meeting these conditions. Showing future appointments within next 90 days and meeting all other requirements  I discussed the assessment and treatment plan with the patient. The patient was provided an opportunity to ask questions and all were answered. The patient agreed with the plan and demonstrated an understanding of the instructions.  Patient advised to call back or seek an in-person evaluation if the symptoms or condition worsens.  Duration of encounter: .  Note by: Nurse Marcelino, MD Date: 11/18/2023; Time: 3:49 PM

## 2023-11-25 ENCOUNTER — Telehealth: Payer: Self-pay | Admitting: Student in an Organized Health Care Education/Training Program

## 2023-11-25 NOTE — Telephone Encounter (Signed)
 Pharmacy is telling patient her insurance does not cover the 10mg  Buprenorphine  patches. Please check with pharmacy and find out what can be done. Notify patient's daughter

## 2023-11-26 NOTE — Telephone Encounter (Signed)
 Call daughter to let her know that we had to do a PA . Patch was increased from 7.5mcg to 10mcg

## 2023-12-07 ENCOUNTER — Telehealth: Payer: Self-pay | Admitting: Student in an Organized Health Care Education/Training Program

## 2023-12-07 NOTE — Telephone Encounter (Signed)
 Patient states she finished the prenisone and that is what is helping her pain. Since she finished it the pain and numbness is back. Patches are not helping . She wants to see if Dr Marcelino will put her back on the prenisone.

## 2023-12-08 NOTE — Telephone Encounter (Signed)
 Spoke with patient's daughter, informed her that Prednisone  cannot be prescribed at this time.

## 2023-12-16 DIAGNOSIS — I214 Non-ST elevation (NSTEMI) myocardial infarction: Secondary | ICD-10-CM | POA: Diagnosis not present

## 2024-01-06 ENCOUNTER — Inpatient Hospital Stay: Attending: Oncology

## 2024-01-06 DIAGNOSIS — R5383 Other fatigue: Secondary | ICD-10-CM | POA: Diagnosis present

## 2024-01-06 DIAGNOSIS — D631 Anemia in chronic kidney disease: Secondary | ICD-10-CM | POA: Insufficient documentation

## 2024-01-06 DIAGNOSIS — Z87891 Personal history of nicotine dependence: Secondary | ICD-10-CM | POA: Diagnosis not present

## 2024-01-06 DIAGNOSIS — N184 Chronic kidney disease, stage 4 (severe): Secondary | ICD-10-CM | POA: Diagnosis present

## 2024-01-06 LAB — CBC WITH DIFFERENTIAL (CANCER CENTER ONLY)
Abs Immature Granulocytes: 0.07 K/uL (ref 0.00–0.07)
Basophils Absolute: 0 K/uL (ref 0.0–0.1)
Basophils Relative: 1 %
Eosinophils Absolute: 0.1 K/uL (ref 0.0–0.5)
Eosinophils Relative: 2 %
HCT: 37.8 % (ref 36.0–46.0)
Hemoglobin: 12.1 g/dL (ref 12.0–15.0)
Immature Granulocytes: 1 %
Lymphocytes Relative: 32 %
Lymphs Abs: 2.1 K/uL (ref 0.7–4.0)
MCH: 30.3 pg (ref 26.0–34.0)
MCHC: 32 g/dL (ref 30.0–36.0)
MCV: 94.7 fL (ref 80.0–100.0)
Monocytes Absolute: 0.4 K/uL (ref 0.1–1.0)
Monocytes Relative: 6 %
Neutro Abs: 3.9 K/uL (ref 1.7–7.7)
Neutrophils Relative %: 58 %
Platelet Count: 159 K/uL (ref 150–400)
RBC: 3.99 MIL/uL (ref 3.87–5.11)
RDW: 13.2 % (ref 11.5–15.5)
WBC Count: 6.6 K/uL (ref 4.0–10.5)
nRBC: 0 % (ref 0.0–0.2)

## 2024-01-06 LAB — IRON AND TIBC
Iron: 69 ug/dL (ref 28–170)
Saturation Ratios: 24 % (ref 10.4–31.8)
TIBC: 294 ug/dL (ref 250–450)
UIBC: 225 ug/dL

## 2024-01-06 LAB — RETIC PANEL
Immature Retic Fract: 6.6 % (ref 2.3–15.9)
RBC.: 4.01 MIL/uL (ref 3.87–5.11)
Retic Count, Absolute: 52.1 K/uL (ref 19.0–186.0)
Retic Ct Pct: 1.3 % (ref 0.4–3.1)
Reticulocyte Hemoglobin: 34.8 pg (ref >27.9)

## 2024-01-06 LAB — FERRITIN: Ferritin: 225 ng/mL (ref 11–307)

## 2024-01-11 ENCOUNTER — Encounter: Payer: Self-pay | Admitting: Student in an Organized Health Care Education/Training Program

## 2024-01-11 ENCOUNTER — Ambulatory Visit
Attending: Student in an Organized Health Care Education/Training Program | Admitting: Student in an Organized Health Care Education/Training Program

## 2024-01-11 ENCOUNTER — Encounter: Payer: Self-pay | Admitting: Oncology

## 2024-01-11 ENCOUNTER — Inpatient Hospital Stay

## 2024-01-11 ENCOUNTER — Inpatient Hospital Stay: Attending: Oncology | Admitting: Oncology

## 2024-01-11 VITALS — BP 133/67 | HR 69 | Temp 97.0°F | Resp 18 | Wt 214.0 lb

## 2024-01-11 VITALS — BP 96/83 | HR 72 | Temp 97.3°F | Resp 16 | Ht 60.0 in | Wt 209.0 lb

## 2024-01-11 DIAGNOSIS — M79605 Pain in left leg: Secondary | ICD-10-CM | POA: Insufficient documentation

## 2024-01-11 DIAGNOSIS — M5412 Radiculopathy, cervical region: Secondary | ICD-10-CM | POA: Diagnosis not present

## 2024-01-11 DIAGNOSIS — D631 Anemia in chronic kidney disease: Secondary | ICD-10-CM | POA: Diagnosis not present

## 2024-01-11 DIAGNOSIS — G5793 Unspecified mononeuropathy of bilateral lower limbs: Secondary | ICD-10-CM | POA: Insufficient documentation

## 2024-01-11 DIAGNOSIS — N184 Chronic kidney disease, stage 4 (severe): Secondary | ICD-10-CM

## 2024-01-11 DIAGNOSIS — G894 Chronic pain syndrome: Secondary | ICD-10-CM | POA: Insufficient documentation

## 2024-01-11 MED ORDER — HYDROCODONE-ACETAMINOPHEN 5-325 MG PO TABS: 1.0000 | ORAL_TABLET | Freq: Every day | ORAL | 0 refills | Status: AC | PRN

## 2024-01-11 NOTE — Assessment & Plan Note (Signed)
 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 Previous Bone marrow biopsy results showed hypercellular marrow, no significant dysplastic features.  Cytogenetic normal. Labs are reviewed and discussed with patient. Lab Results  Component Value Date   HGB 12.1 01/06/2024   TIBC 294 01/06/2024   IRONPCTSAT 24 01/06/2024   FERRITIN 225 01/06/2024    Ferritin is at goal.  Hemoglobin > 10.  No need for  Retacrit  Continue oral iron  supplementation.

## 2024-01-11 NOTE — Progress Notes (Signed)
 Pt here for follow up. No new hematology concerns

## 2024-01-11 NOTE — Assessment & Plan Note (Signed)
 Encourage oral hydration and avoid nephrotoxins.

## 2024-01-11 NOTE — Progress Notes (Signed)
 PROVIDER NOTE: Interpretation of information contained herein should be left to medically-trained personnel. Specific patient instructions are provided elsewhere under Patient Instructions section of medical record. This document was created in part using AI and STT-dictation technology, any transcriptional errors that may result from this process are unintentional.  Patient: Rebecca Gilmore  Service: E/M   PCP: Stephanie Charlene CROME, MD  DOB: 04-09-1940  DOS: 01/11/2024  Provider: Wallie Sherry, MD  MRN: [id number]  Delivery: Face-to-face  Specialty: Interventional Pain Management  Type: Established Patient  Setting: Ambulatory outpatient facility  Specialty designation: 09  Referring Prov.: Hamrick, Charlene CROME, MD  Location: Outpatient office facility       History of present illness (HPI) [name], a 83 y.o. year old female, is here today because of her Cervical radicular pain [M54.12]. Ms. Sakurai primary complain today is Hip Pain (Left hip ), Knee Pain (Left ), and Other (Center of left buttocks. )  Pertinent problems: Ms. Diggins does not have any pertinent problems on file.  Pain Assessment: Severity of Chronic pain is reported as a 10-Worst pain ever (with movement the pain shoots up to 10/10)/10. Location: Hip Left/down left leg to the foot and is severe in lower outer aspect of shin area.. Onset: More than a month ago. Quality: Discomfort, Constant, Radiating, Throbbing, Sharp. Timing: Intermittent. Modifying factor(s): sitting, no activity or movement. Vitals:  height is 5' (1.524 m) and weight is 209 lb (94.8 kg). Her temporal temperature is 97.3 F (36.3 C) (abnormal). Her blood pressure is 96/83 and her pulse is 72. Her respiration is 16 and oxygen  saturation is 99%.  BMI: Estimated body mass index is 40.82 kg/m as calculated from the following:   Height as of this encounter: 5' (1.524 m).   Weight as of this encounter: 209 lb (94.8 kg).  Last encounter: 11/18/2023. Last  procedure: 10/20/2023.  Reason for encounter: medication management.   Discussed the use of AI scribe software for clinical note transcription with the patient, who gave verbal consent to proceed.  History of Present Illness   Rebecca Gilmore is an 83 year old female who presents with pain management concerns.  She has a history of chronic pain, previously managed with tramadol and hydrocodone. Currently, she is not on tramadol and is experiencing pain that is different from her past experiences.  She has a history of temporal arteritis, for which she was treated with prednisone . She started on a high dose of 60 mg daily and was gradually tapered down to a very low dose before discontinuation. She recalls the onset of her condition [place] which led to multiple hospital visits and initial misdiagnosis as migraines before the correct diagnosis was made through blood tests.       Pharmacotherapy Assessment   Butrans  patch not effective and expensive, transition to hydrocodone, low-dose Monitoring: [place] PMP: PDMP reviewed during this encounter.       Pharmacotherapy: No side-effects or adverse reactions reported. Compliance: No problems identified. Effectiveness: Clinically acceptable.  [name], RN  01/11/2024 11:13 AM  Sign when Signing Visit Nursing Pain Medication Assessment:  Safety precautions to be maintained throughout the outpatient stay will include: orient to surroundings, keep bed in low position, maintain call bell within reach at all times, provide assistance with transfer out of bed and ambulation.  Medication Inspection Compliance: Ms. Poplaski did not comply with our request to bring her pills to be counted. She was  reminded that bringing the medication bottles, even when empty, is a requirement.  Medication: None brought in. Pill/Patch Count: None available to be counted. Bottle Appearance: No container available. Did  not bring bottle(s) to appointment. Filled Date: N/A Last Medication intake:  did not pick up last Rx.    UDS:  No results found for: SUMMARY  No results found for: CBDTHCR No results found for: D8THCCBX No results found for: D9THCCBX  ROS  Constitutional: Denies any fever or chills Gastrointestinal: No reported hemesis, hematochezia, vomiting, or acute GI distress Musculoskeletal: Low back and bilateral leg painleft greater than right Neurological: No reported episodes of acute onset apraxia, aphasia, dysarthria, agnosia, amnesia, paralysis, loss of coordination, or loss of consciousness  Medication Review  HYDROcodone-acetaminophen , Iron , acetaminophen , amitriptyline , cholecalciferol , clopidogrel , lidocaine , loratadine , nitroGLYCERIN, nystatin, pantoprazole , pregabalin , rosuvastatin , sertraline , and torsemide   History Review  Allergy: Ms. Riendeau is allergic to chlorhexidine , morphine , other, morphine  and codeine, kenalog  [triamcinolone  acetonide], penicillins, latex, and povidone-iodine. Drug: Ms. Azzarello  reports no history of drug use. Alcohol:  reports no history of alcohol use. Tobacco:  reports that she quit smoking about 31 years ago. Her smoking use included cigarettes. She has quit using smokeless tobacco.  Her smokeless tobacco use included snuff and chew. Social: Ms. Huie  reports that she quit smoking about 31 years ago. Her smoking use included cigarettes. She has quit using smokeless tobacco.  Her smokeless tobacco use included snuff and chew. She reports that she does not drink alcohol and does not use drugs. Medical:  has a past medical history of Anemia, Arrhythmia, CHF (congestive heart failure) (HCC), Chronic kidney disease, Depression, HBP (high blood pressure), History of bladder problems, Memory loss, Muscle pain, Osteoporosis, Reflux, Sinus congestion, and Swelling. Surgical: Ms. Jersey  has a past surgical history that includes Knee surgery  (Right); EYELID SURGERY (Bilateral); CATARACT SURGERY; GROWTH ON FACE; Artery Biopsy (Bilateral, 12/01/2016); Thrombectomy femoral artery (Left, 11/16/2021); Esophagogastroduodenoscopy (egd) with propofol  (N/A, 12/12/2021); Colonoscopy with propofol  (N/A, 12/13/2021); Givens capsule study (N/A, 12/15/2021); Givens capsule study (N/A, 01/28/2022); Coronary/Graft Acute MI Revascularization (N/A, 08/05/2022); and LEFT HEART CATH AND CORONARY ANGIOGRAPHY (N/A, 08/05/2022). Family: family history includes AAA (abdominal aortic aneurysm) in her mother; Heart disease in her father and mother.  Laboratory Chemistry Profile   Renal Lab Results  Component Value Date   BUN 21 12/28/2022   CREATININE 1.41 (H) 12/28/2022   GFRAA 37 (L) 01/23/2019   GFRNONAA 37 (L) 12/28/2022    Hepatic Lab Results  Component Value Date   AST 19 12/24/2022   ALT 10 12/24/2022   ALBUMIN 3.3 (L) 12/24/2022   ALKPHOS 61 12/24/2022    Electrolytes Lab Results  Component Value Date   NA 135 12/28/2022   K 3.9 12/28/2022   CL 104 12/28/2022   CALCIUM  8.3 (L) 12/28/2022   MG 1.7 12/28/2022   PHOS 2.9 12/28/2022    Bone No results found for: VD25OH, CI874NY7UNU, CI6874NY7, CI7874NY7, 25OHVITD1, 25OHVITD2, 25OHVITD3, TESTOFREE, TESTOSTERONE  Inflammation (CRP: Acute Phase) (ESR: Chronic Phase) Lab Results  Component Value Date   LATICACIDVEN 1.2 12/24/2022         Note: Above Lab results reviewed.  Recent Imaging Review  DG PAIN CLINIC C-ARM 1-60 MIN NO REPORT Fluoro was used, but no Radiologist interpretation will be provided.  Please refer to NOTES tab for provider progress note. Note: Reviewed        Physical Exam  Vitals: BP 96/83 (BP Location: Left Arm, Patient Position: Sitting, Cuff Size: Large)  Pulse 72   Temp (!) 97.3 F (36.3 C) (Temporal)   Resp 16   Ht 5' (1.524 m)   Wt 209 lb (94.8 kg)   SpO2 99%   BMI 40.82 kg/m  BMI: Estimated body mass index is 40.82 kg/m as  calculated from the following:   Height as of this encounter: 5' (1.524 m).   Weight as of this encounter: 209 lb (94.8 kg). Ideal: Ideal body weight: 45.5 kg (100 lb 4.9 oz) Adjusted ideal body weight: 65.2 kg (143 lb 12.6 oz) General appearance: Well nourished, well developed, and well hydrated. In no apparent acute distress Mental status: Alert, oriented x 3 (person, place, & time)       Respiratory: No evidence of acute respiratory distress Eyes: PERLA Patient presents in wheelchair Left lateral leg pain from left hip to left lateral thigh extending past knee Paresthesias of bilateral feet  Assessment   Diagnosis  1. Cervical radicular pain   2. Left leg pain   3. Neuropathic pain, leg, bilateral   4. Chronic pain syndrome      Updated Problems: No problems updated.  Plan of Care  Problem-specific:  Assessment and Plan    Chronic Pain   Chronic pain previously managed with tramadol and hydrocodone now presents differently from past back-related issues. Hydrocodone, a stronger opioid than tramadol, will be trialed. Discussed potential constipation as a side effect; recommended monitoring bowel habits and considering senna as a stool softener. Prescribed hydrocodone 5 mg, one tablet daily as needed, with a maximum of two tablets per day. Provided a two-month prescription, thirty tablets per month. Follow-up after January to reassess pain management and adjust hydrocodone dosage based on usage.  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 Severe headaches led to diagnosis after blood tests. Prednisone  use acknowledged for GCA with long-term risks including effects on bones, heart, and blood sugar.  Follow-up   Schedule follow-up appointment after the new year to reassess pain management and hydrocodone dosage. Evaluate medication usage and adjust prescription accordingly. Collect urine sample  as part of controlled substance protocol.       Ms. Alyce Inscore has a current medication list which includes the following long-term medication(s): iron , loratadine , nitroglycerin, pregabalin , and sertraline .  Pharmacotherapy (Medications Ordered): Meds ordered this encounter  Medications   HYDROcodone-acetaminophen  (NORCO/VICODIN) 5-325 MG tablet    Sig: Take 1 tablet by mouth daily as needed for severe pain (pain score 7-10). Must last 30 days    Dispense:  30 tablet    Refill:  0    Chronic Pain: STOP Act (Not applicable) Fill 1 day early if closed on refill date. Avoid benzodiazepines within 8 hours of opioids   HYDROcodone-acetaminophen  (NORCO/VICODIN) 5-325 MG tablet    Sig: Take 1 tablet by mouth daily as needed for severe pain (pain score 7-10). Must last 30 days    Dispense:  30 tablet    Refill:  0    Chronic Pain: STOP Act (Not applicable) Fill 1 day early if closed on refill date. Avoid benzodiazepines within 8 hours of opioids   Orders:  Orders Placed This Encounter  Procedures   Compliance Drug Analysis, Ur    Volume: 30 ml(s). Minimum 3 ml of urine is needed. Document temperature of fresh sample. Indications: Long term (current) use of opiate analgesic (S20.108) Test#: [id number] (Comprehensive Profile)    Release to patient:   Immediate  Return in about 9 weeks (around 03/14/2024) for MM, F2F.    Recent Visits Date Type Provider Dept  11/18/23 Office Visit Marcelino Nurse, MD [facility]  10/20/23 Procedure visit Marcelino Nurse, MD [facility]  Showing recent visits within past 90 days and meeting all other requirements Today's Visits Date Type Provider Dept  01/11/24 Office Visit Marcelino Nurse, MD [facility]  Showing today's visits and meeting all other requirements Future Appointments Date Type Provider Dept  03/14/24 Appointment Marcelino Nurse, MD [facility]  Showing future appointments within next 90 days and  meeting all other requirements  I discussed the assessment and treatment plan with the patient. The patient was provided an opportunity to ask questions and all were answered. The patient agreed with the plan and demonstrated an understanding of the instructions.  Patient advised to call back or seek an in-person evaluation if the symptoms or condition worsens.  I personally spent a total of 30 minutes in the care of the patient today including preparing to see the patient, getting/reviewing separately obtained history, performing a medically appropriate exam/evaluation, counseling and educating, placing orders, and documenting clinical information in the EHR.   Note by: Nurse Marcelino, MD (TTS and AI technology used. I apologize for any typographical errors that were not detected and corrected.) Date: 01/11/2024; Time: 12:03 PM

## 2024-01-11 NOTE — Progress Notes (Signed)
 Hematology/Oncology Progress note [phone] [phone]      CHIEF COMPLAINTS/REASON FOR VISIT:  Anemia due to CKD  ASSESSMENT & PLAN:   Anemia of chronic kidney failure, stage 4 (severe) (HCC) Likely anemia secondary to chronic kidney disease. Previous Bone marrow biopsy results showed hypercellular marrow, no significant dysplastic features.  Cytogenetic normal. Labs are reviewed and discussed with patient. Lab Results  Component Value Date   HGB 12.1 01/06/2024   TIBC 294 01/06/2024   IRONPCTSAT 24 01/06/2024   FERRITIN 225 01/06/2024    Ferritin is at goal.  Hemoglobin > 10.  No need for  Retacrit  Continue oral iron  supplementation.   CKD (chronic kidney disease) stage 4, GFR 15-29 ml/min (HCC) Encourage oral hydration and avoid nephrotoxins.     Orders Placed This Encounter  Procedures   CBC with Differential (Cancer Center Only)    Standing Status:   Future    Expected Date:   01/10/2025    Expiration Date:   04/10/2025   Ferritin    Standing Status:   Future    Expected Date:   01/10/2025    Expiration Date:   04/10/2025   Iron  and TIBC    Standing Status:   Future    Expected Date:   01/10/2025    Expiration Date:   04/10/2025   Follow-up in 12 months  All questions were answered. The patient knows to call the clinic with any problems, questions or concerns.  Zelphia Cap, MD, PhD [facility] Hematology Oncology 01/11/2024    HISTORY OF PRESENTING ILLNESS:  Reviewed patient's recent labs that was done.  10/17/2018 labs revealed anemia with hemoglobin of 12.4 Free kappa light chain 5.35, lambda light chain 2.23, kappa lambda ratio 2.31. SPEP showed faint monoclonal component typed as IgG kappa.  Faint band suspicious for monoclonal components No aggravating or improving factors.  Associated signs and symptoms: Chronic fatigue.  No exacerbating or alleviating factors. . Patient has chronic kidney disease stage III follow-up with [facility]  nephrology Dr. 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  Has been on chronic steroids.She reports her current prednisone  regimen is 5 mg alternating with 2.5 mg. Chronic joint, neck, back pains.  hospitalized from 02/18/2022 - 02/20/2022 due to anemia.  Patient had extensive GI workup including EGD, colonoscopy, capsule study which were negative.  Patient was seen by Dr. Rennie during hospitalization.  Patient presented with a hemoglobin of 6.5, status post PRBC transfusion and 1 dose of Venofer  300 mg daily.   02/20/22 Bone marrow biopsy showed hypercellular marrow, no significant dysplastic.  Cytogenetic normal.  08/12/2022 Patient has aortic stenosis, status post TAVR and pacemaker placement   INTERVAL HISTORY Rebecca Gilmore is a 83 y.o. female who has above history reviewed by me today presents for follow up of anemia due to CKD No new complaints. She takes oral iron  supplementation.    Review of Systems  Constitutional:  Positive for fatigue. Negative for appetite change, chills and fever.  HENT:   Negative for hearing loss and voice change.   Eyes:  Negative for eye problems.  Respiratory:  Negative for chest tightness and cough.   Cardiovascular:  Negative for chest pain.  Gastrointestinal:  Negative for abdominal distention, abdominal pain and blood in stool.  Endocrine: Negative for hot flashes.  Genitourinary:  Negative for difficulty urinating and frequency.   Musculoskeletal:  Positive for arthralgias, back pain and neck pain.  Skin:  Negative for itching and rash.  Neurological:  Negative for extremity weakness.  Hematological:  Negative for adenopathy.  Psychiatric/Behavioral:  Negative for confusion.     MEDICAL HISTORY:  Past Medical History:  Diagnosis Date   Anemia    Arrhythmia    atrial fibrillation   CHF (congestive heart failure) (HCC)    Chronic kidney disease    Depression    HBP (high blood pressure)    History of bladder  problems    Memory loss    Muscle pain    Osteoporosis    Reflux    Sinus congestion    Swelling    B/L FEET AND LEGS    SURGICAL HISTORY: Past Surgical History:  Procedure Laterality Date   ARTERY BIOPSY Bilateral 12/01/2016   Procedure: BIOPSY TEMPORAL ARTERY;  Surgeon: Laurence Redell CROME, MD;  Location: [facility] OR;  Service: Vascular;  Laterality: Bilateral;   CATARACT SURGERY     COLONOSCOPY WITH PROPOFOL  N/A 12/13/2021   Procedure: COLONOSCOPY WITH PROPOFOL ;  Surgeon: Therisa Bi, MD;  Location: [facility] ENDOSCOPY;  Service: Gastroenterology;  Laterality: N/A;   CORONARY/GRAFT ACUTE MI REVASCULARIZATION N/A 08/05/2022   Procedure: Coronary/Graft Acute MI Revascularization;  Surgeon: Ammon Blunt, MD;  Location: [facility] INVASIVE CV LAB;  Service: Cardiovascular;  Laterality: N/A;   ESOPHAGOGASTRODUODENOSCOPY (EGD) WITH PROPOFOL  N/A 12/12/2021   Procedure: ESOPHAGOGASTRODUODENOSCOPY (EGD) WITH PROPOFOL ;  Surgeon: Onita Elspeth Sharper, DO;  Location: [facility] ENDOSCOPY;  Service: Gastroenterology;  Laterality: N/A;   EYELID SURGERY Bilateral    GIVENS CAPSULE STUDY N/A 12/15/2021   Procedure: GIVENS CAPSULE STUDY;  Surgeon: Onita Elspeth Sharper, DO;  Location: [facility] ENDOSCOPY;  Service: Gastroenterology;  Laterality: N/A;   GIVENS CAPSULE STUDY N/A 01/28/2022   Procedure: GIVENS CAPSULE STUDY;  Surgeon: Unk Corinn Skiff, MD;  Location[facility] ENDOSCOPY;  Service: Gastroenterology;  Laterality: N/A;   GROWTH ON FACE     KNEE SURGERY Right    LEFT HEART CATH AND CORONARY ANGIOGRAPHY N/A 08/05/2022   Procedure: LEFT HEART CATH AND CORONARY ANGIOGRAPHY;  Surgeon: Ammon Blunt, MD;  Location: [facility] INVASIVE CV LAB;  Service: Cardiovascular;  Laterality: N/A;   THROMBECTOMY FEMORAL ARTERY Left 11/16/2021   Procedure: THROMBECTOMY FEMORAL ARTERY;  Surgeon: Laurence Redell CROME, MD;  Location: [facility] ORS;  Service: Vascular;  Laterality: Left;    SOCIAL HISTORY: Social History   Socioeconomic History    Marital status: Married    Spouse name: Not on file   Number of children: Not on file   Years of education: Not on file   Highest education level: Not on file  Occupational History   Not on file  Tobacco Use   Smoking status: Former    Current packs/day: 0.00    Types: Cigarettes    Quit date: 01/12/1993    Years since quitting: 31.0   Smokeless tobacco: Former    Types: Snuff, Chew   Tobacco comments:    DATE QUIT UNKNOWN  Vaping Use   Vaping status: Never Used  Substance and Sexual Activity   Alcohol use: No   Drug use: No   Sexual activity: Not Currently  Other Topics Concern   Not on file  Social History Narrative   Not on file   Social Drivers of Health   Financial Resource Strain: Low Risk  (03/07/2021)   Received from [facility]   Overall Financial Resource Strain (CARDIA)    Difficulty of Paying Living Expenses: Not hard at all  Food Insecurity: No Food Insecurity (01/08/2023)   Hunger Vital Sign  Worried About Programme Researcher, Broadcasting/film/video in the Last Year: Never true    Ran Out of Food in the Last Year: Never true  Transportation Needs: No Transportation Needs (01/08/2023)   PRAPARE - Administrator, Civil Service (Medical): No    Lack of Transportation (Non-Medical): No  Physical Activity: Not on file  Stress: Not on file  Social Connections: Not on file  Intimate Partner Violence: Not At Risk (12/24/2022)   Humiliation, Afraid, Rape, and Kick questionnaire    Fear of Current or Ex-Partner: No    Emotionally Abused: No    Physically Abused: No    Sexually Abused: No    FAMILY HISTORY: Family History  Problem Relation Age of Onset   Heart disease Mother    AAA (abdominal aortic aneurysm) Mother    Heart disease Father     ALLERGIES:  is allergic to chlorhexidine , morphine , other, morphine  and codeine, kenalog  [triamcinolone  acetonide], penicillins, latex, and povidone-iodine.  MEDICATIONS:  Current Outpatient Medications  Medication Sig  Dispense Refill   acetaminophen  (TYLENOL ) 325 MG tablet Take 2 tablets (650 mg total) by mouth every 6 (six) hours as needed for mild pain or fever.     amitriptyline  (ELAVIL ) 25 MG tablet Take 25 mg by mouth at bedtime as needed.     cholecalciferol  (VITAMIN D3) 25 MCG (1000 UT) tablet Take 1,000 Units by mouth daily.     clopidogrel  (PLAVIX ) 75 MG tablet TAKE 1 TABLET BY MOUTH EVERY DAY 90 tablet 2   Ferrous Sulfate  (IRON ) 325 (65 Fe) MG TABS Take 1 tablet by mouth daily.     HYDROcodone-acetaminophen  (NORCO/VICODIN) 5-325 MG tablet Take 1 tablet by mouth daily as needed for severe pain (pain score 7-10). Must last 30 days 30 tablet 0   lidocaine  (LIDODERM ) 5 % Place 1 patch onto the skin daily. Remove & Discard patch within 12 hours or as directed by MD 30 patch 0   loratadine  (CLARITIN ) 10 MG tablet Take 10 mg by mouth daily as needed.     nitroGLYCERIN (NITROSTAT) 0.4 MG SL tablet Place 0.4 mg under the tongue every 5 (five) minutes as needed for chest pain.     nystatin (MYCOSTATIN) 100000 UNIT/ML suspension Take by mouth.     pantoprazole  (PROTONIX ) 40 MG tablet Take 40 mg by mouth at bedtime.     pregabalin  (LYRICA ) 75 MG capsule Take 1 capsule (75 mg total) by mouth daily. 30 capsule 0   rosuvastatin  (CRESTOR ) 5 MG tablet Take 5 mg by mouth at bedtime.     sertraline  (ZOLOFT ) 50 MG tablet Take 50 mg by mouth daily.  0   torsemide  (DEMADEX ) 20 MG tablet Take 20 mg by mouth daily.     [START ON 02/10/2024] HYDROcodone-acetaminophen  (NORCO/VICODIN) 5-325 MG tablet Take 1 tablet by mouth daily as needed for severe pain (pain score 7-10). Must last 30 days (Patient not taking: Reported on 01/11/2024) 30 tablet 0   No current facility-administered medications for this visit.     PHYSICAL EXAMINATION: ECOG PERFORMANCE STATUS: 1 - Symptomatic but completely ambulatory Vitals:   01/11/24 1309 01/11/24 1315  BP: (!) 148/53 133/67  Pulse: 69   Resp: 18   Temp: (!) 97 F (36.1 C)    Filed  Weights   01/11/24 1309  Weight: 214 lb (97.1 kg)    Physical Exam Constitutional:      General: She is not in acute distress.    Appearance: She is obese.  HENT:  Head: Normocephalic and atraumatic.  Eyes:     General: No scleral icterus.    Pupils: Pupils are equal, round, and reactive to light.  Cardiovascular:     Rate and Rhythm: Normal rate.     Heart sounds: Murmur heard.  Pulmonary:     Effort: Pulmonary effort is normal. No respiratory distress.  Abdominal:     General: Bowel sounds are normal. There is no distension.     Palpations: Abdomen is soft.  Musculoskeletal:        General: No deformity. Normal range of motion.     Cervical back: Normal range of motion.     Right lower leg: Edema present.     Left lower leg: Edema present.  Skin:    General: Skin is warm and dry.     Findings: No erythema or rash.  Neurological:     Mental Status: She is alert and oriented to person, place, and time. Mental status is at baseline.  Psychiatric:        Thought Content: Thought content normal.      LABORATORY DATA:  I have reviewed the data as listed     Latest Ref Rng & Units 01/06/2024    1:14 PM 07/08/2023    1:34 PM 04/08/2023    1:06 PM  CBC  WBC 4.0 - 10.5 K/uL 6.6  5.9  5.3   Hemoglobin 12.0 - 15.0 g/dL 87.8  88.7  89.0   Hematocrit 36.0 - 46.0 % 37.8  35.0  34.4   Platelets 150 - 400 K/uL 159  161  163       Latest Ref Rng & Units 12/28/2022    4:11 AM 12/27/2022    4:15 AM 12/26/2022    8:16 AM  CMP  Glucose 70 - 99 mg/dL 95  90  87   BUN 8 - 23 mg/dL 21  27  32   Creatinine 0.44 - 1.00 mg/dL 8.58  8.39  8.01   Sodium 135 - 145 mmol/L 135  134  134   Potassium 3.5 - 5.1 mmol/L 3.9  3.9  3.7   Chloride 98 - 111 mmol/L 104  103  104   CO2 22 - 32 mmol/L 24  24  21    Calcium  8.9 - 10.3 mg/dL 8.3  7.9  8.0     Iron /TIBC/Ferritin/ %Sat    Component Value Date/Time   IRON  69 01/06/2024 1314   TIBC 294 01/06/2024 1314   FERRITIN 225 01/06/2024  1314   IRONPCTSAT 24 01/06/2024 1314

## 2024-01-11 NOTE — Progress Notes (Signed)
 Nursing Pain Medication Assessment:  Safety precautions to be maintained throughout the outpatient stay will include: orient to surroundings, keep bed in low position, maintain call bell within reach at all times, provide assistance with transfer out of bed and ambulation.  Medication Inspection Compliance: Ms. Tomei did not comply with our request to bring her pills to be counted. She was reminded that bringing the medication bottles, even when empty, is a requirement.  Medication: None brought in. Pill/Patch Count: None available to be counted. Bottle Appearance: No container available. Did not bring bottle(s) to appointment. Filled Date: N/A Last Medication intake:  did not pick up last Rx.

## 2024-01-14 LAB — COMPLIANCE DRUG ANALYSIS, UR

## 2024-02-10 ENCOUNTER — Telehealth: Payer: Self-pay | Admitting: Student in an Organized Health Care Education/Training Program

## 2024-02-10 NOTE — Telephone Encounter (Signed)
 Spoke with patient's daughter, advised her to schedule an appt to discuss options.

## 2024-02-10 NOTE — Telephone Encounter (Signed)
 Pt is not getting any relief from the hydrocodone . Is there anything else that could be prescribed.

## 2024-03-14 ENCOUNTER — Encounter: Payer: Self-pay | Admitting: Student in an Organized Health Care Education/Training Program

## 2024-03-14 ENCOUNTER — Ambulatory Visit: Admitting: Student in an Organized Health Care Education/Training Program

## 2024-03-14 VITALS — BP 104/45 | HR 70 | Temp 97.3°F | Resp 16 | Ht 61.0 in | Wt 209.0 lb

## 2024-03-14 DIAGNOSIS — M461 Sacroiliitis, not elsewhere classified: Secondary | ICD-10-CM | POA: Insufficient documentation

## 2024-03-14 DIAGNOSIS — G8929 Other chronic pain: Secondary | ICD-10-CM | POA: Diagnosis present

## 2024-03-14 DIAGNOSIS — G5712 Meralgia paresthetica, left lower limb: Secondary | ICD-10-CM | POA: Diagnosis present

## 2024-03-14 DIAGNOSIS — M5412 Radiculopathy, cervical region: Secondary | ICD-10-CM | POA: Diagnosis present

## 2024-03-14 DIAGNOSIS — M79605 Pain in left leg: Secondary | ICD-10-CM | POA: Insufficient documentation

## 2024-03-14 DIAGNOSIS — M533 Sacrococcygeal disorders, not elsewhere classified: Secondary | ICD-10-CM | POA: Diagnosis present

## 2024-03-14 DIAGNOSIS — G894 Chronic pain syndrome: Secondary | ICD-10-CM | POA: Insufficient documentation

## 2024-03-14 DIAGNOSIS — G5793 Unspecified mononeuropathy of bilateral lower limbs: Secondary | ICD-10-CM | POA: Insufficient documentation

## 2024-03-14 DIAGNOSIS — M542 Cervicalgia: Secondary | ICD-10-CM | POA: Diagnosis not present

## 2024-03-14 MED ORDER — HYDROCODONE-ACETAMINOPHEN 5-325 MG PO TABS: 1.0000 | ORAL_TABLET | Freq: Two times a day (BID) | ORAL | 0 refills | Status: AC | PRN

## 2024-03-14 NOTE — Progress Notes (Signed)
 PROVIDER NOTE: Interpretation of information contained herein should be left to medically-trained personnel. Specific patient instructions are provided elsewhere under Patient Instructions section of medical record. This document was created in part using AI and STT-dictation technology, any transcriptional errors that may result from this process are unintentional.  Patient: Rebecca Gilmore  Service: E/M   PCP: Rebecca Charlene CROME, MD  DOB: 1940-05-17  DOS: 03/14/2024  Provider: Wallie Sherry, MD  MRN: [id number]  Delivery: Face-to-face  Specialty: Interventional Pain Management  Type: Established Patient  Setting: Ambulatory outpatient facility  Specialty designation: 09  Referring Prov.: Hamrick, Charlene CROME, MD  Location: Outpatient office facility       History of present illness (HPI) [name], a 84 y.o. 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 Rebecca Gilmore primary complain today is Hip Pain (Left ), Back Pain (Cervical, thoracic and lumbar ), Hand Pain (Bilateral ), Knee Pain (Bilateral ), and Wrist Pain (Right )  Pertinent problems:   Pain Assessment: Severity of Chronic pain is reported as a 10-Worst pain ever/10. Location: Hip (see visit info for all pain sites) Left/left hip pain down leg to just about the knee. Onset: More than a month ago. Quality: Discomfort, Nagging, Aching. Timing: Intermittent. Modifying factor(s): positioning helps, medications. Vitals:  height is 5' 1 (1.549 m) and weight is 209 lb (94.8 kg). Her temporal temperature is 97.3 F (36.3 C) (abnormal). Her blood pressure is 104/45 (abnormal) and her pulse is 70. Her respiration is 16 and oxygen  saturation is 97%.  BMI: Estimated body mass index is 39.49 kg/m as calculated from the following:   Height as of this encounter: 5' 1 (1.549 m).   Weight as of this encounter: 209 lb (94.8 kg).  Last encounter: 01/11/2024. Last procedure: 10/20/2023.  Reason for  encounter:  Discussed the use of AI scribe software for clinical note transcription with the patient, who gave verbal consent to proceed.  History of Present Illness   Rebecca Gilmore is an 84 year old female with chronic pain who presents for pain management.  She experiences persistent pain described as 'pain, pain, miserable.' The pain radiates down her left leg and is characterized by popping and cracking with movement. She describes severe pain as a knife cutting from her back down to her leg. She has undergone physical therapy without benefit and cannot lift her leg.  She has a history of spinal injections, including nerve blocks-which includes a left lateral femoral cutaneous nerve block done 08/11/2023, which provided some relief. After a previous nerve block, she felt well enough to go shopping. She has not had a nerve ablation.  She experiences pain in her buttocks, feeling as if she is sitting on a rubber ball, and has a history of a broken tailbone from a fall during pregnancy. She cannot sit on hard surfaces without aggravating the pain.  She was previously prescribed hydrocodone , which she took once daily in the morning, but did not experience any relief. She has run out of the medication and did not receive a refill. She does not like medications that make her sleepy and reports staying up half the night due to her husband's movements.  She has a history of giant cell arteritis and was on high-dose prednisone , which masked some symptoms. Since tapering off prednisone , she has experienced the emergence of new pain symptoms.      Medication management: Started hydrocodone  last visit 5 mg daily as  needed.  Does not endorse any significant benefit.  No side effects noted.  Increase dose to 5 mg twice daily as needed.  Patient can also take 10 mg daily depending upon pain severity.  Has tried multiple nonopioid analgesics has also tried Butrans  patch with limited response  Rebecca Chrissie MATSU, RN  03/14/2024 11:45 AM  Sign when Signing Visit Nursing Pain Medication Assessment:  Safety precautions to be maintained throughout the outpatient stay will include: orient to surroundings, keep bed in low position, maintain call bell within reach at all times, provide assistance with transfer out of bed and ambulation.  Medication Inspection Compliance: Rebecca Gilmore did not comply with our request to bring her pills to be counted. She was reminded that bringing the medication bottles, even when empty, is a requirement.  Medication: None brought in. Pill/Patch Count: None available to be counted. Bottle Appearance: No container available. Did not bring bottle(s) to appointment. Filled Date: N/A Last Medication intake:  Ran out of medicine more than 48 hours ago    UDS:  Summary  Date Value Ref Range Status  01/11/2024 FINAL  Final    Comment:    ==================================================================== Compliance Drug Analysis, Ur ==================================================================== Test                             Result       Flag       Units  Drug Present and Declared for Prescription Verification   Pregabalin                      PRESENT      EXPECTED   Amitriptyline                   PRESENT      EXPECTED   Nortriptyline                  PRESENT      EXPECTED    Nortriptyline is an expected metabolite of amitriptyline .    Sertraline                      PRESENT      EXPECTED   Desmethylsertraline            PRESENT      EXPECTED    Desmethylsertraline is an expected metabolite of sertraline .    Acetaminophen                   PRESENT      EXPECTED   Lidocaine                       PRESENT      EXPECTED  Drug Present not Declared for Prescription Verification   Naproxen                       PRESENT      UNEXPECTED  Drug Absent but Declared for Prescription Verification   Hydrocodone                     Not Detected UNEXPECTED ng/mg  creat ==================================================================== Test                      Result    Flag   Units      Ref Range   Creatinine  89               mg/dL      >=79 ==================================================================== Declared Medications:  The flagging and interpretation on this report are based on the  following declared medications.  Unexpected results may arise from  inaccuracies in the declared medications.   **Note: The testing scope of this panel includes these medications:   Amitriptyline  (Elavil )  Hydrocodone  (Norco)  Pregabalin  (Lyrica )  Sertraline  (Zoloft )   **Note: The testing scope of this panel does not include small to  moderate amounts of these reported medications:   Acetaminophen  (Tylenol )  Acetaminophen  (Norco)  Topical Lidocaine  (Lidoderm )   **Note: The testing scope of this panel does not include the  following reported medications:   Clopidogrel  (Plavix )  Iron   Loratadine  (Claritin )  Nitroglycerin (Nitrostat)  Nystatin (Mycostatin)  Pantoprazole  (Protonix )  Rosuvastatin  (Crestor )  Torsemide  (Demadex )  Vitamin D3 ==================================================================== For clinical consultation, please call [phone]. ====================================================================     No results found for: CBDTHCR No results found for: D8THCCBX No results found for: D9THCCBX  ROS  Constitutional: Denies any fever or chills Gastrointestinal: No reported hemesis, hematochezia, vomiting, or acute GI distress Musculoskeletal: Left SI joint pain left lateral thigh pain Neurological: No reported episodes of acute onset apraxia, aphasia, dysarthria, agnosia, amnesia, paralysis, loss of coordination, or loss of consciousness  Medication Review  HYDROcodone -acetaminophen , Iron , acetaminophen , amitriptyline , cholecalciferol , clopidogrel , lidocaine , loratadine , nitroGLYCERIN,  nystatin, pantoprazole , pregabalin , rosuvastatin , sertraline , and torsemide   History Review  Allergy: Ms. Degeorge is allergic to chlorhexidine , morphine , other, morphine  and codeine, kenalog  [triamcinolone  acetonide], penicillins, latex, and povidone-iodine. Drug: Ms. Gosser  reports no history of drug use. Alcohol:  reports no history of alcohol use. Tobacco:  reports that she quit smoking about 31 years ago. Her smoking use included cigarettes. She has quit using smokeless tobacco.  Her smokeless tobacco use included snuff and chew. Social: Ms. Abad  reports that she quit smoking about 31 years ago. Her smoking use included cigarettes. She has quit using smokeless tobacco.  Her smokeless tobacco use included snuff and chew. She reports that she does not drink alcohol and does not use drugs. Medical:  has a past medical history of Anemia, Arrhythmia, CHF (congestive heart failure) (HCC), Chronic kidney disease, Depression, HBP (high blood pressure), History of bladder problems, Memory loss, Muscle pain, Osteoporosis, Reflux, Sinus congestion, and Swelling. Surgical: Ms. Jaso  has a past surgical history that includes Knee surgery (Right); EYELID SURGERY (Bilateral); CATARACT SURGERY; GROWTH ON FACE; Artery Biopsy (Bilateral, 12/01/2016); Thrombectomy femoral artery (Left, 11/16/2021); Esophagogastroduodenoscopy (egd) with propofol  (N/A, 12/12/2021); Colonoscopy with propofol  (N/A, 12/13/2021); Givens capsule study (N/A, 12/15/2021); Givens capsule study (N/A, 01/28/2022); Coronary/Graft Acute MI Revascularization (N/A, 08/05/2022); and LEFT HEART CATH AND CORONARY ANGIOGRAPHY (N/A, 08/05/2022). Family: family history includes AAA (abdominal aortic aneurysm) in her mother; Heart disease in her father and mother.  Laboratory Chemistry Profile   Renal Lab Results  Component Value Date   BUN 21 12/28/2022   CREATININE 1.41 (H) 12/28/2022   GFRAA 37 (L) 01/23/2019   GFRNONAA 37 (L) 12/28/2022     Hepatic Lab Results  Component Value Date   AST 19 12/24/2022   ALT 10 12/24/2022   ALBUMIN 3.3 (L) 12/24/2022   ALKPHOS 61 12/24/2022    Electrolytes Lab Results  Component Value Date   NA 135 12/28/2022   K 3.9 12/28/2022   CL 104 12/28/2022   CALCIUM  8.3 (L) 12/28/2022   MG 1.7 12/28/2022   PHOS 2.9 12/28/2022  Bone No results found for: VD25OH, [id number], [id number], [id number], 25OHVITD1, 25OHVITD2, 25OHVITD3, TESTOFREE, TESTOSTERONE  Inflammation (CRP: Acute Phase) (ESR: Chronic Phase) Lab Results  Component Value Date   LATICACIDVEN 1.2 12/24/2022         Note: Above Lab results reviewed.  Recent Imaging Review  DG PAIN CLINIC C-ARM 1-60 MIN NO REPORT Fluoro was used, but no Radiologist interpretation will be provided.  Please refer to NOTES tab for provider progress note. Note: Reviewed        Physical Exam  Vitals: BP (!) 104/45 (BP Location: Left Arm, Patient Position: Sitting, Cuff Size: Large)   Pulse 70   Temp (!) 97.3 F (36.3 C) (Temporal)   Resp 16   Ht 5' 1 (1.549 m)   Wt 209 lb (94.8 kg)   SpO2 97%   BMI 39.49 kg/m  BMI: Estimated body mass index is 39.49 kg/m as calculated from the following:   Height as of this encounter: 5' 1 (1.549 m).   Weight as of this encounter: 209 lb (94.8 kg). Ideal: Ideal body weight: 47.8 kg (105 lb 6.1 oz) Adjusted ideal body weight: 66.6 kg (146 lb 13.2 oz) General appearance: Well nourished, well developed, and well hydrated. In no apparent acute distress Mental status: Alert, oriented x 3 (person, place, & time)       Respiratory: No evidence of acute respiratory distress Eyes: PERLA Lumbar Spine Area Exam  Skin & Axial Inspection: No masses, redness, or swelling Alignment: Symmetrical Functional ROM: Unrestricted ROM       Stability: No instability detected Muscle Tone/Strength: Functionally intact. No obvious neuro-muscular anomalies detected. Sensory (Neurological):  Unimpaired Palpation: No palpable anomalies       Provocative Tests:  Patrick's Maneuver: (+) for left-sided S-I arthralgia             FABER* test: (+) for left-sided S-I arthralgia             S-I anterior distraction/compression test: (+) Left-sided S-I arthralgia/arthropathy S-I lateral compression test: (+)   S-I arthralgia/arthropathy S-I Thigh-thrust test: (+)   S-I arthralgia/arthropathy  *(Flexion, ABduction and External Rotation)  Assessment   Diagnosis Status  1. Chronic left SI joint pain   2. SI joint arthritis   3. SI (sacroiliac) joint inflammation   4. Meralgia paraesthetica, left   5. Cervical radicular pain   6. Left leg pain   7. Neuropathic pain, leg, bilateral   8. Chronic pain syndrome    Having a Flare-up Having a Flare-up Having a Flare-up   Updated Problems: No problems updated.  Plan of Care  The patient is an 84 year old female seen in follow-up for persistent neuropathic pain consistent with meralgia paresthetica as well as new/worsening left buttock pain. She previously underwent a left lateral femoral cutaneous nerve block in June 2025 with meaningful relief and is interested in repeating the procedure; given her prior response and ongoing symptoms, this is reasonable and we will plan to repeat the left LFCN block. In addition, her current buttock pain localizes to the posterior superior iliac region and reproduces with provocative maneuvers, with exam findings consistent with left sacroiliac (SI) joint dysfunction, along with tenderness over the piriformis muscle. We discussed treatment options including a left SI joint injection and a left piriformis muscle injection to address both potential pain generators, reviewing expected benefits, alternatives, and risks (bleeding, infection, transient soreness, lack of relief, rare nerve injury). She is agreeable to proceed, and we will schedule sequentially as clinically appropriate. She  was advised to continue  gentle stretching, activity modification, and notify the clinic sooner if pain acutely worsens, new weakness develops, or bowel/bladder symptoms occur. Follow-up will occur after the procedures to reassess pain and function.   Pharmacotherapy (Medications Ordered): Meds ordered this encounter  Medications   HYDROcodone -acetaminophen  (NORCO/VICODIN) 5-325 MG tablet    Sig: Take 1 tablet by mouth every 12 (twelve) hours as needed for severe pain (pain score 7-10). Must last 30 days    Dispense:  60 tablet    Refill:  0    Chronic Pain: STOP Act (Not applicable) Fill 1 day early if closed on refill date. Avoid benzodiazepines within 8 hours of opioids   HYDROcodone -acetaminophen  (NORCO/VICODIN) 5-325 MG tablet    Sig: Take 1 tablet by mouth every 12 (twelve) hours as needed for severe pain (pain score 7-10). Must last 30 days    Dispense:  60 tablet    Refill:  0    Chronic Pain: STOP Act (Not applicable) Fill 1 day early if closed on refill date. Avoid benzodiazepines within 8 hours of opioids   Orders:  Orders Placed This Encounter  Procedures   FEMORAL NERVE BLOCK    Standing Status:   Future    Expiration Date:   06/12/2024    Scheduling Instructions:     Side: Left-sided     Femoral nerve block    Where will this procedure be performed?:   [facility]   SACROILIAC JOINT INJECTION    Physical Examination Findings: Positive Sacral Thrust (Sacral Spring, Downward Pressure): (Y) Positive FABER maneuver (Patrick's): (Y) Positive SI distraction (Gapping): (Y) Positive SI compression (Approximation): (Y) Positive Thigh Thrust:  (Y) Positive Gaenslen's: (Y) Positive Sacral Sulcus Tenderness: (Y)    Standing Status:   Future    Expected Date:   03/28/2024    Expiration Date:   03/14/2025    Scheduling Instructions:     Procedure: Sacroiliac Joint Injection     Side  Laterality: Left      choice.     Timeframe: As soon as schedule allows.    Where will this procedure be  performed?:   [facility]    Return in about 1 day (around 03/15/2024) for Left LFCN and Left SIJ and Piriformis  with ultrasound guidance and fluoro.    Recent Visits Date Type Provider Dept  01/11/24 Office Visit Marcelino Nurse, MD [facility]  Showing recent visits within past 90 days and meeting all other requirements Today's Visits Date Type Provider Dept  03/14/24 Office Visit Marcelino Nurse, MD [facility]  Showing today's visits and meeting all other requirements Future Appointments Date Type Provider Dept  03/22/24 Appointment Marcelino Nurse, MD [facility]  Showing future appointments within next 90 days and meeting all other requirements  I discussed the assessment and treatment plan with the patient. The patient was provided an opportunity to ask questions and all were answered. The patient agreed with the plan and demonstrated an understanding of the instructions.  Patient advised to call back or seek an in-person evaluation if the symptoms or condition worsens.  I personally spent a total of 30 minutes in the care of the patient today including preparing to see the patient, getting/reviewing separately obtained history, performing a medically appropriate exam/evaluation, counseling and educating, placing orders, and documenting clinical information in the EHR.   Note by: Nurse Marcelino, MD (TTS and AI technology used. I apologize for any typographical errors that were not  detected and corrected.) Date: 03/14/2024; Time: 1:15 PM

## 2024-03-14 NOTE — Patient Instructions (Addendum)
 Continue Plavix     ______________________________________________________________________    General Risks and Possible Complications  Patient Responsibilities: It is important that you read this as it is part of your informed consent. It is our duty to inform you of the risks and possible complications associated with treatments offered to you. It is your responsibility as a patient to read this and to ask questions about anything that is not clear or that you believe was not covered in this document.  Patients Rights: You have the right to refuse treatment. You also have the right to change your mind, even after initially having agreed to have the treatment done. However, under this last option, if you wait until the last second to change your mind, you may be charged for the materials used up to that point.  Introduction: Medicine is not an visual merchandiser. Everything in Medicine, including the lack of treatment(s), carries the potential for danger, harm, or loss (which is by definition: Risk). In Medicine, a complication is a secondary problem, condition, or disease that can aggravate an already existing one. All treatments carry the risk of possible complications. The fact that a side effects or complications occurs, does not imply that the treatment was conducted incorrectly. It must be clearly understood that these can happen even when everything is done following the highest safety standards.  No treatment: You can choose not to proceed with the proposed treatment alternative. The PRO(s) would include: avoiding the risk of complications associated with the therapy. The CON(s) would include: not getting any of the treatment benefits. These benefits fall under one of three categories: diagnostic; therapeutic; and/or palliative. Diagnostic benefits include: getting information which can ultimately lead to improvement of the disease or symptom(s). Therapeutic benefits are those associated with  the successful treatment of the disease. Finally, palliative benefits are those related to the decrease of the primary symptoms, without necessarily curing the condition (example: decreasing the pain from a flare-up of a chronic condition, such as incurable terminal cancer).  General Risks and Complications: These are associated to most interventional treatments. They can occur alone, or in combination. They fall under one of the following six (6) categories: no benefit or worsening of symptoms; bleeding; infection; nerve damage; allergic reactions; and/or death. No benefits or worsening of symptoms: In Medicine there are no guarantees, only probabilities. No healthcare provider can ever guarantee that a medical treatment will work, they can only state the probability that it may. Furthermore, there is always the possibility that the condition may worsen, either directly, or indirectly, as a consequence of the treatment. Bleeding: This is more common if the patient is taking a blood thinner, either prescription or over the counter (example: Goody Powders, Fish oil, Aspirin , Garlic, etc.), or if suffering a condition associated with impaired coagulation (example: Hemophilia, cirrhosis of the liver, low platelet counts, etc.). However, even if you do not have one on these, it can still happen. If you have any of these conditions, or take one of these drugs, make sure to notify your treating physician. Infection: This is more common in patients with a compromised immune system, either due to disease (example: diabetes, cancer, human immunodeficiency virus [HIV], etc.), or due to medications or treatments (example: therapies used to treat cancer and rheumatological diseases). However, even if you do not have one on these, it can still happen. If you have any of these conditions, or take one of these drugs, make sure to notify your treating physician. Nerve Damage: This is more  common when the treatment is an  invasive one, but it can also happen with the use of medications, such as those used in the treatment of cancer. The damage can occur to small secondary nerves, or to large primary ones, such as those in the spinal cord and brain. This damage may be temporary or permanent and it may lead to impairments that can range from temporary numbness to permanent paralysis and/or brain death. Allergic Reactions: Any time a substance or material comes in contact with our body, there is the possibility of an allergic reaction. These can range from a mild skin rash (contact dermatitis) to a severe systemic reaction (anaphylactic reaction), which can result in death. Death: In general, any medical intervention can result in death, most of the time due to an unforeseen complication. ______________________________________________________________________     Lateral Femoral Cutaneous Nerve Block Patient Information  Description: The lateral femoral cutaneous nerve of the thigh is a purely sensory nerve that can become entrapped or irritated for a number of reasons.  The pain associated with this condition is called meralgia paraesthetica.  Patients affected with this syndrome have burning pain or abnormal sensation along the lateral aspect of the thigh.  The pain can be worsened by prolonged walking, standing, or constrictive garments around the house.   The diagnosis can be confirmed and treatment initiated by blocking the nerve with local anesthetic (like Novocaine).  At times, a steroid solution may be injected at the same time.  The site of injection is through a tiny needle in the left, lower quadrant of the abdomen.   The entire block usually lasts less than 5 minutes.  Conditions which may be treated by lateral femoral cutaneous nerve block:  Meralagia paraesthetica  Preparation for the injection:  Do not eat any solid food or dairy products within 8 hours of your appointment.  You may drink clear liquids up  to 3 hours before appointment.  Clear liquids include water , black coffee, juice or soda. No milk or cream please. You may take your regular medication, including pain medications, with a sip of water  before your appointment.  Diabetics should hold regular insulin (if taken separately) and take 1/2 normal NPH dose the morning of the procedure.  Carry some sugar containing items with you to your appointment. A driver must accompany you and be prepared to drive you home after your procedure. Bring all you current medications with you An IV may be inserted and sedation may be given at the discretion of the physician. A blood pressure cuff, EKG and other monitors will often be applied during the procedure.  Some patients may need to have extra oxygen  administered for a short period. You will be asked to provide medical information, including your allergies and medications, prior to the procedure.  We must know immediately if you are taking blood thinners (like Coumadin/Warfarin) or if you allergic to IV iodine contrast (dye)  We must know if you could possible be pregnant.   Possible side-effects:  Bleeding from needle site Infection (rate, may require surgery) Nerve injury (rare) Numbness and Tingling (temporary) Light-headedness (temporary) Pain at injection site (several day) Decreased blood pressure (rare, temporary) Weakness in leg (temporary)  Call if you experience: Hives or difficulty breathing (go to the emergency room) Inflammation or drainage at the injection site(s)  Please note:  Although the local anesthetic injected can often make your leg feel good for several hours after the injection,  The pain may return.  It takes  3-7 days for steroids to work.  You may not notice any pain relief for at least one week.  If effective, we will often do a series of injections spaced 3-6 weeks apart to maximally decrease your pain.  If you have any questions, please cll [phone]  [phone] [facility]  Sacroiliac (SI) Joint Injection Patient Information  Description: The sacroiliac joint connects the scrum (very low back and tailbone) to the ilium (a pelvic bone which also forms half of the hip joint).  Normally this joint experiences very little motion.  When this joint becomes inflamed or unstable low back and or hip and pelvis pain may result.  Injection of this joint with local anesthetics (numbing medicines) and steroids can provide diagnostic information and reduce pain.  This injection is performed with the aid of x-ray guidance into the tailbone area while you are lying on your stomach.   You may experience an electrical sensation down the leg while this is being done.  You may also experience numbness.  We also may ask if we are reproducing your normal pain during the injection.  Conditions which may be treated SI injection:  Low back, buttock, hip or leg pain  Preparation for the Injection:  Do not eat any solid food or dairy products within 8 hours of your appointment.  You may drink clear liquids up to 3 hours before appointment.  Clear liquids include water , black coffee, juice or soda.  No milk or cream please. You may take your regular medications, including pain medications with a sip of water  before your appointment.  Diabetics should hold regular insulin (if take separately) and take 1/2 normal NPH dose the morning of the procedure.  Carry some sugar containing items with you to your appointment. A driver must accompany you and be prepared to drive you home after your procedure. Bring all of your current medications with you. An IV may be inserted and sedation may be given at the discretion of the physician. A blood pressure cuff, EKG and other monitors will often be applied during the procedure.  Some patients may need to have extra oxygen  administered for a short period.  You will be asked to provide medical  information, including your allergies, prior to the procedure.  We must know immediately if you are taking blood thinners (like Coumadin/Warfarin) or if you are allergic to IV iodine contrast (dye).  We must know if you could possible be pregnant.  Possible side effects:  Bleeding from needle site Infection (rare, may require surgery) Nerve injury (rare) Numbness & tingling (temporary) A brief convulsion or seizure Light-headedness (temporary) Pain at injection site (several days) Decreased blood pressure (temporary) Weakness in the leg (temporary)   Call if you experience:  New onset weakness or numbness of an extremity below the injection site that last more than 8 hours. Hives or difficulty breathing ( go to the emergency room) Inflammation or drainage at the injection site Any new symptoms which are concerning to you  Please note:  Although the local anesthetic injected can often make your back/ hip/ buttock/ leg feel good for several hours after the injections, the pain will likely return.  It takes 3-7 days for steroids to work in the sacroiliac area.  You may not notice any pain relief for at least that one week.  If effective, we will often do a series of three injections spaced 3-6 weeks apart to maximally decrease your pain.  After the initial  series, we generally will wait some months before a repeat injection of the same type.  If you have any questions, please call [phone] [facility]

## 2024-03-14 NOTE — Progress Notes (Signed)
 Nursing Pain Medication Assessment:  Safety precautions to be maintained throughout the outpatient stay will include: orient to surroundings, keep bed in low position, maintain call bell within reach at all times, provide assistance with transfer out of bed and ambulation.  Medication Inspection Compliance: Rebecca Gilmore did not comply with our request to bring her pills to be counted. She was reminded that bringing the medication bottles, even when empty, is a requirement.  Medication: None brought in. Pill/Patch Count: None available to be counted. Bottle Appearance: No container available. Did not bring bottle(s) to appointment. Filled Date: N/A Last Medication intake:  Ran out of medicine more than 48 hours ago

## 2024-03-22 ENCOUNTER — Ambulatory Visit: Admitting: Student in an Organized Health Care Education/Training Program

## 2024-03-22 ENCOUNTER — Ambulatory Visit
Admission: RE | Admit: 2024-03-22 | Discharge: 2024-03-22 | Disposition: A | Discharge status: Home / Self Care | Source: Ambulatory Visit | Attending: Student in an Organized Health Care Education/Training Program | Admitting: Student in an Organized Health Care Education/Training Program

## 2024-03-22 ENCOUNTER — Encounter: Payer: Self-pay | Admitting: Student in an Organized Health Care Education/Training Program

## 2024-03-22 VITALS — BP 117/56 | HR 70 | Temp 97.2°F | Resp 16 | Ht 62.0 in | Wt 210.0 lb

## 2024-03-22 DIAGNOSIS — G5712 Meralgia paresthetica, left lower limb: Secondary | ICD-10-CM | POA: Diagnosis not present

## 2024-03-22 DIAGNOSIS — M533 Sacrococcygeal disorders, not elsewhere classified: Secondary | ICD-10-CM | POA: Diagnosis not present

## 2024-03-22 DIAGNOSIS — M461 Sacroiliitis, not elsewhere classified: Secondary | ICD-10-CM

## 2024-03-22 DIAGNOSIS — G8929 Other chronic pain: Secondary | ICD-10-CM

## 2024-03-22 DIAGNOSIS — G5702 Lesion of sciatic nerve, left lower limb: Secondary | ICD-10-CM

## 2024-03-22 DIAGNOSIS — M5412 Radiculopathy, cervical region: Secondary | ICD-10-CM

## 2024-03-22 MED ORDER — LIDOCAINE HCL 2 % IJ SOLN
20.0000 mL | Freq: Once | INTRAMUSCULAR | Status: AC
  Administered 2024-03-22: 400 mg
  Filled 2024-03-22: qty 40

## 2024-03-22 MED ORDER — ROPIVACAINE HCL 2 MG/ML IJ SOLN
9.0000 mL | Freq: Once | INTRAMUSCULAR | Status: AC
  Administered 2024-03-22: 9 mL via PERINEURAL
  Filled 2024-03-22: qty 20

## 2024-03-22 MED ORDER — DIAZEPAM 5 MG PO TABS
ORAL_TABLET | ORAL | Status: AC
  Filled 2024-03-22: qty 1

## 2024-03-22 MED ORDER — IOHEXOL 180 MG/ML  SOLN
10.0000 mL | Freq: Once | INTRAMUSCULAR | Status: AC
  Administered 2024-03-22: 10 mL via INTRA_ARTICULAR
  Filled 2024-03-22: qty 20

## 2024-03-22 MED ORDER — METHYLPREDNISOLONE ACETATE 80 MG/ML IJ SUSP
80.0000 mg | Freq: Once | INTRAMUSCULAR | Status: AC
  Administered 2024-03-22: 80 mg via INTRA_ARTICULAR
  Filled 2024-03-22: qty 1

## 2024-03-22 MED ORDER — DIAZEPAM 5 MG PO TABS
5.0000 mg | ORAL_TABLET | ORAL | Status: AC
  Administered 2024-03-22: 5 mg via ORAL

## 2024-03-22 MED ORDER — DEXAMETHASONE SOD PHOSPHATE PF 10 MG/ML IJ SOLN
10.0000 mg | Freq: Once | INTRAMUSCULAR | Status: AC
  Administered 2024-03-22: 10 mg
  Filled 2024-03-22: qty 1

## 2024-03-22 NOTE — Progress Notes (Signed)
 PROVIDER NOTE: Interpretation of information contained herein should be left to medically-trained personnel. Specific patient instructions are provided elsewhere under Patient Instructions section of medical record. This document was created in part using STT-dictation technology, any transcriptional errors that may result from this process are unintentional.  Patient: Rebecca Gilmore Type: Established DOB: [birth year] MRN: [id number] PCP: Stephanie Charlene CROME, MD  Service: Procedure DOS: 03/22/2024 Setting: Ambulatory Location: Ambulatory outpatient facility Delivery: Face-to-face Provider: Wallie Sherry, MD Specialty: Interventional Pain Management Specialty designation: 09 Location: Outpatient facility Ref. Prov.: Hamrick, Charlene CROME, MD       Interventional Therapy   Procedure: Sacroiliac Joint Steroid Injection #1   and Left Piriformis TPI #1 Laterality: Left     Level: PIIS (Posterior Inferior Iliac Spine)  Target: Interarticular sacroiliac joint. Location: Medial to the postero-medial edge of iliac spine. Region: Lumbosacral-sacrococcygeal. Approach: Inferior postero-medial percutaneous approach. Type of procedure: Percutaneous joint injection.  Imaging: Fluoroscopy-guided Non-spinal ([id number]) Anesthesia: Local anesthesia (1-2% Lidocaine ) Sedation: Minimal Sedation                       DOS: 03/22/2024  Performed by: Wallie Sherry, MD  Purpose: Diagnostic/Therapeutic Indications: Sacroiliac joint pain in the lower back and hip area severe enough to impact quality of life or function. Rationale (medical necessity): procedure needed and proper for the diagnosis and/or treatment of Ms. Top's medical symptoms and needs. 1. Chronic left SI joint pain   2. SI joint arthritis   3. SI (sacroiliac) joint inflammation   4. Meralgia paraesthetica, left   5. Cervical radicular pain    NAS-11 Pain score:   Pre-procedure: 10-Worst pain ever/10   Post-procedure: 2  (left thigh)/10       Position / Prep / Materials:  Position: Prone  Prep solution: ChloraPrep (2% chlorhexidine  gluconate and 70% isopropyl alcohol) Prep Area: Entire posterior lumbosacral area  Materials:  Tray: Block Needle(s):  Type: Spinal  Gauge (G): 22  Length: 5-in Qty: One (1) per procedure side.  H&P (Pre-op Assessment):  Ms. Burkel is a 84 y.o. (year old), female patient, seen today for interventional treatment. 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 Ms. Bacorn has a current medication list which includes the following prescription(s): acetaminophen , amitriptyline , cholecalciferol , clopidogrel , iron , hydrocodone -acetaminophen , [START ON 04/13/2024] hydrocodone -acetaminophen , lidocaine , loratadine , nitroglycerin, nystatin, pantoprazole , pregabalin , rosuvastatin , sertraline , and torsemide . Her primarily concern today is the Back Pain (Left, lower)  Initial Vital Signs:  Pulse/HCG Rate: 70ECG Heart Rate: 71 Temp: (!) 97.2 F (36.2 C) Resp: 18 BP: (!) 85/47 SpO2: 100 %  BMI: Estimated body mass index is 38.41 kg/m as calculated from the following:   Height as of this encounter: 5' 2 (1.575 m).   Weight as of this encounter: 210 lb (95.3 kg).  Risk Assessment: Allergies: Reviewed. She is allergic to chlorhexidine , morphine , other, morphine  and codeine, penicillins, latex, and povidone-iodine.  Allergy Precautions: None required Coagulopathies: Reviewed. None identified.  Blood-thinner therapy: None at this  time Active Infection(s): Reviewed. None identified. Ms. Tripathi is afebrile  Site Confirmation: Ms. Gilles was asked to confirm the procedure and laterality before marking the site Procedure checklist: Completed Consent: Before  the procedure and under the influence of no sedative(s), amnesic(s), or anxiolytics, the patient was informed of the treatment options, risks and possible complications. To fulfill our ethical and legal obligations, as recommended by the American Medical Association's Code of Ethics, I have informed the patient of my clinical impression; the nature and purpose of the treatment or procedure; the risks, benefits, and possible complications of the intervention; the alternatives, including doing nothing; the risk(s) and benefit(s) of the alternative treatment(s) or procedure(s); and the risk(s) and benefit(s) of doing nothing. The patient was provided information about the general risks and possible complications associated with the procedure. These may include, but are not limited to: failure to achieve desired goals, infection, bleeding, organ or nerve damage, allergic reactions, paralysis, and death. In addition, the patient was informed of those risks and complications associated to the procedure, such as failure to decrease pain; infection; bleeding; organ or nerve damage with subsequent damage to sensory, motor, and/or autonomic systems, resulting in permanent pain, numbness, and/or weakness of one or several areas of the body; allergic reactions; (i.e.: anaphylactic reaction); and/or death. Furthermore, the patient was informed of those risks and complications associated with the medications. These include, but are not limited to: allergic reactions (i.e.: anaphylactic or anaphylactoid reaction(s)); adrenal axis suppression; blood sugar elevation that in diabetics may result in ketoacidosis or comma; water  retention that in patients with history of congestive heart failure may  result in shortness of breath, pulmonary edema, and decompensation with resultant heart failure; weight gain; swelling or edema; medication-induced neural toxicity; particulate matter embolism and blood vessel occlusion with resultant organ, and/or nervous system infarction; and/or aseptic necrosis of one or more joints. Finally, the patient was informed that Medicine is not an exact science; therefore, there is also the possibility of unforeseen or unpredictable risks and/or possible complications that may result in a catastrophic outcome. The patient indicated having understood very clearly. We have given the patient no guarantees and we have made no promises. Enough time was given to the patient to ask questions, all of which were answered to the patient's satisfaction. Ms. Lichtenwalner has indicated that she wanted to continue with the procedure. Attestation: I, the ordering provider, attest that I have discussed with the patient the benefits, risks, side-effects, alternatives, likelihood of achieving goals, and potential problems during recovery for the procedure that I have provided informed consent. Date  Time: 03/22/2024 11:31 AM  Pre-Procedure Preparation:  Monitoring: As per clinic protocol. Respiration, ETCO2, SpO2, BP, heart rate and rhythm monitor placed and checked for adequate function Safety Precautions: Patient was assessed for positional comfort and pressure points before starting the procedure. Time-out: I initiated and conducted the Time-out before starting the procedure, as per protocol. The patient was asked to participate by confirming the accuracy of the Time Out information. Verification of the correct person, site, and procedure were performed and confirmed by me, the nursing staff, and the patient. Time-out conducted as per Joint Commission's Universal Protocol (UP.01.01.01). Time: 1207 Start Time: 1207 hrs.  Description/Narrative of Procedure:          Start Time: 1207  hrs.  Rationale (medical necessity): procedure needed and proper for the diagnosis and/or treatment of the patient's medical symptoms and needs. Procedural Technique Safety Precautions: Aspiration looking for blood return was conducted prior to all injections. At no point did we inject any substances, as a needle was being advanced. No attempts were made at seeking any paresthesias. Safe injection practices and needle disposal techniques used.  Medications properly checked for expiration dates. SDV (single dose vial) medications used. Description of the Procedure: Protocol guidelines were followed. The patient was assisted into a comfortable position. The target area was identified and the area prepped in the usual manner. Skin & deeper tissues infiltrated with local anesthetic. Appropriate amount of time allowed to pass for local anesthetics to take effect. The procedure needles were then advanced to the target area. Proper needle placement secured. Negative aspiration confirmed. Solution injected in intermittent fashion, asking for systemic symptoms every 0.5cc of injectate. The needles were then removed and the area cleansed, making sure to leave some of the prepping solution back to take advantage of its long term bactericidal properties.  Technical description of procedure:  10 cc solution made of 9 cc of 0.2% ropivacaine , 1 cc of methylprednisolone , 80 mg/cc.  5 cc injected into the left SI joint.    Afterwards a left piriformis trigger point injection was done 1 cm inferior, 1 cm deep, 1 cm lateral to the inferior fissure of the SI joint.  Contrast was injected to confirm piriformis muscle striation.  While injecting, patient did not complain of any pain radiating down her leg.  5 cc injected into the left piriformis.      Vitals:   03/22/24 1207 03/22/24 1212 03/22/24 1219 03/22/24 1226  BP: (!) 98/59 90/76 134/89 (!) 117/56  Pulse:    70  Resp: 18 15 15 16   Temp:      TempSrc:      SpO2:  98% 98% 97% 100%  Weight:      Height:         End Time: 1218 hrs.  Imaging Guidance (Non-Spinal):          Type of Imaging Technique: Fluoroscopy Guidance (Non-Spinal) Indication(s): Fluoroscopy guidance for needle placement to enhance accuracy in procedures requiring precise needle localization for targeted delivery of medication in or near specific anatomical locations not easily accessible without such real-time imaging assistance. Exposure Time: Please see nurses notes. Contrast: Before injecting any contrast, we confirmed that the patient did not have an allergy to iodine, shellfish, or radiological contrast. Once satisfactory needle placement was completed at the desired level, radiological contrast was injected. Contrast injected under live fluoroscopy. No contrast complications. See chart for type and volume of contrast used. Fluoroscopic Guidance: I was personally present during the use of fluoroscopy. Tunnel Vision Technique used to obtain the best possible view of the target area. Parallax error corrected before commencing the procedure. Direction-depth-direction technique used to introduce the needle under continuous pulsed fluoroscopy. Once target was reached, antero-posterior, oblique, and lateral fluoroscopic projection used confirm needle placement in all planes. Images permanently stored in EMR. Interpretation: I personally interpreted the imaging intraoperatively. Adequate needle placement confirmed in multiple planes. Appropriate spread of contrast into desired area was observed. No evidence of afferent or efferent intravascular uptake. Permanent images saved into the patient's record.  Post-operative Assessment:  Post-procedure Vital Signs:  Pulse/HCG Rate: 7071 Temp: (!) 97.2 F (36.2 C) Resp: 16 BP: (!) 117/56 SpO2: 100 %  EBL: None  Complications: No immediate post-treatment complications observed by team, or reported by patient.  Note: The patient tolerated  the entire procedure well. A repeat set of vitals were taken after the procedure and the patient was kept under observation following institutional policy, for this type of procedure. Post-procedural neurological assessment was performed, showing return to baseline, prior to discharge. The patient was provided with post-procedure discharge instructions, including a section  on how to identify potential problems. Should any problems arise concerning this procedure, the patient was given instructions to immediately contact us , at any time, without hesitation. In any case, we plan to contact the patient by telephone for a follow-up status report regarding this interventional procedure.  Comments:  No additional relevant information.  Plan of Care (POC)  Orders:  Orders Placed This Encounter  Procedures   DG PAIN CLINIC C-ARM 1-60 MIN NO REPORT    Intraoperative interpretation by procedural physician at [facility].    Standing Status:   Standing    Number of Occurrences:   1    Reason for exam::   Assistance in needle guidance and placement for procedures requiring needle placement in or near specific anatomical locations not easily accessible without such assistance.       Medications ordered for procedure: Meds ordered this encounter  Medications   lidocaine  (XYLOCAINE ) 2 % (with pres) injection 400 mg   diazepam  (VALIUM ) tablet 5 mg    Make sure Flumazenil is available in the pyxis when using this medication. If oversedation occurs, administer 0.2 mg IV over 15 sec. If after 45 sec no response, administer 0.2 mg again over 1 min; may repeat at 1 min intervals; not to exceed 4 doses (1 mg)   ropivacaine  (PF) 2 mg/mL (0.2%) (NAROPIN ) injection 9 mL   dexamethasone  (DECADRON ) injection 10 mg   methylPREDNISolone  acetate (DEPO-MEDROL ) injection 80 mg   iohexol  (OMNIPAQUE ) 180 MG/ML injection 10 mL    Must be Myelogram-compatible. If not available, you may substitute with a  water -soluble, non-ionic, hypoallergenic, myelogram-compatible radiological contrast medium.   Medications administered: We administered lidocaine , diazepam , ropivacaine  (PF) 2 mg/mL (0.2%), dexamethasone , methylPREDNISolone  acetate, and iohexol .  See the medical record for exact dosing, route, and time of administration.    Follow-up plan:   Return in about 4 weeks (around 04/19/2024) for PPE, F2F, Seema.     Recent Visits Date Type Provider Dept  03/14/24 Office Visit Marcelino Nurse, MD [facility]  01/11/24 Office Visit Marcelino Nurse, MD [facility]  Showing recent visits within past 90 days and meeting all other requirements Today's Visits Date Type Provider Dept  03/22/24 Procedure visit Marcelino Nurse, MD [facility]  Showing today's visits and meeting all other requirements Future Appointments Date Type Provider Dept  04/17/24 Appointment Patel, Seema K, NP [facility]  Showing future appointments within next 90 days and meeting all other requirements   Disposition: Discharge home  Discharge (Date  Time): 03/22/2024; 1229 hrs.   Primary Care Physician: Stephanie Charlene CROME, MD Location: [facility] Note by: Nurse Marcelino, MD (TTS technology used. I apologize for any typographical errors that were not detected and corrected.) Date: 03/22/2024; Time: 1:15 PM  Disclaimer:  Medicine is not an visual merchandiser. The only guarantee in medicine is that nothing is guaranteed. It is important to note that the decision to proceed with this intervention was based on the information collected from the patient. The Data and conclusions were drawn from the patient's questionnaire, the interview, and the physical examination. Because the information was provided in large part by the patient, it cannot be guaranteed that it has not been purposely or unconsciously manipulated. Every effort has been made to obtain as much relevant data  as possible for this evaluation. It is important to note that the conclusions that lead to this procedure are derived in large part from the available data. Always  take into account that the treatment will also be dependent on availability of resources and existing treatment guidelines, considered by other Pain Management Practitioners as being common knowledge and practice, at the time of the intervention. For Medico-Legal purposes, it is also important to point out that variation in procedural techniques and pharmacological choices are the acceptable norm. The indications, contraindications, technique, and results of the above procedure should only be interpreted and judged by a Board-Certified Interventional Pain Specialist with extensive familiarity and expertise in the same exact procedure and technique.

## 2024-03-22 NOTE — Patient Instructions (Signed)

## 2024-03-22 NOTE — Progress Notes (Signed)
 PROVIDER NOTE: Interpretation of information contained herein should be left to medically-trained personnel. Specific patient instructions are provided elsewhere under Patient Instructions section of medical record. This document was created in part using STT-dictation technology, any transcriptional errors that may result from this process are unintentional.  Patient: Rebecca Gilmore Type: Established DOB: [birth year] MRN: [id number] PCP: Stephanie Charlene CROME, MD  Service: Procedure DOS: 03/22/2024 Setting: Ambulatory Location: Ambulatory outpatient facility Delivery: Face-to-face Provider: Wallie Sherry, MD Specialty: Interventional Pain Management Specialty designation: 09 Location: Outpatient facility Ref. Prov.: Hamrick, Charlene CROME, MD       Interventional Therapy   Primary Reason for Visit: Interventional Pain Management Treatment. CC: Back Pain (Left, lower)    Procedure:          Anesthesia, Analgesia, Anxiolysis:  Type: Left lateral femoral cutaneous nerve block at the anterior superior iliac spine under US  #2  Anesthesia: Local (1-2% Lidocaine )  Anxiolysis: None  Sedation: None  Guidance: Ultrasound           Position: Supine   1. Chronic left SI joint pain   2. SI joint arthritis   3. SI (sacroiliac) joint inflammation   4. Meralgia paraesthetica, left   5. Cervical radicular pain    NAS-11 Pain score:   Pre-procedure: 10-Worst pain ever/10   Post-procedure: 2  (left thigh)/10     H&P (Pre-op Assessment):  Rebecca Gilmore is a 84 y.o. (year old), female patient, seen today for interventional treatment. 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 Rebecca Gilmore has a current medication list which includes the following prescription(s): acetaminophen , amitriptyline , cholecalciferol , clopidogrel , iron , hydrocodone -acetaminophen , [START ON 04/13/2024] hydrocodone -acetaminophen , lidocaine , loratadine , nitroglycerin, nystatin, pantoprazole , pregabalin , rosuvastatin , sertraline , and torsemide . Her primarily concern today is the Back Pain (Left, lower)  Initial Vital Signs:  Pulse/HCG Rate: 70ECG Heart Rate: 71 Temp: (!) 97.2 F (36.2 C) Resp: 18 BP: (!) 85/47 SpO2: 100 %  BMI: Estimated body mass index is 38.41 kg/m as calculated from the following:   Height as of this encounter: 5' 2 (1.575 m).   Weight as of this encounter: 210 lb (95.3 kg).  Risk Assessment: Allergies: Reviewed. She is allergic to chlorhexidine , morphine , other, morphine  and codeine, penicillins, latex, and povidone-iodine.  Allergy Precautions: None required Coagulopathies: Reviewed. None identified.  Blood-thinner therapy: None at this time Active Infection(s): Reviewed. None identified. Rebecca Gilmore is afebrile  Site Confirmation: Rebecca Gilmore was asked to confirm the procedure and laterality before marking the site Procedure checklist: Completed Consent: Before the procedure and under the influence of no sedative(s), amnesic(s), or anxiolytics, the patient was informed of the treatment options, risks and possible complications. To fulfill our ethical and legal obligations, as recommended by the American Medical Association's Code of Ethics, I have informed the patient of my clinical impression; the nature and purpose of the treatment or procedure; the risks, benefits, and possible complications of the intervention; the alternatives, including doing nothing; the risk(s) and benefit(s) of the alternative treatment(s) or  procedure(s); and the risk(s) and benefit(s) of doing nothing. The patient was provided information about the general risks and possible complications associated with the procedure. These may include, but are  not limited to: failure to achieve desired goals, infection, bleeding, organ or nerve damage, allergic reactions, paralysis, and death. In addition, the patient was informed of those risks and complications associated to the procedure, such as failure to decrease pain; infection; bleeding; organ or nerve damage with subsequent damage to sensory, motor, and/or autonomic systems, resulting in permanent pain, numbness, and/or weakness of one or several areas of the body; allergic reactions; (i.e.: anaphylactic reaction); and/or death. Furthermore, the patient was informed of those risks and complications associated with the medications. These include, but are not limited to: allergic reactions (i.e.: anaphylactic or anaphylactoid reaction(s)); adrenal axis suppression; blood sugar elevation that in diabetics may result in ketoacidosis or comma; water  retention that in patients with history of congestive heart failure may result in shortness of breath, pulmonary edema, and decompensation with resultant heart failure; weight gain; swelling or edema; medication-induced neural toxicity; particulate matter embolism and blood vessel occlusion with resultant organ, and/or nervous system infarction; and/or aseptic necrosis of one or more joints. Finally, the patient was informed that Medicine is not an exact science; therefore, there is also the possibility of unforeseen or unpredictable risks and/or possible complications that may result in a catastrophic outcome. The patient indicated having understood very clearly. We have given the patient no guarantees and we have made no promises. Enough time was given to the patient to ask questions, all of which were answered to the patient's satisfaction. Rebecca Gilmore has  indicated that she wanted to continue with the procedure. Attestation: I, the ordering provider, attest that I have discussed with the patient the benefits, risks, side-effects, alternatives, likelihood of achieving goals, and potential problems during recovery for the procedure that I have provided informed consent. Date  Time: 03/22/2024 11:31 AM  Pre-Procedure Preparation:  Monitoring: As per clinic protocol. Respiration, ETCO2, SpO2, BP, heart rate and rhythm monitor placed and checked for adequate function Safety Precautions: Patient was assessed for positional comfort and pressure points before starting the procedure. Time-out: I initiated and conducted the Time-out before starting the procedure, as per protocol. The patient was asked to participate by confirming the accuracy of the Time Out information. Verification of the correct person, site, and procedure were performed and confirmed by me, the nursing staff, and the patient. Time-out conducted as per Joint Commission's Universal Protocol (UP.01.01.01). Time: 1207 Start Time: 1207 hrs.  Description of Procedure:          Area Prepped: Entire Anterolateral hip area. Lateral Femoral Cutaneous Nerve (LFCN) Block: The transducer was moved laterally and superior to the anterior superior iliac spine (ASIS). The LFCN was identified as a hyperechoic structure traveling under the fascia lata, medial and inferior to the ASIS. Using in-plane technique, a 22-gauge echogenic needle was advanced toward the LFCN. After negative aspiration, 5 mL of solution containing 4cc of 0.2% Ropivacaine  and 1 cc of Decadron  10 mg/cc was injected, visualizing spread around the LFC nerve.    Vitals:   03/22/24 1207 03/22/24 1212 03/22/24 1219 03/22/24 1226  BP: (!) 98/59 90/76 134/89 (!) 117/56  Pulse:    70  Resp: 18 15 15 16   Temp:      TempSrc:      SpO2: 98% 98% 97% 100%  Weight:      Height:        Start Time: 1207 hrs. End Time: 1218  hrs.     Materials:  Needle(s) Type: Regular needle Gauge: 22G Length: 1.5-in    Antibiotic Prophylaxis:   Anti-infectives (  From admission, onward)    None      Indication(s): None identified   Post-operative Assessment:  Post-procedure Vital Signs:  Pulse/HCG Rate: 7071 Temp: (!) 97.2 F (36.2 C) Resp: 16 BP: (!) 117/56 SpO2: 100 %  EBL: None  Complications: No immediate post-treatment complications observed by team, or reported by patient.  Note: The patient tolerated the entire procedure well. A repeat set of vitals were taken after the procedure and the patient was kept under observation following institutional policy, for this type of procedure. Post-procedural neurological assessment was performed, showing return to baseline, prior to discharge. The patient was provided with post-procedure discharge instructions, including a section on how to identify potential problems. Should any problems arise concerning this procedure, the patient was given instructions to immediately contact us , at any time, without hesitation. In any case, we plan to contact the patient by telephone for a follow-up status report regarding this interventional procedure.  Comments:  No additional relevant information.  Plan of Care (POC)  Orders:  Orders Placed This Encounter  Procedures   DG PAIN CLINIC C-ARM 1-60 MIN NO REPORT    Intraoperative interpretation by procedural physician at [facility].    Standing Status:   Standing    Number of Occurrences:   1    Reason for exam::   Assistance in needle guidance and placement for procedures requiring needle placement in or near specific anatomical locations not easily accessible without such assistance.     Medications ordered for procedure: Meds ordered this encounter  Medications   lidocaine  (XYLOCAINE ) 2 % (with pres) injection 400 mg   diazepam  (VALIUM ) tablet 5 mg    Make sure Flumazenil is available in the pyxis when  using this medication. If oversedation occurs, administer 0.2 mg IV over 15 sec. If after 45 sec no response, administer 0.2 mg again over 1 min; may repeat at 1 min intervals; not to exceed 4 doses (1 mg)   ropivacaine  (PF) 2 mg/mL (0.2%) (NAROPIN ) injection 9 mL   dexamethasone  (DECADRON ) injection 10 mg   methylPREDNISolone  acetate (DEPO-MEDROL ) injection 80 mg   iohexol  (OMNIPAQUE ) 180 MG/ML injection 10 mL    Must be Myelogram-compatible. If not available, you may substitute with a water -soluble, non-ionic, hypoallergenic, myelogram-compatible radiological contrast medium.   Medications administered: We administered lidocaine , diazepam , ropivacaine  (PF) 2 mg/mL (0.2%), dexamethasone , methylPREDNISolone  acetate, and iohexol .  See the medical record for exact dosing, route, and time of administration.   Follow-up plan:   Return in about 4 weeks (around 04/19/2024) for PPE, F2F, Seema.     Recent Visits Date Type Provider Dept  03/14/24 Office Visit Marcelino Nurse, MD [facility]  01/11/24 Office Visit Marcelino Nurse, MD [facility]  Showing recent visits within past 90 days and meeting all other requirements Today's Visits Date Type Provider Dept  03/22/24 Procedure visit Marcelino Nurse, MD [facility]  Showing today's visits and meeting all other requirements Future Appointments Date Type Provider Dept  04/17/24 Appointment Patel, Seema K, NP [facility]  Showing future appointments within next 90 days and meeting all other requirements   Disposition: Discharge home  Discharge (Date  Time): 03/22/2024; 1229 hrs.   Primary Care Physician: Stephanie Charlene CROME, MD Location: [facility] Note by: Nurse Marcelino, MD (TTS technology used. I apologize for any typographical errors that were not detected and corrected.) Date: 03/22/2024; Time: 1:17 PM  Disclaimer:  Medicine is not an visual merchandiser. The  only guarantee in  medicine is that nothing is guaranteed. It is important to note that the decision to proceed with this intervention was based on the information collected from the patient. The Data and conclusions were drawn from the patient's questionnaire, the interview, and the physical examination. Because the information was provided in large part by the patient, it cannot be guaranteed that it has not been purposely or unconsciously manipulated. Every effort has been made to obtain as much relevant data as possible for this evaluation. It is important to note that the conclusions that lead to this procedure are derived in large part from the available data. Always take into account that the treatment will also be dependent on availability of resources and existing treatment guidelines, considered by other Pain Management Practitioners as being common knowledge and practice, at the time of the intervention. For Medico-Legal purposes, it is also important to point out that variation in procedural techniques and pharmacological choices are the acceptable norm. The indications, contraindications, technique, and results of the above procedure should only be interpreted and judged by a Board-Certified Interventional Pain Specialist with extensive familiarity and expertise in the same exact procedure and technique.

## 2024-03-22 NOTE — Addendum Note (Signed)
 Addended by: IGNACIO DELON BROCKS on: 03/22/2024 01:49 PM   Modules accepted: Orders

## 2024-03-23 ENCOUNTER — Telehealth: Payer: Self-pay

## 2024-03-23 ENCOUNTER — Encounter: Admitting: Student in an Organized Health Care Education/Training Program

## 2024-03-23 NOTE — Telephone Encounter (Signed)
 Post procedure follow up.  LM

## 2024-04-17 ENCOUNTER — Ambulatory Visit: Admitting: Nurse Practitioner

## 2024-04-18 ENCOUNTER — Ambulatory Visit: Admitting: Nurse Practitioner

## 2024-06-08 ENCOUNTER — Encounter (INDEPENDENT_AMBULATORY_CARE_PROVIDER_SITE_OTHER)

## 2024-06-08 ENCOUNTER — Ambulatory Visit (INDEPENDENT_AMBULATORY_CARE_PROVIDER_SITE_OTHER): Admitting: Nurse Practitioner

## 2025-01-09 ENCOUNTER — Inpatient Hospital Stay

## 2025-01-16 ENCOUNTER — Inpatient Hospital Stay

## 2025-01-16 ENCOUNTER — Inpatient Hospital Stay: Admitting: Oncology
